# Patient Record
Sex: Female | Born: 1950
Health system: Southern US, Community
[De-identification: ages and names within clinical notes are randomized; demographics above are authoritative.]

## PROBLEM LIST (undated history)

## (undated) DIAGNOSIS — D369 Benign neoplasm, unspecified site: Secondary | ICD-10-CM

## (undated) DIAGNOSIS — E785 Hyperlipidemia, unspecified: Secondary | ICD-10-CM

## (undated) DIAGNOSIS — I428 Other cardiomyopathies: Secondary | ICD-10-CM

## (undated) DIAGNOSIS — Z95810 Presence of automatic (implantable) cardiac defibrillator: Secondary | ICD-10-CM

## (undated) DIAGNOSIS — I10 Essential (primary) hypertension: Secondary | ICD-10-CM

## (undated) DIAGNOSIS — I5022 Chronic systolic (congestive) heart failure: Secondary | ICD-10-CM

## (undated) DIAGNOSIS — Z950 Presence of cardiac pacemaker: Secondary | ICD-10-CM

## (undated) DIAGNOSIS — Z860100 Personal history of colon polyps, unspecified: Secondary | ICD-10-CM

## (undated) DIAGNOSIS — I509 Heart failure, unspecified: Secondary | ICD-10-CM

## (undated) DIAGNOSIS — K635 Polyp of colon: Secondary | ICD-10-CM

## (undated) DIAGNOSIS — M109 Gout, unspecified: Secondary | ICD-10-CM

## (undated) DIAGNOSIS — I447 Left bundle-branch block, unspecified: Secondary | ICD-10-CM

## (undated) DIAGNOSIS — R7303 Prediabetes: Secondary | ICD-10-CM

## (undated) DIAGNOSIS — T7840XA Allergy, unspecified, initial encounter: Secondary | ICD-10-CM

## (undated) HISTORY — DX: Hyperlipidemia, unspecified: E78.5

## (undated) HISTORY — DX: Other cardiomyopathies: I42.8

## (undated) HISTORY — DX: Allergy, unspecified, initial encounter: T78.40XA

## (undated) HISTORY — DX: Personal history of colonic polyps: Z86.010

## (undated) HISTORY — DX: Heart failure, unspecified: I50.9

## (undated) HISTORY — DX: Prediabetes: R73.03

## (undated) HISTORY — DX: Benign neoplasm, unspecified site: D36.9

## (undated) HISTORY — DX: Personal history of colon polyps, unspecified: Z86.0100

## (undated) HISTORY — DX: Polyp of colon: K63.5

## (undated) HISTORY — DX: Chronic systolic (congestive) heart failure: I50.22

## (undated) HISTORY — DX: Left bundle-branch block, unspecified: I44.7

---

## 1979-01-30 HISTORY — PX: PARTIAL HYSTERECTOMY: SHX80

## 1997-05-31 DIAGNOSIS — D369 Benign neoplasm, unspecified site: Secondary | ICD-10-CM

## 1997-05-31 HISTORY — DX: Benign neoplasm, unspecified site: D36.9

## 1998-05-19 ENCOUNTER — Other Ambulatory Visit: Admission: RE | Admit: 1998-05-19 | Discharge: 1998-05-19 | Payer: Self-pay | Admitting: Obstetrics and Gynecology

## 1998-07-10 ENCOUNTER — Encounter: Payer: Self-pay | Admitting: Internal Medicine

## 1998-07-10 ENCOUNTER — Ambulatory Visit (HOSPITAL_COMMUNITY): Admission: RE | Admit: 1998-07-10 | Discharge: 1998-07-10 | Payer: Self-pay | Admitting: Internal Medicine

## 1998-07-17 ENCOUNTER — Ambulatory Visit (HOSPITAL_COMMUNITY): Admission: RE | Admit: 1998-07-17 | Discharge: 1998-07-17 | Payer: Self-pay | Admitting: Gastroenterology

## 1999-07-14 ENCOUNTER — Encounter: Payer: Self-pay | Admitting: Obstetrics and Gynecology

## 1999-07-14 ENCOUNTER — Ambulatory Visit (HOSPITAL_COMMUNITY): Admission: RE | Admit: 1999-07-14 | Discharge: 1999-07-14 | Payer: Self-pay | Admitting: Obstetrics and Gynecology

## 1999-07-14 ENCOUNTER — Other Ambulatory Visit: Admission: RE | Admit: 1999-07-14 | Discharge: 1999-07-14 | Payer: Self-pay | Admitting: Obstetrics and Gynecology

## 2000-07-15 ENCOUNTER — Other Ambulatory Visit: Admission: RE | Admit: 2000-07-15 | Discharge: 2000-07-15 | Payer: Self-pay | Admitting: Obstetrics and Gynecology

## 2000-07-15 ENCOUNTER — Encounter: Payer: Self-pay | Admitting: Internal Medicine

## 2000-07-15 ENCOUNTER — Ambulatory Visit (HOSPITAL_COMMUNITY): Admission: RE | Admit: 2000-07-15 | Discharge: 2000-07-15 | Payer: Self-pay | Admitting: Internal Medicine

## 2001-07-18 ENCOUNTER — Other Ambulatory Visit: Admission: RE | Admit: 2001-07-18 | Discharge: 2001-07-18 | Payer: Self-pay | Admitting: Obstetrics and Gynecology

## 2001-07-21 ENCOUNTER — Encounter: Payer: Self-pay | Admitting: Internal Medicine

## 2001-07-21 ENCOUNTER — Ambulatory Visit (HOSPITAL_COMMUNITY): Admission: RE | Admit: 2001-07-21 | Discharge: 2001-07-21 | Payer: Self-pay | Admitting: Internal Medicine

## 2001-10-03 ENCOUNTER — Ambulatory Visit (HOSPITAL_COMMUNITY): Admission: RE | Admit: 2001-10-03 | Discharge: 2001-10-03 | Payer: Self-pay | Admitting: Gastroenterology

## 2002-08-07 ENCOUNTER — Ambulatory Visit (HOSPITAL_COMMUNITY): Admission: RE | Admit: 2002-08-07 | Discharge: 2002-08-07 | Payer: Self-pay | Admitting: Internal Medicine

## 2002-08-07 ENCOUNTER — Encounter: Payer: Self-pay | Admitting: Internal Medicine

## 2003-08-09 ENCOUNTER — Ambulatory Visit (HOSPITAL_COMMUNITY): Admission: RE | Admit: 2003-08-09 | Discharge: 2003-08-09 | Payer: Self-pay | Admitting: Internal Medicine

## 2004-06-29 ENCOUNTER — Ambulatory Visit (HOSPITAL_COMMUNITY): Admission: RE | Admit: 2004-06-29 | Discharge: 2004-06-29 | Payer: Self-pay | Admitting: Gastroenterology

## 2004-09-11 ENCOUNTER — Ambulatory Visit (HOSPITAL_COMMUNITY): Admission: RE | Admit: 2004-09-11 | Discharge: 2004-09-11 | Payer: Self-pay | Admitting: Internal Medicine

## 2005-09-14 ENCOUNTER — Ambulatory Visit (HOSPITAL_COMMUNITY): Admission: RE | Admit: 2005-09-14 | Discharge: 2005-09-14 | Payer: Self-pay | Admitting: Internal Medicine

## 2006-10-04 ENCOUNTER — Ambulatory Visit (HOSPITAL_COMMUNITY): Admission: RE | Admit: 2006-10-04 | Discharge: 2006-10-04 | Payer: Self-pay | Admitting: Internal Medicine

## 2007-10-30 ENCOUNTER — Ambulatory Visit (HOSPITAL_COMMUNITY): Admission: RE | Admit: 2007-10-30 | Discharge: 2007-10-30 | Payer: Self-pay | Admitting: Internal Medicine

## 2008-11-04 ENCOUNTER — Ambulatory Visit (HOSPITAL_COMMUNITY): Admission: RE | Admit: 2008-11-04 | Discharge: 2008-11-04 | Payer: Self-pay | Admitting: Internal Medicine

## 2009-11-24 ENCOUNTER — Ambulatory Visit (HOSPITAL_COMMUNITY): Admission: RE | Admit: 2009-11-24 | Discharge: 2009-11-24 | Payer: Self-pay | Admitting: Internal Medicine

## 2010-10-16 NOTE — Op Note (Signed)
Cynthia Roth, Cynthia Roth             ACCOUNT NO.:  [id number]   MEDICAL RECORD NO.:  [id number]          PATIENT TYPE:  AMB   LOCATION:  ENDO                         FACILITY:  [facility]   PHYSICIAN:  Anselmo Rod, M.D.  DATE OF BIRTH:  [birth year]   DATE OF PROCEDURE:  06/29/2004  DATE OF DISCHARGE:                                 OPERATIVE REPORT   PROCEDURE PERFORMED:  Screening colonoscopy.   ENDOSCOPIST:  Anselmo Rod, M.D.   INSTRUMENT USED:  Olympus video colonoscope.   INDICATIONS FOR PROCEDURE:  A 60 year old, African-American female  undergoing a screening colonoscopy.  The patient has a personal history of  colonic polyps, and has had rectal bleeding off and on in the past.  She  also has a family history of colon cancer.  Rule out polyps, masses, etc.   PRE-PROCEDURE PREPARATION:  Informed consent was procured from the patient.  The patient was fasted for eight hours prior to the procedure and prepped  with a bottle of magnesium citrate and a gallon of GoLYTELY the night prior  to the procedure.   PRE-PROCEDURE PHYSICAL:  VITAL SIGNS:  The patient had stable vital signs.  NECK:  Supple.  CHEST:  Clear to auscultation.  CARDIOVASCULAR:  S1, S2 regular.  ABDOMEN:  Soft, with normal bowel sounds.   DESCRIPTION OF THE PROCEDURE:  The patient was placed in the left lateral  decubitus position, sedated with 75 mg of Demerol and 7.5 mg of Versed in  slow incremental doses.  Once the patient was adequately sedated and  maintained on low-flow oxygen and continuous cardiac monitoring, the Olympus  video colonoscope was advanced from the rectum to the cecum.  The  appendiceal orifice and the ileocecal valve were clearly visualized and  photographed.  The patient had an excellent prep.  No masses, polyps,  erosions, ulcerations, or diverticula were seen.  Retroflexion in the rectum  revealed no abnormalities.  The patient tolerated the procedure well,  without immediate  complication.   IMPRESSION:  Normal colonoscopy.  No masses, polyps, or diverticula seen.   RECOMMENDATIONS:  1.  Continue a high-fiber diet, with liberal fluid intake.  2.  Repeat colonoscopy in the next five years unless the patient develops      any abnormal symptoms in the interim.  3.  Outpatient followup as need arises in the future.      JNM/MEDQ  D:  06/30/2004  T:  06/30/2004  Job:  [id number]   cc:   Merlene Laughter. Renae Gloss, M.D.  [address]  [address]  [place]  [place] [address]  Fax: [phone]

## 2010-10-16 NOTE — Procedures (Signed)
[facility]  Patient:    Cynthia Roth, CENICEROS Visit Number: [id number] MRN: [id number]          Service Type: END Location: ENDO Attending Physician:  Charna Elizabeth Dictated by:   Anselmo Rod, M.D. Proc. Date: 10/03/01 Admit Date:  10/03/2001   CC:         Cala Bradford R. Renae Gloss, M.D.   Procedure Report  DATE OF BIRTH:  1950-11-13.  REFERRING PHYSICIAN:  Merlene Laughter. Renae Gloss, M.D.  PROCEDURE PERFORMED:  Colonoscopy.  ENDOSCOPIST:  Anselmo Rod, M.D.  INSTRUMENT USED:  Olympus video colonoscope.  INDICATIONS FOR PROCEDURE:  The patient is a 60 year old African-American female undergoing repeat colonoscopy because of previous history of colonic polyps (tubular adenoma removed in 1999).  PREPROCEDURE PREPARATION:  Informed consent was procured from the patient. The patient was fasted for eight hours prior to the procedure and prepped with a bottle of magnesium citrate and a gallon of NuLytely the night prior to the procedure.  PREPROCEDURE PHYSICAL:  The patient had stable vital signs.  Neck supple. Chest clear to auscultation.  S1, S2 regular.  Abdomen soft with normal bowel sounds.  DESCRIPTION OF PROCEDURE:  The patient was placed in the left lateral decubitus position and sedated with 80 mg of Demerol and 8 mg of Versed intravenously.  Once the patient was adequately sedated and maintained on low-flow oxygen and continuous cardiac monitoring, the Olympus video colonoscope was advanced from the rectum to the cecum without difficulty. No masses, polyps, erosions, ulcerations or diverticula were seen.  The appendiceal orifice and ileocecal valve were clearly visualized and photographed.  The patient tolerated the procedure well without complication.  IMPRESSION:  Normal colonoscopy.  RECOMMENDATIONS: 1. Repeat colorectal cancer screening is recommended in the next five years    considering the patients personal history of colonic  polyps. 2. If the patient were to develop any abnormal GI symptoms in the interim,    she is to return to the office immediately. 3. A high fiber diet has been recommended. 4. Avoidance of nonsteroidals has been encouraged.Dictated by:   Anselmo Rod, M.D. Attending Physician:  Charna Elizabeth DD:  10/03/01 TD:  10/04/01 Job: [id number]

## 2010-12-15 ENCOUNTER — Other Ambulatory Visit (HOSPITAL_COMMUNITY): Payer: Self-pay | Admitting: Internal Medicine

## 2010-12-15 DIAGNOSIS — Z1231 Encounter for screening mammogram for malignant neoplasm of breast: Secondary | ICD-10-CM

## 2010-12-28 ENCOUNTER — Ambulatory Visit (HOSPITAL_COMMUNITY)
Admission: RE | Admit: 2010-12-28 | Discharge: 2010-12-28 | Disposition: A | Discharge status: Home / Self Care | Payer: BC Managed Care – PPO | Source: Ambulatory Visit | Attending: Internal Medicine | Admitting: Internal Medicine

## 2010-12-28 DIAGNOSIS — Z1231 Encounter for screening mammogram for malignant neoplasm of breast: Secondary | ICD-10-CM | POA: Insufficient documentation

## 2011-11-04 ENCOUNTER — Other Ambulatory Visit (HOSPITAL_COMMUNITY): Payer: Self-pay | Admitting: Internal Medicine

## 2011-11-04 DIAGNOSIS — Z1231 Encounter for screening mammogram for malignant neoplasm of breast: Secondary | ICD-10-CM

## 2011-11-29 ENCOUNTER — Ambulatory Visit (INDEPENDENT_AMBULATORY_CARE_PROVIDER_SITE_OTHER): Payer: BC Managed Care – PPO | Admitting: Internal Medicine

## 2011-11-29 ENCOUNTER — Encounter (HOSPITAL_COMMUNITY): Payer: Self-pay | Admitting: *Deleted

## 2011-11-29 ENCOUNTER — Ambulatory Visit: Payer: BC Managed Care – PPO

## 2011-11-29 ENCOUNTER — Inpatient Hospital Stay (HOSPITAL_COMMUNITY)
Admission: EM | Admit: 2011-11-29 | Discharge: 2011-12-03 | DRG: 124 | Disposition: A | Discharge status: Home / Self Care | Payer: BC Managed Care – PPO | Source: Ambulatory Visit | Attending: Internal Medicine | Admitting: Internal Medicine

## 2011-11-29 VITALS — BP 132/87 | HR 103 | Temp 98.1°F | Resp 18 | Ht 63.0 in | Wt 218.8 lb

## 2011-11-29 DIAGNOSIS — R3589 Other polyuria: Secondary | ICD-10-CM

## 2011-11-29 DIAGNOSIS — E785 Hyperlipidemia, unspecified: Secondary | ICD-10-CM | POA: Diagnosis present

## 2011-11-29 DIAGNOSIS — R059 Cough, unspecified: Secondary | ICD-10-CM

## 2011-11-29 DIAGNOSIS — R0602 Shortness of breath: Secondary | ICD-10-CM

## 2011-11-29 DIAGNOSIS — J811 Chronic pulmonary edema: Secondary | ICD-10-CM

## 2011-11-29 DIAGNOSIS — I10 Essential (primary) hypertension: Secondary | ICD-10-CM | POA: Diagnosis present

## 2011-11-29 DIAGNOSIS — I5021 Acute systolic (congestive) heart failure: Principal | ICD-10-CM | POA: Diagnosis present

## 2011-11-29 DIAGNOSIS — I509 Heart failure, unspecified: Secondary | ICD-10-CM | POA: Diagnosis present

## 2011-11-29 DIAGNOSIS — I447 Left bundle-branch block, unspecified: Secondary | ICD-10-CM | POA: Diagnosis present

## 2011-11-29 DIAGNOSIS — R05 Cough: Secondary | ICD-10-CM

## 2011-11-29 DIAGNOSIS — R358 Other polyuria: Secondary | ICD-10-CM

## 2011-11-29 DIAGNOSIS — Z79899 Other long term (current) drug therapy: Secondary | ICD-10-CM

## 2011-11-29 HISTORY — DX: Essential (primary) hypertension: I10

## 2011-11-29 LAB — CBC
HCT: 39.9 % (ref 36.0–46.0)
Hemoglobin: 13.5 g/dL (ref 12.0–15.0)
MCH: 30.5 pg (ref 26.0–34.0)
MCHC: 33.8 g/dL (ref 30.0–36.0)
MCV: 90.3 fL (ref 78.0–100.0)
Platelets: 326 10*3/uL (ref 150–400)
RBC: 4.42 MIL/uL (ref 3.87–5.11)
RDW: 14 % (ref 11.5–15.5)
WBC: 6 10*3/uL (ref 4.0–10.5)

## 2011-11-29 LAB — URINALYSIS, ROUTINE W REFLEX MICROSCOPIC
Bilirubin Urine: NEGATIVE
Glucose, UA: NEGATIVE mg/dL
Ketones, ur: NEGATIVE mg/dL
Leukocytes, UA: NEGATIVE
Nitrite: NEGATIVE
Protein, ur: NEGATIVE mg/dL
Specific Gravity, Urine: 1.008 (ref 1.005–1.030)
Urobilinogen, UA: 0.2 mg/dL (ref 0.0–1.0)
pH: 7 (ref 5.0–8.0)

## 2011-11-29 LAB — POCT UA - MICROSCOPIC ONLY
Casts, Ur, LPF, POC: NEGATIVE
Crystals, Ur, HPF, POC: NEGATIVE
Yeast, UA: NEGATIVE

## 2011-11-29 LAB — BASIC METABOLIC PANEL
BUN: 11 mg/dL (ref 6–23)
CO2: 26 mEq/L (ref 19–32)
Calcium: 9.2 mg/dL (ref 8.4–10.5)
Chloride: 104 mEq/L (ref 96–112)
Creatinine, Ser: 0.76 mg/dL (ref 0.50–1.10)
GFR calc Af Amer: >90 mL/min (ref >90)
GFR calc non Af Amer: 90 mL/min — ABNORMAL LOW (ref >90)
Glucose, Bld: 92 mg/dL (ref 70–99)
Potassium: 3.5 mEq/L (ref 3.5–5.1)
Sodium: 143 mEq/L (ref 135–145)

## 2011-11-29 LAB — URINE MICROSCOPIC-ADD ON

## 2011-11-29 LAB — COMPREHENSIVE METABOLIC PANEL
ALT: 41 U/L — ABNORMAL HIGH (ref 0–35)
AST: 25 U/L (ref 0–37)
Albumin: 3.3 g/dL — ABNORMAL LOW (ref 3.5–5.2)
Alkaline Phosphatase: 56 U/L (ref 39–117)
BUN: 11 mg/dL (ref 6–23)
CO2: 23 mEq/L (ref 19–32)
Calcium: 9.2 mg/dL (ref 8.4–10.5)
Chloride: 105 mEq/L (ref 96–112)
Creatinine, Ser: 0.72 mg/dL (ref 0.50–1.10)
GFR calc Af Amer: >90 mL/min (ref >90)
GFR calc non Af Amer: >90 mL/min (ref >90)
Glucose, Bld: 106 mg/dL — ABNORMAL HIGH (ref 70–99)
Potassium: 3.7 mEq/L (ref 3.5–5.1)
Sodium: 140 mEq/L (ref 135–145)
Total Bilirubin: 1.1 mg/dL (ref 0.3–1.2)
Total Protein: 6.3 g/dL (ref 6.0–8.3)

## 2011-11-29 LAB — POCT URINALYSIS DIPSTICK
Glucose, UA: NEGATIVE
Ketones, UA: NEGATIVE
Leukocytes, UA: NEGATIVE
Nitrite, UA: NEGATIVE
Protein, UA: 100
Spec Grav, UA: 1.025
Urobilinogen, UA: 1
pH, UA: 6.5

## 2011-11-29 LAB — POCT I-STAT TROPONIN I: Troponin i, poc: 0.03 ng/mL (ref 0.00–0.08)

## 2011-11-29 LAB — PRO B NATRIURETIC PEPTIDE: Pro B Natriuretic peptide (BNP): 3968 pg/mL — ABNORMAL HIGH (ref 0–125)

## 2011-11-29 MED ORDER — FUROSEMIDE 10 MG/ML IJ SOLN
40.0000 mg | Freq: Once | INTRAMUSCULAR | Status: AC
  Administered 2011-11-29: 40 mg via INTRAVENOUS
  Filled 2011-11-29: qty 4

## 2011-11-29 MED ORDER — ZOLPIDEM TARTRATE 5 MG PO TABS: 5.0000 mg | ORAL_TABLET | Freq: Every evening | ORAL | Status: DC | PRN

## 2011-11-29 MED ORDER — CARVEDILOL 3.125 MG PO TABS
3.1250 mg | ORAL_TABLET | Freq: Two times a day (BID) | ORAL | Status: DC
  Administered 2011-11-30 – 2011-12-03 (×8): 3.125 mg via ORAL
  Filled 2011-11-29 (×11): qty 1

## 2011-11-29 MED ORDER — FUROSEMIDE 10 MG/ML IJ SOLN
40.0000 mg | Freq: Two times a day (BID) | INTRAMUSCULAR | Status: DC
  Administered 2011-11-30 – 2011-12-01 (×3): 40 mg via INTRAVENOUS
  Filled 2011-11-29 (×5): qty 4

## 2011-11-29 MED ORDER — DIPHENHYDRAMINE HCL 25 MG PO CAPS: 25.0000 mg | ORAL_CAPSULE | Freq: Four times a day (QID) | ORAL | Status: DC | PRN

## 2011-11-29 MED ORDER — ASPIRIN EC 81 MG PO TBEC
81.0000 mg | DELAYED_RELEASE_TABLET | Freq: Every day | ORAL | Status: DC
  Administered 2011-11-30 – 2011-12-03 (×4): 81 mg via ORAL
  Filled 2011-11-29 (×5): qty 1

## 2011-11-29 NOTE — ED Provider Notes (Signed)
History     CSN: [id number]  Arrival date & time 11/29/11  1348   First MD Initiated Contact with Patient 11/29/11 1350      Chief Complaint  Patient presents with  . Shortness of Breath    (Consider location/radiation/quality/duration/timing/severity/associated sxs/prior treatment) HPI Comments: Patient is a 61 year old female with a history of hypertension that presents emergency department from urgent care for further evaluation and admission of likely new onset congestive heart failure diagnosis.  Chief complaint is gradually worsening shortness of breath that began about 3 weeks ago that became severe last evening.  Patient reports an episode of PND and complains of new leg swelling bilaterally, orthopnea, dyspnea on exertion.  She denies cough, fever, night sweats, chills, chest pain, abdominal pain, headache, lightheadedness, syncope, sore throat.  Patient did not have a cardiologist.  PCP is Dr. Renae [facility].  Patient is a 61 y.o. female presenting with shortness of breath. The history is provided by the patient.  Shortness of Breath  Associated symptoms include shortness of breath.    Past Medical History  Diagnosis Date  . Hypertension     History reviewed. No pertinent past surgical history.  No family history on file.  History  Substance Use Topics  . Smoking status: Never Smoker   . Smokeless tobacco: Not on file  . Alcohol Use: Not on file    OB History    Grav Para Term Preterm Abortions TAB SAB Ect Mult Living                  Review of Systems  Respiratory: Positive for shortness of breath.   All other systems reviewed and are negative.    Allergies  Review of patient's allergies indicates no known allergies.  Home Medications   Current Outpatient Rx  Name Route Sig Dispense Refill  . AMLODIPINE BESYLATE 5 MG PO TABS Oral Take 5 mg by mouth daily.    [name] VITAMIN C 100 MG PO TABS Oral Take 100 mg by mouth daily.    [name] CALCIUM  CARBONATE-VITAMIN D 250-125 MG-UNIT PO TABS Oral Take 1 tablet by mouth daily.    [name] ESOMEPRAZOLE MAGNESIUM 40 MG PO CPDR Oral Take 40 mg by mouth daily before breakfast.    . MULTI-VITAMIN/MINERALS PO TABS Oral Take 1 tablet by mouth daily.      BP 141/100  Temp 98 F (36.7 C) (Oral)  Resp 34  SpO2 95%  Physical Exam  Nursing note and vitals reviewed. Constitutional: She is oriented to person, place, and time. She appears well-developed and well-nourished. No distress.       Sitting comfortably in NAD, non diaphoretic  HENT:  Head: Normocephalic and atraumatic.  Mouth/Throat: Oropharynx is clear and moist. No oropharyngeal exudate.  Eyes: Conjunctivae and EOM are normal. Pupils are equal, round, and reactive to light. No scleral icterus.  Neck: Normal range of motion. Neck supple. No tracheal deviation present. No thyromegaly present.       No carotid bruits or stridor, JVD present   Cardiovascular: Regular rhythm, normal heart sounds and intact distal pulses.        And is still 2+ pitting edema bilaterally, strong distal pulses, tachycardic, no aberrancy on auscultation  Pulmonary/Chest: Breath sounds normal. No stridor. No respiratory distress. She has no wheezes.       Mild tachypnea, no accessory muscle use.  Lungs clear to auscultation bilaterally.  Abdominal: Soft.  Musculoskeletal: Normal range of motion. She exhibits no edema  and no tenderness.  Neurological: She is alert and oriented to person, place, and time. Coordination normal.  Skin: Skin is warm and dry. No rash noted. She is not diaphoretic. No erythema. No pallor.  Psychiatric: She has a normal mood and affect. Her behavior is normal.    ED Course  Procedures (including critical care time)   Labs Reviewed  PRO B NATRIURETIC PEPTIDE  CBC  COMPREHENSIVE METABOLIC PANEL  URINALYSIS, ROUTINE W REFLEX MICROSCOPIC   Dg Chest 2 View  11/29/2011  *RADIOLOGY REPORT*  Clinical Data: Short of breath.  Tachycardia.   CHEST - 2 VIEW  Comparison: None.  Findings: Cardiomegaly.  Interstitial and mild basilar alveolar pulmonary edema is present.  Small right pleural effusion is present.  No gross consolidation.  Bilateral basilar atelectasis. Mediastinal contours appear within normal limits.  IMPRESSION: Mild to moderate CHF.  Clinically significant discrepancy from primary report, if provided: None  Original Report Authenticated By: Andreas Newport, M.D.     No diagnosis found.   Date: 11/29/2011  Rate: 110  Rhythm: sinus tachycardia  QRS Axis: left  Intervals: normal  ST/T Wave abnormalities: indeterminate minimal ST depression  Conduction Disutrbances:left bundle branch block NEW  Narrative Interpretation:   Old EKG Reviewed: changes noted    MDM  New onset CHF & new LBBB  Pt to be admitted to unassigned cardiology for evaluation of new onset CHF & LBBB. 40IV lasix givenThe patient appears reasonably stabilized for admission considering the current resources, flow, and capabilities available in the ED at this time, and I doubt any other [facility] requiring further screening and/or treatment in the ED prior to admission.         Jaci Carrel, [place] 11/29/11 1548

## 2011-11-29 NOTE — ED Notes (Signed)
Dr. Ross at bedside

## 2011-11-29 NOTE — Consult Note (Signed)
Patient ID: Cynthia Roth MRN: [id number], DOB/AGE: March 08, 1951   Admit date: 11/29/2011 Date of Consult: @TODAY @  Primary Physician: Alva Garnet., MD Primary Cardiologist: New    Problem List: Past Medical History  Diagnosis Date  . Hypertension     History reviewed. No pertinent past surgical history.   Allergies: No Known Allergies  HPI:  Patient is a 61 year old who came to the ER for evaluation of SOB and palpitations.  Symptoms worsening over the past 1 tp 1 1/2 months.  Saw Primary MD  Got breathing treatment.  GI said had acid reflux. Notes coughing, PND, SOB at rest.  No palpitations.  No presyncope or syncope.  Was on 5 amlodipine and nexium at home.  Labs recently drawn at Dr. Kenna Gilbert office   TSH is normal at 2.68.  Patient with normal CBC and CMET as well (11/11/11)  Inpatient Medications:    . furosemide  40 mg Intravenous Once    No family history on file.   History   Social History  . Marital Status: Divorced    Spouse Name: N/A    Number of Children: N/A  . Years of Education: N/A   Occupational History  . Not on file.   Social History Main Topics  . Smoking status: Never Smoker   . Smokeless tobacco: Not on file  . Alcohol Use: Not on file  . Drug Use: Not on file  . Sexually Active: Not on file   Other Topics Concern  . Not on file   Social History Narrative  . No narrative on file     Review of Systems: General: Positve for chills. Fever Positive for night sweats Wt increasing. Cardiovascular: As above.  Respiratory: NOnproductive cough.   Urologic: negative for hematuria Abdominal: Nauseated  Food feels like getting stuck. Neurologic: negative for visual changes, syncope, or dizziness All other systems reviewed and are otherwise negative except as noted above.  Physical Exam: Filed Vitals:   11/29/11 1415  BP: 136/84  Pulse: 108  Temp:   Resp: 33    Intake/Output Summary (Last 24 hours) at 11/29/11 1652 Last data  filed at 11/29/11 1509  Gross per 24 hour  Intake      0 ml  Output    500 ml  Net   -500 ml    General: Well developed, well nourished, in no acute distress. Head: Normocephalic, atraumatic, sclera non-icteric Neck: Negative for carotid bruits. JVP mildly increased at 10 Lungs: Clear bilaterally to auscultation without wheezes, rales, or rhonchi. Breathing is unlabored. Heart: RRR with S1 S2. No murmurs, rubs, or gallops appreciated. Abdomen: Soft, non-tender, non-distended with normoactive bowel sounds. No hepatomegaly. No rebound/guarding. No obvious abdominal masses. Msk:  Strength and tone appears normal for age. Extremities: No clubbing, cyanosis  1+ edema Distal pedal pulses are 2+ and equal bilaterally. Neuro: Alert and oriented X 3. Moves all extremities spontaneously. Psych:  Responds to questions appropriately with a normal affect.  Labs: Results for orders placed during the hospital encounter of 11/29/11 (from the past 24 hour(s))  PRO B NATRIURETIC PEPTIDE     Status: Abnormal   Collection Time   11/29/11  2:31 PM      Component Value Range   Pro B Natriuretic peptide (BNP) 3968.0 (*) 0 - 125 pg/mL  CBC     Status: Normal   Collection Time   11/29/11  2:33 PM      Component Value Range   WBC 6.0  4.0 - 10.5 K/uL   RBC 4.42  3.87 - 5.11 MIL/uL   Hemoglobin 13.5  12.0 - 15.0 g/dL   HCT 40.9  81.1 - 91.4 %   MCV 90.3  78.0 - 100.0 fL   MCH 30.5  26.0 - 34.0 pg   MCHC 33.8  30.0 - 36.0 g/dL   RDW 78.2  95.6 - 21.3 %   Platelets 326  150 - 400 K/uL  COMPREHENSIVE METABOLIC PANEL     Status: Abnormal   Collection Time   11/29/11  2:33 PM      Component Value Range   Sodium 140  135 - 145 mEq/L   Potassium 3.7  3.5 - 5.1 mEq/L   Chloride 105  96 - 112 mEq/L   CO2 23  19 - 32 mEq/L   Glucose, Bld 106 (*) 70 - 99 mg/dL   BUN 11  6 - 23 mg/dL   Creatinine, Ser 0.86  0.50 - 1.10 mg/dL   Calcium 9.2  8.4 - 57.8 mg/dL   Total Protein 6.3  6.0 - 8.3 g/dL   Albumin 3.3 (*)  3.5 - 5.2 g/dL   AST 25  0 - 37 U/L   ALT 41 (*) 0 - 35 U/L   Alkaline Phosphatase 56  39 - 117 U/L   Total Bilirubin 1.1  0.3 - 1.2 mg/dL   GFR calc non Af Amer >90  >90 mL/min   GFR calc Af Amer >90  >90 mL/min  POCT I-STAT TROPONIN I     Status: Normal   Collection Time   11/29/11  2:57 PM      Component Value Range   Troponin i, poc 0.03  0.00 - 0.08 ng/mL   Comment 3           URINALYSIS, ROUTINE W REFLEX MICROSCOPIC     Status: Abnormal   Collection Time   11/29/11  3:09 PM      Component Value Range   Color, Urine YELLOW  YELLOW   APPearance CLEAR  CLEAR   Specific Gravity, Urine 1.008  1.005 - 1.030   pH 7.0  5.0 - 8.0   Glucose, UA NEGATIVE  NEGATIVE mg/dL   Hgb urine dipstick SMALL (*) NEGATIVE   Bilirubin Urine NEGATIVE  NEGATIVE   Ketones, ur NEGATIVE  NEGATIVE mg/dL   Protein, ur NEGATIVE  NEGATIVE mg/dL   Urobilinogen, UA 0.2  0.0 - 1.0 mg/dL   Nitrite NEGATIVE  NEGATIVE   Leukocytes, UA NEGATIVE  NEGATIVE  URINE MICROSCOPIC-ADD ON     Status: Normal   Collection Time   11/29/11  3:09 PM      Component Value Range   Squamous Epithelial / LPF RARE  RARE   WBC, UA 0-2  <3 WBC/hpf   RBC / HPF 0-2  <3 RBC/hpf   Bacteria, UA RARE  RARE    Radiology/Studies: Dg Chest 2 View  11/29/2011  *RADIOLOGY REPORT*  Clinical Data: Short of breath.  Tachycardia.  CHEST - 2 VIEW  Comparison: None.  Findings: Cardiomegaly.  Interstitial and mild basilar alveolar pulmonary edema is present.  Small right pleural effusion is present.  No gross consolidation.  Bilateral basilar atelectasis. Mediastinal contours appear within normal limits.  IMPRESSION: Mild to moderate CHF.  Clinically significant discrepancy from primary report, if provided: None  Original Report Authenticated By: Andreas Newport, M.D.    EKG:  Sinus rhythm.  LBBB. Occasional PVC.  ASSESSMENT AND PLAN:  Patient is a  61 year old with ahistory of HTN that is usually well controlled.  Presents with several weeks of  progressive SOB and edema.  On exam evidines of mild volume increase.  CXR with CHF.  Plan:  Admit.  Diurese with IV lasix.   Get echo in AM.  Consider L heart cath if LVEF is down. Add Coreg.  Add Lisinopril if bp tolerates TSH was normal a couple wks ago.  2.  HTN  FOllow.    3.  HCM  Check lipids in AM   Signed, Dietrich Pates 11/29/2011, 4:52 PM \

## 2011-11-29 NOTE — Progress Notes (Signed)
  Subjective:    Patient ID: Cynthia Roth, female    DOB: 16-Nov-1950, 61 y.o.   MRN: [id number]  Gastrophageal Reflux She complains of coughing. She reports no wheezing. Associated symptoms include fatigue.  she thinks her symptoms are because she has run out of her nexium and she requests a refill SEVERAL DAYS UNABLE TO LYE FLAT IN BED SHORT OF BREATH AT NIGHT SOB ON EXERTION COUGH NO SPUTUM HAS NOT SLEPT FOR DAYS REQUEST SLEEPING MED NO CHEST PAIN EXERTIONAL FATIGUE WEAKNESS RECENT DX OF REFLUX AND RAN OUT OF HER NEXIUM IS SCHEDULED FOR ENDOSCOPY IN ONE MONTH Was taking a lot of aspirin in the past none today   Review of Systems  Constitutional: Positive for appetite change and fatigue.  HENT: Negative.   Eyes: Negative.   Respiratory: Positive for cough and shortness of breath. Negative for chest tightness and wheezing.   Cardiovascular: Positive for palpitations.  Gastrointestinal: Negative.   Genitourinary: Negative.   Musculoskeletal: Negative.   Skin: Negative.   Neurological: Negative.   Hematological: Negative.   Psychiatric/Behavioral: Negative.   All other systems reviewed and are negative.       Objective:   Physical Exam  Nursing note and vitals reviewed. Constitutional: She is oriented to person, place, and time. She appears well-developed and well-nourished. She appears distressed.  HENT:  Head: Normocephalic and atraumatic.  Right Ear: External ear normal.  Left Ear: External ear normal.  Nose: Nose normal.  Mouth/Throat: Oropharynx is clear and moist.  Eyes: Conjunctivae and EOM are normal. Pupils are equal, round, and reactive to light.  Neck: Normal range of motion. JVD present.  Cardiovascular: Exam reveals gallop.        RAPID RATE AND RYTHM  Pulmonary/Chest: No stridor. She is in respiratory distress. She has rales.  Abdominal: Soft. Bowel sounds are normal.  Musculoskeletal: Normal range of motion. She exhibits edema.  Neurological:  She is alert and oriented to person, place, and time. She has normal reflexes.  Skin: Skin is warm and dry.  Psychiatric: She has a normal mood and affect. Her behavior is normal. Judgment and thought content normal.   Hr 103 at rest  [facility] reading (PRIMARY) by  Dr. Mindi Junker. Enlarged heart chf     Assessment & Plan:  PT IS SHORT OF BREATH ON EXERTION AND AT REST WITH PALPITATIONS. PT has a hx of  HTN NEED EVAL FO R CARDIOMYOPATHY  WILL OBTAIN CHEST XRAY EKG H/H GLUCOSE Pt has results of labs in her drs office from 6/16. H/H and glucose and electrolytes are normal  cxr enlarged heart chf /ekg lbbb /gave pt asa 81 mg times four and transported to er by ems with access and o2 in place.

## 2011-11-29 NOTE — ED Provider Notes (Signed)
Medical screening examination/treatment/procedure(s) were conducted as a shared visit with non-physician practitioner(s) and myself.  I personally evaluated the patient during the encounter  Gradually worsening SOB x 3 weeks with PND, orthopnea, leg swelling.  Bibasilar crackles, +2 pitting edema.  New LBBB.  No chest pain.  Glynn Octave, MD 11/29/11 [phone]

## 2011-11-29 NOTE — ED Notes (Signed)
Dr Tenny Craw paged to find out status of bed request.

## 2011-11-29 NOTE — ED Notes (Addendum)
C/o worsening SOB on exertion x 2-3 weeks. Sent here from an urgent care for CHF seen on X ray today & EKG changes. Given ASA 324mg  at office.

## 2011-11-29 NOTE — Progress Notes (Signed)
IV placement attempt x 1 unsuccessful. EMS arrived prior to further attempts.

## 2011-11-29 NOTE — Patient Instructions (Signed)
To er by ems 

## 2011-11-29 NOTE — ED Notes (Addendum)
C/o SOB on exertion, orthopnea x 2-3 weeks. Noticed BLE edema today. Denies any CP. Prod cough for yellow sputum x 3 weeks. Denies fever.

## 2011-11-30 ENCOUNTER — Encounter (HOSPITAL_COMMUNITY): Payer: Self-pay | Admitting: Anesthesiology

## 2011-11-30 DIAGNOSIS — I059 Rheumatic mitral valve disease, unspecified: Secondary | ICD-10-CM

## 2011-11-30 LAB — PRO B NATRIURETIC PEPTIDE: Pro B Natriuretic peptide (BNP): 3464 pg/mL — ABNORMAL HIGH (ref 0–125)

## 2011-11-30 LAB — BASIC METABOLIC PANEL
BUN: 14 mg/dL (ref 6–23)
CO2: 29 mEq/L (ref 19–32)
Calcium: 9.2 mg/dL (ref 8.4–10.5)
Chloride: 102 mEq/L (ref 96–112)
Creatinine, Ser: 0.85 mg/dL (ref 0.50–1.10)
GFR calc Af Amer: 85 mL/min — ABNORMAL LOW (ref >90)
GFR calc non Af Amer: 73 mL/min — ABNORMAL LOW (ref >90)
Glucose, Bld: 100 mg/dL — ABNORMAL HIGH (ref 70–99)
Potassium: 3.6 mEq/L (ref 3.5–5.1)
Sodium: 140 mEq/L (ref 135–145)

## 2011-11-30 LAB — TSH: TSH: 2.404 u[IU]/mL (ref 0.350–4.500)

## 2011-11-30 MED ORDER — LISINOPRIL 5 MG PO TABS
5.0000 mg | ORAL_TABLET | Freq: Every day | ORAL | Status: DC
  Administered 2011-11-30: 5 mg via ORAL
  Filled 2011-11-30 (×3): qty 1

## 2011-11-30 MED ORDER — LIVING BETTER WITH HEART FAILURE BOOK
Freq: Once | Status: DC
  Filled 2011-11-30: qty 1

## 2011-11-30 MED ORDER — POTASSIUM CHLORIDE 20 MEQ PO PACK: 40.0000 meq | PACK | Freq: Once | ORAL | Status: DC

## 2011-11-30 MED ORDER — POTASSIUM CHLORIDE CRYS ER 20 MEQ PO TBCR
40.0000 meq | EXTENDED_RELEASE_TABLET | Freq: Once | ORAL | Status: AC
  Administered 2011-11-30: 40 meq via ORAL
  Filled 2011-11-30 (×2): qty 2

## 2011-11-30 MED ORDER — POTASSIUM CHLORIDE CRYS ER 20 MEQ PO TBCR
40.0000 meq | EXTENDED_RELEASE_TABLET | Freq: Once | ORAL | Status: AC
  Administered 2011-11-30: 40 meq via ORAL

## 2011-11-30 NOTE — Progress Notes (Signed)
  Echocardiogram 2D Echocardiogram has been performed.  Cynthia Roth, Cynthia Roth 11/30/2011, 11:45 AM

## 2011-11-30 NOTE — Progress Notes (Signed)
Patient ID: Cynthia Roth, female   DOB: 04/09/1951, 61 y.o.   MRN: [id number]    SUBJECTIVE: Breathing much better this morning with diuresis.      [name] a stronger pump book   Does not apply Once  . aspirin EC  81 mg Oral Daily  . carvedilol  3.125 mg Oral BID WC  . furosemide  40 mg Intravenous Once  . furosemide  40 mg Intravenous BID  . lisinopril  5 mg Oral Daily  . potassium chloride  40 mEq Oral Once  . potassium chloride  40 mEq Oral Once  . DISCONTD: potassium chloride  40 mEq Oral Once      Filed Vitals:   11/30/11 0030 11/30/11 0200 11/30/11 0400 11/30/11 0815  BP: 108/68 111/72 117/82 117/76  Pulse: 94 95 99 99  Temp:   97.5 F (36.4 C)   TempSrc:   Oral   Resp: 28 30    Height:   5' 4.5" (1.638 m)   Weight:   97 kg (213 lb 13.5 oz)   SpO2: 88% 96% 99%     Intake/Output Summary (Last 24 hours) at 11/30/11 1057 Last data filed at 11/29/11 1946  Gross per 24 hour  Intake      0 ml  Output   1950 ml  Net  -1950 ml    LABS: Basic Metabolic Panel:  Basename 11/29/11 1808 11/29/11 1433  NA 143 140  K 3.5 3.7  CL 104 105  CO2 26 23  GLUCOSE 92 106*  BUN 11 11  CREATININE 0.76 0.72  CALCIUM 9.2 9.2  MG -- --  PHOS -- --   Liver Function Tests:  Basename 11/29/11 1433  AST 25  ALT 41*  ALKPHOS 56  BILITOT 1.1  PROT 6.3  ALBUMIN 3.3*   No results found for this basename: LIPASE:2,AMYLASE:2 in the last 72 hours CBC:  Basename 11/29/11 1433  WBC 6.0  NEUTROABS --  HGB 13.5  HCT 39.9  MCV 90.3  PLT 326   RADIOLOGY: Dg Chest 2 View  11/29/2011  *RADIOLOGY REPORT*  Clinical Data: Short of breath.  Tachycardia.  CHEST - 2 VIEW  Comparison: None.  Findings: Cardiomegaly.  Interstitial and mild basilar alveolar pulmonary edema is present.  Small right pleural effusion is present.  No gross consolidation.  Bilateral basilar atelectasis. Mediastinal contours appear within normal limits.  IMPRESSION: Mild to moderate CHF.  Clinically significant  discrepancy from primary report, if provided: None  Original Report Authenticated By: Andreas Newport, M.D.    PHYSICAL EXAM General: NAD Neck: JVP 10-11 cm, no thyromegaly or thyroid nodule.  Lungs: Clear to auscultation bilaterally with normal respiratory effort. CV: Nondisplaced PMI.  Heart regular S1/S2, no S3/S4, no murmur.  1+ ankle edema.  No carotid bruit.  Normal pedal pulses.  Abdomen: Soft, nontender, no hepatosplenomegaly, no distention.  Neurologic: Alert and oriented x 3.  Psych: Normal affect. Extremities: No clubbing or cyanosis.   TELEMETRY: Reviewed telemetry pt in NSR:  ASSESSMENT AND PLAN:  61 yo with minimal past history presented with acute CHF.  This was gradual in onset over 3-4 weeks.  No chest pain.  No prior cardiac history.  She has a LBBB on ECG.  She diuresed well with Lasix IV but is still volume overloaded.  - Continue IV Lasix - Echo today. - Continue low dose Coreg, add lisinopril 5 mg daily.  - TSH, SPEP.  - Further recs when echo seen, may need cath.  Marca Ancona 11/30/2011 11:01 AM

## 2011-11-30 NOTE — Progress Notes (Signed)
Utilization Review Completed.Cynthia Roth T7/07/2011

## 2011-11-30 NOTE — Progress Notes (Signed)
MD Communication:  Pharmacy Re:  DVT prophylaxis  Assessment: 61yo female admitted with shortness of breath and palpitations and acute CHF.    Plan:  - I have added SCD's to her regimen  - Please consider risk factors and add Lovenox/SQ Heparin if you deem that she is a candidate.   Nadara Mustard, PharmD., MS Clinical Pharmacist Pager:  [phone]  Thank you for allowing pharmacy to be part of this patients care team.  11/30/2011,4:30 PM

## 2011-12-01 LAB — BASIC METABOLIC PANEL
BUN: 16 mg/dL (ref 6–23)
CO2: 26 mEq/L (ref 19–32)
Calcium: 9 mg/dL (ref 8.4–10.5)
Chloride: 106 mEq/L (ref 96–112)
Creatinine, Ser: 0.77 mg/dL (ref 0.50–1.10)
GFR calc Af Amer: >90 mL/min (ref >90)
GFR calc non Af Amer: 89 mL/min — ABNORMAL LOW (ref >90)
Glucose, Bld: 92 mg/dL (ref 70–99)
Potassium: 3.9 mEq/L (ref 3.5–5.1)
Sodium: 142 mEq/L (ref 135–145)

## 2011-12-01 LAB — LIPID PANEL
Cholesterol: 168 mg/dL (ref 0–200)
HDL: 28 mg/dL — ABNORMAL LOW (ref >39)
LDL Cholesterol: 125 mg/dL — ABNORMAL HIGH (ref 0–99)
Total CHOL/HDL Ratio: 6 RATIO
Triglycerides: 73 mg/dL (ref <150)
VLDL: 15 mg/dL (ref 0–40)

## 2011-12-01 MED ORDER — ATORVASTATIN CALCIUM 20 MG PO TABS
20.0000 mg | ORAL_TABLET | Freq: Every day | ORAL | Status: DC
  Administered 2011-12-01 – 2011-12-03 (×3): 20 mg via ORAL
  Filled 2011-12-01 (×3): qty 1

## 2011-12-01 MED ORDER — ENOXAPARIN SODIUM 40 MG/0.4ML ~~LOC~~ SOLN
40.0000 mg | SUBCUTANEOUS | Status: DC
  Administered 2011-12-01 – 2011-12-03 (×3): 40 mg via SUBCUTANEOUS
  Filled 2011-12-01 (×3): qty 0.4

## 2011-12-01 MED ORDER — SPIRONOLACTONE 12.5 MG HALF TABLET
12.5000 mg | ORAL_TABLET | Freq: Every day | ORAL | Status: DC
  Administered 2011-12-01 – 2011-12-02 (×2): 12.5 mg via ORAL
  Filled 2011-12-01 (×3): qty 1

## 2011-12-01 MED ORDER — POTASSIUM CHLORIDE CRYS ER 20 MEQ PO TBCR
20.0000 meq | EXTENDED_RELEASE_TABLET | Freq: Once | ORAL | Status: AC
  Administered 2011-12-01: 20 meq via ORAL
  Filled 2011-12-01: qty 1

## 2011-12-01 MED ORDER — FUROSEMIDE 10 MG/ML IJ SOLN
40.0000 mg | Freq: Three times a day (TID) | INTRAMUSCULAR | Status: DC
  Administered 2011-12-01 – 2011-12-02 (×2): 40 mg via INTRAVENOUS
  Filled 2011-12-01 (×6): qty 4

## 2011-12-01 MED ORDER — LISINOPRIL 5 MG PO TABS
5.0000 mg | ORAL_TABLET | Freq: Two times a day (BID) | ORAL | Status: DC
  Administered 2011-12-01 – 2011-12-03 (×5): 5 mg via ORAL
  Filled 2011-12-01 (×6): qty 1

## 2011-12-01 NOTE — Progress Notes (Signed)
Patient ID: Cynthia Roth, female   DOB: 16-Dec-1950, 61 y.o.   MRN: [id number]     SUBJECTIVE: Breathing and orthopnea improved but diuresis not as vigorous yesterday.      [name] a stronger pump book   Does not apply Once  . aspirin EC  81 mg Oral Daily  . atorvastatin  20 mg Oral q1800  . carvedilol  3.125 mg Oral BID WC  . enoxaparin (LOVENOX) injection  40 mg Subcutaneous Q24H  . furosemide  40 mg Intravenous Q8H  . lisinopril  5 mg Oral BID  . potassium chloride  20 mEq Oral Once  . potassium chloride  40 mEq Oral Once  . potassium chloride  40 mEq Oral Once  . spironolactone  12.5 mg Oral Daily  . DISCONTD: furosemide  40 mg Intravenous BID  . DISCONTD: lisinopril  5 mg Oral Daily  . DISCONTD: potassium chloride  40 mEq Oral Once  . DISCONTD: potassium chloride  40 mEq Oral Once      Filed Vitals:   11/30/11 1343 11/30/11 1636 11/30/11 2100 12/01/11 0500  BP: 101/68 105/74 99/66 106/73  Pulse: 95 90 86 81  Temp: 98.3 F (36.8 C)  97.9 F (36.6 C) 97.9 F (36.6 C)  TempSrc:      Resp:   20 20  Height:      Weight:      SpO2: 96%  98% 100%    Intake/Output Summary (Last 24 hours) at 12/01/11 0835 Last data filed at 11/30/11 1300  Gross per 24 hour  Intake      0 ml  Output    800 ml  Net   -800 ml    LABS: Basic Metabolic Panel:  Basename 12/01/11 0500 11/30/11 1200  NA 142 140  K 3.9 3.6  CL 106 102  CO2 26 29  GLUCOSE 92 100*  BUN 16 14  CREATININE 0.77 0.85  CALCIUM 9.0 9.2  MG -- --  PHOS -- --   Liver Function Tests:  Basename 11/29/11 1433  AST 25  ALT 41*  ALKPHOS 56  BILITOT 1.1  PROT 6.3  ALBUMIN 3.3*   No results found for this basename: LIPASE:2,AMYLASE:2 in the last 72 hours CBC:  Basename 11/29/11 1433  WBC 6.0  NEUTROABS --  HGB 13.5  HCT 39.9  MCV 90.3  PLT 326   RADIOLOGY: Dg Chest 2 View  11/29/2011  *RADIOLOGY REPORT*  Clinical Data: Short of breath.  Tachycardia.  CHEST - 2 VIEW  Comparison: None.  Findings:  Cardiomegaly.  Interstitial and mild basilar alveolar pulmonary edema is present.  Small right pleural effusion is present.  No gross consolidation.  Bilateral basilar atelectasis. Mediastinal contours appear within normal limits.  IMPRESSION: Mild to moderate CHF.  Clinically significant discrepancy from primary report, if provided: None  Original Report Authenticated By: Andreas Newport, M.D.    PHYSICAL EXAM General: NAD Neck: JVP 9-10 cm, no thyromegaly or thyroid nodule.  Lungs: Clear to auscultation bilaterally with normal respiratory effort. CV: Nondisplaced PMI.  Heart regular S1/S2, no S3/S4, no murmur.  Trace ankle edema.  No carotid bruit.  Normal pedal pulses.  Abdomen: Soft, nontender, no hepatosplenomegaly, no distention.  Neurologic: Alert and oriented x 3.  Psych: Normal affect. Extremities: No clubbing or cyanosis.   TELEMETRY: Reviewed telemetry pt in NSR:  ASSESSMENT AND PLAN:  61 yo with minimal past history presented with acute systolic CHF.  This was gradual in onset over 3-4 weeks.  No chest pain.  No prior cardiac history.  She has a LBBB on ECG.  Echo yesterday showed EF 15% with severely dilated LV and diastolic dysfunction.  Etiology uncertain: No ETOH/drug history, no known prior MI or chest pain, no significant family history, no recent viral illness, TSH normal.  She is still volume overloaded but breathing much better.  - Continue IV Lasix, change to 40 mg IV every 8 hours today.  - Continue low dose Coreg, increase lisinopril to 5 mg bid, add spironolactone 12.5 mg daily.  - SPEP sent. - Will need left and right heart cath.  She will likely be stable to go home on 40 mg po Lasix daily tomorrow.  If so, she can be discharged and followup with me in the office in 1 week. I will set up outpatient cath in [facility] lab.  If she needs or wants to stay until Friday, I can do her cath then.  Would like to get her volume optimized prior to cath, however.  - Will start  atorvastatin 20 mg daily in case coronary disease is present (LDL 125).    Marca Ancona 12/01/2011 8:35 AM

## 2011-12-02 DIAGNOSIS — I447 Left bundle-branch block, unspecified: Secondary | ICD-10-CM | POA: Diagnosis present

## 2011-12-02 DIAGNOSIS — E785 Hyperlipidemia, unspecified: Secondary | ICD-10-CM

## 2011-12-02 DIAGNOSIS — I10 Essential (primary) hypertension: Secondary | ICD-10-CM | POA: Diagnosis present

## 2011-12-02 DIAGNOSIS — R0602 Shortness of breath: Secondary | ICD-10-CM | POA: Diagnosis present

## 2011-12-02 DIAGNOSIS — I509 Heart failure, unspecified: Secondary | ICD-10-CM | POA: Diagnosis present

## 2011-12-02 DIAGNOSIS — I5022 Chronic systolic (congestive) heart failure: Secondary | ICD-10-CM

## 2011-12-02 HISTORY — DX: Left bundle-branch block, unspecified: I44.7

## 2011-12-02 HISTORY — DX: Hyperlipidemia, unspecified: E78.5

## 2011-12-02 HISTORY — DX: Chronic systolic (congestive) heart failure: I50.22

## 2011-12-02 LAB — BASIC METABOLIC PANEL
BUN: 18 mg/dL (ref 6–23)
BUN: 17 mg/dL (ref 6–23)
CO2: 28 mEq/L (ref 19–32)
CO2: 30 mEq/L (ref 19–32)
Calcium: 9.5 mg/dL (ref 8.4–10.5)
Calcium: 9.5 mg/dL (ref 8.4–10.5)
Chloride: 103 mEq/L (ref 96–112)
Chloride: 102 mEq/L (ref 96–112)
Creatinine, Ser: 0.86 mg/dL (ref 0.50–1.10)
Creatinine, Ser: 0.84 mg/dL (ref 0.50–1.10)
GFR calc Af Amer: 83 mL/min — ABNORMAL LOW (ref >90)
GFR calc Af Amer: 86 mL/min — ABNORMAL LOW (ref >90)
GFR calc non Af Amer: 72 mL/min — ABNORMAL LOW (ref >90)
GFR calc non Af Amer: 74 mL/min — ABNORMAL LOW (ref >90)
Glucose, Bld: 84 mg/dL (ref 70–99)
Glucose, Bld: 95 mg/dL (ref 70–99)
Potassium: 3.7 mEq/L (ref 3.5–5.1)
Potassium: 3.7 mEq/L (ref 3.5–5.1)
Sodium: 143 mEq/L (ref 135–145)
Sodium: 142 mEq/L (ref 135–145)

## 2011-12-02 LAB — CBC
HCT: 41.6 % (ref 36.0–46.0)
Hemoglobin: 13.9 g/dL (ref 12.0–15.0)
MCH: 30.8 pg (ref 26.0–34.0)
MCHC: 33.4 g/dL (ref 30.0–36.0)
MCV: 92 fL (ref 78.0–100.0)
Platelets: 318 10*3/uL (ref 150–400)
RBC: 4.52 MIL/uL (ref 3.87–5.11)
RDW: 14 % (ref 11.5–15.5)
WBC: 5.8 10*3/uL (ref 4.0–10.5)

## 2011-12-02 LAB — PRO B NATRIURETIC PEPTIDE: Pro B Natriuretic peptide (BNP): 794.6 pg/mL — ABNORMAL HIGH (ref 0–125)

## 2011-12-02 MED ORDER — SODIUM CHLORIDE 0.9 % IV SOLN: 250.0000 mL | INTRAVENOUS | Status: DC | PRN

## 2011-12-02 MED ORDER — ASPIRIN 81 MG PO CHEW
324.0000 mg | CHEWABLE_TABLET | ORAL | Status: AC
  Administered 2011-12-03: 324 mg via ORAL
  Filled 2011-12-02: qty 4

## 2011-12-02 MED ORDER — SODIUM CHLORIDE 0.9 % IJ SOLN
3.0000 mL | Freq: Two times a day (BID) | INTRAMUSCULAR | Status: DC
  Administered 2011-12-02: 3 mL via INTRAVENOUS

## 2011-12-02 MED ORDER — SODIUM CHLORIDE 0.9 % IJ SOLN: 3.0000 mL | INTRAMUSCULAR | Status: DC | PRN

## 2011-12-02 MED ORDER — FUROSEMIDE 40 MG PO TABS
40.0000 mg | ORAL_TABLET | Freq: Two times a day (BID) | ORAL | Status: DC
  Administered 2011-12-02 (×2): 40 mg via ORAL
  Filled 2011-12-02 (×5): qty 1

## 2011-12-02 MED ORDER — SODIUM CHLORIDE 0.9 % IV SOLN
1.0000 mL/kg/h | INTRAVENOUS | Status: DC
  Administered 2011-12-03: 1 mL/kg/h via INTRAVENOUS

## 2011-12-02 NOTE — Progress Notes (Signed)
Subjective: Patient denies SOB  No CP. Objective: Filed Vitals:   12/01/11 1902 12/01/11 1931 12/01/11 2150 12/02/11 0617  BP: 109/75 111/79 99/80 103/71  Pulse: 98 99 90 79  Temp:  97.7 F (36.5 C)  97.9 F (36.6 C)  TempSrc:  Oral  Oral  Resp:  17  16  Height:      Weight:    212 lb 11.9 oz (96.5 kg)  SpO2:  94%  99%   Weight change:   Intake/Output Summary (Last 24 hours) at 12/02/11 1610 Last data filed at 12/02/11 0617  Gross per 24 hour  Intake   1200 ml  Output   3525 ml  Net  -2325 ml    General: Alert, awake, oriented x3, in no acute distress Neck:  JVP is normal Heart: Regular rate and rhythm, without murmurs, rubs, gallops.  Lungs: Clear to auscultation.  No rales or wheezes. Exemities:  No edema.   Neuro: Grossly intact, nonfocal.  Tele:  SR.    Lab Results: Results for orders placed during the hospital encounter of 11/29/11 (from the past 24 hour(s))  BASIC METABOLIC PANEL     Status: Abnormal   Collection Time   12/02/11  5:32 AM      Component Value Range   Sodium 143  135 - 145 mEq/L   Potassium 3.7  3.5 - 5.1 mEq/L   Chloride 103  96 - 112 mEq/L   CO2 28  19 - 32 mEq/L   Glucose, Bld 84  70 - 99 mg/dL   BUN 18  6 - 23 mg/dL   Creatinine, Ser 9.60  0.50 - 1.10 mg/dL   Calcium 9.5  8.4 - 45.4 mg/dL   GFR calc non Af Amer 72 (*) >90 mL/min   GFR calc Af Amer 83 (*) >90 mL/min    Studies/Results: No results found.  Medications: Meds reviewed.  1. CHF  Volume status has improved from admit.  LVEF 15%.  On Coreg, lisinopril and aldactone.  Will check BNP  Switch lasix to PO  Plan on cath tomorrow to define anatomy. 2.  HTN   Follow. 3.  Spell  Patient had episode of wooziness last night.  QUestion due to diuresis  BP OK though.  None since.   Follow. 4.  Lipids  ON statin. 5.  LBBB  Dietrich Pates 12/02/2011, 8:12 AM

## 2011-12-02 NOTE — Progress Notes (Signed)
Pt had an episode of lightheadedness and mild SOB earlier in night, upon initial assessment, symptoms had not fully resolved.  Pt also complained of generalized weakness and became unsteady when walking back from bathroom. Symptoms resolved soon after pt was back in bed, but are worse with movement. No loss of consciousness or neurological deficits noted. Lungs clear bilaterally.  BP 111/79, HR 99, O2 94%. Telemetry strip showed PVC's that correlated with times of symptoms, but no other changes. Thayer Ohm B called and made aware. No orders at this time, just continue to monitor pt.

## 2011-12-03 ENCOUNTER — Encounter (HOSPITAL_COMMUNITY)
Admission: EM | Disposition: A | Discharge status: Home / Self Care | Payer: Self-pay | Source: Ambulatory Visit | Attending: Internal Medicine

## 2011-12-03 ENCOUNTER — Telehealth: Payer: Self-pay | Admitting: Cardiology

## 2011-12-03 DIAGNOSIS — I428 Other cardiomyopathies: Secondary | ICD-10-CM

## 2011-12-03 HISTORY — PX: LEFT HEART CATHETERIZATION WITH CORONARY ANGIOGRAM: SHX5451

## 2011-12-03 HISTORY — PX: CARDIAC CATHETERIZATION: SHX172

## 2011-12-03 LAB — CBC
HCT: 43.8 % (ref 36.0–46.0)
Hemoglobin: 15.1 g/dL — ABNORMAL HIGH (ref 12.0–15.0)
MCH: 31.1 pg (ref 26.0–34.0)
MCHC: 34.5 g/dL (ref 30.0–36.0)
MCV: 90.3 fL (ref 78.0–100.0)
Platelets: 335 10*3/uL (ref 150–400)
RBC: 4.85 MIL/uL (ref 3.87–5.11)
RDW: 13.6 % (ref 11.5–15.5)
WBC: 5.1 10*3/uL (ref 4.0–10.5)

## 2011-12-03 LAB — BASIC METABOLIC PANEL
BUN: 15 mg/dL (ref 6–23)
CO2: 29 mEq/L (ref 19–32)
Calcium: 9.3 mg/dL (ref 8.4–10.5)
Chloride: 103 mEq/L (ref 96–112)
Creatinine, Ser: 0.82 mg/dL (ref 0.50–1.10)
GFR calc Af Amer: 88 mL/min — ABNORMAL LOW (ref >90)
GFR calc non Af Amer: 76 mL/min — ABNORMAL LOW (ref >90)
Glucose, Bld: 97 mg/dL (ref 70–99)
Potassium: 4.5 mEq/L (ref 3.5–5.1)
Sodium: 141 mEq/L (ref 135–145)

## 2011-12-03 LAB — UIFE/LIGHT CHAINS/TP QN, 24-HR UR
Albumin, U: DETECTED
Alpha 1, Urine: NOT DETECTED
Alpha 2, Urine: NOT DETECTED
Beta, Urine: NOT DETECTED
Free Kappa Lt Chains,Ur: 0.2 mg/dL (ref 0.14–2.42)
Free Kappa/Lambda Ratio: 20 ratio — ABNORMAL HIGH (ref 2.04–10.37)
Free Lambda Lt Chains,Ur: <0.03 mg/dL (ref 0.02–0.67)
Total Protein, Urine: 1.7 mg/dL

## 2011-12-03 LAB — PROTEIN ELECTROPHORESIS, SERUM
Albumin ELP: 58.2 % (ref 55.8–66.1)
Alpha-1-Globulin: 6.2 % — ABNORMAL HIGH (ref 2.9–4.9)
Alpha-2-Globulin: 11.9 % — ABNORMAL HIGH (ref 7.1–11.8)
Beta 2: 5.5 % (ref 3.2–6.5)
Beta Globulin: 6.8 % (ref 4.7–7.2)
Gamma Globulin: 11.4 % (ref 11.1–18.8)
M-Spike, %: NOT DETECTED g/dL
Total Protein ELP: 5.7 g/dL — ABNORMAL LOW (ref 6.0–8.3)

## 2011-12-03 LAB — CREATININE, SERUM
Creatinine, Ser: 0.68 mg/dL (ref 0.50–1.10)
GFR calc Af Amer: >90 mL/min (ref >90)
GFR calc non Af Amer: >90 mL/min (ref >90)

## 2011-12-03 LAB — PROTIME-INR
INR: 1.15 (ref 0.00–1.49)
Prothrombin Time: 14.9 seconds (ref 11.6–15.2)

## 2011-12-03 SURGERY — LEFT HEART CATHETERIZATION WITH CORONARY ANGIOGRAM
Anesthesia: LOCAL

## 2011-12-03 MED ORDER — NITROGLYCERIN 0.2 MG/ML ON CALL CATH LAB
INTRAVENOUS | Status: AC
  Filled 2011-12-03: qty 1

## 2011-12-03 MED ORDER — FUROSEMIDE 40 MG PO TABS
40.0000 mg | ORAL_TABLET | Freq: Every day | ORAL | Status: DC
  Administered 2011-12-03: 40 mg via ORAL
  Filled 2011-12-03: qty 1

## 2011-12-03 MED ORDER — VERAPAMIL HCL 2.5 MG/ML IV SOLN
INTRAVENOUS | Status: AC
  Filled 2011-12-03: qty 2

## 2011-12-03 MED ORDER — HEPARIN (PORCINE) IN NACL 2-0.9 UNIT/ML-% IJ SOLN
INTRAMUSCULAR | Status: AC
  Filled 2011-12-03: qty 2000

## 2011-12-03 MED ORDER — SPIRONOLACTONE 25 MG PO TABS
25.0000 mg | ORAL_TABLET | Freq: Every day | ORAL | Status: DC
  Administered 2011-12-03: 25 mg via ORAL
  Filled 2011-12-03: qty 1

## 2011-12-03 MED ORDER — CARVEDILOL 3.125 MG PO TABS: 3.1250 mg | ORAL_TABLET | Freq: Two times a day (BID) | ORAL | Status: DC

## 2011-12-03 MED ORDER — ONDANSETRON HCL 4 MG/2ML IJ SOLN: 4.0000 mg | Freq: Four times a day (QID) | INTRAMUSCULAR | Status: DC | PRN

## 2011-12-03 MED ORDER — FENTANYL CITRATE 0.05 MG/ML IJ SOLN
INTRAMUSCULAR | Status: AC
  Filled 2011-12-03: qty 2

## 2011-12-03 MED ORDER — HEPARIN SODIUM (PORCINE) 5000 UNIT/ML IJ SOLN
5000.0000 [IU] | Freq: Three times a day (TID) | INTRAMUSCULAR | Status: DC
  Filled 2011-12-03: qty 1

## 2011-12-03 MED ORDER — ASPIRIN 81 MG PO TBEC: 81.0000 mg | DELAYED_RELEASE_TABLET | Freq: Every day | ORAL | Status: AC

## 2011-12-03 MED ORDER — FUROSEMIDE 40 MG PO TABS: 40.0000 mg | ORAL_TABLET | Freq: Every day | ORAL | Status: DC

## 2011-12-03 MED ORDER — ATORVASTATIN CALCIUM 20 MG PO TABS: 20.0000 mg | ORAL_TABLET | Freq: Every day | ORAL | Status: DC

## 2011-12-03 MED ORDER — LISINOPRIL 5 MG PO TABS: 5.0000 mg | ORAL_TABLET | Freq: Two times a day (BID) | ORAL | Status: DC

## 2011-12-03 MED ORDER — ACETAMINOPHEN 325 MG PO TABS: 650.0000 mg | ORAL_TABLET | ORAL | Status: DC | PRN

## 2011-12-03 MED ORDER — SPIRONOLACTONE 25 MG PO TABS: 25.0000 mg | ORAL_TABLET | Freq: Every day | ORAL | Status: DC

## 2011-12-03 MED ORDER — MIDAZOLAM HCL 2 MG/2ML IJ SOLN
INTRAMUSCULAR | Status: AC
  Filled 2011-12-03: qty 2

## 2011-12-03 MED ORDER — HEPARIN SODIUM (PORCINE) 1000 UNIT/ML IJ SOLN
INTRAMUSCULAR | Status: AC
  Filled 2011-12-03: qty 1

## 2011-12-03 MED ORDER — SODIUM CHLORIDE 0.9 % IJ SOLN: 3.0000 mL | Freq: Two times a day (BID) | INTRAMUSCULAR | Status: DC

## 2011-12-03 MED ORDER — LIDOCAINE HCL (PF) 1 % IJ SOLN
INTRAMUSCULAR | Status: AC
  Filled 2011-12-03: qty 30

## 2011-12-03 MED ORDER — SODIUM CHLORIDE 0.9 % IJ SOLN: 3.0000 mL | INTRAMUSCULAR | Status: DC | PRN

## 2011-12-03 NOTE — Progress Notes (Signed)
Patient ID: Cynthia Roth, female   DOB: 1950/08/27, 61 y.o.   MRN: [id number]     SUBJECTIVE: No complaints today.  Tolerating meds.      [name] a stronger pump book   Does not apply Once  . aspirin  324 mg Oral Pre-Cath  . aspirin EC  81 mg Oral Daily  . atorvastatin  20 mg Oral q1800  . carvedilol  3.125 mg Oral BID WC  . enoxaparin (LOVENOX) injection  40 mg Subcutaneous Q24H  . furosemide  40 mg Oral Daily  . lisinopril  5 mg Oral BID  . sodium chloride  3 mL Intravenous Q12H  . spironolactone  25 mg Oral Daily  . DISCONTD: furosemide  40 mg Intravenous Q8H  . DISCONTD: furosemide  40 mg Oral BID  . DISCONTD: spironolactone  12.5 mg Oral Daily      Filed Vitals:   12/02/11 2119 12/03/11 0001 12/03/11 0400 12/03/11 0500  BP: 101/68  103/57   Pulse:  88 80   Temp:  97.4 F (36.3 C) 98 F (36.7 C)   TempSrc:  Oral Oral   Resp:  16 16   Height:      Weight:    92.9 kg (204 lb 12.9 oz)  SpO2:   100%     Intake/Output Summary (Last 24 hours) at 12/03/11 0729 Last data filed at 12/02/11 2120  Gross per 24 hour  Intake    963 ml  Output    200 ml  Net    763 ml    LABS: Basic Metabolic Panel:  Basename 12/02/11 0932 12/02/11 0532  NA 142 143  K 3.7 3.7  CL 102 103  CO2 30 28  GLUCOSE 95 84  BUN 17 18  CREATININE 0.84 0.86  CALCIUM 9.5 9.5  MG -- --  PHOS -- --   Liver Function Tests: No results found for this basename: AST:2,ALT:2,ALKPHOS:2,BILITOT:2,PROT:2,ALBUMIN:2 in the last 72 hours No results found for this basename: LIPASE:2,AMYLASE:2 in the last 72 hours CBC:  Basename 12/02/11 0932  WBC 5.8  NEUTROABS --  HGB 13.9  HCT 41.6  MCV 92.0  PLT 318   RADIOLOGY: Dg Chest 2 View  11/29/2011  *RADIOLOGY REPORT*  Clinical Data: Short of breath.  Tachycardia.  CHEST - 2 VIEW  Comparison: None.  Findings: Cardiomegaly.  Interstitial and mild basilar alveolar pulmonary edema is present.  Small right pleural effusion is present.  No gross  consolidation.  Bilateral basilar atelectasis. Mediastinal contours appear within normal limits.  IMPRESSION: Mild to moderate CHF.  Clinically significant discrepancy from primary report, if provided: None  Original Report Authenticated By: Andreas Newport, M.D.    PHYSICAL EXAM General: NAD Neck: JVP 7 cm, no thyromegaly or thyroid nodule.  Lungs: Clear to auscultation bilaterally with normal respiratory effort. CV: Nondisplaced PMI.  Heart regular S1/S2, no S3/S4, no murmur.  No edema.  No carotid bruit.  Normal pedal pulses.  Abdomen: Soft, nontender, no hepatosplenomegaly, no distention.  Neurologic: Alert and oriented x 3.  Psych: Normal affect. Extremities: No clubbing or cyanosis.   TELEMETRY: Reviewed telemetry pt in NSR:  ASSESSMENT AND PLAN:  61 yo with minimal past history presented with acute systolic CHF.  This was gradual in onset over 3-4 weeks.  No chest pain.  No prior cardiac history.  She has a LBBB on ECG.  Echo showed EF 15% with severely dilated LV and diastolic dysfunction.  Etiology uncertain: No ETOH/drug history, no known prior  MI or chest pain, no significant family history, no recent viral illness, TSH normal.  Volume now improved, she is on po Lasix.  - Change Lasix to 40 mg po daily. - Continue low dose Coreg and current lisinopril. - Increase spironolactone to 25 mg daily.  - SPEP sent. - LHC today to assess for CAD.  If no intervention, she could potentially go home this evening. However, she does not think there will be anyone to stay with her tonight so may need to wait to discharge until the morning.   Marca Ancona 12/03/2011 7:29 AM

## 2011-12-03 NOTE — Telephone Encounter (Signed)
New problem:  Per Bjorn Loser PA, 7 days transition care. Patient has appt on 7/15 @ 8:30 with Tereso Newcomer.

## 2011-12-03 NOTE — Progress Notes (Signed)
Rhonda Barrett PA notified of orthostatic BP. Pt had no symptoms during orthos. Rhonda asked for the patient to drink one cup of water and if pt had no symptoms of lightheadedness or dizziness pt could be d/c. Pt given a cup of water which she did drink. Pt denies having any symptoms. Family at bedside. Will continue to monitor.

## 2011-12-03 NOTE — Progress Notes (Signed)
Pt given discharge instructions with understanding. Pt has no questions at this time and denies any pain or feeling of discomfort. Pt encouraged to weigh self daily and check BP on a regular basis. Family at bedside. Pt assisted by staff to car via wheelchair.

## 2011-12-03 NOTE — Progress Notes (Signed)
Upon discontinuing patient's iv's, patient stated she was feeling a little warm.  When initially asked if she was light headed or dizzy while ambulating in the hallway, she stated no.  Took patient's blood pressure while sitting on the side of the bed and BP 77/44.  Patient placed back in bed and in supine position for 15 minutes, recheck BP 76/49.  Theodore Demark PA on floor and made aware.  Instructed by PA to have patient drink 2 cups of water 15 minutes apart, then to check a set of orthostatics.  Information will be reported to [facility] and to notify PA of orthostatics prior to discharge.  Family at bedside and made aware.  Colman Cater

## 2011-12-03 NOTE — Discharge Summary (Signed)
CARDIOLOGY DISCHARGE SUMMARY   Patient ID: Cynthia Roth MRN: [id number] DOB/AGE: March 23, 1951 61 y.o.  Admit date: 11/29/2011 Discharge date: 12/03/2011  Primary Discharge Diagnosis:   CHF (congestive heart failure) Secondary Discharge Diagnosis:  Active Problems:  SOB (shortness of breath)  HTN (hypertension)  Dyslipidemia  LBBB (left bundle branch block)  Procedures: Left Heart Cath, Selective Coronary Angiography, 2D Echocardiogram  Hospital Course: Cynthia Roth is a 61 year old female with no previous history of coronary artery disease. She had chest pain and palpitations. She came to the hospital where she was admitted for further evaluation and treatment.  Her troponin I was negative for MI. Her BNP was elevated at 3464. She was diuresed with IV Lasix and lost 9 pounds during her hospital stay. As her volume status improved, her respiratory status improved. An echocardiogram was performed which showed an EF of 15% with both systolic and diastolic dysfunction. She was started on appropriate medications including aspirin, beta blocker and an ACE inhibitor. Low doses were used because her blood pressure would not tolerate anything higher. She had a left bundle branch block on her ECG which was not previously known. She also had PVCs on telemetry. She became hypokalemic with diuresis and her potassium required extensive supplementation which was done.  Once her respiratory condition stabilized, plans were made for cardiac catheterization to further assess the cause of her cardiomyopathy. She was taken to the cath lab on 12/03/2011, the results are listed below. She has no coronary artery disease. The etiology of her cardiomyopathy remains uncertain as it is nonischemic and no clear cause has been found on interview or by testing. She is now euvolemic. Post cath, she received her carvedilol. Later, she had problems with hypotension. Her SBP was dropped into the 70s when she tried to walk.  She had been NPO all day and had no hydration. She was able to take PO fluids, and her BP improved into the 90s. She then was able to ambulate and and did well. She is considered stable for discharge, in improved condition, to follow up as an outpatient.   Labs:   Lab Results  Component Value Date   WBC 5.8 12/02/2011   HGB 13.9 12/02/2011   HCT 41.6 12/02/2011   MCV 92.0 12/02/2011   PLT 318 12/02/2011    Lab 12/03/11 0822 11/29/11 1433  NA 141 --  K 4.5 --  CL 103 --  CO2 29 --  BUN 15 --  CREATININE 0.82 --  CALCIUM 9.3 --  PROT -- 6.3  BILITOT -- 1.1  ALKPHOS -- 56  ALT -- 41*  AST -- 25  GLUCOSE 97 --   Lipid Panel     Component Value Date/Time   CHOL 168 12/01/2011 0500   TRIG 73 12/01/2011 0500   HDL 28* 12/01/2011 0500   CHOLHDL 6.0 12/01/2011 0500   VLDL 15 12/01/2011 0500   LDLCALC 125* 12/01/2011 0500    Pro B Natriuretic peptide (BNP)  Date/Time Value Range Status  12/02/2011  9:40 AM 794.6* 0 - 125 pg/mL Final  11/29/2011 11:49 PM 3464.0* 0 - 125 pg/mL Final    Basename 12/03/11 0500  INR 1.15      Radiology: Dg Chest 2 View 11/29/2011  *RADIOLOGY REPORT*  Clinical Data: Short of breath.  Tachycardia.  CHEST - 2 VIEW  Comparison: None.  Findings: Cardiomegaly.  Interstitial and mild basilar alveolar pulmonary edema is present.  Small right pleural effusion is present.  No gross consolidation.  Bilateral basilar atelectasis. Mediastinal contours appear within normal limits.  IMPRESSION: Mild to moderate CHF.  Clinically significant discrepancy from primary report, if provided: None  Original Report Authenticated By: Andreas Newport, M.D.    Cardiac Cath: 12/03/2011 Left mainstem: Short, no angiographic CAD.  Left anterior descending (LAD): No angiographic CAD. There was a segment of myocardial bridging in the mid LAD.  Left circumflex (LCx): No angiographic CAD.  Right coronary artery (RCA): No angiographic CAD.  Left ventriculography: Not done, echo this admission with dilated  cardiomyopathy.  Final Conclusions: No angiographic CAD, nonischemic cardiomyopathy.   EKG: 03-Dec-2011 04:06:01  Normal sinus rhythm Non-specific intra-ventricular conduction block Left atrial enlargement Rightward axis Non-specific ST-t changes No significant change since last tracing 11/29/11 75mm/s 13mm/mV 100Hz  8.0.1 12SL 241 CID: 1 Referred by: Dietrich Pates MD Confirmed By: Viann Fish MD Vent. rate 83 BPM PR interval 170 ms QRS duration 150 ms QT/QTc 426/500 ms P-R-T axes 58 147 -1  Echo: 11/30/2011 Study Conclusions  - Left ventricle: The cavity size was severely dilated. Wall thickness was normal. The estimated ejection fraction was 15%. Diffuse hypokinesis. Doppler parameters are consistent with restrictive physiology, indicative of decreased left ventricular diastolic compliance and/or increased left atrial pressure. E/medial e' > 15 suggests LV end diastolic pressure at least 20 mmHg. - Aortic valve: There was no stenosis. Trivial regurgitation. - Mitral valve: Central mitral regurgitation likely due to dilated annulus. Moderate regurgitation. - Left atrium: The atrium was mildly to moderately dilated. - Right ventricle: The cavity size was normal. Systolic function was moderately reduced. - Right atrium: The atrium was mildly dilated. - Tricuspid valve: Peak RV-RA gradient: 31mm Hg (S). - Pulmonic valve: Severe regurgitation. - Pulmonary arteries: PA systolic pressure 37-41 mmHg. - Systemic veins: IVC measured 2.2 cm with normal respirophasic variation, suggesting RA pressure 6-10 mmHg. Impressions: - Severely dilated left ventricle with severe systolic dysfunction, EF 15% with diffuse hypokinesis. Septal-lateral dyssynchrony. Severe diastolic dysfunction with evidence for elevated LV filling pressure. Normal RV size with moderate systolic dysfunction. Moderate central MR likely from annular dilatation. Mild pulmonary hypertension.    FOLLOW UP PLANS AND  APPOINTMENTS No Known Allergies Medication List  As of 12/03/2011  8:22 PM   STOP taking these medications         amLODipine 5 MG tablet         TAKE these medications         aspirin 81 MG EC tablet   Take 1 tablet (81 mg total) by mouth daily.      calcium-vitamin D 250-125 MG-UNIT per tablet   Commonly known as: OSCAL WITH D   Take 1 tablet by mouth daily.      carvedilol 3.125 MG tablet   Commonly known as: COREG   Take 1 tablet (3.125 mg total) by mouth 2 (two) times daily with a meal.      esomeprazole 40 MG capsule   Commonly known as: NEXIUM   Take 40 mg by mouth daily before breakfast.      furosemide 40 MG tablet   Commonly known as: LASIX   Take 1 tablet (40 mg total) by mouth daily. HOLD tomorrow, restart on 12/05/2011.      lisinopril 5 MG tablet   Commonly known as: PRINIVIL,ZESTRIL   Take 1 tablet (5 mg total) by mouth 2 (two) times daily. Hold tonight and tomorrow, restart on 12/05/2011      multivitamin with minerals tablet   Take 1 tablet by mouth  daily.      spironolactone 25 MG tablet   Commonly known as: ALDACTONE   Take 1 tablet (25 mg total) by mouth daily.      vitamin C 100 MG tablet   Take 100 mg by mouth daily.            Discharge Orders    Future Appointments: Provider: Department: Dept Phone: Center:   12/08/2011 9:40 AM [facility] [employer] [facility] [phone] [facility]   12/30/2011 12:00 PM [facility] [phone]     Follow-up Information    Follow up with Marca Ancona, MD. (The office will call.)    Contact information:   [address] [place] [address] [phone]       Follow up with [facility] [address] (Blood Work on July 10th at 9:40 am)    Solicitor information:   [address] [place] [address]         BRING ALL MEDICATIONS WITH YOU TO FOLLOW UP APPOINTMENTS  Time spent with patient to include physician  time: 31 min Signed: Theodore Demark 12/03/2011, 4:03 PM Co-Sign MD

## 2011-12-03 NOTE — Progress Notes (Signed)
   CARE MANAGEMENT NOTE 12/03/2011  Patient:  GEARLENE, GODSIL   Account Number:  [id number]  Date Initiated:  12/03/2011  Documentation initiated by:  Alexander Mcauley  Subjective/Objective Assessment:   Pt is new diagnosis CHF, HHRN for CHF program needed.     Action/Plan:   Met with pt who selected [facility] for [facility] and CHF program. Pt will d/c with son to his home in [place], [place] for a few days then return to [place], [place] will begin services when pt returns home to [place].   Anticipated DC Date:  12/03/2011   Anticipated DC Plan:  HOME W HOME HEALTH SERVICES         [facility] Choice  HOME HEALTH   Choice offered to / List presented to:  C-1 Patient        HH arranged  HH-1 RN      [facility] agency  [facility]   Status of service:  Completed, signed off Medicare Important Message given?   (If response is "NO", the following Medicare IM given date fields will be blank) Date Medicare IM given:   Date Additional Medicare IM given:    Discharge Disposition:  HOME W HOME HEALTH SERVICES  Per UR Regulation:  Reviewed for med. necessity/level of care/duration of stay  If discussed at Long Length of Stay Meetings, dates discussed:    Comments:  12/03/2011 Pt is new diagnosis CHF and will receive [facility] for CHF program. Johny Shock RN MPH Case Manager [phone]

## 2011-12-03 NOTE — CV Procedure (Signed)
   Cardiac Catheterization Procedure Note  Name: Cynthia Roth MRN: [id number] DOB: 04-21-1951  Procedure: Left Heart Cath, Selective Coronary Angiography  Indication: Cardiomyopathy, assess for CAD.    Procedural Details: The right wrist was prepped, draped, and anesthetized with 1% lidocaine. Using the modified Seldinger technique, a 5 French sheath was introduced into the right radial artery. 3 mg of verapamil was administered through the sheath, weight-based unfractionated heparin was administered intravenously. Standard Judkins catheters were used for selective coronary angiography and left ventriculography. Catheter exchanges were performed over an exchange length guidewire. There were no immediate procedural complications. A TR band was used for radial hemostasis at the completion of the procedure.  The patient was transferred to the post catheterization recovery area for further monitoring.  Procedural Findings: Hemodynamics: AO 92/63 LV 92/18  Coronary angiography: Coronary dominance: right  Left mainstem: Short, no angiographic CAD.   Left anterior descending (LAD): No angiographic CAD.  There was a segment of myocardial bridging in the mid LAD.   Left circumflex (LCx): No angiographic CAD.   Right coronary artery (RCA): No angiographic CAD.   Left ventriculography: Not done, echo this admission with dilated cardiomyopathy.   Final Conclusions:  No angiographic CAD, nonischemic cardiomyopathy.   Recommendations: [name] is stable for discharge tonight as long as someone can stay with her this evening.  She will continue her current cardiac meds except can hold off on the statin given the absence of CAD.  She needs to see me in the office in the next 7-10 days with BMET prior to appointment.  I will arrange a cardiac MRI.   Marca Ancona 12/03/2011, 2:52 PM

## 2011-12-06 NOTE — Telephone Encounter (Signed)
Spoke with pt.  She is feeling much better since the hospitalization.  She has had no issues with recurrent CP.  She was able to get her new medications and did stop the norvasc.  She does have a question about the blood pressure cuff she has at home.  Her BP was reading low this morning, but she felt fine.  She has someone coming to her house today to try to help her, but if this does not work, asked her to bring it with her to her appt and we could help in the office.  She is aware of her lab appt and follow up with Dr. Shirlee Latch.

## 2011-12-08 ENCOUNTER — Ambulatory Visit (INDEPENDENT_AMBULATORY_CARE_PROVIDER_SITE_OTHER): Payer: BC Managed Care – PPO | Admitting: *Deleted

## 2011-12-08 DIAGNOSIS — I10 Essential (primary) hypertension: Secondary | ICD-10-CM

## 2011-12-08 LAB — BASIC METABOLIC PANEL
BUN: 17 mg/dL (ref 6–23)
CO2: 24 mEq/L (ref 19–32)
Calcium: 9.3 mg/dL (ref 8.4–10.5)
Chloride: 106 mEq/L (ref 96–112)
Creatinine, Ser: 0.8 mg/dL (ref 0.4–1.2)
GFR: 90.14 mL/min (ref >60.00)
Glucose, Bld: 115 mg/dL — ABNORMAL HIGH (ref 70–99)
Potassium: 4 mEq/L (ref 3.5–5.1)
Sodium: 138 mEq/L (ref 135–145)

## 2011-12-13 ENCOUNTER — Encounter: Payer: BC Managed Care – PPO | Admitting: Physician Assistant

## 2011-12-13 ENCOUNTER — Telehealth: Payer: Self-pay | Admitting: Cardiology

## 2011-12-13 NOTE — Telephone Encounter (Signed)
Walk in pt Form " pt Dropped Off [employer] Paper to be Completed" sent to [facility] for completion 12/13/11/KM

## 2011-12-14 ENCOUNTER — Encounter: Payer: Self-pay | Admitting: *Deleted

## 2011-12-17 ENCOUNTER — Ambulatory Visit (INDEPENDENT_AMBULATORY_CARE_PROVIDER_SITE_OTHER): Payer: BC Managed Care – PPO | Admitting: Cardiology

## 2011-12-17 ENCOUNTER — Encounter: Payer: Self-pay | Admitting: Cardiology

## 2011-12-17 ENCOUNTER — Telehealth: Payer: Self-pay | Admitting: Cardiology

## 2011-12-17 VITALS — BP 100/70 | HR 83 | Ht 64.0 in | Wt 199.0 lb

## 2011-12-17 DIAGNOSIS — I429 Cardiomyopathy, unspecified: Secondary | ICD-10-CM

## 2011-12-17 DIAGNOSIS — I428 Other cardiomyopathies: Secondary | ICD-10-CM

## 2011-12-17 LAB — BASIC METABOLIC PANEL
BUN: 31 mg/dL — ABNORMAL HIGH (ref 6–23)
CO2: 26 mEq/L (ref 19–32)
Calcium: 9.9 mg/dL (ref 8.4–10.5)
Chloride: 105 mEq/L (ref 96–112)
Creatinine, Ser: 1 mg/dL (ref 0.4–1.2)
GFR: 72.7 mL/min (ref >60.00)
Glucose, Bld: 102 mg/dL — ABNORMAL HIGH (ref 70–99)
Potassium: 4.9 mEq/L (ref 3.5–5.1)
Sodium: 140 mEq/L (ref 135–145)

## 2011-12-17 LAB — BRAIN NATRIURETIC PEPTIDE: Pro B Natriuretic peptide (BNP): 162 pg/mL — ABNORMAL HIGH (ref 0.0–100.0)

## 2011-12-17 MED ORDER — FUROSEMIDE 20 MG PO TABS: 20.0000 mg | ORAL_TABLET | Freq: Every day | ORAL | Status: DC

## 2011-12-17 MED ORDER — CARVEDILOL 6.25 MG PO TABS: 6.2500 mg | ORAL_TABLET | Freq: Two times a day (BID) | ORAL | Status: DC

## 2011-12-17 NOTE — Telephone Encounter (Signed)
Pt hs questions re ov today

## 2011-12-17 NOTE — Patient Instructions (Addendum)
Decrease lasix(furosemide) to 20mg  daily. You can take one-half 40mg  tablet daily and use your current supply.  Increase coreg(carvedilol) to 6.25mg  twice a day. You can take two 3.125mg  twice a day and use your current supply.  Call our office if you have persistent lightheadedness, dizziness or low blood pressure.   Your physician recommends that you have lab work today--BNP/BMET.  Your physician recommends that you schedule a follow-up appointment in: 2 weeks with Sunday Spillers.  Your physician recommends that you schedule a follow-up appointment in: 1 month with Dr Shirlee Latch.  Your physician has requested that you have an echocardiogram. Echocardiography is a painless test that uses sound waves to create images of your heart. It provides your doctor with information about the size and shape of your heart and how well your heart's chambers and valves are working. This procedure takes approximately one hour. There are no restrictions for this procedure. IN 3 MONTHS

## 2011-12-17 NOTE — Telephone Encounter (Signed)
Pt said OK to leave message on voice mail if she did not answer. LMOVM with Dr Alford Highland comments.

## 2011-12-17 NOTE — Telephone Encounter (Signed)
Unlikely, more likely it would cause allergic symptoms.

## 2011-12-17 NOTE — Progress Notes (Signed)
PCP: Dr. Alva Garnet  61 yo with history of hypertension, CHF and nonischemic cardiomyopathy presents to the office for follow up visit. Patient with well controlled HTN was recently admitted to the [facility] hospital for evaluation of acute CHF with 3-4 weeks of progressive dyspnea/orthopnea/PND and edema on 11/29/11.  No prior cardiac history/MI.  No ETOH/Drug history.  No significant family history. No recent viral illness. TSH normal.    She had LBBB on EKG and her Echo showed EF 15 % with severely dilated LV and diastolic dysfunction. Heart cath showed nonischemic cardiomyopathy.She was diuresed and discharged on lasix, ASA, Coreg, lisinopril and spirolactone.  Mr. Kensinger states that she feels much better since her discharge on 12/03/11. She does light house work like washing dishes, doing laundry and vacuuming the floor without any chest pain/dyspnea. She is able to climb one flight of stairs without dyspnea. 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 She does not exercise because " I am scared."  She reports one episode mild chest aching this morning 1-2/10 lasting 30 minutes with no chest pressure or palpitation. No radiation noted. She contributes her chest pain to her sleep deprivation. She is pain free at the office.   Patient reports good appetite and limits her fluid intake to 32 ozs daily per her discharge instruction, and she has 19 lbs drop since 11/29/11 and 5 lbs drop since her discharge on 12/03/11.  Labs: K 3.7, Creatinine 0.72, BNP 3968 11/29/11) Labs: K 3.7, Creatinine 0.84, BNP 794, LDL 125, SPEP negative, TSH normal, urine immunofixation negative (12/02/11) Labs: K 4.0, Creatinine 0.8 (12/08/11)  ROS: All systems reviewed and negative except as per HPI.   Lipid Panel     Component Value Date/Time   CHOL 168 12/01/2011 0500   TRIG 73 12/01/2011 0500   HDL 28* 12/01/2011 0500   CHOLHDL 6.0 12/01/2011 0500   VLDL 15 12/01/2011 0500   LDLCALC 125* 12/01/2011 0500   EKG: Sinus tachy, PVCs, nonspecific intraventricular conduction delay  2D echo on 11/30/11  Severely dilated left ventricle with severe systolic dysfunction, EF 15% with diffuse hypokinesis. Septal-lateral dyssynchrony. Severe diastolic dysfunction with evidence for elevated LV filling pressure. Normal RV size with moderate systolic dysfunction. Moderate central MR likely from annular dilatation. Mild pulmonary hypertension.  Cardiac Cath: 12/03/2011  Left mainstem: Short, no angiographic CAD.  Left anterior descending (LAD): No angiographic CAD. There was a segment of myocardial bridging in the mid LAD.  Left circumflex (LCx): No angiographic CAD.  Right coronary artery (RCA): No angiographic CAD.  Left ventriculography: Not done, echo this admission with dilated cardiomyopathy.  Final Conclusions: No angiographic CAD, nonischemic cardiomyopathy.   Allergies: NKDA  Past Medical History: 1. CHF and Nonischemic cardiomyopathy: patient was admitted with acute CHF on 11/29/11 and Echo showed EF 15% with diffuse hypokinesis, severely dilated LV, severe diastolic dysfunction. Left heart cath showed no angiographic CAD.  TSH, SPEP, urine immunofixation were all within normal range.  2. LBBB: noted on hospital admission on 11/29/11. No old EKG. 3. HTN 4. Dyslipidemia 5. Tubular adenoma:  Last colonoscopy in 1999. Followed by GI Dr. Loreta Ave.  Past Surgical History  Procedure Date  . Abdominal hysterectomy     partial  . Cardiac catheterization 12-03-11    selectie coronary angiography   Family History  Problem Relation Age of Onset  . Heart failure Maternal Grandmother     diagnosed in age of 55's.  still living at age of 60's.  . Colon cancer      family history  . Angina Mother     diagnosed at age of 56's, unsure whether it was a true diagnosis. unsure about the treatment    History   Social History  . Marital Status: Divorced    Spouse Name: N/A     Number of Children: N/A  . Years of Education: N/A   Occupational History  . Not on file.   Social History Main Topics  . Smoking status: Never Smoker   . Smokeless tobacco: Not on file  . Alcohol Use: No  . Drug Use: No  . Sexually Active: Not on file   Other Topics Concern  . Not on file   Social History Narrative   Lives by herself in [place]. Divorced with one adult child who lives in [place]. She has two brothers living in [place]. 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    Current Outpatient Prescriptions on File Prior to Visit  Medication Sig Dispense Refill  . Ascorbic Acid (VITAMIN C) 100 MG tablet Take 100 mg by mouth daily.      [name] aspirin EC 81 MG EC tablet Take 1 tablet (81 mg total) by mouth daily.      . carvedilol (COREG) 3.125 MG tablet Take 1 tablet (3.125 mg total) by mouth 2 (two) times daily with a meal.  60 tablet  11  . furosemide (LASIX) 40 MG tablet Take 1 tablet (40 mg total) by mouth daily. HOLD tomorrow, restart on 12/05/2011.  30 tablet  11  . lisinopril (PRINIVIL,ZESTRIL) 5 MG tablet Take 1 tablet (5 mg total) by mouth 2 (two) times daily. Hold tonight and tomorrow, restart on 12/05/2011  60 tablet  11  . Multiple Vitamins-Minerals (MULTIVITAMIN WITH MINERALS) tablet Take 1 tablet by mouth daily.      [name] spironolactone (ALDACTONE) 25 MG tablet Take 1 tablet (25 mg total) by mouth daily.  30 tablet  11   Filed Vitals:   12/17/11 1108  BP: 100/70  Pulse: 83  Height: 5\' 4"  (1.626 m)  Weight: 199 lb (90.266 kg)    General: NAD Neck: No JVD, no thyromegaly or thyroid nodule.  Lungs: Clear to auscultation bilaterally with normal respiratory effort. CV: Nondisplaced PMI.  Heart regular S1/S2, no S3/S4, no murmur.  No peripheral edema.  No carotid bruit.  Normal pedal pulses.  Abdomen: Soft, nontender, no hepatosplenomegaly, no distention.  Neurologic: Alert and oriented x 3.  Psych: Normal  affect. Extremities: No clubbing or cyanosis.  Assessment and Plan: 1. CHF and Nonischemic cardiomyopathy  Patient was admitted with acute CHF on 11/29/11 and Echo showed EF 15% with diffuse hypokinesis. Severe diastolic dysfunction. Left heart cath showed nonischemic cardiomyopathy. Clinically, She has improved since her discharge.   She has a good volume status on exam.  - low sodium diet and may start light exercise like walking as tolerated. - continue ASA, Lisinopril.  - can decrease Lasix to 20 mg po daily since she is euvolemic  - Will increase Coreg to 6.25 mg po BID and instruct patient to monitor her BP closely. If she starts to have persistent dizziness/lightheadedness, she should call back to my office.   - check BMP and BNP today - repeat Echo in 3 months and consider BiV-ICD. - follow up with  PA in 2 weeks and with Dr. Shirlee Latch in 1 month  2.  LBBB Noted on hospital admission on 11/29/11. No old EKG. Cardiac cath showed nonischemic cardiomyopathy.   - repeat EKG today >>>LAD, LBBB  3. HTN Used to be on Norvasc which was D/C'd and she is on lisinopril and Coreg. She reports that her BP runs at 90's/60's and she tolerates well.  - Patient is instructed to increase Coreg to 6.25 mg po BID and monitor her BP closely. Will call the office if persistent dizziness.  4. Dyslipidemia Goal < 70. Last LDL 125 on 12/01/11 - patient was started on Lipitor during the hospitalization. Patient described transient neck and lower extremities weakness that lasted 15 minutes on 12/02/11 before her discharge. Denies any muscle pain.   - will reinforce diet control and lifestyle change.  I have seen Mrs [place] with resident Dr. Dierdre Searles and agree with the above note.  She has a nonischemic CMP of uncertain etiology.  TSH and serum/urine immunofixations were normal.  Possible etiologies include viral myocarditis or LBBB cardiomyopathy.  As above, will increase Coreg and decrease Lasix.  I will have  her see the PA in 2 wks and me in 1 month.  She has lost weight and is feeling much better with diuresis (no orthopnea/PND, no DOE walking on flat ground).  I am going to titrate up her medications gradually.  If EF on echo in 3 months remains < 35%, she will need implantation of a CRT-D device.  QRS duration is 146 msec so I suspect that she will be a patient who will have a good response to CRT.    Marca Ancona 12/17/2011 12:47 PM

## 2011-12-17 NOTE — Telephone Encounter (Signed)
Spoke with pt. Pt states she has mold in her house. Pt  would like to know if Dr Shirlee Latch feels like that may be the cause of her current heart problem or is contributing to her current heart problem. I will forward to Dr Shirlee Latch for review.

## 2011-12-23 ENCOUNTER — Telehealth: Payer: Self-pay | Admitting: Cardiology

## 2011-12-23 NOTE — Telephone Encounter (Signed)
Patient returning nurse call she can be reached at [phone]

## 2011-12-23 NOTE — Telephone Encounter (Signed)
Spoke with pt about lab results 12/17/11.

## 2011-12-30 ENCOUNTER — Ambulatory Visit (HOSPITAL_COMMUNITY)
Admission: RE | Admit: 2011-12-30 | Discharge: 2011-12-30 | Disposition: A | Discharge status: Home / Self Care | Payer: BC Managed Care – PPO | Source: Ambulatory Visit | Attending: Internal Medicine | Admitting: Internal Medicine

## 2011-12-30 DIAGNOSIS — Z1231 Encounter for screening mammogram for malignant neoplasm of breast: Secondary | ICD-10-CM | POA: Insufficient documentation

## 2011-12-31 ENCOUNTER — Telehealth: Payer: Self-pay | Admitting: Cardiology

## 2011-12-31 NOTE — Telephone Encounter (Signed)
Spoke with pt. Pt is scheduled for colonoscopy and endoscopy by Dr Arty Baumgartner 01/10/12. She is asking if this is OK with Dr Shirlee Latch and if she should hold any medication for the procedure. I will forward to Dr Shirlee Latch for review.

## 2011-12-31 NOTE — Telephone Encounter (Signed)
Please return call to patient at wk# or cell# concerning upcoming med procedure

## 2012-01-03 NOTE — Telephone Encounter (Signed)
OK for c-scope.  No meds to hold

## 2012-01-04 NOTE — Telephone Encounter (Signed)
Pt notified of Dr McLean's recommendations 

## 2012-01-05 ENCOUNTER — Ambulatory Visit (INDEPENDENT_AMBULATORY_CARE_PROVIDER_SITE_OTHER): Payer: BC Managed Care – PPO | Admitting: Nurse Practitioner

## 2012-01-05 ENCOUNTER — Encounter: Payer: Self-pay | Admitting: Nurse Practitioner

## 2012-01-05 VITALS — BP 90/60 | HR 77 | Ht 64.0 in | Wt 200.0 lb

## 2012-01-05 DIAGNOSIS — R0602 Shortness of breath: Secondary | ICD-10-CM

## 2012-01-05 DIAGNOSIS — I10 Essential (primary) hypertension: Secondary | ICD-10-CM

## 2012-01-05 DIAGNOSIS — I428 Other cardiomyopathies: Secondary | ICD-10-CM

## 2012-01-05 DIAGNOSIS — I5022 Chronic systolic (congestive) heart failure: Secondary | ICD-10-CM | POA: Insufficient documentation

## 2012-01-05 DIAGNOSIS — I429 Cardiomyopathy, unspecified: Secondary | ICD-10-CM

## 2012-01-05 DIAGNOSIS — I5020 Unspecified systolic (congestive) heart failure: Secondary | ICD-10-CM

## 2012-01-05 LAB — BASIC METABOLIC PANEL
BUN: 15 mg/dL (ref 6–23)
CO2: 27 mEq/L (ref 19–32)
Calcium: 9.8 mg/dL (ref 8.4–10.5)
Chloride: 103 mEq/L (ref 96–112)
Creatinine, Ser: 0.8 mg/dL (ref 0.4–1.2)
GFR: 92.69 mL/min (ref >60.00)
Glucose, Bld: 86 mg/dL (ref 70–99)
Potassium: 4.1 mEq/L (ref 3.5–5.1)
Sodium: 139 mEq/L (ref 135–145)

## 2012-01-05 MED ORDER — CARVEDILOL 6.25 MG PO TABS: 9.3750 mg | ORAL_TABLET | Freq: Two times a day (BID) | ORAL | Status: DC

## 2012-01-05 MED ORDER — FUROSEMIDE 20 MG PO TABS: 20.0000 mg | ORAL_TABLET | Freq: Every day | ORAL | Status: DC | PRN

## 2012-01-05 NOTE — Assessment & Plan Note (Signed)
Patient has a nonischemic CM with EF of 15%. Etiology is unknown; her TSh and immunofixation were normal. Does have a L BBB.  She has tuned up quite nicely with medicines and is now totally asymptomatic. Still with S3 on exam. She will need repeat echo in October to reassess her EF and consider possible BiV/ICD implant. I have changed her Lasix to just "prn" and increased her Coreg to 9.375 mg BID. She is to continue to monitor her blood pressure at home. She is to call if she starts having dizzy spells. She is seeing Dr. Shirlee Latch back in 2 weeks. We will check a BMET today.

## 2012-01-05 NOTE — Patient Instructions (Addendum)
Take the Furosemide just "as needed" for swelling, weight gain over 2 to 3 pounds overnight  Increase the Coreg to 1 and 1/2 tablets two times a day  Keep weighing every day and watching your salt  Stay on all your other medicines  We will see you later this month  We are going to check lab today  Call the [facility] office at [phone] if you have any questions, problems or concerns.

## 2012-01-05 NOTE — Assessment & Plan Note (Signed)
BP is in the 90's. She is asymptomatic. She will continue to monitor at home.

## 2012-01-05 NOTE — Assessment & Plan Note (Signed)
This is now resolved with her current regimen.

## 2012-01-05 NOTE — Progress Notes (Signed)
Cynthia Roth Date of Birth: December 09, 1950 Medical Record #[id number]  History of Present Illness: Cynthia Roth is seen back today for a 2 week check. She is seen for Dr. Shirlee Latch. She has HTN, a nonischemic CM with an EF of 15% that was diagnosed back in early July. She has tuned up quite nicely. She does have a LBBB. Other problems include HLD and past history of tubular adenoma followed by Dr. Loreta Ave.   She comes in today. She is here alone. Her Lasix was cut back and her Coreg increased 2 weeks ago. She is feeling quite good. She is totally asymptomatic. Can lie flat in the bed. No swelling. Weight is slowing going down. She is doing a better job of watching her salt. She is back working. She notes that her blood pressure is a little lower at home since her medicines were changed but she has no dizziness or weakness whatsoever and seems to be tolerating her current regimen without issue. She is planning on having her follow up colonoscopy next week with Dr. Loreta Ave and tells me that Dr. Loreta Ave is aware of her current situation.   Current Outpatient Prescriptions on File Prior to Visit  Medication Sig Dispense Refill  . aspirin EC 81 MG EC tablet Take 1 tablet (81 mg total) by mouth daily.      [name] lisinopril (PRINIVIL,ZESTRIL) 5 MG tablet Take 1 tablet (5 mg total) by mouth 2 (two) times daily. Hold tonight and tomorrow, restart on 12/05/2011  60 tablet  11  . Multiple Vitamins-Minerals (MULTIVITAMIN WITH MINERALS) tablet Take 1 tablet by mouth daily.      [name] spironolactone (ALDACTONE) 25 MG tablet Take 1 tablet (25 mg total) by mouth daily.  30 tablet  11  . DISCONTD: carvedilol (COREG) 6.25 MG tablet Take 1 tablet (6.25 mg total) by mouth 2 (two) times daily.  60 tablet  6  . DISCONTD: furosemide (LASIX) 20 MG tablet Take 1 tablet (20 mg total) by mouth daily.  30 tablet  6  . Ascorbic Acid (VITAMIN C) 100 MG tablet Take 100 mg by mouth daily.        No Known Allergies  Past Medical History  Diagnosis  Date  . Hypertension   . GERD (gastroesophageal reflux disease)   . Hx of colonic polyps   . Tubular adenoma 1999    history of  . Nonischemic cardiomyopathy   . CHF (congestive heart failure) 12/02/2011  . LBBB (left bundle branch block) 12/02/2011  . Dyslipidemia 12/02/2011  . SOB (shortness of breath) 12/02/2011    Past Surgical History  Procedure Date  . Abdominal hysterectomy     partial  . Cardiac catheterization 12-03-11    selectie coronary angiography    History  Smoking status  . Never Smoker   Smokeless tobacco  . Not on file    History  Alcohol Use No    Family History  Problem Relation Age of Onset  . Heart failure Maternal Grandmother     diagnosed in age of 33's. still living at age of 64's.  . Colon cancer      family history  . Angina Mother     diagnosed at age of 53's, unsure whether it was a true diagnosis. unsure about the treatment    Review of Systems: The review of systems is per the HPI.  All other systems were reviewed and are negative.  Physical Exam: BP 90/60  Pulse 77  Ht 5\' 4"  (1.626 m)  Wt 200 lb (90.719 kg)  BMI 34.33 kg/m2 Patient is very pleasant and in no acute distress. Skin is warm and dry. Color is normal.  HEENT is unremarkable. Normocephalic/atraumatic. PERRL. Sclera are nonicteric. Neck is supple. No masses. No JVD. Lungs are clear. Cardiac exam shows a regular rate and rhythm. Abdomen is soft. Extremities are without edema. Gait and ROM are intact. No gross neurologic deficits noted.   LABORATORY DATA: BMET is pending for today.   Lab Results  Component Value Date   WBC 5.1 12/03/2011   HGB 15.1* 12/03/2011   HCT 43.8 12/03/2011   PLT 335 12/03/2011   GLUCOSE 102* 12/17/2011   CHOL 168 12/01/2011   TRIG 73 12/01/2011   HDL 28* 12/01/2011   LDLCALC 125* 12/01/2011   ALT 41* 11/29/2011   AST 25 11/29/2011   NA 140 12/17/2011   K 4.9 12/17/2011   CL 105 12/17/2011   CREATININE 1.0 12/17/2011   BUN 31* 12/17/2011   CO2 26 12/17/2011   TSH  2.404 11/30/2011   INR 1.15 12/03/2011    Assessment / Plan:

## 2012-01-10 ENCOUNTER — Encounter: Payer: Self-pay | Admitting: *Deleted

## 2012-01-10 ENCOUNTER — Telehealth: Payer: Self-pay | Admitting: *Deleted

## 2012-01-10 NOTE — Telephone Encounter (Signed)
LMTCB.  Delshon Blanchfield, CMA 

## 2012-01-10 NOTE — Telephone Encounter (Signed)
Message copied by Awilda Bill on Mon Jan 10, 2012  2:39 PM ------      Message from: Rosalio Macadamia      Created: Wed Jan 05, 2012  4:41 PM       Ok to report. Labs are satisfactory.

## 2012-01-10 NOTE — Telephone Encounter (Signed)
Message copied by Awilda Bill on Mon Jan 10, 2012  9:49 AM ------      Message from: Rosalio Macadamia      Created: Wed Jan 05, 2012  4:41 PM       Ok to report. Labs are satisfactory.

## 2012-01-10 NOTE — Telephone Encounter (Signed)
Copy of labs sent in the mail with instructions to contact if any questions.  Several msgs left with no callback.Vista Mink, CMA

## 2012-01-18 ENCOUNTER — Encounter: Payer: Self-pay | Admitting: Internal Medicine

## 2012-01-20 ENCOUNTER — Encounter: Payer: Self-pay | Admitting: Cardiology

## 2012-01-20 ENCOUNTER — Ambulatory Visit (INDEPENDENT_AMBULATORY_CARE_PROVIDER_SITE_OTHER): Payer: BC Managed Care – PPO | Admitting: Cardiology

## 2012-01-20 VITALS — BP 112/62 | HR 83 | Ht 64.0 in | Wt 200.0 lb

## 2012-01-20 DIAGNOSIS — I5020 Unspecified systolic (congestive) heart failure: Secondary | ICD-10-CM

## 2012-01-20 DIAGNOSIS — I5022 Chronic systolic (congestive) heart failure: Secondary | ICD-10-CM

## 2012-01-20 MED ORDER — CARVEDILOL 12.5 MG PO TABS: 12.5000 mg | ORAL_TABLET | Freq: Two times a day (BID) | ORAL | Status: DC

## 2012-01-20 NOTE — Patient Instructions (Addendum)
Increase coreg(carvedilol) to 12.5mg  twice a day. You can take two 6.25mg  tablets twice a day and use your current supply.  Your physician recommends that you schedule a follow-up appointment in: 1 month with Sunday Spillers   Keep the echocardiogram appt you already have scheduled for October 15,2013 at 7:30am.  Your physician recommends that you schedule a follow-up appointment with Dr Shirlee Latch a few days after the echocardiogram in October.

## 2012-01-21 NOTE — Progress Notes (Signed)
Patient ID: Cynthia Roth, female   DOB: 1951/03/14, 61 y.o.   MRN: [id number] PCP: Dr. Alva Garnet  61 yo with history of nonischemic cardiomyopathy presents to the office for followup visit.  Patient was admitted to [facility] in 7/13 with acute CHF.  No prior cardiac history.  No ETOH/Drug history.  No significant family history. No recent viral illness. TSH normal.    She had LBBB on EKG and her echo showed EF 15% with severely dilated LV and diastolic dysfunction. LHC showed no significant coronary disease.    Since discharge after diuresis, she has been doing well.  She has not needed Lasix.  She denies exertional dyspnea.  She is back at work.  Occasional very mild lightheadedness with standing.  No chest pain, orthopnea, PND, or syncope.  Weight is stable at 200 lbs.   Labs: K 3.7, Creatinine 0.72, BNP 3968 11/29/11) Labs: K 3.7, Creatinine 0.84, BNP 794, LDL 125, SPEP negative, TSH normal, urine immunofixation negative (12/02/11) Labs: K 4.0, Creatinine 0.8 (12/08/11)  Labs (8/13): K 4.1, creatinine 0.8  ROS: All systems reviewed and negative except as per HPI.   Allergies: NKDA  Past Medical History: 1. CHF and Nonischemic cardiomyopathy: patient was admitted with acute CHF on 11/29/11 and Echo showed EF 15% with diffuse hypokinesis, severely dilated LV, severe diastolic dysfunction. Left heart cath showed no angiographic CAD.  TSH, SPEP, urine immunofixation were all within normal range.  2. LBBB: noted on hospital admission on 11/29/11. No old EKG. 3. HTN 4. Dyslipidemia 5. Tubular adenoma:  Last colonoscopy in 1999. Followed by GI Dr. Loreta Ave.  Past Surgical History  Procedure Date  . Abdominal hysterectomy     partial  . Cardiac catheterization 12-03-11    selectie coronary angiography   Family History  Problem Relation Age of Onset  . Heart failure Maternal Grandmother     diagnosed in age of 60's. still living at age of 60's.  . Colon cancer      family history  .  Angina Mother     diagnosed at age of 60's, unsure whether it was a true diagnosis. unsure about the treatment    History   Social History  . Marital Status: Divorced    Spouse Name: N/A    Number of Children: N/A  . Years of Education: N/A   Occupational History  . Not on file.   Social History Main Topics  . Smoking status: Never Smoker   . Smokeless tobacco: Not on file  . Alcohol Use: No  . Drug Use: No  . Sexually Active: Not on file   Other Topics Concern  . Not on file   Social History Narrative   Lives by herself in [place]. Divorced with one adult child who lives in [place]. She has two brothers living in [place]. 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    Current Outpatient Prescriptions on File Prior to Visit  Medication Sig Dispense Refill  . Ascorbic Acid (VITAMIN C) 100 MG tablet Take 100 mg by mouth daily.      . furosemide (LASIX) 20 MG tablet Take 1 tablet (20 mg total) by mouth daily as needed.  30 tablet  6  . lisinopril (PRINIVIL,ZESTRIL) 5 MG tablet Take 1 tablet (5 mg total) by mouth 2 (two) times daily. Hold tonight and tomorrow, restart on 12/05/2011  60 tablet  11  . Multiple Vitamins-Minerals (MULTIVITAMIN WITH MINERALS) tablet Take  1 tablet by mouth daily.      [name] spironolactone (ALDACTONE) 25 MG tablet Take 1 tablet (25 mg total) by mouth daily.  30 tablet  11  . aspirin EC 81 MG EC tablet Take 1 tablet (81 mg total) by mouth daily.      . carvedilol (COREG) 12.5 MG tablet Take 1 tablet (12.5 mg total) by mouth 2 (two) times daily.  60 tablet  3   Filed Vitals:   01/20/12 1209  BP: 112/62  Pulse: 83  Height: 5\' 4"  (1.626 m)  Weight: 200 lb (90.719 kg)  SpO2: 97%   General: NAD Neck: No JVD, no thyromegaly or thyroid nodule.  Lungs: Clear to auscultation bilaterally with normal respiratory effort. CV: Nondisplaced PMI.  Heart regular S1/S2, no S3/S4, no murmur.  No peripheral edema.  No  carotid bruit.  Normal pedal pulses.  Abdomen: Soft, nontender, no hepatosplenomegaly, no distention.  Neurologic: Alert and oriented x 3.  Psych: Normal affect. Extremities: No clubbing or cyanosis.

## 2012-01-21 NOTE — Assessment & Plan Note (Signed)
Patient has a nonischemic CMP of uncertain etiology.  TSH and serum/urine immunofixations were normal.  Possible etiologies include viral myocarditis or LBBB cardiomyopathy.  She is doing well with NYHA class II symptoms.  She is not volume overloaded.   - Continue lisinopril and spironolactone at current doses.  - Increase Coreg to 12.5 mg bid.  - Followup with Norma Fredrickson in 1 month for uptitration of lisinopril if she is stable.  - Followup with me in 2 months after echo.  - Repeat echo in 10/13.  If EF on echo in 10/13 remains < 35%, she will need implantation of a CRT-D device.  QRS duration is 146 msec so I suspect that she may be a patient who will have a good response to CRT.

## 2012-02-21 ENCOUNTER — Ambulatory Visit: Payer: BC Managed Care – PPO | Admitting: Nurse Practitioner

## 2012-02-23 ENCOUNTER — Encounter: Payer: Self-pay | Admitting: Nurse Practitioner

## 2012-02-23 ENCOUNTER — Ambulatory Visit (INDEPENDENT_AMBULATORY_CARE_PROVIDER_SITE_OTHER): Payer: BC Managed Care – PPO | Admitting: Nurse Practitioner

## 2012-02-23 VITALS — BP 110/70 | HR 59 | Ht 64.0 in | Wt 196.0 lb

## 2012-02-23 DIAGNOSIS — I5022 Chronic systolic (congestive) heart failure: Secondary | ICD-10-CM

## 2012-02-23 LAB — BASIC METABOLIC PANEL
BUN: 14 mg/dL (ref 6–23)
CO2: 27 mEq/L (ref 19–32)
Calcium: 9.4 mg/dL (ref 8.4–10.5)
Chloride: 107 mEq/L (ref 96–112)
Creatinine, Ser: 0.8 mg/dL (ref 0.4–1.2)
GFR: 90.08 mL/min (ref >60.00)
Glucose, Bld: 87 mg/dL (ref 70–99)
Potassium: 3.9 mEq/L (ref 3.5–5.1)
Sodium: 142 mEq/L (ref 135–145)

## 2012-02-23 MED ORDER — LISINOPRIL 5 MG PO TABS: 7.5000 mg | ORAL_TABLET | Freq: Two times a day (BID) | ORAL | Status: DC

## 2012-02-23 NOTE — Progress Notes (Signed)
Cynthia Roth Date of Birth: 23-May-1951 Medical Record #[id number]  History of Present Illness: Cynthia Roth is seen back today for a one month check. She is seen for Dr. Shirlee Latch. She has a nonischemic CM. EF is 15%. Etiology not known. No CAD on cath. Her other problems include L BBB, HTN, HLD, tubular adenoma and obesity.   She comes in today. She is doing well. She had her Coreg increased at her last visit. Dr. Shirlee Latch wished to try and get her on more ACE at this visit. She says she feels really good. Not short of breath. No swelling. No cough. Uses Lasix just rarely and knows that it is due to salt use. No chest pain. She was a little dizzy after her last med change but that has resolved. Weight has been stable.   Current Outpatient Prescriptions on File Prior to Visit  Medication Sig Dispense Refill  . aspirin EC 81 MG EC tablet Take 1 tablet (81 mg total) by mouth daily.      . carvedilol (COREG) 12.5 MG tablet Take 1 tablet (12.5 mg total) by mouth 2 (two) times daily.  60 tablet  3  . furosemide (LASIX) 20 MG tablet Take 1 tablet (20 mg total) by mouth daily as needed.  30 tablet  6  . Multiple Vitamins-Minerals (MULTIVITAMIN WITH MINERALS) tablet Take 1 tablet by mouth daily.      [name] spironolactone (ALDACTONE) 25 MG tablet Take 1 tablet (25 mg total) by mouth daily.  30 tablet  11  . DISCONTD: lisinopril (PRINIVIL,ZESTRIL) 5 MG tablet Take 1 tablet (5 mg total) by mouth 2 (two) times daily. Hold tonight and tomorrow, restart on 12/05/2011  60 tablet  11  . Ascorbic Acid (VITAMIN C) 100 MG tablet Take 100 mg by mouth daily.        No Known Allergies  Past Medical History  Diagnosis Date  . Hypertension   . GERD (gastroesophageal reflux disease)   . Hx of colonic polyps   . Tubular adenoma 1999    history of  . Nonischemic cardiomyopathy   . CHF (congestive heart failure) 12/02/2011  . LBBB (left bundle branch block) 12/02/2011  . Dyslipidemia 12/02/2011  . SOB (shortness of breath)  12/02/2011    Past Surgical History  Procedure Date  . Abdominal hysterectomy     partial  . Cardiac catheterization 12-03-11    selectie coronary angiography    History  Smoking status  . Never Smoker   Smokeless tobacco  . Not on file    History  Alcohol Use No    Family History  Problem Relation Age of Onset  . Heart failure Maternal Grandmother     diagnosed in age of 70's. still living at age of 58's.  . Colon cancer      family history  . Angina Mother     diagnosed at age of 80's, unsure whether it was a true diagnosis. unsure about the treatment    Review of Systems: The review of systems is per the HPI.  All other systems were reviewed and are negative.  Physical Exam: BP 110/70  Pulse 59  Ht 5\' 4"  (1.626 m)  Wt 196 lb (88.905 kg)  BMI 33.64 kg/m2 Patient is very pleasant and in no acute distress. She remains obese.  Skin is warm and dry. Color is normal.  HEENT is unremarkable. Normocephalic/atraumatic. PERRL. Sclera are nonicteric. Neck is supple. No masses. No JVD. Lungs are clear. Cardiac exam shows  a regular rate and rhythm. Abdomen is soft. Extremities are without edema. Gait and ROM are intact. No gross neurologic deficits noted.  LABORATORY DATA: BMET is pending for today.   Lab Results  Component Value Date   WBC 5.1 12/03/2011   HGB 15.1* 12/03/2011   HCT 43.8 12/03/2011   PLT 335 12/03/2011   GLUCOSE 86 01/05/2012   CHOL 168 12/01/2011   TRIG 73 12/01/2011   HDL 28* 12/01/2011   LDLCALC 125* 12/01/2011   ALT 41* 11/29/2011   AST 25 11/29/2011   NA 139 01/05/2012   K 4.1 01/05/2012   CL 103 01/05/2012   CREATININE 0.8 01/05/2012   BUN 15 01/05/2012   CO2 27 01/05/2012   TSH 2.404 11/30/2011   INR 1.15 12/03/2011     Assessment / Plan: 1. Nonischemic CM - doing well clinically. Has echo scheduled and follow up with Dr. Shirlee Latch next month. She is agreeable to trying to increase her ACE. Will try 7.5 mg BID. She will let us know if her dizziness returns. Check BMET today  as well.   2. HTN - Blood pressure looks ok. Hopefully she will be able to tolerate the increase in ACE. She will try to monitor at home.   We will see her back next month. She has a scheduled echo visit and follow up with Dr. Shirlee Latch. Will try to increase her ACE. Continue other medicines. We have discussed the need to restrict salt as well as to avoid salt substitute. Will check labs today.  Patient is agreeable to this plan and will call if any problems develop in the interim.

## 2012-02-23 NOTE — Patient Instructions (Addendum)
We are going to check labs today  Increase the Lisinopril to one and a half tablets two times a day - this prescription is at the drug store  Try to check some blood pressures for [place]  Call us if you are dizzy  Stay away from the salt and salt substitute products  Keep weighing every day and take the Lasix as needed  Keep your appointments for next month for the ultrasound and visit with Dr. Shirlee Latch  Call the [facility] office at [phone] if you have any questions, problems or concerns.

## 2012-02-25 ENCOUNTER — Telehealth: Payer: Self-pay | Admitting: *Deleted

## 2012-02-25 NOTE — Telephone Encounter (Signed)
Message copied by Tarri Fuller on Fri Feb 25, 2012 12:17 PM ------      Message from: Rosalio Macadamia      Created: Wed Feb 23, 2012  4:09 PM       Ok to report. BMET is normal.

## 2012-02-25 NOTE — Telephone Encounter (Signed)
lmom labs normal 

## 2012-03-14 ENCOUNTER — Ambulatory Visit (HOSPITAL_COMMUNITY): Payer: BC Managed Care – PPO | Attending: Cardiovascular Disease | Admitting: Radiology

## 2012-03-14 DIAGNOSIS — I2589 Other forms of chronic ischemic heart disease: Secondary | ICD-10-CM | POA: Insufficient documentation

## 2012-03-14 DIAGNOSIS — I059 Rheumatic mitral valve disease, unspecified: Secondary | ICD-10-CM | POA: Insufficient documentation

## 2012-03-14 DIAGNOSIS — I10 Essential (primary) hypertension: Secondary | ICD-10-CM | POA: Insufficient documentation

## 2012-03-14 DIAGNOSIS — I429 Cardiomyopathy, unspecified: Secondary | ICD-10-CM

## 2012-03-14 DIAGNOSIS — I379 Nonrheumatic pulmonary valve disorder, unspecified: Secondary | ICD-10-CM | POA: Insufficient documentation

## 2012-03-14 DIAGNOSIS — I509 Heart failure, unspecified: Secondary | ICD-10-CM | POA: Insufficient documentation

## 2012-03-14 NOTE — Progress Notes (Signed)
Echocardiogram performed.  

## 2012-03-15 ENCOUNTER — Other Ambulatory Visit: Payer: Self-pay | Admitting: *Deleted

## 2012-03-21 ENCOUNTER — Encounter: Payer: Self-pay | Admitting: Cardiology

## 2012-03-21 ENCOUNTER — Ambulatory Visit (INDEPENDENT_AMBULATORY_CARE_PROVIDER_SITE_OTHER): Payer: BC Managed Care – PPO | Admitting: Cardiology

## 2012-03-21 VITALS — BP 118/76 | HR 80 | Ht 64.0 in | Wt 196.0 lb

## 2012-03-21 DIAGNOSIS — I5020 Unspecified systolic (congestive) heart failure: Secondary | ICD-10-CM

## 2012-03-21 DIAGNOSIS — I5022 Chronic systolic (congestive) heart failure: Secondary | ICD-10-CM

## 2012-03-21 MED ORDER — LISINOPRIL 10 MG PO TABS: 10.0000 mg | ORAL_TABLET | Freq: Two times a day (BID) | ORAL | Status: DC

## 2012-03-21 NOTE — Patient Instructions (Addendum)
Increase lisinopril to 10mg  two times a day, You can take two 5mg  tablets two times a day and use your current supply.  Your physician recommends that you return for lab work in: 2 weeks--BMET.  You have been referred to EP for evaluation for CRT-D. ASAP  Your physician recommends that you schedule a follow-up appointment in: 2 months with Dr Shirlee Latch.

## 2012-03-22 NOTE — Progress Notes (Signed)
Patient ID: Cynthia Roth, female   DOB: [birth year], 61 y.o.   MRN: [id number] PCP: Dr. Alva Garnet  61 yo with history of nonischemic cardiomyopathy presents to the office for followup visit.  Patient was admitted to [facility] in 7/13 with acute CHF.  No prior cardiac history.  No ETOH/Drug history.  No significant family history. No recent viral illness. TSH normal.    She had LBBB on EKG and her echo showed EF 15% with severely dilated LV and diastolic dysfunction. LHC showed no significant coronary disease.    Since discharge after diuresis, she has been doing well.  She uses Lasix as needed, maybe once a week.  She denies exertional dyspnea.  She is back at work.  No lightheadedness with standing.  No chest pain, orthopnea, PND, or syncope.  Weight is stable.  Echo was repeated in 10/13, still showing EF 15% with diffuse hypokinesis.   Labs: K 3.7, Creatinine 0.72, BNP 3968 (11/29/11) Labs: K 3.7, Creatinine 0.84, BNP 794, LDL 125, SPEP negative, TSH normal, urine immunofixation negative (12/02/11) Labs: K 4.0, Creatinine 0.8 (12/08/11) Labs: K 3.9, creatinine 0.8 (9/13)  ROS: All systems reviewed and negative except as per HPI.   Allergies: NKDA  Past Medical History: 1. CHF and Nonischemic cardiomyopathy: patient was admitted with acute CHF on 11/29/11 and Echo showed EF 15% with diffuse hypokinesis, severely dilated LV, severe diastolic dysfunction. Left heart cath showed no angiographic CAD.  TSH, SPEP, urine immunofixation were all within normal range.  2. LBBB: noted on hospital admission on 11/29/11. No old EKG. 3. HTN 4. Dyslipidemia 5. Tubular adenoma:  Last colonoscopy in 1999. Followed by GI Dr. Loreta Ave.  Past Surgical History  Procedure Date  . Abdominal hysterectomy     partial  . Cardiac catheterization 12-03-11    selectie coronary angiography   Family History  Problem Relation Age of Onset  . Heart failure Maternal Grandmother     diagnosed in age of 59's. still  living at age of 54's.  . Colon cancer      family history  . Angina Mother     diagnosed at age of 84's, unsure whether it was a true diagnosis. unsure about the treatment    History   Social History  . Marital Status: Divorced    Spouse Name: N/A    Number of Children: N/A  . Years of Education: N/A   Occupational History  . Not on file.   Social History Main Topics  . Smoking status: Never Smoker   . Smokeless tobacco: Not on file  . Alcohol Use: No  . Drug Use: No  . Sexually Active: Not on file   Other Topics Concern  . Not on file   Social History Narrative   Lives by herself in [place]. Divorced with one adult child who lives in [place]. She has two brothers living in [place]. 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    Current Outpatient Prescriptions on File Prior to Visit  Medication Sig Dispense Refill  . aspirin EC 81 MG EC tablet Take 1 tablet (81 mg total) by mouth daily.      . carvedilol (COREG) 12.5 MG tablet Take 1 tablet (12.5 mg total) by mouth 2 (two) times daily.  60 tablet  3  . furosemide (LASIX) 20 MG tablet Take 1 tablet (20 mg total) by mouth daily as needed.  30 tablet  6  . spironolactone (  ALDACTONE) 25 MG tablet Take 1 tablet (25 mg total) by mouth daily.  30 tablet  11  . lisinopril (PRINIVIL,ZESTRIL) 10 MG tablet Take 1 tablet (10 mg total) by mouth 2 (two) times daily.  60 tablet  6   Filed Vitals:   03/21/12 1156  BP: 118/76  Pulse: 80  Height: 5\' 4"  (1.626 m)  Weight: 196 lb (88.905 kg)   General: NAD Neck: No JVD, no thyromegaly or thyroid nodule.  Lungs: Clear to auscultation bilaterally with normal respiratory effort. CV: Nondisplaced PMI.  Heart regular S1/S2, no S3/S4, no murmur.  No peripheral edema.  No carotid bruit.  Normal pedal pulses.  Abdomen: Soft, nontender, no hepatosplenomegaly, no distention.  Neurologic: Alert and oriented x 3.  Psych: Normal  affect. Extremities: No clubbing or cyanosis.  Assessment/Plan:  Systolic heart failure  Patient has a nonischemic CMP of uncertain etiology. TSH and serum/urine immunofixations were normal. Possible etiologies include viral myocarditis or LBBB cardiomyopathy. She is doing well with NYHA class II symptoms. She is not volume overloaded.  - Continue Coreg and spironolactone at current doses.  - Increase lisinopril to 10 mg bid with BMET in 2 wks.   - EF remains 15% on echo.  She will need implantation of a CRT-D device. QRS duration is 146 msec so I suspect that she may be a patient who will have a good response to CRT.  I will refer her to EP.  I would prefer that she get a Medtronic device with Optivol or St Jude with Corevue.   Temika Sutphin [employer]

## 2012-03-28 ENCOUNTER — Encounter: Payer: Self-pay | Admitting: Internal Medicine

## 2012-03-28 ENCOUNTER — Ambulatory Visit (INDEPENDENT_AMBULATORY_CARE_PROVIDER_SITE_OTHER): Payer: BC Managed Care – PPO | Admitting: Internal Medicine

## 2012-03-28 VITALS — BP 109/69 | HR 79 | Ht 64.5 in | Wt 197.4 lb

## 2012-03-28 DIAGNOSIS — I447 Left bundle-branch block, unspecified: Secondary | ICD-10-CM

## 2012-03-28 DIAGNOSIS — I5020 Unspecified systolic (congestive) heart failure: Secondary | ICD-10-CM

## 2012-03-28 DIAGNOSIS — I428 Other cardiomyopathies: Secondary | ICD-10-CM

## 2012-03-28 NOTE — Assessment & Plan Note (Addendum)
The patient has nonischemic cardiomyopathy with left bundle branch block class II congestive heart failure on guideline directed medical therapy. She is an appropriate candidate for consideration of CRT D. therapy and I have reviewed this extensively with her and her son. We discussed potential benefits and risks including limitations of CRT in predicting will respond. As a woman with nonischemic heart disease she is in the most likely to respond group though.  I would ask Dr. DM to consider the addition of Bidil  Have reviewed the potential benefits and risks of ICD implantation including but not limited to death, perforation of heart or lung, lead dislodgement especially with the CRT lead, infection,  device malfunction and inappropriate shocks. The patient and family *express understanding  and are willing to proceed.

## 2012-03-28 NOTE — Progress Notes (Signed)
ELECTROPHYSIOLOGY CONSULT NOTE  Patient ID: Cynthia Roth, MRN: [id number], DOB/AGE: 06/24/1950 61 y.o. Admit date: (Not on file) Date of Consult: 03/28/2012  Primary Physician: Alva Garnet., MD Primary Cardiologist: DM  Chief Complaint: ? ICD   HPI Cynthia Roth is a 62 y.o. female *seen at the request of Dr. DM for consideration of CRT D. the setting of nonischemic cardiomyopathy. She initially presented July/13. She was found to have an ejection fraction of 15% without significant coronary artery disease. Her LV was severely dilated. Repeat echocardiogram 2 weeks ago demonstrated EF of 15%. She is on guideline directed medical therapy.  She presented initially as noted with severe shortness of breath that had been progressive for a month or 2. At this point she is only modestly limited. She can climb flights of stairs at all mostly but struggles with them a little bit at work. She does not have edema currently ; she has had no problems recently with nocturnal dyspnea or orthopnea.  She's had no chest pain, palpitations or syncope. She continues to work Past Medical History  Diagnosis Date  . Hypertension   . GERD (gastroesophageal reflux disease)   . Hx of colonic polyps   . Tubular adenoma 1999    history of  . Nonischemic cardiomyopathy   . CHF (congestive heart failure) 12/02/2011  . LBBB (left bundle branch block) 12/02/2011  . Dyslipidemia 12/02/2011  . SOB (shortness of breath) 12/02/2011      Surgical History:  Past Surgical History  Procedure Date  . Abdominal hysterectomy     partial  . Cardiac catheterization 12-03-11    selectie coronary angiography     Home Meds: Prior to Admission medications   Medication Sig Start Date End Date Taking? Authorizing Provider  aspirin EC 81 MG EC tablet Take 1 tablet (81 mg total) by mouth daily. 12/03/11 12/02/12 Yes Rhonda G Barrett, PA  carvedilol (COREG) 12.5 MG tablet Take 1 tablet (12.5 mg total) by mouth 2 (two)  times daily. 01/20/12 01/19/13 Yes Laurey Morale, MD  furosemide (LASIX) 20 MG tablet Take 1 tablet (20 mg total) by mouth daily as needed. 01/05/12 01/04/13 Yes Rosalio Macadamia, NP  lisinopril (PRINIVIL,ZESTRIL) 10 MG tablet Take 1 tablet (10 mg total) by mouth 2 (two) times daily. 03/21/12  Yes Laurey Morale, MD  spironolactone (ALDACTONE) 25 MG tablet Take 1 tablet (25 mg total) by mouth daily. 12/03/11 12/02/12 Yes Rhonda G Barrett, PA      Allergies: No Known Allergies  History   Social History  . Marital Status: Divorced    Spouse Name: N/A    Number of Children: N/A  . Years of Education: N/A   Occupational History  . Not on file.   Social History Main Topics  . Smoking status: Never Smoker   . Smokeless tobacco: Not on file  . Alcohol Use: No  . Drug Use: No  . Sexually Active: Not on file   Other Topics Concern  . Not on file   Social History Narrative   Lives by herself in [place]. Divorced with one adult child who lives in [place]. She has two brothers living in [place]. 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     Family History  Problem Relation Age of Onset  . Heart failure Maternal Grandmother     diagnosed in age of 44's. still living at age of 63's.  . Colon cancer  family history  . Angina Mother     diagnosed at age of 76's, unsure whether it was a true diagnosis. unsure about the treatment     ROS:  Please see the history of present illness.  Mild arthirits, vision difficiulties  All other systems reviewed and negative.    Physical Exam:  Blood pressure 109/69, pulse 79, height 5' 4.5" (1.638 m), weight 197 lb 6.4 oz (89.54 kg). General: Well developed, well nourished female in no acute distress. Head: Normocephalic, atraumatic, sclera non-icteric, no xanthomas, nares are without discharge. Lymph Nodes:  none Back: without scoliosis/kyphosis , no CVA tendersness Neck: Negative for carotid  bruits. JVD6-7 Lungs: Clear bilaterally to auscultation without wheezes, rales, or rhonchi. Breathing is unlabored. Heart: RRR with S1 S2.2/6 systolic murmur , rubs, or gallops appreciated. Abdomen: Soft, non-tender, non-distended with normoactive bowel sounds. No hepatomegaly. No rebound/guarding. No obvious abdominal masses. Msk:  Strength and tone appear normal for age. Extremities: No clubbing or cyanosis. No  edema.  Distal pedal pulses are 2+ and equal bilaterally. Skin: Warm and Dry Neuro: Alert and oriented X 3. CN III-XII intact Grossly normal sensory and motor function . Psych:  Responds to questions appropriately with a normal affect.      Labs: Cardiac Enzymes No results found for this basename: CKTOTAL:4,CKMB:4,TROPONINI:4 in the last 72 hours CBC Lab Results  Component Value Date   WBC 5.1 12/03/2011   HGB 15.1* 12/03/2011   HCT 43.8 12/03/2011   MCV 90.3 12/03/2011   PLT 335 12/03/2011   PROTIME: No results found for this basename: LABPROT:3,INR:3 in the last 72 hours Chemistry No results found for this basename: NA,K,CL,CO2,BUN,CREATININE,CALCIUM,LABALBU,PROT,BILITOT,ALKPHOS,ALT,AST,GLUCOSE in the last 168 hours Lipids Lab Results  Component Value Date   CHOL 168 12/01/2011   HDL 28* 12/01/2011   LDLCALC 125* 12/01/2011   TRIG 73 12/01/2011   BNP Pro B Natriuretic peptide (BNP)  Date/Time Value Range Status  12/17/2011 12:35 PM 162.0* 0.0 - 100.0 pg/mL Final  12/02/2011  9:40 AM 794.6* 0 - 125 pg/mL Final  11/29/2011 11:49 PM 3464.0* 0 - 125 pg/mL Final  11/29/2011  2:31 PM 3968.0* 0 - 125 pg/mL Final   Miscellaneous No results found for this basename: DDIMER    Radiology/Studies:  No results found.  EKG: NSR with left axis deviation and left bundle branch block with a QRS duration of 146 ms   Assessment and Plan: Sherryl Manges

## 2012-03-28 NOTE — Assessment & Plan Note (Signed)
As above.

## 2012-04-04 ENCOUNTER — Other Ambulatory Visit (INDEPENDENT_AMBULATORY_CARE_PROVIDER_SITE_OTHER): Payer: BC Managed Care – PPO

## 2012-04-04 DIAGNOSIS — I5022 Chronic systolic (congestive) heart failure: Secondary | ICD-10-CM

## 2012-04-04 LAB — BASIC METABOLIC PANEL
BUN: 17 mg/dL (ref 6–23)
CO2: 28 mEq/L (ref 19–32)
Calcium: 9.4 mg/dL (ref 8.4–10.5)
Chloride: 102 mEq/L (ref 96–112)
Creatinine, Ser: 0.8 mg/dL (ref 0.4–1.2)
GFR: 95.33 mL/min (ref >60.00)
Glucose, Bld: 75 mg/dL (ref 70–99)
Potassium: 3.7 mEq/L (ref 3.5–5.1)
Sodium: 137 mEq/L (ref 135–145)

## 2012-05-02 ENCOUNTER — Encounter (HOSPITAL_COMMUNITY): Payer: Self-pay | Admitting: Pharmacy Technician

## 2012-05-02 ENCOUNTER — Other Ambulatory Visit: Payer: Self-pay | Admitting: *Deleted

## 2012-05-02 ENCOUNTER — Telehealth: Payer: Self-pay | Admitting: Internal Medicine

## 2012-05-02 ENCOUNTER — Encounter: Payer: Self-pay | Admitting: *Deleted

## 2012-05-02 DIAGNOSIS — I447 Left bundle-branch block, unspecified: Secondary | ICD-10-CM

## 2012-05-02 DIAGNOSIS — I509 Heart failure, unspecified: Secondary | ICD-10-CM

## 2012-05-02 NOTE — Telephone Encounter (Signed)
Provided pt's son with instructions for procedure.

## 2012-05-02 NOTE — Telephone Encounter (Signed)
plz return call to pt [phone] regarding upcoming procedure with Graciela Husbands

## 2012-05-03 ENCOUNTER — Other Ambulatory Visit (INDEPENDENT_AMBULATORY_CARE_PROVIDER_SITE_OTHER): Payer: BC Managed Care – PPO

## 2012-05-03 DIAGNOSIS — I509 Heart failure, unspecified: Secondary | ICD-10-CM

## 2012-05-03 DIAGNOSIS — I447 Left bundle-branch block, unspecified: Secondary | ICD-10-CM

## 2012-05-03 LAB — CBC WITH DIFFERENTIAL/PLATELET
Basophils Absolute: 0 10*3/uL (ref 0.0–0.1)
Basophils Relative: 0.7 % (ref 0.0–3.0)
Eosinophils Absolute: 0.2 10*3/uL (ref 0.0–0.7)
Eosinophils Relative: 3.7 % (ref 0.0–5.0)
HCT: 35.3 % — ABNORMAL LOW (ref 36.0–46.0)
Hemoglobin: 11.8 g/dL — ABNORMAL LOW (ref 12.0–15.0)
Lymphocytes Relative: 40.1 % (ref 12.0–46.0)
Lymphs Abs: 2 10*3/uL (ref 0.7–4.0)
MCHC: 33.3 g/dL (ref 30.0–36.0)
MCV: 98.2 fl (ref 78.0–100.0)
Monocytes Absolute: 0.4 10*3/uL (ref 0.1–1.0)
Monocytes Relative: 8.7 % (ref 3.0–12.0)
Neutro Abs: 2.3 10*3/uL (ref 1.4–7.7)
Neutrophils Relative %: 46.8 % (ref 43.0–77.0)
Platelets: 241 10*3/uL (ref 150.0–400.0)
RBC: 3.6 Mil/uL — ABNORMAL LOW (ref 3.87–5.11)
RDW: 13.5 % (ref 11.5–14.6)
WBC: 4.9 10*3/uL (ref 4.5–10.5)

## 2012-05-03 LAB — BASIC METABOLIC PANEL
BUN: 15 mg/dL (ref 6–23)
CO2: 30 mEq/L (ref 19–32)
Calcium: 9.1 mg/dL (ref 8.4–10.5)
Chloride: 104 mEq/L (ref 96–112)
Creatinine, Ser: 0.7 mg/dL (ref 0.4–1.2)
GFR: 107.8 mL/min (ref >60.00)
Glucose, Bld: 77 mg/dL (ref 70–99)
Potassium: 3.8 mEq/L (ref 3.5–5.1)
Sodium: 140 mEq/L (ref 135–145)

## 2012-05-03 LAB — APTT: aPTT: 27.3 s (ref 21.7–28.8)

## 2012-05-03 LAB — PROTIME-INR
INR: 1.3 ratio — ABNORMAL HIGH (ref 0.8–1.0)
Prothrombin Time: 13.3 s — ABNORMAL HIGH (ref 10.2–12.4)

## 2012-05-09 ENCOUNTER — Telehealth: Payer: Self-pay | Admitting: Internal Medicine

## 2012-05-09 NOTE — Telephone Encounter (Signed)
New Problem:    Called in needing to go over her procedure instructions again.  Please call back and feel free to leave another message.

## 2012-05-09 NOTE — Telephone Encounter (Signed)
Reviewed preop instructions with patient

## 2012-05-10 ENCOUNTER — Encounter (HOSPITAL_COMMUNITY): Payer: Self-pay | Admitting: General Practice

## 2012-05-10 ENCOUNTER — Encounter (HOSPITAL_COMMUNITY)
Admission: RE | Disposition: A | Discharge status: Home / Self Care | Payer: Self-pay | Source: Ambulatory Visit | Attending: Internal Medicine

## 2012-05-10 ENCOUNTER — Ambulatory Visit (HOSPITAL_COMMUNITY)
Admission: RE | Admit: 2012-05-10 | Discharge: 2012-05-11 | Disposition: A | Discharge status: Home / Self Care | Payer: BC Managed Care – PPO | Source: Ambulatory Visit | Attending: Internal Medicine | Admitting: Internal Medicine

## 2012-05-10 DIAGNOSIS — K219 Gastro-esophageal reflux disease without esophagitis: Secondary | ICD-10-CM | POA: Insufficient documentation

## 2012-05-10 DIAGNOSIS — I447 Left bundle-branch block, unspecified: Secondary | ICD-10-CM | POA: Diagnosis present

## 2012-05-10 DIAGNOSIS — I428 Other cardiomyopathies: Secondary | ICD-10-CM | POA: Diagnosis present

## 2012-05-10 DIAGNOSIS — Z95810 Presence of automatic (implantable) cardiac defibrillator: Secondary | ICD-10-CM

## 2012-05-10 DIAGNOSIS — I509 Heart failure, unspecified: Secondary | ICD-10-CM

## 2012-05-10 DIAGNOSIS — E785 Hyperlipidemia, unspecified: Secondary | ICD-10-CM | POA: Insufficient documentation

## 2012-05-10 DIAGNOSIS — I10 Essential (primary) hypertension: Secondary | ICD-10-CM | POA: Insufficient documentation

## 2012-05-10 DIAGNOSIS — I429 Cardiomyopathy, unspecified: Secondary | ICD-10-CM

## 2012-05-10 DIAGNOSIS — I5020 Unspecified systolic (congestive) heart failure: Secondary | ICD-10-CM

## 2012-05-10 DIAGNOSIS — I5022 Chronic systolic (congestive) heart failure: Secondary | ICD-10-CM

## 2012-05-10 HISTORY — DX: Gout, unspecified: M10.9

## 2012-05-10 HISTORY — DX: Presence of automatic (implantable) cardiac defibrillator: Z95.810

## 2012-05-10 HISTORY — PX: CARDIAC DEFIBRILLATOR PLACEMENT: SHX171

## 2012-05-10 HISTORY — PX: BI-VENTRICULAR IMPLANTABLE CARDIOVERTER DEFIBRILLATOR: SHX5459

## 2012-05-10 LAB — SURGICAL PCR SCREEN
MRSA, PCR: NEGATIVE
Staphylococcus aureus: NEGATIVE

## 2012-05-10 SURGERY — BI-VENTRICULAR IMPLANTABLE CARDIOVERTER DEFIBRILLATOR  (CRT-D)
Anesthesia: LOCAL

## 2012-05-10 MED ORDER — ASPIRIN EC 81 MG PO TBEC
81.0000 mg | DELAYED_RELEASE_TABLET | Freq: Every day | ORAL | Status: DC
  Administered 2012-05-11: 81 mg via ORAL
  Filled 2012-05-10: qty 1

## 2012-05-10 MED ORDER — YOU HAVE A PACEMAKER BOOK
Freq: Once | Status: DC
  Filled 2012-05-10: qty 1

## 2012-05-10 MED ORDER — MIDAZOLAM HCL 5 MG/5ML IJ SOLN
INTRAMUSCULAR | Status: AC
  Filled 2012-05-10: qty 5

## 2012-05-10 MED ORDER — SODIUM CHLORIDE 0.9 % IV SOLN: INTRAVENOUS | Status: AC

## 2012-05-10 MED ORDER — LISINOPRIL 10 MG PO TABS
10.0000 mg | ORAL_TABLET | Freq: Two times a day (BID) | ORAL | Status: DC
  Administered 2012-05-10 – 2012-05-11 (×2): 10 mg via ORAL
  Filled 2012-05-10 (×4): qty 1

## 2012-05-10 MED ORDER — CEFAZOLIN SODIUM 1-5 GM-% IV SOLN
1.0000 g | Freq: Four times a day (QID) | INTRAVENOUS | Status: DC
  Administered 2012-05-10 – 2012-05-11 (×2): 1 g via INTRAVENOUS
  Filled 2012-05-10 (×4): qty 50

## 2012-05-10 MED ORDER — SODIUM CHLORIDE 0.9 % IJ SOLN: 3.0000 mL | Freq: Two times a day (BID) | INTRAMUSCULAR | Status: DC

## 2012-05-10 MED ORDER — FENTANYL CITRATE 0.05 MG/ML IJ SOLN
INTRAMUSCULAR | Status: AC
  Filled 2012-05-10: qty 2

## 2012-05-10 MED ORDER — SPIRONOLACTONE 25 MG PO TABS
25.0000 mg | ORAL_TABLET | Freq: Every day | ORAL | Status: DC
  Administered 2012-05-11: 25 mg via ORAL
  Filled 2012-05-10: qty 1

## 2012-05-10 MED ORDER — SODIUM CHLORIDE 0.9 % IR SOLN
80.0000 mg | Status: DC
  Filled 2012-05-10: qty 2

## 2012-05-10 MED ORDER — CEFAZOLIN SODIUM-DEXTROSE 2-3 GM-% IV SOLR
2.0000 g | INTRAVENOUS | Status: DC
  Filled 2012-05-10 (×2): qty 50

## 2012-05-10 MED ORDER — MUPIROCIN 2 % EX OINT
TOPICAL_OINTMENT | CUTANEOUS | Status: AC
  Filled 2012-05-10: qty 22

## 2012-05-10 MED ORDER — CARVEDILOL 12.5 MG PO TABS
12.5000 mg | ORAL_TABLET | Freq: Two times a day (BID) | ORAL | Status: DC
  Administered 2012-05-10: 12.5 mg via ORAL
  Filled 2012-05-10 (×3): qty 1

## 2012-05-10 MED ORDER — SODIUM CHLORIDE 0.45 % IV SOLN
INTRAVENOUS | Status: DC
  Administered 2012-05-10: 50 mL/h via INTRAVENOUS

## 2012-05-10 MED ORDER — MUPIROCIN 2 % EX OINT
TOPICAL_OINTMENT | Freq: Two times a day (BID) | CUTANEOUS | Status: DC
  Administered 2012-05-10: 1 via NASAL

## 2012-05-10 MED ORDER — CHLORHEXIDINE GLUCONATE 4 % EX LIQD: 60.0000 mL | Freq: Once | CUTANEOUS | Status: DC

## 2012-05-10 MED ORDER — ACETAMINOPHEN 325 MG PO TABS
325.0000 mg | ORAL_TABLET | ORAL | Status: DC | PRN
  Administered 2012-05-10 – 2012-05-11 (×2): 650 mg via ORAL
  Filled 2012-05-10 (×2): qty 2

## 2012-05-10 MED ORDER — ONDANSETRON HCL 4 MG/2ML IJ SOLN: 4.0000 mg | Freq: Four times a day (QID) | INTRAMUSCULAR | Status: DC | PRN

## 2012-05-10 MED ORDER — SODIUM CHLORIDE 0.9 % IJ SOLN: 3.0000 mL | INTRAMUSCULAR | Status: DC | PRN

## 2012-05-10 MED ORDER — SODIUM CHLORIDE 0.9 % IV SOLN: 250.0000 mL | INTRAVENOUS | Status: DC

## 2012-05-10 NOTE — Interval H&P Note (Signed)
History and Physical Interval Note:  05/10/2012 11:45 AM  Cynthia Roth  has presented today for surgery, with the diagnosis of Left bundle branch block  The various methods of treatment have been discussed with the patient and family. After consideration of risks, benefits and other options for treatment, the patient has consented to  Procedure(s) (LRB) with comments: BI-VENTRICULAR IMPLANTABLE CARDIOVERTER DEFIBRILLATOR  (CRT-D) (N/A) as a surgical intervention .  The patient's history has been reviewed, patient examined, no change in status, stable for surgery.  I have reviewed the patient's chart and labs.  Questions were answered to the patient's satisfaction.     Sherryl Manges

## 2012-05-10 NOTE — Progress Notes (Signed)
Brief Cardiology Crosscover note 61 yo with NICM s/p Biven ICD placed 12/11. Called due to brief run of NSVT. Pt mildly symptomatic. Hemodyncamically stable. Checking K+ and Mg. Will replace for goal k > 4 and Mg > 2. Also noted that after NSVT, dual pacing spikes on telemetry changed to single spike. QRS morphology unchanged. Unclear significance but nonetheless has pacer check and CXRay planned for AM.

## 2012-05-10 NOTE — H&P (Signed)
History and Physical  Patient ID: Cynthia Roth MRN: [id number], SOB: 07-Nov-1950 61 y.o. Date of Encounter: 05/10/2012, 11:42 AM  Primary Physician: Alva Garnet., MD HPI  Cynthia Roth is a 61 y.o. female *seen at the request of Dr. DM for consideration of CRT D. the setting of nonischemic cardiomyopathy. She initially presented July/13. She was found to have an ejection fraction of 15% without significant coronary artery disease. Her LV was severely dilated. Repeat echocardiogram 2 weeks ago demonstrated EF of 15%. She is on guideline directed medical therapy.  She presented initially as noted with severe shortness of breath that had been progressive for a month or 2. At this point she is only modestly limited. She can climb flights of stairs at all mostly but struggles with them a little bit at work. She does not have edema currently ; she has had no problems recently with nocturnal dyspnea or orthopnea.  She's had no chest pain, palpitations or syncope. She continues to work    Past Medical History  Diagnosis Date  . Hypertension   . GERD (gastroesophageal reflux disease)   . Hx of colonic polyps   . Tubular adenoma 1999    history of  . Nonischemic cardiomyopathy   . CHF (congestive heart failure) 12/02/2011  . LBBB (left bundle branch block) 12/02/2011  . Dyslipidemia 12/02/2011  . SOB (shortness of breath) 12/02/2011     Past Surgical History  Procedure Date  . Abdominal hysterectomy     partial  . Cardiac catheterization 12-03-11    selectie coronary angiography      Current Facility-Administered Medications  Medication Dose Route Frequency Provider Last Rate Last Dose  . 0.45 % sodium chloride infusion   Intravenous Continuous Duke Salvia, MD 50 mL/hr at 05/10/12 1033 50 mL/hr at 05/10/12 1033  . 0.9 %  sodium chloride infusion  250 mL Intravenous Continuous Duke Salvia, MD      . ceFAZolin (ANCEF) IVPB 2 g/50 mL premix  2 g Intravenous On Call Duke Salvia, MD      . chlorhexidine (HIBICLENS) 4 % liquid 4 application  60 mL Topical Once Duke Salvia, MD      . gentamicin (GARAMYCIN) 80 mg in sodium chloride irrigation 0.9 % 500 mL irrigation  80 mg Irrigation On Call Duke Salvia, MD      . mupirocin ointment (BACTROBAN) 2 %   Nasal BID Duke Salvia, MD   1 application at 05/10/12 1037  . mupirocin ointment (BACTROBAN) 2 %           . sodium chloride 0.9 % injection 3 mL  3 mL Intravenous Q12H Duke Salvia, MD      . sodium chloride 0.9 % injection 3 mL  3 mL Intravenous PRN Duke Salvia, MD       Current Outpatient Prescriptions  Medication Sig Dispense Refill  . aspirin EC 81 MG EC tablet Take 1 tablet (81 mg total) by mouth daily.      . carvedilol (COREG) 12.5 MG tablet Take 1 tablet (12.5 mg total) by mouth 2 (two) times daily.  60 tablet  3  . furosemide (LASIX) 20 MG tablet Take 1 tablet (20 mg total) by mouth daily as needed.  30 tablet  6  . lisinopril (PRINIVIL,ZESTRIL) 10 MG tablet Take 1 tablet (10 mg total) by mouth 2 (two) times daily.  60 tablet  6  . spironolactone (ALDACTONE) 25  MG tablet Take 1 tablet (25 mg total) by mouth daily.  30 tablet  11     Allergies: No Known Allergies   History  Substance Use Topics  . Smoking status: Never Smoker   . Smokeless tobacco: Not on file  . Alcohol Use: No      Family History  Problem Relation Age of Onset  . Heart failure Maternal Grandmother     diagnosed in age of 50's. still living at age of 98's.  . Colon cancer      family history  . Angina Mother     diagnosed at age of 11's, unsure whether it was a true diagnosis. unsure about the treatment      Vital Signs: Blood pressure 115/72, pulse 79, temperature 98.2 F (36.8 C), temperature source Oral, resp. rate 18, height 5' 4.5" (1.638 m), weight 196 lb (88.905 kg).  Well developed and nourished in no acute distress HENT normal Neck supple with JVP-flat Clear Regular rate and rhythm, no  murmurs or gallops Abd-soft with active BS No Clubbing cyanosis edema Skin-warm and dry A & Oriented  Grossly normal sensory and motor function  EKG:  nsr LBBB 150 MSEC  Labs:   Lab Results  Component Value Date   WBC 4.9 05/03/2012   HGB 11.8* 05/03/2012   HCT 35.3* 05/03/2012   MCV 98.2 05/03/2012   PLT 241.0 05/03/2012   No results found for this basename: NA,K,CL,CO2,BUN,CREATININE,CALCIUM,LABALBU,PROT,BILITOT,ALKPHOS,ALT,AST,GLUCOSE in the last 168 hours No results found for this basename: CKTOTAL:4,CKMB:4,TROPONINI:4 in the last 72 hours Lab Results  Component Value Date   CHOL 168 12/01/2011   HDL 28* 12/01/2011   LDLCALC 125* 12/01/2011   TRIG 73 12/01/2011   No results found for this basename: DDIMER   BNP Pro B Natriuretic peptide (BNP)  Date/Time Value Range Status  12/17/2011 12:35 PM 162.0* 0.0 - 100.0 pg/mL Final  12/02/2011  9:40 AM 794.6* 0 - 125 pg/mL Final  11/29/2011 11:49 PM 3464.0* 0 - 125 pg/mL Final  11/29/2011  2:31 PM 3968.0* 0 - 125 pg/mL Final       ASSESSMENT AND PLAN: Patient Active Hospital Problem List: LBBB (left bundle branch block) (12/02/2011)   Systolic heart failure (01/05/2012)   Nonischemic cardiomyopathy (03/28/2012)     For CRT implant for pirmary prevention for NICM and CII CHF on Guideline directed medical therapy Risks reviewed

## 2012-05-10 NOTE — CV Procedure (Signed)
STARLEEN TRUSSELL [id number]  [id number]  Preop Dx: nicm CHF LBBB Postop Dx same/   Procedure: CRT-D implantation  Cx: None  Dictation number [id number]  Sherryl Manges, MD 05/10/2012 4:31 PM

## 2012-05-11 ENCOUNTER — Ambulatory Visit (HOSPITAL_COMMUNITY): Payer: BC Managed Care – PPO

## 2012-05-11 ENCOUNTER — Encounter (HOSPITAL_COMMUNITY): Payer: Self-pay | Admitting: Internal Medicine

## 2012-05-11 DIAGNOSIS — Z95810 Presence of automatic (implantable) cardiac defibrillator: Secondary | ICD-10-CM | POA: Insufficient documentation

## 2012-05-11 DIAGNOSIS — I428 Other cardiomyopathies: Secondary | ICD-10-CM

## 2012-05-11 LAB — MAGNESIUM: Magnesium: 1.7 mg/dL (ref 1.5–2.5)

## 2012-05-11 LAB — POTASSIUM: Potassium: 3.7 mEq/L (ref 3.5–5.1)

## 2012-05-11 MED ORDER — MAGNESIUM SULFATE IN D5W 10-5 MG/ML-% IV SOLN
1.0000 g | Freq: Once | INTRAVENOUS | Status: AC
  Administered 2012-05-11: 1 g via INTRAVENOUS
  Filled 2012-05-11: qty 100

## 2012-05-11 MED ORDER — POTASSIUM CHLORIDE CRYS ER 20 MEQ PO TBCR
40.0000 meq | EXTENDED_RELEASE_TABLET | Freq: Once | ORAL | Status: AC
  Administered 2012-05-11: 04:00:00 40 meq via ORAL
  Filled 2012-05-11: qty 2

## 2012-05-11 NOTE — Progress Notes (Addendum)
   Patient Name: Cynthia Roth      SUBJECTIVE: mild discomfort with sob or cp   Past Medical History  Diagnosis Date  . GERD (gastroesophageal reflux disease)   . Hx of colonic polyps   . Tubular adenoma 1999    history of  . Nonischemic cardiomyopathy   . CHF (congestive heart failure) 12/02/2011  . LBBB (left bundle branch block) 12/02/2011  . Dyslipidemia 12/02/2011  . Hypertension     "used to have this; now my BP is low" (05/10/2012)  . SOB (shortness of breath) 12/02/2011  . Exertional dyspnea     "very seldom" (05/10/2012)  . Gout     "very seldom" (05/10/2012)    PHYSICAL EXAM Filed Vitals:   05/10/12 2000 05/10/12 2015 05/11/12 0028 05/11/12 0557  BP: 111/54 111/54 101/55 102/64  Pulse: 88 90 84 74  Temp:  98.8 F (37.1 C) 97.8 F (36.6 C) 97.8 F (36.6 C)  TempSrc:  Oral Oral Oral  Resp: 19 16 19 18   Height:      Weight:   197 lb 5 oz (89.5 kg)   SpO2: 100% 99% 99% 98%   Well developed and nourished in no acute distress HENT normal Neck supple with JVP-flat Clear Device pocket without hematoma or erythema  Regular rate and rhythm, no murmurs or gallops Abd-soft with active BS No Clubbing cyanosis edema Skin-warm and dry A & Oriented  Grossly normal sensory and motor function   TELEMETRY: Reviewed telemetry pt in  P-synchronous/ AV  pacing :    Intake/Output Summary (Last 24 hours) at 05/11/12 0818 Last data filed at 05/10/12 1930  Gross per 24 hour  Intake    340 ml  Output      0 ml  Net    340 ml    LABS: Basic Metabolic Panel:  Lab 05/10/12 1610  NA --  K 3.7  CL --  CO2 --  GLUCOSE --  BUN --  CREATININE --  CALCIUM --  MG 1.7  PHOS --   BNP: BNP (last 3 results)  Basename 12/17/11 1235 12/02/11 0940 11/29/11 2349  PROBNP 162.0* 794.6* 3464.0*    Device Interrogation:* normal device functionwith modestly high LV threshold   ASSESSMENT AND PLAN:  Patient Active Hospital Problem List: LBBB (left bundle branch  block) (12/02/2011)   Systolic heart failure (01/05/2012)   Nonischemic cardiomyopathy (03/28/2012)  VEntricular tachycardia--isolated nonsustained-- two possibilities either related or not to lead; the morphology is not like other beats sufggesting not related to the lead, but could be proarrhythmia or just unmasked because she is on tele.,will just have to follow   S/p CRT-D   Has f/u withj DM nextFriday, lets do wound check that day F/u sk 3 months  IMPORTANT that we carefully program LV output to minimize impact on battery    Signed, Sherryl Manges MD  05/11/2012

## 2012-05-11 NOTE — Discharge Summary (Signed)
ELECTROPHYSIOLOGY DISCHARGE SUMMARY    Patient ID: Cynthia Roth,  MRN: [id number], DOB/AGE: [birth year] 61 y.o.  Admit date: 05/10/2012 Discharge date: 05/11/2012  Primary Care Physician: Andi Devon, MD Primary Cardiologist: Shirlee Latch, MD  Primary Discharge Diagnosis:  1. NICM, EF 15% s/p BiV ICD implantation 2. Chronic systolic HF 3. LBBB  Secondary Discharge Diagnoses:  1. HTN 2. Dyslipidemia 3. Normal coronary arteries s/p cardiac cath July 2013  Procedures This Admission:  1. BiV ICD implantation RV lead - [employer] Endotak coil lead, model [id number] single coil defibrillator lead, serial [id number] to the right ventricular apex where the bipolar R-wave was 8.5 with a pace impedance of 87, threshold 0.5 at 0.5, current threshold is 1.3 mA. There is no diaphragmatic pacing at 10 V and the current of injury was brisk. LV lead - Medtronic lead 4396,serial #[id number] demonstrated an amplitude of 60 with a pace impedance of 1299, a threshold of 2.8 at 0.5.  RA lead - Medtronic 5076, serial #[id number] [id number] was deployed in the right atrial appendage where bipolar P-wave was 4.4 with a pace impedance of 632 with threshold of 0.8 at 0.5. Current of injury was brisk.  Device - Medtronic Viva XR CRT-D defibrillator, serial #[id number].   History and Hospital Course:  [name] is a pleasant 61 year old woman with a NICM, EF remains 15% despite optimal medical therapy, chronic systolic HF and LBBB who presented yesterday for BiV ICD implantation for CRT and primary prevention of SCD. She tolerated this procedure well without any immediate complication. She remains hemodynamically stable and afebrile. Her chest xray shows stable lead placement without pneumothorax. Her device interrogation has been reviewed by Dr. Graciela Husbands and shows normal BiV ICD function with stable lead parameters/measurements. Her implant site is intact without significant bleeding or hematoma. She has been given  discharge instructions including wound care and activity restrictions. She will follow-up in 10 days for wound check. There were no changes made to her medications. She has been seen, examined and deemed stable for discharge today by Dr. Berton Mount.  Discharge Vitals: Blood pressure 115/68, pulse 82, temperature 97.7 F (36.5 C), temperature source Oral, resp. rate 21, height 5' 4.5" (1.638 m), weight 197 lb 5 oz (89.5 kg), SpO2 98.00%.   Labs: Lab Results  Component Value Date   WBC 4.9 05/03/2012   HGB 11.8* 05/03/2012   HCT 35.3* 05/03/2012   MCV 98.2 05/03/2012   PLT 241.0 05/03/2012    Lab 05/10/12 2320  NA --  K 3.7  CL --  CO2 --  BUN --  CREATININE --  CALCIUM --  PROT --  BILITOT --  ALKPHOS --  ALT --  AST --  GLUCOSE --    Disposition:  The patient is being discharged in stable condition.  Follow-up: Follow-up Information    Follow up with Marca Ancona, MD. On 05/19/2012. (At 9:00 AM (wound check also to be done at this visit))    Contact information:   [address] [place] [address] [phone]      Follow up with Sherryl Manges, MD. On 06/07/2012. (At 12:15 PM)    Contact information:   [address] [place] [address] [phone]        Discharge Medications:    Medication List     As of 05/11/2012 10:09 AM    TAKE these medications         aspirin 81  MG EC tablet   Take 1 tablet (81 mg total) by mouth daily.      carvedilol 12.5 MG tablet   Commonly known as: COREG   Take 1 tablet (12.5 mg total) by mouth 2 (two) times daily.      furosemide 20 MG tablet   Commonly known as: LASIX   Take 1 tablet (20 mg total) by mouth daily as needed.      lisinopril 10 MG tablet   Commonly known as: PRINIVIL,ZESTRIL   Take 1 tablet (10 mg total) by mouth 2 (two) times daily.      spironolactone 25 MG tablet   Commonly known as: ALDACTONE   Take 1 tablet (25 mg total) by mouth daily.      Duration of  Discharge Encounter: Greater than 30 minutes including physician time.  Signed, Rick Duff, PA-C 05/11/2012, 10:09 AM

## 2012-05-11 NOTE — Op Note (Signed)
NAMEMarland Roth  CELISSE, CIULLA             ACCOUNT NO.:  [id number]  MEDICAL RECORD NO.:  [id number]  LOCATION:  6531                         FACILITY:  [facility]  PHYSICIAN:  Duke Salvia, MD, FACCDATE OF BIRTH:  10-31-1950  DATE OF PROCEDURE:  05/10/2012 DATE OF DISCHARGE:                              OPERATIVE REPORT   PREOPERATIVE DIAGNOSIS:  Nonischemic cardiomyopathy left bundle-branch block.  POSTOPERATIVE DIAGNOSIS:  Nonischemic cardiomyopathy left bundle-branch block.  PROCEDURE:  Dual-chamber defibrillator implantation with left ventricular lead placement.  Following obtaining informed consent, the patient was brought to the electrophysiology laboratory and placed on the fluoroscopic table in supine position.  After routine prep of the left upper chest, lidocaine was infiltrated in the prepectoral subclavicular region.  An incision was made and carried down to layer of the prepectoral fascia with electrocautery and sharp dissection.  A pocket was formed similarly. Hemostasis was obtained.  Thereafter, attention was turned to gain access to the extrathoracic left subclavian vein, which was accomplished without difficulty without the aspiration or puncture of the artery.  Three separate venipunctures were accomplished.  Guidewires were placed and retained and sequentially a 9, 9.5, and 7-French sheath were placed.  At this point, we encountered our first difficulty.  The angle of the innominate vein and the superior vena cava was quite acute.  In fact, I could not pass the regular wire past the juncture.  We then used a Wholey wire to allow access to the distal superior vena cava, and used a long sheath through which we then passed a [employer] coil lead, model 0181 single coil defibrillator lead, serial [id number] to the right ventricular apex where the bipolar R-wave was 8.5 with a pace impedance of 87, threshold 0.5 at 0.5, current threshold is 1.3 mA.  There is  no diaphragmatic pacing at 10 V and the current of injury was brisk.  This lead was secured to the prepectoral fascia.  We then sought to gain access to the coronary sinus.  This turned out to be a major chore.  We ended up using a variety of different sheaths.  We were able to get into the middle cardiac vein, but it was with great difficulty that we finally accessed the body of the coronary sinus. Having done that, however, and taken a venogram, we were quite disheartened as there were no veins that extended over the lateral wall. There were some small veins that we targeted.  We went as far as we could and in these veins the RV and LV interval was less than 30 milliseconds.  We then found a more lateral but relatively short branch and we placed the  lead in this location, and the LV and RV interval was 45 milliseconds.  QRS duration short markedly at this point with biventricular pacing and the implanted lead Medtronic 4396,serial #[id number] demonstrated an amplitude of 60 with a pace impedance of 1299, a threshold of 2.8 at 0.5.  The 9.5-French sheath was removed and the right atrial lead Medtronic 5076, serial #[id number] [id number] was deployed in the right atrial appendage where bipolar P-wave was 4.4 with a pace impedance of 632 with threshold of 0.8 at 0.5.  Current of injury was brisk.  There was no diaphragmatic pacing at 10 V and the current threshold was 1.2 mA.  This lead was secured to the prepectoral fascia. Then, we went to remove the deployment system and I was unable to remove the bowel from the sheath and when I finally got it done, I was very grateful that the lead had not moved.  We then removed the deployment sheath without difficulty.  The leads were secured and then attached to Medtronic Viva XR CRT-D defibrillator, serial #[id number].  Through the device, the bipolar P-wave was 3.5 with a pace impedance of 399, a threshold of 0.75 at 0.4.  The R-wave was 13 with a  pace impedance of 551, a threshold of 0.5 at 0.4.  The LV tip to RV coil configuration had a threshold of 0.5 at 1 with a pace impedance of 608.  The device was implanted.  The pocket was copiously irrigated with antibiotic containing saline solution.  Hemostasis was assured.  The device and leads were placed in the pocket and secured to the prepectoral fascia. I then put some Surgicel on the cephalad aspect of the pocket and closed the pocket in 3 layers in normal fashion.  The wound was washed, dried, and a benzoin and Steri-Strips dressing was applied.  Needle counts, sponge counts, and instrument counts were correct at the end of the procedure according to staff.  The patient tolerated the procedure without apparent complication.     Duke Salvia, MD, [facility]     SCK/MEDQ  D:  05/10/2012  T:  05/11/2012  Job:  [id number]

## 2012-05-11 NOTE — Progress Notes (Signed)
Assessed for utilization review 

## 2012-05-12 ENCOUNTER — Encounter (HOSPITAL_COMMUNITY): Payer: Self-pay | Admitting: Cardiology

## 2012-05-17 ENCOUNTER — Ambulatory Visit: Payer: BC Managed Care – PPO

## 2012-05-19 ENCOUNTER — Ambulatory Visit (INDEPENDENT_AMBULATORY_CARE_PROVIDER_SITE_OTHER): Payer: BC Managed Care – PPO | Admitting: *Deleted

## 2012-05-19 ENCOUNTER — Encounter: Payer: Self-pay | Admitting: Cardiology

## 2012-05-19 ENCOUNTER — Ambulatory Visit (INDEPENDENT_AMBULATORY_CARE_PROVIDER_SITE_OTHER): Payer: BC Managed Care – PPO | Admitting: Cardiology

## 2012-05-19 VITALS — BP 120/74 | HR 87 | Ht 64.5 in | Wt 201.8 lb

## 2012-05-19 DIAGNOSIS — I428 Other cardiomyopathies: Secondary | ICD-10-CM

## 2012-05-19 DIAGNOSIS — I5022 Chronic systolic (congestive) heart failure: Secondary | ICD-10-CM

## 2012-05-19 MED ORDER — CARVEDILOL 12.5 MG PO TABS: ORAL_TABLET | ORAL | Status: DC

## 2012-05-19 MED ORDER — LISINOPRIL 20 MG PO TABS: 20.0000 mg | ORAL_TABLET | Freq: Two times a day (BID) | ORAL | Status: DC

## 2012-05-19 NOTE — Progress Notes (Signed)
Patient ID: Cynthia Roth, female   DOB: [birth year], 61 y.o.   MRN: [id number] PCP: Dr. Alva Garnet  61 yo with history of nonischemic cardiomyopathy presents to the office for followup visit.  Patient was admitted to [facility] in 7/13 with acute CHF.  No prior cardiac history.  No ETOH/Drug history.  No significant family history. No recent viral illness. TSH normal.    She had LBBB on EKG and her echo showed EF 15% with severely dilated LV and diastolic dysfunction. LHC showed no significant coronary disease.  Repeat echo in 10/13 on good medical therapy showed persistent EF 15%.  She had Medtronic CRT-D device placed earlier this month by Dr. Graciela Husbands.   Overall, she has been doing well.  She is up 5 lbs and attributes this to dietary indiscretion with the holidays.  She denies exertional dyspnea.  She is working full-time.  No lightheadedness with standing. No chest pain, orthopnea, PND, or syncope.  She takes Lasix about once a week.   Labs: K 3.7, Creatinine 0.72, BNP 3968 (11/29/11) Labs: K 3.7, Creatinine 0.84, BNP 794, LDL 125, SPEP negative, TSH normal, urine immunofixation negative (12/02/11) Labs: K 4.0, Creatinine 0.8 (12/08/11) Labs: K 3.9, creatinine 0.8 (9/13) Labs: K 3.8, creatinine 0.7 (12/13)  ROS: All systems reviewed and negative except as per HPI.   Allergies: NKDA  Past Medical History: 1. CHF and Nonischemic cardiomyopathy: patient was admitted with acute CHF on 11/29/11 and Echo showed EF 15% with diffuse hypokinesis, severely dilated LV, severe diastolic dysfunction. Left heart cath showed no angiographic CAD.  TSH, SPEP, urine immunofixation were all within normal range.  Repeat echo 10/13 with EF 15%, diffuse hypokinesis.  Medtronic CRT-D device placed 12/13 Graciela Husbands).  2. LBBB: noted on hospital admission on 11/29/11. No old EKG. 3. HTN 4. Dyslipidemia 5. Tubular adenoma:  Last colonoscopy in 1999. Followed by GI Dr. Loreta Ave.  Past Surgical History  Procedure Date  .  Cardiac catheterization 12-03-11    selectie coronary angiography  . Cardiac defibrillator placement 05/10/2012  . Insert / replace / remove pacemaker 05/10/2012    CRT-D implantation (05/10/2012)  . Abdominal hysterectomy 1980's    partial   Family History  Problem Relation Age of Onset  . Heart failure Maternal Grandmother     diagnosed in age of 10's. still living at age of 60's.  . Colon cancer      family history  . Angina Mother     diagnosed at age of 93's, unsure whether it was a true diagnosis. unsure about the treatment    History   Social History  . Marital Status: Divorced    Spouse Name: N/A    Number of Children: N/A  . Years of Education: N/A   Occupational History  . Not on file.   Social History Main Topics  . Smoking status: Never Smoker   . Smokeless tobacco: Never Used  . Alcohol Use: No  . Drug Use: No  . Sexually Active: No   Other Topics Concern  . Not on file   Social History Narrative   Lives by herself in [place]. Divorced with one adult child who lives in [place]. She has two brothers living in [place]. 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    Current Outpatient Prescriptions on File Prior to Visit  Medication Sig Dispense Refill  . aspirin EC 81 MG EC tablet Take 1 tablet (81 mg total) by  mouth daily.      . carvedilol (COREG) 12.5 MG tablet Take 1 tablet (12.5 mg total) by mouth 2 (two) times daily.  60 tablet  3  . furosemide (LASIX) 20 MG tablet Take 1 tablet (20 mg total) by mouth daily as needed.  30 tablet  6  . lisinopril (PRINIVIL,ZESTRIL) 10 MG tablet Take 1 tablet (10 mg total) by mouth 2 (two) times daily.  60 tablet  6  . spironolactone (ALDACTONE) 25 MG tablet Take 1 tablet (25 mg total) by mouth daily.  30 tablet  11   Filed Vitals:   05/19/12 0913  BP: 120/74  Pulse: 87  Height: 5' 4.5" (1.638 m)  Weight: 201 lb 12.8 oz (91.536 kg)  SpO2: 98%   General: NAD Neck:  No JVD, no thyromegaly or thyroid nodule.  Lungs: Clear to auscultation bilaterally with normal respiratory effort. CV: Nondisplaced PMI.  Heart regular S1/S2, no S3/S4, no murmur.  No peripheral edema.  No carotid bruit.  Normal pedal pulses.  Abdomen: Soft, nontender, no hepatosplenomegaly, no distention.  Neurologic: Alert and oriented x 3.  Psych: Normal affect. Extremities: No clubbing or cyanosis.  Assessment/Plan:  Systolic heart failure  Patient has a nonischemic CMP of uncertain etiology. TSH and serum/urine immunofixations were normal. Possible etiologies include viral myocarditis or LBBB cardiomyopathy. She is doing well with NYHA class II symptoms. She is not volume overloaded.  She has had recent placement of CRT-D device.  - Continue lisinopril and spironolactone at current doses.  - Increase Coreg to 18.75 mg bid.    - Device check today.  - I will repeat an echo in 4-5 months.  If EF has not improved, will add Bidil to her regimen.  - Followup in 3 months with BMET.   Marca Ancona 05/19/2012 9:31 AM

## 2012-05-19 NOTE — Patient Instructions (Addendum)
Increase coreg (carvedilol) to 18.75mg  two times a day. This will be 1 and 1/2 of a 12.5mg  tablet two times a day.    Your physician recommends that you schedule a follow-up appointment in: 3 months with Dr Shirlee Latch.

## 2012-05-19 NOTE — Progress Notes (Signed)
Wound check-ICD 

## 2012-06-07 ENCOUNTER — Encounter: Payer: Self-pay | Admitting: Internal Medicine

## 2012-06-07 ENCOUNTER — Ambulatory Visit (INDEPENDENT_AMBULATORY_CARE_PROVIDER_SITE_OTHER): Payer: BC Managed Care – PPO | Admitting: Internal Medicine

## 2012-06-07 VITALS — BP 112/66 | HR 83 | Ht 64.0 in | Wt 202.6 lb

## 2012-06-07 DIAGNOSIS — I428 Other cardiomyopathies: Secondary | ICD-10-CM

## 2012-06-07 DIAGNOSIS — I447 Left bundle-branch block, unspecified: Secondary | ICD-10-CM

## 2012-06-07 DIAGNOSIS — I5022 Chronic systolic (congestive) heart failure: Secondary | ICD-10-CM

## 2012-06-07 DIAGNOSIS — I5020 Unspecified systolic (congestive) heart failure: Secondary | ICD-10-CM

## 2012-06-07 DIAGNOSIS — Z95810 Presence of automatic (implantable) cardiac defibrillator: Secondary | ICD-10-CM

## 2012-06-07 LAB — ICD DEVICE OBSERVATION
AL AMPLITUDE: 3.4 mv
AL IMPEDENCE ICD: 418 Ohm
AL THRESHOLD: 0.5 V
ATRIAL PACING ICD: 0.2 pct
DEV-0020ICD: NEGATIVE
HV IMPEDENCE: 63 Ohm
LV LEAD IMPEDENCE ICD: 475 Ohm
LV LEAD THRESHOLD: 1 V
RV LEAD AMPLITUDE: 20 mv
RV LEAD IMPEDENCE ICD: 513 Ohm
RV LEAD THRESHOLD: 0.75 V
TZON-0003SLOWVT: 300 ms
VENTRICULAR PACING ICD: 99.3 pct

## 2012-06-07 LAB — BASIC METABOLIC PANEL
BUN: 17 mg/dL (ref 6–23)
CO2: 30 mEq/L (ref 19–32)
Calcium: 9.6 mg/dL (ref 8.4–10.5)
Chloride: 104 mEq/L (ref 96–112)
Creatinine, Ser: 0.8 mg/dL (ref 0.4–1.2)
GFR: 95.27 mL/min (ref >60.00)
Glucose, Bld: 83 mg/dL (ref 70–99)
Potassium: 3.9 mEq/L (ref 3.5–5.1)
Sodium: 140 mEq/L (ref 135–145)

## 2012-06-07 NOTE — Progress Notes (Signed)
Patient Care Team: Merlene Laughter. Renae Gloss, MD as PCP - General (Internal Medicine)   HPI  Cynthia Roth is a 62 y.o. female Seen in followup for congestive heart failure with CRT-D implantation for nonischemic cardiomyopathy 12/13  She is considerably improved with improved exercise tolerance; no edema or lightheadedness  Past Medical History  Diagnosis Date  . GERD (gastroesophageal reflux disease)   . Hx of colonic polyps   . Tubular adenoma 1999    history of  . Nonischemic cardiomyopathy   . CHF (congestive heart failure) 12/02/2011  . LBBB (left bundle branch block) 12/02/2011  . Dyslipidemia 12/02/2011  . Hypertension     "used to have this; now my BP is low" (05/10/2012)  . SOB (shortness of breath) 12/02/2011  . Exertional dyspnea     "very seldom" (05/10/2012)  . Gout     "very seldom" (05/10/2012)  . Biventricular ICD -Medtronic     DOI 12/13 - Medtronic Viva XR CRT-D defibrillator, serial #[id number] [id number].      Past Surgical History  Procedure Date  . Cardiac catheterization 12-03-11    selectie coronary angiography  . Cardiac defibrillator placement 05/10/2012  . Insert / replace / remove pacemaker 05/10/2012    CRT-D implantation (05/10/2012)  . Abdominal hysterectomy 1980's    partial    Current Outpatient Prescriptions  Medication Sig Dispense Refill  . aspirin EC 81 MG EC tablet Take 1 tablet (81 mg total) by mouth daily.      . carvedilol (COREG) 12.5 MG tablet 1 and 1/2 tablets (total 18.75mg  ) two times a day  90 tablet  6  . furosemide (LASIX) 20 MG tablet Take 1 tablet (20 mg total) by mouth daily as needed.  30 tablet  6  . lisinopril (PRINIVIL,ZESTRIL) 10 MG tablet Take 1 tablet (10 mg total) by mouth 2 (two) times daily.      [name] spironolactone (ALDACTONE) 25 MG tablet Take 1 tablet (25 mg total) by mouth daily.  30 tablet  11    No Known Allergies  Review of Systems negative except from HPI and PMH  Physical Exam BP 112/66  Pulse 83  Ht 5\' 4"   (1.626 m)  Wt 202 lb 9.6 oz (91.899 kg)  BMI 34.78 kg/m2 Well developed and well nourished in no acute distress HENT normal E scleral and icterus clear Neck Supple JVP flat; carotids brisk and full Clear to ausculation  Regular rate and rhythm, no murmurs gallops or rub Soft with active bowel sounds No clubbing cyanosis none Edema Alert and oriented, grossly normal motor and sensory function Skin Warm and Dry  Post procedure ECG demonstrates P. Synchronous pacing QRS duration was still about 150  Assessment and  Plan

## 2012-06-07 NOTE — Assessment & Plan Note (Signed)
Continue current medications. I wonder, whether as African American, she should be benefited by BiDil. Will defer this to Dr. Gust Rung

## 2012-06-07 NOTE — Assessment & Plan Note (Signed)
She is improved from functional class III-functional class II. We will check a metabolic today to make sure that she is not overdiuresis this time she is clearly euvoliemic

## 2012-06-07 NOTE — Assessment & Plan Note (Signed)
reprogrammed

## 2012-06-07 NOTE — Patient Instructions (Signed)
Your physician recommends that you have lab work today: bmp  Your physician recommends that you schedule a follow-up appointment as scheduled on Monday August 14, 2012 at 11:45 am with Dr. Shirlee Latch.  Your physician wants you to follow-up in: June with Kristin/Paula for a device check & December 2014 with Dr. Graciela Husbands. You will receive a reminder letter in the mail two months in advance. If you don't receive a letter, please call our office to schedule the follow-up appointment.

## 2012-06-08 ENCOUNTER — Encounter: Payer: Self-pay | Admitting: Internal Medicine

## 2012-08-14 ENCOUNTER — Ambulatory Visit (INDEPENDENT_AMBULATORY_CARE_PROVIDER_SITE_OTHER): Payer: BC Managed Care – PPO | Admitting: Cardiology

## 2012-08-14 ENCOUNTER — Encounter: Payer: Self-pay | Admitting: Cardiology

## 2012-08-14 VITALS — BP 102/62 | HR 71 | Ht 64.0 in | Wt 211.0 lb

## 2012-08-14 DIAGNOSIS — I5022 Chronic systolic (congestive) heart failure: Secondary | ICD-10-CM

## 2012-08-14 DIAGNOSIS — I447 Left bundle-branch block, unspecified: Secondary | ICD-10-CM

## 2012-08-14 NOTE — Patient Instructions (Addendum)
Your physician has requested that you have an echocardiogram. Echocardiography is a painless test that uses sound waves to create images of your heart. It provides your doctor with information about the size and shape of your heart and how well your hearts chambers and valves are working. This procedure takes approximately one hour. There are no restrictions for this procedure.  Your physician recommends that you return for lab work in: April for BMET  Your physician wants you to follow-up in: 6 months with Dr. Earlean Shawl will receive a reminder letter in the mail two months in advance. If you don't receive a letter, please call our office to schedule the follow-up appointment.

## 2012-08-14 NOTE — Progress Notes (Signed)
Patient ID: Cynthia Roth, female   DOB: 01-13-1951, 62 y.o.   MRN: [id number] PCP: Dr. Alva Garnet  62 yo with history of nonischemic cardiomyopathy presents to the office for followup visit.  Patient was admitted to [facility] in 7/13 with acute CHF.  No prior cardiac history.  No ETOH/Drug history.  No significant family history. No recent viral illness. TSH normal.    She had LBBB on EKG and her echo showed EF 15% with severely dilated LV and diastolic dysfunction. LHC showed no significant coronary disease.  Repeat echo in 10/13 on good medical therapy showed persistent EF 15%.  She had Medtronic CRT-D device placed in 12/13 by Dr. Graciela Husbands.   Overall, she has been doing well.  She is up 5 lbs and attributes this to dietary indiscretion and lack of exercise because of the weather. She denies exertional dyspnea.  She is working full-time.  No lightheadedness with standing. No chest pain, orthopnea, PND, or syncope.  She takes Lasix about once a week.   Labs: K 3.7, Creatinine 0.72, BNP 3968 (11/29/11) Labs: K 3.7, Creatinine 0.84, BNP 794, LDL 125, SPEP negative, TSH normal, urine immunofixation negative (12/02/11) Labs: K 4.0, Creatinine 0.8 (12/08/11) Labs: K 3.9, creatinine 0.8 (9/13) Labs: K 3.8, creatinine 0.7 (12/13) Labs: K 3.9, creatinine 0.8 (1/14)  Allergies: NKDA  Past Medical History: 1. CHF and Nonischemic cardiomyopathy: patient was admitted with acute CHF on 11/29/11 and Echo showed EF 15% with diffuse hypokinesis, severely dilated LV, severe diastolic dysfunction. Left heart cath showed no angiographic CAD.  TSH, SPEP, urine immunofixation were all within normal range.  Repeat echo 10/13 with EF 15%, diffuse hypokinesis.  Medtronic CRT-D device placed 12/13 Graciela Husbands).  2. LBBB: noted on hospital admission on 11/29/11. No old EKG. 3. HTN 4. Dyslipidemia 5. Tubular adenoma:  Last colonoscopy in 1999. Followed by GI Dr. Loreta Ave.  Past Surgical History  Procedure Laterality Date  .  Cardiac catheterization  12-03-11    selectie coronary angiography  . Cardiac defibrillator placement  05/10/2012  . Insert / replace / remove pacemaker  05/10/2012    CRT-D implantation (05/10/2012)  . Abdominal hysterectomy  1980's    partial   Family History  Problem Relation Age of Onset  . Heart failure Maternal Grandmother     diagnosed in age of 53's. still living at age of 28's.  . Colon cancer      family history  . Angina Mother     diagnosed at age of 75's, unsure whether it was a true diagnosis. unsure about the treatment    History   Social History  . Marital Status: Divorced    Spouse Name: N/A    Number of Children: N/A  . Years of Education: N/A   Occupational History  . Not on file.   Social History Main Topics  . Smoking status: Never Smoker   . Smokeless tobacco: Never Used  . Alcohol Use: No  . Drug Use: No  . Sexually Active: No   Other Topics Concern  . Not on file   Social History Narrative   Lives by herself in [place]. Divorced with one adult child who lives in [place]. She has two brothers living in [place]. 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    Current Outpatient Prescriptions on File Prior to Visit  Medication Sig Dispense Refill  . aspirin EC 81 MG EC tablet Take 1 tablet (81 mg  total) by mouth daily.      . carvedilol (COREG) 12.5 MG tablet 1 and 1/2 tablets (total 18.75mg  ) two times a day  90 tablet  6  . furosemide (LASIX) 20 MG tablet Take 1 tablet (20 mg total) by mouth daily as needed.  30 tablet  6  . lisinopril (PRINIVIL,ZESTRIL) 10 MG tablet Take 1 tablet (10 mg total) by mouth 2 (two) times daily.      [name] spironolactone (ALDACTONE) 25 MG tablet Take 1 tablet (25 mg total) by mouth daily.  30 tablet  11   No current facility-administered medications on file prior to visit.   Filed Vitals:   08/14/12 1159  BP: 102/62  Pulse: 71  Height: 5\' 4"  (1.626 m)  Weight: 211 lb  (95.709 kg)  SpO2: 98%   General: NAD Neck: No JVD, no thyromegaly or thyroid nodule.  Lungs: Clear to auscultation bilaterally with normal respiratory effort. CV: Nondisplaced PMI.  Heart regular S1/S2, no S3/S4, no murmur.  No peripheral edema.  No carotid bruit.  Normal pedal pulses.  Abdomen: Soft, nontender, no hepatosplenomegaly, no distention.  Neurologic: Alert and oriented x 3.  Psych: Normal affect. Extremities: No clubbing or cyanosis.  Assessment/Plan:  Systolic heart failure  Patient has a nonischemic CMP of uncertain etiology. TSH and serum/urine immunofixations were normal. Possible etiologies include viral myocarditis or LBBB cardiomyopathy. She is doing well with NYHA class II symptoms. She is not volume overloaded.  She has had recent placement of CRT-D device.  - Continue lisinopril, Coreg, and spironolactone at current doses.  - I will repeat an echo next month.  If EF has not improved, will add Bidil to her regimen.  - BMET in 4/14.   Marca Ancona 08/14/2012 11:14 PM

## 2012-08-21 ENCOUNTER — Ambulatory Visit (HOSPITAL_COMMUNITY): Payer: BC Managed Care – PPO

## 2012-08-21 ENCOUNTER — Other Ambulatory Visit (HOSPITAL_COMMUNITY): Payer: Self-pay | Admitting: Cardiology

## 2012-08-21 DIAGNOSIS — I509 Heart failure, unspecified: Secondary | ICD-10-CM

## 2012-08-23 ENCOUNTER — Ambulatory Visit (HOSPITAL_COMMUNITY): Payer: BC Managed Care – PPO | Attending: Cardiology

## 2012-08-23 DIAGNOSIS — I509 Heart failure, unspecified: Secondary | ICD-10-CM | POA: Insufficient documentation

## 2012-08-23 DIAGNOSIS — I447 Left bundle-branch block, unspecified: Secondary | ICD-10-CM | POA: Insufficient documentation

## 2012-08-23 DIAGNOSIS — I10 Essential (primary) hypertension: Secondary | ICD-10-CM | POA: Insufficient documentation

## 2012-08-23 DIAGNOSIS — I428 Other cardiomyopathies: Secondary | ICD-10-CM | POA: Insufficient documentation

## 2012-08-23 NOTE — Progress Notes (Signed)
Echocardiogram performed.  

## 2012-09-11 ENCOUNTER — Other Ambulatory Visit (INDEPENDENT_AMBULATORY_CARE_PROVIDER_SITE_OTHER): Payer: BC Managed Care – PPO

## 2012-09-11 DIAGNOSIS — I5022 Chronic systolic (congestive) heart failure: Secondary | ICD-10-CM

## 2012-09-11 LAB — BASIC METABOLIC PANEL
BUN: 24 mg/dL — ABNORMAL HIGH (ref 6–23)
CO2: 28 mEq/L (ref 19–32)
Calcium: 9.4 mg/dL (ref 8.4–10.5)
Chloride: 106 mEq/L (ref 96–112)
Creatinine, Ser: 1 mg/dL (ref 0.4–1.2)
GFR: 76.94 mL/min (ref >60.00)
Glucose, Bld: 98 mg/dL (ref 70–99)
Potassium: 4.4 mEq/L (ref 3.5–5.1)
Sodium: 140 mEq/L (ref 135–145)

## 2012-10-02 ENCOUNTER — Other Ambulatory Visit: Payer: Self-pay | Admitting: Cardiology

## 2012-11-13 ENCOUNTER — Emergency Department (HOSPITAL_COMMUNITY): Payer: BC Managed Care – PPO

## 2012-11-13 ENCOUNTER — Encounter (HOSPITAL_COMMUNITY): Payer: Self-pay | Admitting: *Deleted

## 2012-11-13 ENCOUNTER — Emergency Department (HOSPITAL_COMMUNITY)
Admission: EM | Admit: 2012-11-13 | Discharge: 2012-11-14 | Disposition: A | Discharge status: Home / Self Care | Payer: BC Managed Care – PPO | Attending: Emergency Medicine | Admitting: Emergency Medicine

## 2012-11-13 DIAGNOSIS — Z79899 Other long term (current) drug therapy: Secondary | ICD-10-CM | POA: Insufficient documentation

## 2012-11-13 DIAGNOSIS — Z8719 Personal history of other diseases of the digestive system: Secondary | ICD-10-CM | POA: Insufficient documentation

## 2012-11-13 DIAGNOSIS — I509 Heart failure, unspecified: Secondary | ICD-10-CM | POA: Insufficient documentation

## 2012-11-13 DIAGNOSIS — J019 Acute sinusitis, unspecified: Secondary | ICD-10-CM | POA: Insufficient documentation

## 2012-11-13 DIAGNOSIS — Z85038 Personal history of other malignant neoplasm of large intestine: Secondary | ICD-10-CM | POA: Insufficient documentation

## 2012-11-13 DIAGNOSIS — I5022 Chronic systolic (congestive) heart failure: Secondary | ICD-10-CM | POA: Insufficient documentation

## 2012-11-13 DIAGNOSIS — R070 Pain in throat: Secondary | ICD-10-CM

## 2012-11-13 DIAGNOSIS — Z7982 Long term (current) use of aspirin: Secondary | ICD-10-CM | POA: Insufficient documentation

## 2012-11-13 DIAGNOSIS — H9201 Otalgia, right ear: Secondary | ICD-10-CM

## 2012-11-13 DIAGNOSIS — J329 Chronic sinusitis, unspecified: Secondary | ICD-10-CM

## 2012-11-13 DIAGNOSIS — E785 Hyperlipidemia, unspecified: Secondary | ICD-10-CM | POA: Insufficient documentation

## 2012-11-13 DIAGNOSIS — H9209 Otalgia, unspecified ear: Secondary | ICD-10-CM | POA: Insufficient documentation

## 2012-11-13 LAB — CBC WITH DIFFERENTIAL/PLATELET
Basophils Absolute: 0.1 10*3/uL (ref 0.0–0.1)
Basophils Relative: 1 % (ref 0–1)
Eosinophils Absolute: 0.1 10*3/uL (ref 0.0–0.7)
Eosinophils Relative: 1 % (ref 0–5)
HCT: 37.2 % (ref 36.0–46.0)
Hemoglobin: 13.4 g/dL (ref 12.0–15.0)
Lymphocytes Relative: 43 % (ref 12–46)
Lymphs Abs: 2.6 10*3/uL (ref 0.7–4.0)
MCH: 33.2 pg (ref 26.0–34.0)
MCHC: 36 g/dL (ref 30.0–36.0)
MCV: 92.1 fL (ref 78.0–100.0)
Monocytes Absolute: 0.6 10*3/uL (ref 0.1–1.0)
Monocytes Relative: 10 % (ref 3–12)
Neutro Abs: 2.6 10*3/uL (ref 1.7–7.7)
Neutrophils Relative %: 45 % (ref 43–77)
Platelets: 254 10*3/uL (ref 150–400)
RBC: 4.04 MIL/uL (ref 3.87–5.11)
RDW: 12.7 % (ref 11.5–15.5)
WBC: 6 10*3/uL (ref 4.0–10.5)

## 2012-11-13 LAB — BASIC METABOLIC PANEL
BUN: 37 mg/dL — ABNORMAL HIGH (ref 6–23)
CO2: 25 mEq/L (ref 19–32)
Calcium: 10.5 mg/dL (ref 8.4–10.5)
Chloride: 98 mEq/L (ref 96–112)
Creatinine, Ser: 1.49 mg/dL — ABNORMAL HIGH (ref 0.50–1.10)
GFR calc Af Amer: 43 mL/min — ABNORMAL LOW (ref >90)
GFR calc non Af Amer: 37 mL/min — ABNORMAL LOW (ref >90)
Glucose, Bld: 96 mg/dL (ref 70–99)
Potassium: 4.5 mEq/L (ref 3.5–5.1)
Sodium: 136 mEq/L (ref 135–145)

## 2012-11-13 LAB — RAPID STREP SCREEN (MED CTR MEBANE ONLY): Streptococcus, Group A Screen (Direct): NEGATIVE

## 2012-11-13 NOTE — ED Notes (Signed)
Patient c/o sore throat for the last several days.  States she has trouble swallowing solid food.

## 2012-11-13 NOTE — ED Provider Notes (Signed)
History    This chart was scribed for Francee Piccolo a non-physician practitioner working with No att. providers found by Lewanda Rife, ED Scribe. This patient was seen in room TR09C/TR09C and the patient's care was started at 2143.     CSN: [id number]  Arrival date & time 11/13/12  7829   First MD Initiated Contact with Patient 11/13/12 2132      Chief Complaint  Patient presents with  . Sore Throat    (Consider location/radiation/quality/duration/timing/severity/associated sxs/prior treatment) HPI HPI Comments: Cynthia Roth is a 62 y.o. female who presents to the Emergency Department complaining of worsening constant sore throat onset 7 days. Reports associated hoarse voice, right otalgia, lightheadedness w/o syncope, generalized weakness, temporal headache, pain with swallowing, subjective fever, chills, ear drainage, fatigue, and decreased appetite secondary to pain. Denies aggravating or alleviating factors. Denies difficulty swallowing, shortness of breath, visual disturbance, abdominal pain, dysura  Describes pain 6/10 in severity.  Reports getting dx with strep throat and taking prescribed penicillin for past 7 days. Reports hx of vertigo.   Past Medical History  Diagnosis Date  . GERD (gastroesophageal reflux disease)   . Hx of colonic polyps   . Tubular adenoma 1999    history of  . Nonischemic cardiomyopathy   . Chronic systolic heart failure 12/02/2011  . LBBB (left bundle branch block) 12/02/2011  . Dyslipidemia 12/02/2011  . Hypertension     "used to have this; now my BP is low" (05/10/2012)  . Gout     "very seldom" (05/10/2012)  . Biventricular ICD -Medtronic     DOI 12/13 - Medtronic Viva XR CRT-D defibrillator, serial #[id number].    . CHF (congestive heart failure)     Past Surgical History  Procedure Laterality Date  . Cardiac catheterization  12-03-11    selectie coronary angiography  . Cardiac defibrillator placement  05/10/2012  . Insert  / replace / remove pacemaker  05/10/2012    CRT-D implantation (05/10/2012)  . Abdominal hysterectomy  1980's    partial    Family History  Problem Relation Age of Onset  . Heart failure Maternal Grandmother     diagnosed in age of 40's. still living at age of 58's.  . Colon cancer      family history  . Angina Mother     diagnosed at age of 37's, unsure whether it was a true diagnosis. unsure about the treatment    History  Substance Use Topics  . Smoking status: Never Smoker   . Smokeless tobacco: Never Used  . Alcohol Use: No    OB History   Grav Para Term Preterm Abortions TAB SAB Ect Mult Living                  Review of Systems  Constitutional: Negative for fever and chills.  HENT: Positive for ear pain and trouble swallowing. Negative for hearing loss, nosebleeds, congestion, sore throat, facial swelling, rhinorrhea, drooling, neck pain and neck stiffness.   Eyes: Negative.   Respiratory: Negative.   Cardiovascular: Negative.   Gastrointestinal: Negative.   Genitourinary: Negative.   Musculoskeletal: Negative.   Skin: Negative.   Neurological: Positive for light-headedness and headaches.    Allergies  Review of patient's allergies indicates no known allergies.  Home Medications   Current Outpatient Rx  Name  Route  Sig  Dispense  Refill  . aspirin EC 81 MG EC tablet   Oral   Take 1 tablet (81 mg total) by  mouth daily.         . carvedilol (COREG) 12.5 MG tablet   Oral   Take 18.75 mg by mouth 2 (two) times daily with a meal. 1 and 1/2 tablets (total 18.75mg  ) two times a day         . furosemide (LASIX) 20 MG tablet   Oral   Take 20 mg by mouth daily as needed. Usually takes on Tues & Thurs         . lidocaine (XYLOCAINE) 2 % solution   Oral   Take 15 mLs by mouth as needed for pain. For sore throat         . lisinopril (PRINIVIL,ZESTRIL) 20 MG tablet   Oral   Take 20 mg by mouth 2 (two) times daily.         . penicillin v  potassium (VEETID) 500 MG tablet   Oral   Take 500 mg by mouth every 12 (twelve) hours.         [name] spironolactone (ALDACTONE) 25 MG tablet   Oral   Take 1 tablet (25 mg total) by mouth daily.   30 tablet   11     BP 95/47  Pulse 89  Temp(Src) 97.6 F (36.4 C) (Oral)  Resp 20  SpO2 98%  Physical Exam  Nursing note and vitals reviewed. Constitutional: She is oriented to person, place, and time. She appears well-developed and well-nourished. No distress.  HENT:  Head: Normocephalic and atraumatic.  Right Ear: External ear normal. Tympanic membrane is erythematous. Tympanic membrane is not perforated and not retracted.  Left Ear: Tympanic membrane and external ear normal. Tympanic membrane is not perforated and not erythematous.  Mouth/Throat: Oropharynx is clear and moist. No oropharyngeal exudate.  Eyes: Conjunctivae and EOM are normal.  Neck: Neck supple. No tracheal deviation present.  Cardiovascular: Normal rate.   Pulmonary/Chest: Effort normal. No respiratory distress.  Musculoskeletal: Normal range of motion.  Neurological: She is alert and oriented to person, place, and time. She has normal strength. No cranial nerve deficit or sensory deficit. Gait normal.  Skin: Skin is warm and dry.  Psychiatric: She has a normal mood and affect. Her behavior is normal.    ED Course  Procedures (including critical care time) Medications  iohexol (OMNIPAQUE) 300 MG/ML solution 100 mL (100 mLs Intravenous Contrast Given 11/14/12 0025)    Labs Reviewed  BASIC METABOLIC PANEL - Abnormal; Notable for the following:    BUN 37 (*)    Creatinine, Ser 1.49 (*)    GFR calc non Af Amer 37 (*)    GFR calc Af Amer 43 (*)    All other components within normal limits  RAPID STREP SCREEN  CULTURE, GROUP A STREP  CBC WITH DIFFERENTIAL  URINALYSIS, ROUTINE W REFLEX MICROSCOPIC   Dg Neck Soft Tissue  11/13/2012   *RADIOLOGY REPORT*  Clinical Data: Throat pain.  NECK SOFT TISSUES - 1+ VIEW   Comparison: No priors.  Findings: Single lateral view of the neck demonstrates no prevertebral soft tissue swelling.  Hypopharynx, supraglottic and subglottic airway are normal in appearance.  Small calcific densities project over the prevertebral soft tissues anterior to C4, likely to represent atherosclerotic calcifications.  IMPRESSION: 1.  No acute radiographic abnormality of the soft tissues of the neck.   Original Report Authenticated By: Trudie Reed, M.D.   Ct Soft Tissue Neck W Contrast  11/14/2012   *RADIOLOGY REPORT*  Clinical Data: Sore throat  CT NECK WITH CONTRAST  Technique:  Multidetector CT imaging of the neck was performed with intravenous contrast.  Contrast: OMNIPAQUE IOHEXOL 300 MG/ML  SOLN  Comparison: soft tissue neck 11/13/2012  Findings: Incidentally imaged portion of the brain shows a normal fourth ventricle.  No evidence of midline shift.  There is polypoid mucosal thickening both maxillary sinuses.  There are frothy secretions in the left sphenoid sinus.  Mastoid air cells are clear.  No mass lesion, fluid, abscess, or lymphadenopathy is identified.  There is focal atherosclerotic calcification at the bifurcation of the left carotid artery, without evidence of hemodynamically significant stenosis.  There is atherosclerotic calcification at the bifurcation of the right carotid artery without evidence of hemodynamically significant stenosis.  Both jugular veins are patent.  Thyroid gland is normal.  Negative for prevertebral edema.  There is some streak artifact from several dental fillings. Visualized facial bones are intact.  Left chest wall pacer device present, with pacer leads partially visualized.  Cervical spine vertebral bodies are normal in height alignment. There is mild disc space narrowing at C4-5 and C5-6, with mild posterior disc osteophyte complexes at these levels.  No acute or suspicious bony abnormality.  Congenital incomplete fusion of this posterior C1 arch.   IMPRESSION: 1.  Acute left sphenoid sinusitis.  Probable chronic bilateral maxillary sinus disease. 2.   Negative for mass or abscess.  No acute soft tissue inflammatory changes. 3.  Atherosclerotic changes of the bilateral carotid arteries at the bifurcation.   Original Report Authenticated By: Britta Mccreedy, M.D.     1. Otalgia, right   2. Throat discomfort       MDM  PE unremarkable. Patient awaiting CT scan at time of shift change. Patient signed out to [employer], PA-C. Pt case has been discussed with Dr. Ranae Palms w/ plan pending CT results. Pt reliable for outpatient follow up with PCP if CT scan results allow discharge.       I personally performed the services described in this documentation, which was scribed in my presence. The recorded information has been reviewed and is accurate.      Jeannetta Ellis, PA-C 11/14/12 0147

## 2012-11-13 NOTE — ED Notes (Signed)
Pt is aware of the urine sample needed, pt stated she already used the bathroom at this time

## 2012-11-13 NOTE — ED Notes (Signed)
Patient unable to urinate at this time.  States she has not been drinking alot

## 2012-11-13 NOTE — ED Notes (Signed)
[facility] and was told she had strep, has been taking meds as prescribed but still having sore throat. Also now having headache, not eating, fatigue and right ear pain. Airway intact at triage.

## 2012-11-14 ENCOUNTER — Emergency Department (HOSPITAL_COMMUNITY): Payer: BC Managed Care – PPO

## 2012-11-14 MED ORDER — IOHEXOL 300 MG/ML  SOLN
100.0000 mL | Freq: Once | INTRAMUSCULAR | Status: AC | PRN
  Administered 2012-11-14: 100 mL via INTRAVENOUS

## 2012-11-14 NOTE — ED Provider Notes (Signed)
Cynthia Roth S 1:00 AM patient discussed in sign out with Francee Piccolo PA-C. Patient complaining of globular sensation in throat. Also has recent diagnosis of strep throat currently nearing the end of her treatment course of penicillin. Some of her symptoms of sore throat have improved with the penicillin treatment. She has not had difficulty swallowing or breathing. Soft tissue x-ray of the neck unremarkable. Patient was sent for additional imaging with a CT scan to rule out abscess. Results are pending.  Patient is afebrile has normal WBC. No difficulties swallowing or breathing. No masses palpated on the neck. Her CT scan is unremarkable for any abscesses or other cause of a lump in the throat. She does have signs of sinusitis. Findings were shared with the patient she is comfortable to followup with PCP.  Angus Seller, PA-C 11/14/12 2127

## 2012-11-15 LAB — CULTURE, GROUP A STREP

## 2012-11-15 NOTE — ED Provider Notes (Signed)
Medical screening examination/treatment/procedure(s) were performed by non-physician practitioner and as supervising physician I was immediately available for consultation/collaboration.  Sunnie Nielsen, MD 11/15/12 0005

## 2012-11-15 NOTE — ED Provider Notes (Signed)
Medical screening examination/treatment/procedure(s) were performed by non-physician practitioner and as supervising physician I was immediately available for consultation/collaboration.  Cloyd Ragas, MD 11/15/12 0005 

## 2012-11-16 ENCOUNTER — Telehealth: Payer: Self-pay | Admitting: Cardiology

## 2012-11-16 NOTE — Telephone Encounter (Signed)
New Problem  Pt states she is having some problems but she would not go into detail. She said she is unsure if she needs to be seen here or contact her PCP. She said she believes it is involving her CHF.

## 2012-11-16 NOTE — Telephone Encounter (Signed)
Pt c/o gi upset since starting ABX for strep throat.  Advised pt to follow up with PCP.  Pt agreed.

## 2012-11-17 ENCOUNTER — Encounter (HOSPITAL_BASED_OUTPATIENT_CLINIC_OR_DEPARTMENT_OTHER): Payer: Self-pay | Admitting: *Deleted

## 2012-11-17 ENCOUNTER — Emergency Department (HOSPITAL_BASED_OUTPATIENT_CLINIC_OR_DEPARTMENT_OTHER): Payer: BC Managed Care – PPO

## 2012-11-17 ENCOUNTER — Emergency Department (HOSPITAL_BASED_OUTPATIENT_CLINIC_OR_DEPARTMENT_OTHER)
Admission: EM | Admit: 2012-11-17 | Discharge: 2012-11-17 | Disposition: A | Discharge status: Home / Self Care | Payer: BC Managed Care – PPO | Attending: Emergency Medicine | Admitting: Emergency Medicine

## 2012-11-17 DIAGNOSIS — Z4502 Encounter for adjustment and management of automatic implantable cardiac defibrillator: Secondary | ICD-10-CM

## 2012-11-17 DIAGNOSIS — Z8679 Personal history of other diseases of the circulatory system: Secondary | ICD-10-CM | POA: Insufficient documentation

## 2012-11-17 DIAGNOSIS — Z9861 Coronary angioplasty status: Secondary | ICD-10-CM | POA: Insufficient documentation

## 2012-11-17 DIAGNOSIS — Z862 Personal history of diseases of the blood and blood-forming organs and certain disorders involving the immune mechanism: Secondary | ICD-10-CM | POA: Insufficient documentation

## 2012-11-17 DIAGNOSIS — Z8719 Personal history of other diseases of the digestive system: Secondary | ICD-10-CM | POA: Insufficient documentation

## 2012-11-17 DIAGNOSIS — Z860100 Personal history of colon polyps, unspecified: Secondary | ICD-10-CM | POA: Insufficient documentation

## 2012-11-17 DIAGNOSIS — Z8639 Personal history of other endocrine, nutritional and metabolic disease: Secondary | ICD-10-CM | POA: Insufficient documentation

## 2012-11-17 DIAGNOSIS — Z7982 Long term (current) use of aspirin: Secondary | ICD-10-CM | POA: Insufficient documentation

## 2012-11-17 DIAGNOSIS — I5022 Chronic systolic (congestive) heart failure: Secondary | ICD-10-CM | POA: Insufficient documentation

## 2012-11-17 DIAGNOSIS — I10 Essential (primary) hypertension: Secondary | ICD-10-CM | POA: Insufficient documentation

## 2012-11-17 DIAGNOSIS — Z79899 Other long term (current) drug therapy: Secondary | ICD-10-CM | POA: Insufficient documentation

## 2012-11-17 NOTE — ED Notes (Signed)
Call placed to [employer] regarding pt's pacemaker malfunction, awaiting return call.

## 2012-11-17 NOTE — ED Notes (Signed)
MD attempting to contact representative at [facility] without success, no one in office until 8am.

## 2012-11-17 NOTE — ED Notes (Addendum)
Pt states that her pacemaker was having audible alarms this morning. Denies any CP or SOB. Pt denies any feeling of a shock or jolt with the ringing sounds. Pt states that she believes her device is a [employer] brand but has no ID card with her.

## 2012-11-17 NOTE — ED Provider Notes (Signed)
Care assumed at the change of shift. Pacer interrogated and it had alarmed due to increased impedence on the RV lead. This has since improved. Alarm parameters adjusted. [employer] informed. Pt given home transmission device for mobile phone and cleared for discharge.   Kao Conry B. Bernette Mayers, MD 11/17/12 [phone]

## 2012-11-17 NOTE — ED Provider Notes (Signed)
History     CSN: [id number]  Arrival date & time 11/17/12  0534   None     Chief Complaint  Patient presents with  . Pacemaker Problem    (Consider location/radiation/quality/duration/timing/severity/associated sxs/prior treatment) The history is provided by the patient.  Patient called EMS this am because on 3 occasions she heard what sounded like an alarm which she reports she put her head to her chest.  She came to the ED for evaluation.  She denies CP, DOE or SOB.  No n/v/d.  No fevers no cough.  No swelling  Past Medical History  Diagnosis Date  . GERD (gastroesophageal reflux disease)   . Hx of colonic polyps   . Tubular adenoma 1999    history of  . Nonischemic cardiomyopathy   . Chronic systolic heart failure 12/02/2011  . LBBB (left bundle branch block) 12/02/2011  . Dyslipidemia 12/02/2011  . Hypertension     "used to have this; now my BP is low" (05/10/2012)  . Gout     "very seldom" (05/10/2012)  . Biventricular ICD -Medtronic     DOI 12/13 - Medtronic Viva XR CRT-D defibrillator, serial #[id number] H.    . CHF (congestive heart failure)     Past Surgical History  Procedure Laterality Date  . Cardiac catheterization  12-03-11    selectie coronary angiography  . Cardiac defibrillator placement  05/10/2012  . Insert / replace / remove pacemaker  05/10/2012    CRT-D implantation (05/10/2012)  . Abdominal hysterectomy  1980's    partial    Family History  Problem Relation Age of Onset  . Heart failure Maternal Grandmother     diagnosed in age of 93's. still living at age of 73's.  . Colon cancer      family history  . Angina Mother     diagnosed at age of 50's, unsure whether it was a true diagnosis. unsure about the treatment    History  Substance Use Topics  . Smoking status: Never Smoker   . Smokeless tobacco: Never Used  . Alcohol Use: No    OB History   Grav Para Term Preterm Abortions TAB SAB Ect Mult Living                  Review of  Systems  Constitutional: Negative for fever.  Respiratory: Negative for cough, chest tightness and shortness of breath.   Cardiovascular: Negative for chest pain and palpitations.  All other systems reviewed and are negative.    Allergies  Review of patient's allergies indicates no known allergies.  Home Medications   Current Outpatient Rx  Name  Route  Sig  Dispense  Refill  . aspirin EC 81 MG EC tablet   Oral   Take 1 tablet (81 mg total) by mouth daily.         . carvedilol (COREG) 12.5 MG tablet   Oral   Take 18.75 mg by mouth 2 (two) times daily with a meal. 1 and 1/2 tablets (total 18.75mg  ) two times a day         . furosemide (LASIX) 20 MG tablet   Oral   Take 20 mg by mouth daily as needed. Usually takes on Tues & Thurs         . lidocaine (XYLOCAINE) 2 % solution   Oral   Take 15 mLs by mouth as needed for pain. For sore throat         . lisinopril (PRINIVIL,ZESTRIL)  20 MG tablet   Oral   Take 20 mg by mouth 2 (two) times daily.         . penicillin v potassium (VEETID) 500 MG tablet   Oral   Take 500 mg by mouth every 12 (twelve) hours.         [name] spironolactone (ALDACTONE) 25 MG tablet   Oral   Take 1 tablet (25 mg total) by mouth daily.   30 tablet   11     BP 116/57  Resp 18  SpO2 100%  Physical Exam  Constitutional: She is oriented to person, place, and time. She appears well-developed and well-nourished. No distress.  HENT:  Head: Normocephalic and atraumatic.  Eyes: Conjunctivae are normal. Pupils are equal, round, and reactive to light.  Neck: Normal range of motion. Neck supple.  Cardiovascular: Normal rate, regular rhythm and intact distal pulses.   Pulmonary/Chest: Effort normal and breath sounds normal. She has no wheezes. She has no rales.  Abdominal: Soft. Bowel sounds are normal. There is no tenderness. There is no rebound and no guarding.  Musculoskeletal: Normal range of motion.  Neurological: She is alert and oriented  to person, place, and time.  Skin: Skin is warm and dry.  Psychiatric: She has a normal mood and affect.    ED Course  Procedures (including critical care time)  Labs Reviewed - No data to display No results found.   No diagnosis found.    MDM   Date: 11/17/2012  Rate: 75  Rhythm: normal sinus rhythm, atrial sensed paced rhythm 100%  QRS Axis: normal  Intervals: normal  ST/T Wave abnormalities: normal  Conduction Disutrbances:none  Narrative Interpretation:   Old EKG Reviewed: changes noted     [employer]tronic representative contacted.  Will send a representative to evaluate the device.  Signed out to Dr. Bernette Mayers pending evaluation of device     Curtis Uriarte K Hermine Feria-Rasch, MD 11/17/12 [phone]

## 2012-11-17 NOTE — ED Notes (Signed)
Report received and care assumed.

## 2012-11-17 NOTE — ED Notes (Signed)
Patient transported to X-ray 

## 2012-11-17 NOTE — ED Notes (Signed)
rec'd call back from [place] with [employer] who will be sending a rep out here to assess pt, MD and pt made aware.

## 2012-11-17 NOTE — ED Notes (Signed)
Pt states she has heard her pacemaker ring out 3 times since 2 am, states that it sounds like an alarm clock. Pt denies any CP, no SOB, no diaphoresis.

## 2012-11-17 NOTE — ED Notes (Signed)
MD at bedside. 

## 2012-11-17 NOTE — ED Notes (Signed)
[employer] rep at bedside for pacemaker evaluation

## 2012-11-17 NOTE — ED Notes (Signed)
Awaiting [employer] to assess pacemaker.

## 2012-11-19 ENCOUNTER — Ambulatory Visit (INDEPENDENT_AMBULATORY_CARE_PROVIDER_SITE_OTHER): Payer: BC Managed Care – PPO | Admitting: Physician Assistant

## 2012-11-19 VITALS — BP 108/66 | HR 96 | Temp 98.4°F | Resp 18 | Ht 63.0 in | Wt 203.4 lb

## 2012-11-19 DIAGNOSIS — H9209 Otalgia, unspecified ear: Secondary | ICD-10-CM

## 2012-11-19 DIAGNOSIS — H6981 Other specified disorders of Eustachian tube, right ear: Secondary | ICD-10-CM

## 2012-11-19 DIAGNOSIS — H6980 Other specified disorders of Eustachian tube, unspecified ear: Secondary | ICD-10-CM

## 2012-11-19 DIAGNOSIS — H9201 Otalgia, right ear: Secondary | ICD-10-CM

## 2012-11-19 MED ORDER — IPRATROPIUM BROMIDE 0.03 % NA SOLN: 2.0000 | Freq: Two times a day (BID) | NASAL | Status: DC

## 2012-11-19 NOTE — Progress Notes (Signed)
  Subjective:    Patient ID: Cynthia Roth, female    DOB: 06/07/1950, 62 y.o.   MRN: [id number]  HPI This 62 y.o. female presents for evaluation of RIGHT otalgia.  Completed PCN for strep throat and sinusitis 11/16/2012.    At the time of diagnosis, she had a small cut on her outer ear, and pain down inside the ear, but it was otherwise normal on inspection. She was treated for strep throat and sinusitis. She used H2O2 to clean inside the ear canal and the wounds on her outer ear. Feeling a little bit unbalanced, and queasy.  No ringing, but decreased hearing on the RIGHT, with a "clogged" sensation.  It's important to her to get well in time for her son's wedding on 12/02/2012 in [place].  Sore throat is resolved.  Congestion and drainage mostly resolved.  No fever, chills.  No vomiting.  No B/B changes.  Review of Systems As above.    Objective:   Physical Exam Blood pressure 108/66, pulse 96, temperature 98.4 F (36.9 C), temperature source Oral, resp. rate 18, height 5\' 3"  (1.6 m), weight 203 lb 6.4 oz (92.262 kg), SpO2 99.00%. Body mass index is 36.04 kg/(m^2). Well-developed, well nourished BF who is awake, alert and oriented, in NAD. HEENT: [place]/AT, PERRL, EOMI.  Sclera and conjunctiva are clear.  Outer RIGHT ear with resolving excoriations.  No erythema, edema or tenderness.  No drainage. EAC are patent, TMs are normal in appearance. Nasal mucosa is pink and moist. OP is clear. Neck: supple, non-tender, no lymphadenopathy, thyromegaly. Heart: RRR, no murmur Lungs: normal effort, CTA Extremities: no cyanosis, clubbing or edema. Skin: warm and dry without rash. Psychologic: good mood and appropriate affect, normal speech and behavior.        Assessment & Plan:  Otalgia of right ear/ETD (eustachian tube dysfunction), right - Plan: ipratropium (ATROVENT) 0.03 % nasal spray; counseled to mix H2O2 1:1 with H2O when using it on/in the ear.  Now, however, she can stop using that  mixture and use petroleum jelly or antibiotic ointment to the excoriations.  OTC oral antihistamine as well.  Anticipatory guidance.  RTC if symptoms worsen/persist.  Fernande Bras, PA-C Physician Assistant-Certified [facility]

## 2012-11-19 NOTE — Patient Instructions (Signed)
In addition to the nasal spray, be sure to stay well hydrated, and take an oral antihistamine (like Claritin, Zyrtec or Allegra).

## 2012-11-29 ENCOUNTER — Other Ambulatory Visit (HOSPITAL_COMMUNITY): Payer: Self-pay | Admitting: Physician Assistant

## 2012-12-13 ENCOUNTER — Ambulatory Visit (INDEPENDENT_AMBULATORY_CARE_PROVIDER_SITE_OTHER): Payer: BC Managed Care – PPO | Admitting: *Deleted

## 2012-12-13 ENCOUNTER — Other Ambulatory Visit (HOSPITAL_COMMUNITY): Payer: Self-pay | Admitting: Internal Medicine

## 2012-12-13 ENCOUNTER — Telehealth: Payer: Self-pay | Admitting: Internal Medicine

## 2012-12-13 DIAGNOSIS — I428 Other cardiomyopathies: Secondary | ICD-10-CM

## 2012-12-13 DIAGNOSIS — I5022 Chronic systolic (congestive) heart failure: Secondary | ICD-10-CM

## 2012-12-13 DIAGNOSIS — Z1231 Encounter for screening mammogram for malignant neoplasm of breast: Secondary | ICD-10-CM

## 2012-12-13 LAB — ICD DEVICE OBSERVATION
AL AMPLITUDE: 4 mv
AL IMPEDENCE ICD: 399 Ohm
AL THRESHOLD: 0.5 V
ATRIAL PACING ICD: 0.4 pct
DEV-0020ICD: NEGATIVE
HV IMPEDENCE: 68 Ohm
LV LEAD IMPEDENCE ICD: 513 Ohm
LV LEAD THRESHOLD: 1.25 V
RV LEAD AMPLITUDE: 20 mv
RV LEAD IMPEDENCE ICD: 532 Ohm
RV LEAD THRESHOLD: 0.75 V
TZON-0003SLOWVT: 300 ms
VENTRICULAR PACING ICD: 97.6 pct

## 2012-12-13 NOTE — Telephone Encounter (Signed)
New Prob  Pt states she miss placed her card stating she has a pacemaker and would like a replacement. Please call.

## 2012-12-13 NOTE — Progress Notes (Signed)
ICD check with ICM in office. 

## 2012-12-29 ENCOUNTER — Other Ambulatory Visit: Payer: Self-pay | Admitting: Cardiology

## 2013-01-02 ENCOUNTER — Ambulatory Visit (HOSPITAL_COMMUNITY)
Admission: RE | Admit: 2013-01-02 | Discharge: 2013-01-02 | Disposition: A | Discharge status: Home / Self Care | Payer: BC Managed Care – PPO | Source: Ambulatory Visit | Attending: Internal Medicine | Admitting: Internal Medicine

## 2013-01-02 DIAGNOSIS — Z1231 Encounter for screening mammogram for malignant neoplasm of breast: Secondary | ICD-10-CM | POA: Insufficient documentation

## 2013-01-03 ENCOUNTER — Encounter: Payer: Self-pay | Admitting: Internal Medicine

## 2013-01-04 ENCOUNTER — Ambulatory Visit (INDEPENDENT_AMBULATORY_CARE_PROVIDER_SITE_OTHER): Payer: BC Managed Care – PPO | Admitting: *Deleted

## 2013-01-04 DIAGNOSIS — Z95810 Presence of automatic (implantable) cardiac defibrillator: Secondary | ICD-10-CM

## 2013-01-04 LAB — ICD DEVICE OBSERVATION
DEV-0020ICD: NEGATIVE
TZON-0003SLOWVT: 300 ms

## 2013-01-04 NOTE — Progress Notes (Signed)
OptiVol f/u from 12/13/12 visit.  OptiVol still up since 12/04/12, weight now at 211 from 207lbs on 12/13/12.  Per Dr. Ladona Ridgel, pt instructed to take 40mg  furosemide for three days instead of 20mg  PRN.

## 2013-01-12 ENCOUNTER — Encounter: Payer: Self-pay | Admitting: Internal Medicine

## 2013-01-26 ENCOUNTER — Other Ambulatory Visit: Payer: Self-pay | Admitting: Cardiology

## 2013-01-26 ENCOUNTER — Other Ambulatory Visit: Payer: Self-pay

## 2013-02-01 ENCOUNTER — Other Ambulatory Visit: Payer: Self-pay | Admitting: Cardiology

## 2013-03-12 ENCOUNTER — Telehealth: Payer: Self-pay | Admitting: Cardiology

## 2013-03-12 NOTE — Telephone Encounter (Signed)
LMOVM For Pt.  °

## 2013-03-12 NOTE — Telephone Encounter (Signed)
New Problem:  Pt states she wants to know all the dates and times she was seen in 2013. Pt states she wants to know the amount of clinic and hospital visits for 2013. Please advise

## 2013-03-12 NOTE — Telephone Encounter (Signed)
I will forward to HIM to follow up with patient about the information requested.

## 2013-03-28 ENCOUNTER — Encounter: Payer: Self-pay | Admitting: *Deleted

## 2013-03-29 ENCOUNTER — Ambulatory Visit (INDEPENDENT_AMBULATORY_CARE_PROVIDER_SITE_OTHER): Payer: BC Managed Care – PPO | Admitting: Cardiology

## 2013-03-29 ENCOUNTER — Encounter: Payer: Self-pay | Admitting: Cardiology

## 2013-03-29 ENCOUNTER — Ambulatory Visit (INDEPENDENT_AMBULATORY_CARE_PROVIDER_SITE_OTHER): Payer: BC Managed Care – PPO | Admitting: *Deleted

## 2013-03-29 VITALS — BP 125/70 | HR 80 | Ht 65.0 in | Wt 218.0 lb

## 2013-03-29 DIAGNOSIS — I428 Other cardiomyopathies: Secondary | ICD-10-CM

## 2013-03-29 DIAGNOSIS — I5022 Chronic systolic (congestive) heart failure: Secondary | ICD-10-CM

## 2013-03-29 LAB — ICD DEVICE OBSERVATION
AL AMPLITUDE: 4 mv
AL IMPEDENCE ICD: 399 Ohm
AL THRESHOLD: 0.375 V
ATRIAL PACING ICD: 0.6 pct
DEV-0020ICD: NEGATIVE
HV IMPEDENCE: 70 Ohm
LV LEAD IMPEDENCE ICD: 513 Ohm
LV LEAD THRESHOLD: 1.75 V
RV LEAD AMPLITUDE: 16.5 mv
RV LEAD IMPEDENCE ICD: 532 Ohm
RV LEAD THRESHOLD: 0.625 V
TZON-0003SLOWVT: 300 ms
VENTRICULAR PACING ICD: 96.1 pct

## 2013-03-29 LAB — BASIC METABOLIC PANEL
BUN: 13 mg/dL (ref 6–23)
CO2: 30 mEq/L (ref 19–32)
Calcium: 9.5 mg/dL (ref 8.4–10.5)
Chloride: 103 mEq/L (ref 96–112)
Creatinine, Ser: 0.7 mg/dL (ref 0.4–1.2)
GFR: 102.46 mL/min (ref >60.00)
Glucose, Bld: 106 mg/dL — ABNORMAL HIGH (ref 70–99)
Potassium: 4.1 mEq/L (ref 3.5–5.1)
Sodium: 140 mEq/L (ref 135–145)

## 2013-03-29 LAB — BRAIN NATRIURETIC PEPTIDE: Pro B Natriuretic peptide (BNP): 80 pg/mL (ref 0.0–100.0)

## 2013-03-29 MED ORDER — FUROSEMIDE 20 MG PO TABS: ORAL_TABLET | ORAL | Status: DC

## 2013-03-29 MED ORDER — ISOSORB DINITRATE-HYDRALAZINE 20-37.5 MG PO TABS: ORAL_TABLET | ORAL | Status: DC

## 2013-03-29 NOTE — Progress Notes (Signed)
ICD check in clinic. Normal device function. No episodes recorded. Optivol increase 12-04-12 thru 03-06-13 with slowly trending up at this time. Thoracic impedance shows some improvement. ROV in January with SK.

## 2013-03-29 NOTE — Patient Instructions (Signed)
Take lasix 20mg  every other day.   Start BiDil 20/37.5mg  (1/2 ) tablet three times a day.  Your physician recommends that you have lab work today--BMET/BNP.   Your physician wants you to follow-up in: 6 months with Dr Shirlee Latch. (April 2015). You will receive a reminder letter in the mail two months in advance. If you don't receive a letter, please call our office to schedule the follow-up appointment.

## 2013-03-30 NOTE — Progress Notes (Signed)
Patient ID: Cynthia Roth, female   DOB: 1950-12-11, 62 y.o.   MRN: [id number] PCP: Dr. Alva Garnet  62 yo with history of nonischemic cardiomyopathy presents to the office for followup visit.  Patient was admitted to [facility] in 7/13 with acute CHF.  No prior cardiac history.  No ETOH/Drug history.  No significant family history. No prior viral illness. TSH normal.    She had LBBB on EKG and her echo showed EF 15% with severely dilated LV and diastolic dysfunction. LHC showed no significant coronary disease.  Repeat echo in 10/13 on good medical therapy showed persistent EF 15%.  She had Medtronic CRT-D device placed in 12/13 by Dr. Graciela Husbands.  Followup echo in 3/14 showed persistently depressed LV systolic function with EF 20%.    Overall, she has been doing well.  She is up 7 lbs.  She is not getting much exercise.  She uses Lasix about once a week. She is short of breath walking up steps, no problems on flat ground.  She is working full-time.  No lightheadedness with standing. No chest pain, orthopnea, PND, or syncope.    Optivol interrogation today showed fluid index > threshold for a couple of months in late summer/early fall, now below threshold.    Labs: K 3.7, Creatinine 0.72, BNP 3968 (11/29/11) Labs: K 3.7, Creatinine 0.84, BNP 794, LDL 125, SPEP negative, TSH normal, urine immunofixation negative (12/02/11) Labs: K 4.0, Creatinine 0.8 (12/08/11) Labs: K 3.9, creatinine 0.8 (9/13) Labs: K 3.8, creatinine 0.7 (12/13) Labs: K 3.9, creatinine 0.8 (1/14) Labs: K 4.5, creatinine 1.49 (6/14)  Allergies: NKDA  Past Medical History: 1. CHF and Nonischemic cardiomyopathy: patient was admitted with acute CHF on 11/29/11 and Echo showed EF 15% with diffuse hypokinesis, severely dilated LV, severe diastolic dysfunction. Left heart cath showed no angiographic CAD.  TSH, SPEP, urine immunofixation were all within normal range.  Repeat echo 10/13 with EF 15%, diffuse hypokinesis.  Medtronic CRT-D device  placed 12/13 Graciela Husbands).  Echo (3/14) with EF 20%, diffuse hypokinesis, mild to moderate MR, normal RV.  2. LBBB: noted on hospital admission on 11/29/11. No old EKG. 3. HTN 4. Dyslipidemia 5. Tubular adenoma:  Last colonoscopy in 1999. Followed by GI Dr. Loreta Ave.  Past Surgical History  Procedure Laterality Date  . Cardiac catheterization  12-03-11    selectie coronary angiography  . Cardiac defibrillator placement  61/03/2012  . Insert / replace / remove pacemaker  61/03/2012    CRT-D implantation (61/03/2012)  . Abdominal hysterectomy  1980's    partial   Family History  Problem Relation Age of Onset  . Heart failure Maternal Grandmother     diagnosed in age of 78's. still living at age of 70's.  . Colon cancer      family history  . Angina Mother     diagnosed at age of 70's, unsure whether it was a true diagnosis. unsure about the treatment  . Sleep apnea Brother     History   Social History  . Marital Status: Divorced    Spouse Name: n/a    Number of Children: 1  . Years of Education: Master's   Occupational History  . Program Manager    Social History Main Topics  . Smoking status: Never Smoker   . Smokeless tobacco: Never Used  . Alcohol Use: No  . Drug Use: No  . Sexual Activity: No   Other Topics Concern  . Not on file   Social History Narrative  Lives by herself in [place]. Divorced with one adult child who lives in [place]. She has two brothers living in [place]. 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    Current Outpatient Prescriptions on File Prior to Visit  Medication Sig Dispense Refill  . aspirin 81 MG tablet Take 81 mg by mouth daily.      . carvedilol (COREG) 12.5 MG tablet 1 AND 1/2 TABLETS (TOTAL 18.75MG  ) TWO TIMES A DAY  90 tablet  6  . lisinopril (PRINIVIL,ZESTRIL) 20 MG tablet TAKE 1 TABLET (20 MG TOTAL) BY MOUTH 2 (TWO) TIMES DAILY.  60 tablet  3  . spironolactone (ALDACTONE) 25 MG tablet  TAKE 1 TABLET BY MOUTH EVERY DAY  30 tablet  11   No current facility-administered medications on file prior to visit.   Filed Vitals:   03/29/13 0903  BP: 125/70  Pulse: 80  Height: 5\' 5"  (1.651 m)  Weight: 98.884 kg (218 lb)   General: NAD Neck: JVP 8 cm, no thyromegaly or thyroid nodule.  Lungs: Clear to auscultation bilaterally with normal respiratory effort. CV: Nondisplaced PMI.  Heart regular S1/S2, no S3/S4, 2/6 early SEM.  No peripheral edema.  No carotid bruit.  Normal pedal pulses.  Abdomen: Soft, nontender, no hepatosplenomegaly, no distention.  Neurologic: Alert and oriented x 3.  Psych: Normal affect. Extremities: No clubbing or cyanosis.  Assessment/Plan:  Systolic heart failure  Patient has a nonischemic CMP of uncertain etiology. TSH and serum/urine immunofixations were normal. Possible etiologies include viral myocarditis or LBBB cardiomyopathy. She is doing well with NYHA class II symptoms. She is probably mildly volume overloaded, a few weeks ago Optivol interrogation suggests that she was more volume overloaded.  She has a CRT-D device. - Continue lisinopril, Coreg, and spironolactone at current doses.  - Add Bidil 1/2 tab tid. - BMET/BNP today. - I want her to take Lasix 20 mg daily every day rather than prn.   Followup in 3 months.   Marca Ancona 03/30/2013 3:11 PM

## 2013-04-03 NOTE — Telephone Encounter (Signed)
Follow up   Test results

## 2013-04-03 NOTE — Telephone Encounter (Signed)
Spoke with patient.

## 2013-04-09 ENCOUNTER — Encounter: Payer: Self-pay | Admitting: Internal Medicine

## 2013-05-27 ENCOUNTER — Other Ambulatory Visit: Payer: Self-pay | Admitting: Cardiology

## 2013-06-27 ENCOUNTER — Encounter: Payer: Self-pay | Admitting: Internal Medicine

## 2013-06-27 ENCOUNTER — Ambulatory Visit (INDEPENDENT_AMBULATORY_CARE_PROVIDER_SITE_OTHER): Payer: BC Managed Care – PPO | Admitting: Internal Medicine

## 2013-06-27 VITALS — BP 92/59 | HR 72 | Ht 65.0 in | Wt 222.0 lb

## 2013-06-27 DIAGNOSIS — I428 Other cardiomyopathies: Secondary | ICD-10-CM

## 2013-06-27 DIAGNOSIS — Z95810 Presence of automatic (implantable) cardiac defibrillator: Secondary | ICD-10-CM

## 2013-06-27 DIAGNOSIS — I5022 Chronic systolic (congestive) heart failure: Secondary | ICD-10-CM

## 2013-06-27 LAB — MDC_IDC_ENUM_SESS_TYPE_INCLINIC
Battery Remaining Longevity: 66 mo
Battery Voltage: 2.98 V
Brady Statistic AP VP Percent: 0.22 %
Brady Statistic AP VS Percent: 0.04 %
Brady Statistic AS VP Percent: 97.85 %
Brady Statistic AS VS Percent: 1.89 %
Brady Statistic RA Percent Paced: 0.26 %
Brady Statistic RV Percent Paced: 5.94 %
Date Time Interrogation Session: 20150128154814
HighPow Impedance: 57 Ohm
HighPow Impedance: 72 Ohm
Lead Channel Impedance Value: 361 Ohm
Lead Channel Impedance Value: 513 Ohm
Lead Channel Impedance Value: 551 Ohm
Lead Channel Impedance Value: 589 Ohm
Lead Channel Impedance Value: 950 Ohm
Lead Channel Pacing Threshold Amplitude: 0.375 V
Lead Channel Pacing Threshold Amplitude: 0.5 V
Lead Channel Pacing Threshold Amplitude: 3 V
Lead Channel Pacing Threshold Pulse Width: 0.4 ms
Lead Channel Pacing Threshold Pulse Width: 0.4 ms
Lead Channel Pacing Threshold Pulse Width: 0.8 ms
Lead Channel Sensing Intrinsic Amplitude: 19.375 mV
Lead Channel Sensing Intrinsic Amplitude: 4 mV
Lead Channel Sensing Intrinsic Amplitude: 4.75 mV
Lead Channel Setting Pacing Amplitude: 2 V
Lead Channel Setting Pacing Amplitude: 2.5 V
Lead Channel Setting Pacing Amplitude: 3 V
Lead Channel Setting Pacing Pulse Width: 0.4 ms
Lead Channel Setting Pacing Pulse Width: 0.8 ms
Lead Channel Setting Sensing Sensitivity: 0.3 mV
Zone Setting Detection Interval: 250 ms
Zone Setting Detection Interval: 300 ms
Zone Setting Detection Interval: 350 ms
Zone Setting Detection Interval: 450 ms

## 2013-06-27 NOTE — Assessment & Plan Note (Signed)
Euvolemic continue current medications; potassium level is okay in October   she is not having significant lightheadedness not withstanding her modestly low blood pressure

## 2013-06-27 NOTE — Assessment & Plan Note (Signed)
The patient's device was interrogated.  The information was reviewed. No changes were made in the programming.    

## 2013-06-27 NOTE — Progress Notes (Signed)
      Patient Care Team: Royetta Crochet. Karlton Lemon, MD as PCP - General (Internal Medicine)   HPI  Cynthia Roth is a 63 y.o. female Seen in followup for congestive heart failure with CRT-D implantation for nonischemic cardiomyopathy 12/13  She is considerably improved with improved exercise tolerance; no edema or lightheadedness  K ok 10/14     Past Medical History  Diagnosis Date  . GERD (gastroesophageal reflux disease)   . Hx of colonic polyps   . Tubular adenoma 1999    history of  . Nonischemic cardiomyopathy   . Chronic systolic heart failure 06/07/5629  . LBBB (left bundle branch block) 12/02/2011  . Dyslipidemia 12/02/2011  . Hypertension     "used to have this; now my BP is low" (05/10/2012)  . Gout     "very seldom" (05/10/2012)  . Biventricular ICD -Medtronic     DOI 12/13 - Medtronic Viva XR CRT-D defibrillator, serial #[id number] H.    . CHF (congestive heart failure)   . Allergy     Past Surgical History  Procedure Laterality Date  . Cardiac catheterization  12-03-11    selectie coronary angiography  . Cardiac defibrillator placement  05/10/2012  . Insert / replace / remove pacemaker  05/10/2012    CRT-D implantation (05/10/2012)  . Abdominal hysterectomy  1980's    partial    Current Outpatient Prescriptions  Medication Sig Dispense Refill  . aspirin 81 MG tablet Take 81 mg by mouth daily.      . carvedilol (COREG) 12.5 MG tablet 1 AND 1/2 TABLETS (TOTAL 18.75MG  ) TWO TIMES A DAY  90 tablet  6  . furosemide (LASIX) 20 MG tablet 1 tablet every other day  15 tablet  6  . isosorbide-hydrALAZINE (BIDIL) 20-37.5 MG per tablet 1/2 tablet three times a day  45 tablet  6  . lisinopril (PRINIVIL,ZESTRIL) 20 MG tablet TAKE 1 TABLET (20 MG TOTAL) BY MOUTH 2 (TWO) TIMES DAILY.  60 tablet  3  . spironolactone (ALDACTONE) 25 MG tablet TAKE 1 TABLET BY MOUTH EVERY DAY  30 tablet  11   No current facility-administered medications for this visit.    No Known  Allergies  Review of Systems negative except from HPI and PMH  Physical Exam BP 92/59  Pulse 72  Ht 5\' 5"  (1.651 m)  Wt 222 lb (100.699 kg)  BMI 36.94 kg/m2 Well developed and nourished in no acute distress HENT normal Neck supple with JVP-flat Clear Regular rate and rhythm, no murmurs or gallops Abd-soft with active BS No Clubbing cyanosis edema Skin-warm and dry A & Oriented  Grossly normal sensory and motor function Device pocket well healed; without hematoma or erythema.  There is no tethering   ECG demonstrates sinus rhythm he synchronous pacing with a left bundle branch block configuration and a QRS duration of 148  Assessment and  Plan

## 2013-06-27 NOTE — Patient Instructions (Signed)
Your physician recommends that you continue on your current medications as directed. Please refer to the Current Medication list given to you today.  Your physician recommends that you schedule a follow-up appointment in: March with Dr. [name]  Your physician recommends that you schedule a follow-up appointment in: 3 months with device clinic  Your physician wants you to follow-up in: 1 year with Dr. Caryl Comes.  You will receive a reminder letter in the mail two months in advance. If you don't receive a letter, please call our office to schedule the follow-up appointment.

## 2013-08-05 ENCOUNTER — Other Ambulatory Visit: Payer: Self-pay | Admitting: Cardiology

## 2013-08-07 ENCOUNTER — Other Ambulatory Visit: Payer: Self-pay | Admitting: Cardiology

## 2013-08-10 ENCOUNTER — Encounter: Payer: Self-pay | Admitting: *Deleted

## 2013-08-20 ENCOUNTER — Encounter: Payer: Self-pay | Admitting: Cardiology

## 2013-08-20 ENCOUNTER — Ambulatory Visit (INDEPENDENT_AMBULATORY_CARE_PROVIDER_SITE_OTHER): Payer: BC Managed Care – PPO | Admitting: Cardiology

## 2013-08-20 VITALS — BP 112/62 | HR 80 | Ht 65.0 in | Wt 223.0 lb

## 2013-08-20 DIAGNOSIS — I447 Left bundle-branch block, unspecified: Secondary | ICD-10-CM

## 2013-08-20 DIAGNOSIS — I5022 Chronic systolic (congestive) heart failure: Secondary | ICD-10-CM

## 2013-08-20 DIAGNOSIS — I428 Other cardiomyopathies: Secondary | ICD-10-CM

## 2013-08-20 MED ORDER — ISOSORB DINITRATE-HYDRALAZINE 20-37.5 MG PO TABS: 1.0000 | ORAL_TABLET | Freq: Three times a day (TID) | ORAL | Status: DC

## 2013-08-20 NOTE — Patient Instructions (Signed)
Increase BiDil to 1 tablet three times a day.   Your physician recommends that you have lab work today--BMET/BNP.  Your physician has recommended that you have a cardiopulmonary stress test (CPX). CPX testing is a non-invasive measurement of heart and lung function. It replaces a traditional treadmill stress test. This type of test provides a tremendous amount of information that relates not only to your present condition but also for future outcomes. This test combines measurements of you ventilation, respiratory gas exchange in the lungs, electrocardiogram (EKG), blood pressure and physical response before, during, and following an exercise protocol.  Your physician recommends that you return for lab work in: 3 months--BMET.   Your physician recommends that you schedule a follow-up appointment in: 4 months with Dr [name] in the [facility] at [facility].

## 2013-08-21 LAB — BRAIN NATRIURETIC PEPTIDE: Pro B Natriuretic peptide (BNP): 42 pg/mL (ref 0.0–100.0)

## 2013-08-21 LAB — BASIC METABOLIC PANEL
BUN: 17 mg/dL (ref 6–23)
CO2: 29 mEq/L (ref 19–32)
Calcium: 9.6 mg/dL (ref 8.4–10.5)
Chloride: 104 mEq/L (ref 96–112)
Creatinine, Ser: 0.8 mg/dL (ref 0.4–1.2)
GFR: 88.4 mL/min (ref >60.00)
Glucose, Bld: 93 mg/dL (ref 70–99)
Potassium: 4 mEq/L (ref 3.5–5.1)
Sodium: 139 mEq/L (ref 135–145)

## 2013-08-22 NOTE — Progress Notes (Signed)
Patient ID: Cynthia Roth, female   DOB: 1951/01/16, 63 y.o.   MRN: [id number] PCP: Karlton Lemon  63 yo with history of nonischemic cardiomyopathy presents to the office for followup visit.  Patient was admitted to [facility] in 7/13 with acute CHF.  No prior cardiac history.  No ETOH/Drug history.  No significant family history. No prior viral illness. TSH normal.    She had LBBB on EKG and her echo showed EF 15% with severely dilated LV and diastolic dysfunction. LHC showed no significant coronary disease.  Repeat echo in 10/13 on good medical therapy showed persistent EF 15%.  She had Medtronic CRT-D device placed in 12/13 by Dr. Caryl Comes.  Followup echo in 3/14 showed persistently depressed LV systolic function with EF 20%.    Overall, she has been doing well.  Weight is stable.  She takes Lasix every other day. She is short of breath walking up steps, no problems on flat ground.  She is working full-time.  No lightheadedness with standing. No chest pain, orthopnea, PND, or syncope.    Labs: K 3.7, Creatinine 0.72, BNP 3968 (11/29/11) Labs: K 3.7, Creatinine 0.84, BNP 794, LDL 125, SPEP negative, TSH normal, urine immunofixation negative (12/02/11) Labs: K 4.0, Creatinine 0.8 (12/08/11) Labs: K 3.9, creatinine 0.8 (9/13) Labs: K 3.8, creatinine 0.7 (12/13) Labs: K 3.9, creatinine 0.8 (1/14) Labs: K 4.5, creatinine 1.49 (6/14) Labs: K 4.1, creatinine 0.7, BNP 80 (10/14)  Allergies: NKDA  Past Medical History: 1. CHF and Nonischemic cardiomyopathy: patient was admitted with acute CHF on 11/29/11 and Echo showed EF 15% with diffuse hypokinesis, severely dilated LV, severe diastolic dysfunction. Left heart cath showed no angiographic CAD.  TSH, SPEP, urine immunofixation were all within normal range.  Repeat echo 10/13 with EF 15%, diffuse hypokinesis.  Medtronic CRT-D device placed 12/13 Caryl Comes).  Echo (3/14) with EF 20%, diffuse hypokinesis, mild to moderate MR, normal RV.  2. LBBB: noted on hospital admission  on 11/29/11. No old EKG. 3. HTN 4. Dyslipidemia 5. Tubular adenoma:  Last colonoscopy in 1999. Followed by GI Dr. Collene Mares.  Family History  Problem Relation Age of Onset  . Heart failure Maternal Grandmother     diagnosed in age of 9's. still living at age of 78's.  . Colon cancer      family history  . Angina Mother     diagnosed at age of 76's, unsure whether it was a true diagnosis. unsure about the treatment  . Sleep apnea Brother     History   Social History  . Marital Status: Divorced    Spouse Name: n/a    Number of Children: 1  . Years of Education: Master's   Occupational History  . Program Manager    Social History Main Topics  . Smoking status: Never Smoker   . Smokeless tobacco: Never Used  . Alcohol Use: No  . Drug Use: No  . Sexual Activity: No   Other Topics Concern  . Not on file   Social History Narrative   Lives by herself in [place]. Divorced with one adult child who lives in [place]. She has two brothers living in [place]. 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 She has [employer].    Current Outpatient Prescriptions on File Prior to Visit  Medication Sig Dispense Refill  . aspirin 81 MG tablet Take 81 mg by mouth daily.      . carvedilol (COREG) 12.5 MG tablet TAKE 1 AND 1/2 TABLET  BY MOUTH TWICE A DAY  90 tablet  1  . furosemide (LASIX) 20 MG tablet 1 tablet every other day  15 tablet  6  . lisinopril (PRINIVIL,ZESTRIL) 20 MG tablet TAKE 1 TABLET (20 MG TOTAL) BY MOUTH 2 (TWO) TIMES DAILY.  60 tablet  3  . spironolactone (ALDACTONE) 25 MG tablet TAKE 1 TABLET BY MOUTH EVERY DAY  30 tablet  11   No current facility-administered medications on file prior to visit.   Filed Vitals:   08/20/13 1623  BP: 112/62  Pulse: 80  Height: 5\' 5"  (1.651 m)  Weight: 101.152 kg (223 lb)   General: NAD Neck: JVP 7 cm, no thyromegaly or thyroid nodule.  Lungs: Clear to auscultation bilaterally with normal respiratory  effort. CV: Nondisplaced PMI.  Heart regular S1/S2, no S3/S4, 1/6 early SEM.  No peripheral edema.  No carotid bruit.  Normal pedal pulses.  Abdomen: Soft, nontender, no hepatosplenomegaly, no distention.  Neurologic: Alert and oriented x 3.  Psych: Normal affect. Extremities: No clubbing or cyanosis.  Assessment/Plan:  Systolic heart failure  Patient has a nonischemic CMP of uncertain etiology.  Possible causes include viral myocarditis or LBBB cardiomyopathy. She is doing well with NYHA class II symptoms. She is not volume overloaded. She has a CRT-D device.  EF has remained persistently low. - Continue lisinopril, Coreg, Lasix, and spironolactone at current doses.  - Increase Bidil to 1 tab tid. - BMET/BNP today. - I will set her up for CPX to get objective assessment of functional capacity.    Loralie Champagne 08/22/2013 6:43 AM

## 2013-08-28 ENCOUNTER — Ambulatory Visit (HOSPITAL_COMMUNITY): Payer: BC Managed Care – PPO | Attending: Cardiology

## 2013-08-28 DIAGNOSIS — I5022 Chronic systolic (congestive) heart failure: Secondary | ICD-10-CM | POA: Insufficient documentation

## 2013-08-28 DIAGNOSIS — I428 Other cardiomyopathies: Secondary | ICD-10-CM

## 2013-09-20 ENCOUNTER — Encounter (HOSPITAL_COMMUNITY): Payer: Self-pay

## 2013-09-20 ENCOUNTER — Ambulatory Visit (HOSPITAL_COMMUNITY)
Admission: RE | Admit: 2013-09-20 | Discharge: 2013-09-20 | Disposition: A | Discharge status: Home / Self Care | Payer: BC Managed Care – PPO | Source: Ambulatory Visit | Attending: Cardiology | Admitting: Cardiology

## 2013-09-20 VITALS — BP 96/44 | HR 92 | Ht 64.5 in | Wt 224.0 lb

## 2013-09-20 DIAGNOSIS — I5022 Chronic systolic (congestive) heart failure: Secondary | ICD-10-CM | POA: Insufficient documentation

## 2013-09-20 NOTE — Patient Instructions (Signed)
Your physician recommends that you schedule a follow-up appointment in: 3 months  Do the following things EVERYDAY: 1) Weigh yourself in the morning before breakfast. Write it down and keep it in a log. 2) Take your medicines as prescribed 3) Eat low salt foods-Limit salt (sodium) to 2000 mg per day.  4) Stay as active as you can everyday 5) Limit all fluids for the day to less than 2 liters 6)   

## 2013-09-21 NOTE — Progress Notes (Signed)
Patient ID: Cynthia Roth, female   DOB: [birth year], 63 y.o.   MRN: [id number] PCP: Karlton Lemon  63 yo with history of nonischemic cardiomyopathy presents to the office for followup visit.  Patient was admitted to [facility] in 7/13 with acute CHF.  No prior cardiac history.  No ETOH/Drug history.  No significant family history. No prior viral illness. TSH normal.    She had LBBB on EKG and her echo showed EF 15% with severely dilated LV and diastolic dysfunction. LHC showed no significant coronary disease.  Repeat echo in 10/13 on good medical therapy showed persistent EF 15%.  She had Medtronic CRT-D device placed in 12/13 by Dr. Caryl Comes.  Followup echo in 3/14 showed persistently depressed LV systolic function with EF 20%.  I had her do a CPX in 3/15.  This showed mildly decreased functional capacity, suspect obesity played significant role in this.   Overall, she has been doing well.  Weight is stable.  She takes Lasix every other day. She is short of breath walking up steps, no problems on flat ground.  She is working full-time.  Not getting much exercise because cares for her mother and grandmother.  Occasional mild  lightheadedness with standing. No chest pain, orthopnea, PND, or syncope.    Labs: K 3.7, Creatinine 0.72, BNP 3968 (11/29/11) Labs: K 3.7, Creatinine 0.84, BNP 794, LDL 125, SPEP negative, TSH normal, urine immunofixation negative (12/02/11) Labs: K 4.0, Creatinine 0.8 (12/08/11) Labs: K 3.9, creatinine 0.8 (9/13) Labs: K 3.8, creatinine 0.7 (12/13) Labs: K 3.9, creatinine 0.8 (1/14) Labs: K 4.5, creatinine 1.49 (6/14) Labs: K 4.1, creatinine 0.7, BNP 80 (10/14) Labs: K 4, creatinine 0.8, BNP 42 (3/15)  Allergies: NKDA  Past Medical History: 1. CHF and Nonischemic cardiomyopathy: patient was admitted with acute CHF on 11/29/11 and Echo showed EF 15% with diffuse hypokinesis, severely dilated LV, severe diastolic dysfunction. Left heart cath showed no angiographic CAD.  TSH, SPEP, urine  immunofixation were all within normal range.  Repeat echo 10/13 with EF 15%, diffuse hypokinesis.  Medtronic CRT-D device placed 12/13 Caryl Comes).  Echo (3/14) with EF 20%, diffuse hypokinesis, mild to moderate MR, normal RV. CPX (3/15) with peak VO2 13.4 adjusted to 21 for ideal body weight, VE/VCO2 slope 31, RER 1.09, mildly decreased functional capacity, suspect obesity plays a role.  2. LBBB: noted on hospital admission on 11/29/11. No old EKG. 3. HTN 4. Dyslipidemia 5. Tubular adenoma:  Last colonoscopy in 1999. Followed by GI Dr. Collene Mares.  Family History  Problem Relation Age of Onset  . Heart failure Maternal Grandmother     diagnosed in age of 74's. still living at age of 36's.  . Colon cancer      family history  . Angina Mother     diagnosed at age of 4's, unsure whether it was a true diagnosis. unsure about the treatment  . Sleep apnea Brother     History   Social History  . Marital Status: Divorced    Spouse Name: n/a    Number of Children: 1  . Years of Education: Master's   Occupational History  . Program Manager    Social History Main Topics  . Smoking status: Never Smoker   . Smokeless tobacco: Never Used  . Alcohol Use: No  . Drug Use: No  . Sexual Activity: No   Other Topics Concern  . Not on file   Social History Narrative   Lives by herself in [place]. Divorced with one adult  child who lives in [place]. She has two brothers living in [place]. 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 She has [employer].    Current Outpatient Prescriptions on File Prior to Encounter  Medication Sig Dispense Refill  . aspirin 81 MG tablet Take 81 mg by mouth daily.      . carvedilol (COREG) 12.5 MG tablet TAKE 1 AND 1/2 TABLET BY MOUTH TWICE A DAY  90 tablet  1  . CINNAMON PO Take by mouth daily.      . furosemide (LASIX) 20 MG tablet 1 tablet every other day  15 tablet  6  . isosorbide-hydrALAZINE (BIDIL) 20-37.5 MG per tablet Take 1 tablet  by mouth 3 (three) times daily.  90 tablet  5  . lisinopril (PRINIVIL,ZESTRIL) 20 MG tablet TAKE 1 TABLET (20 MG TOTAL) BY MOUTH 2 (TWO) TIMES DAILY.  60 tablet  3  . Multiple Vitamins-Minerals (CENTRUM SILVER PO) Take by mouth daily.      [name] spironolactone (ALDACTONE) 25 MG tablet TAKE 1 TABLET BY MOUTH EVERY DAY  30 tablet  11   No current facility-administered medications on file prior to encounter.   Filed Vitals:   09/20/13 1152  BP: 96/44  Pulse: 92  Height: 5' 4.5" (1.638 m)  Weight: 224 lb (101.606 kg)  SpO2: 100%   General: NAD Neck: JVP 7 cm, no thyromegaly or thyroid nodule.  Lungs: Clear to auscultation bilaterally with normal respiratory effort. CV: Nondisplaced PMI.  Heart regular S1/S2, no S3/S4, 1/6 early SEM.  No peripheral edema.  No carotid bruit.  Normal pedal pulses.  Abdomen: Soft, nontender, no hepatosplenomegaly, no distention.  Neurologic: Alert and oriented x 3.  Psych: Normal affect. Extremities: No clubbing or cyanosis.  Assessment/Plan:  Systolic heart failure  Patient has a nonischemic CMP of uncertain etiology.  Possible causes include viral myocarditis or LBBB cardiomyopathy. She is doing well with stable NYHA class II symptoms. She is not volume overloaded. She has a Medtronic CRT-D device.  EF has remained persistently low.  CPX recently showed mildly decreased functional capacity with obesity likely playing a significant role.   - Continue lisinopril, Coreg, Lasix, Bidil, and spironolactone at current doses.  I will not increase meds today due to low BP and occasional mild orthostatic symptoms.  - Followup in 3 months with [facility]. - I talked to here about starting an exercise program and about dieting for weight loss.     Larey Dresser 09/21/2013 6:30 AM

## 2013-09-26 ENCOUNTER — Ambulatory Visit (INDEPENDENT_AMBULATORY_CARE_PROVIDER_SITE_OTHER): Payer: BC Managed Care – PPO | Admitting: *Deleted

## 2013-09-26 DIAGNOSIS — I5022 Chronic systolic (congestive) heart failure: Secondary | ICD-10-CM

## 2013-09-26 DIAGNOSIS — I428 Other cardiomyopathies: Secondary | ICD-10-CM

## 2013-09-26 DIAGNOSIS — I447 Left bundle-branch block, unspecified: Secondary | ICD-10-CM

## 2013-09-27 LAB — MDC_IDC_ENUM_SESS_TYPE_INCLINIC
Battery Remaining Longevity: 5.4
Brady Statistic AP VP Percent: 0.7 %
Brady Statistic AP VS Percent: 0.1 % — CL
Brady Statistic AS VP Percent: 97.2 %
Brady Statistic AS VS Percent: 2.1 %
HighPow Impedance: 71 Ohm
Lead Channel Impedance Value: 361 Ohm
Lead Channel Impedance Value: 513 Ohm
Lead Channel Impedance Value: 589 Ohm
Lead Channel Pacing Threshold Amplitude: 0.5 V
Lead Channel Pacing Threshold Amplitude: 0.75 V
Lead Channel Pacing Threshold Amplitude: 2.5 V
Lead Channel Pacing Threshold Pulse Width: 0.4 ms
Lead Channel Pacing Threshold Pulse Width: 0.4 ms
Lead Channel Pacing Threshold Pulse Width: 1 ms
Lead Channel Sensing Intrinsic Amplitude: 16.9 mV
Lead Channel Sensing Intrinsic Amplitude: 4 mV
Lead Channel Setting Pacing Amplitude: 2 V
Lead Channel Setting Pacing Amplitude: 2.5 V
Lead Channel Setting Pacing Amplitude: 3 V
Lead Channel Setting Pacing Pulse Width: 0.4 ms
Lead Channel Setting Pacing Pulse Width: 0.8 ms
Lead Channel Setting Sensing Sensitivity: 0.3 mV
Zone Setting Detection Interval: 250 ms
Zone Setting Detection Interval: 300 ms
Zone Setting Detection Interval: 350 ms
Zone Setting Detection Interval: 450 ms

## 2013-09-27 NOTE — Progress Notes (Signed)
CRT-D device check in office. Thresholds and sensing consistent with previous device measurements. Lead impedance trends stable over time. No mode switch episodes recorded. No ventricular arrhythmia episodes recorded. 18 VS episodes---max dur. 3 mins, max A 162, Max V 162---AT. Patient bi-ventricularly pacing 96.9% of the time with 2% VSRp. Device programmed with appropriate safety margins. Heart failure diagnostics reviewed and trends are stable for patient---last abn June 2014-Sept 2014. Audible alerts demonstrated for patient. No changes made this session. Estimated longevity 5.4 years.  Patient enrolled in remote follow up. Plan to follow up with the device clinic in 3 months and with [facility] in January 2016.

## 2013-10-04 ENCOUNTER — Other Ambulatory Visit: Payer: Self-pay | Admitting: Cardiology

## 2013-10-16 ENCOUNTER — Encounter: Payer: Self-pay | Admitting: Internal Medicine

## 2013-11-20 ENCOUNTER — Other Ambulatory Visit (INDEPENDENT_AMBULATORY_CARE_PROVIDER_SITE_OTHER): Payer: BC Managed Care – PPO

## 2013-11-20 DIAGNOSIS — I428 Other cardiomyopathies: Secondary | ICD-10-CM

## 2013-11-20 DIAGNOSIS — I5022 Chronic systolic (congestive) heart failure: Secondary | ICD-10-CM

## 2013-11-20 LAB — BASIC METABOLIC PANEL
BUN: 29 mg/dL — ABNORMAL HIGH (ref 6–23)
CO2: 30 mEq/L (ref 19–32)
Calcium: 9 mg/dL (ref 8.4–10.5)
Chloride: 107 mEq/L (ref 96–112)
Creatinine, Ser: 1.4 mg/dL — ABNORMAL HIGH (ref 0.4–1.2)
GFR: 51.09 mL/min — ABNORMAL LOW (ref >60.00)
Glucose, Bld: 95 mg/dL (ref 70–99)
Potassium: 4 mEq/L (ref 3.5–5.1)
Sodium: 139 mEq/L (ref 135–145)

## 2013-11-26 ENCOUNTER — Telehealth (HOSPITAL_COMMUNITY): Payer: Self-pay | Admitting: *Deleted

## 2013-11-26 MED ORDER — FUROSEMIDE 20 MG PO TABS: 20.0000 mg | ORAL_TABLET | ORAL | Status: DC | PRN

## 2013-11-26 NOTE — Telephone Encounter (Signed)
Message copied by Scarlette Calico on Mon Nov 26, 2013  3:39 PM ------      Message from: Larey Dresser      Created: Tue Nov 20, 2013  6:16 PM       Creatinine is higher.  Would have her try stopping Lasix.  Call with weight gain more than 3 lbs or increased dyspnea. ------

## 2013-11-26 NOTE — Telephone Encounter (Signed)
Pt aware.

## 2013-12-02 ENCOUNTER — Other Ambulatory Visit: Payer: Self-pay | Admitting: Cardiology

## 2013-12-08 ENCOUNTER — Other Ambulatory Visit (HOSPITAL_COMMUNITY): Payer: Self-pay | Admitting: Cardiology

## 2013-12-20 IMAGING — CR DG CHEST 2V
2 series · 2 of 2 positions shown · non-contrast
Comparison: None.

CLINICAL DATA: Short of breath.  Tachycardia.

CHEST - 2 VIEW

[PA]
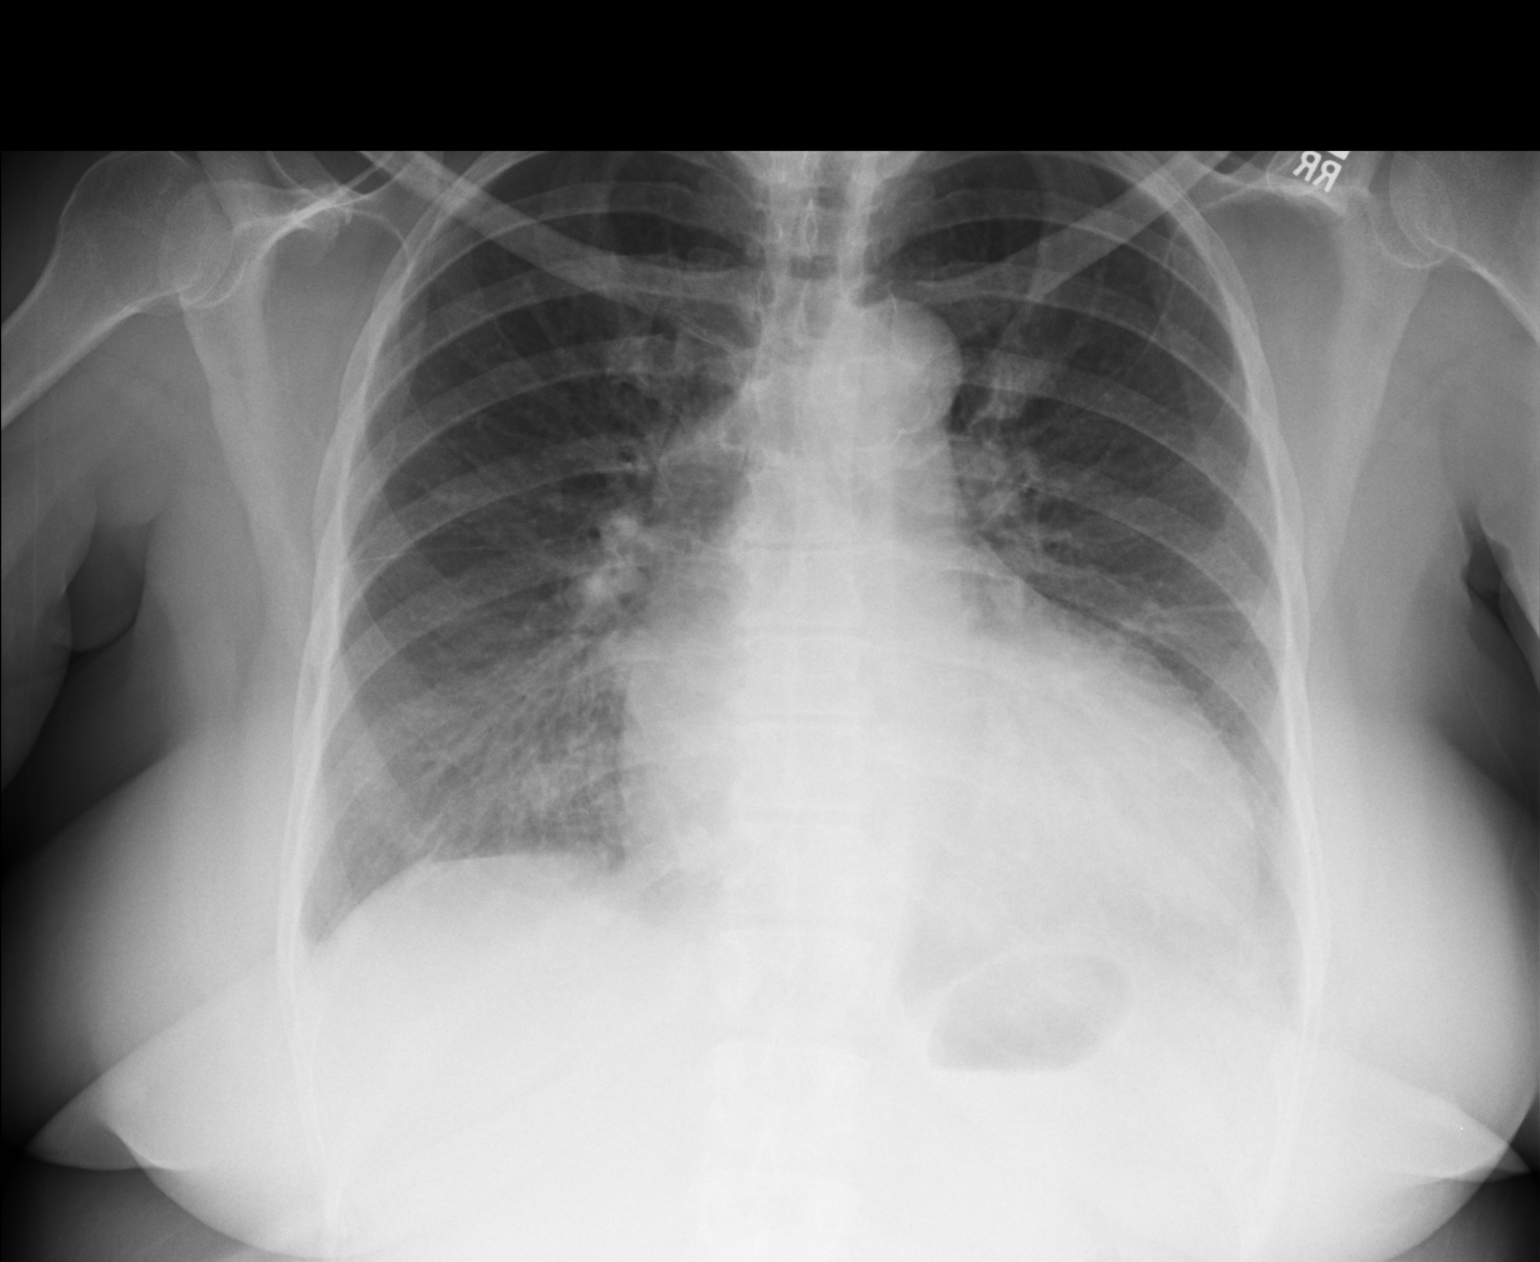

[lateral]
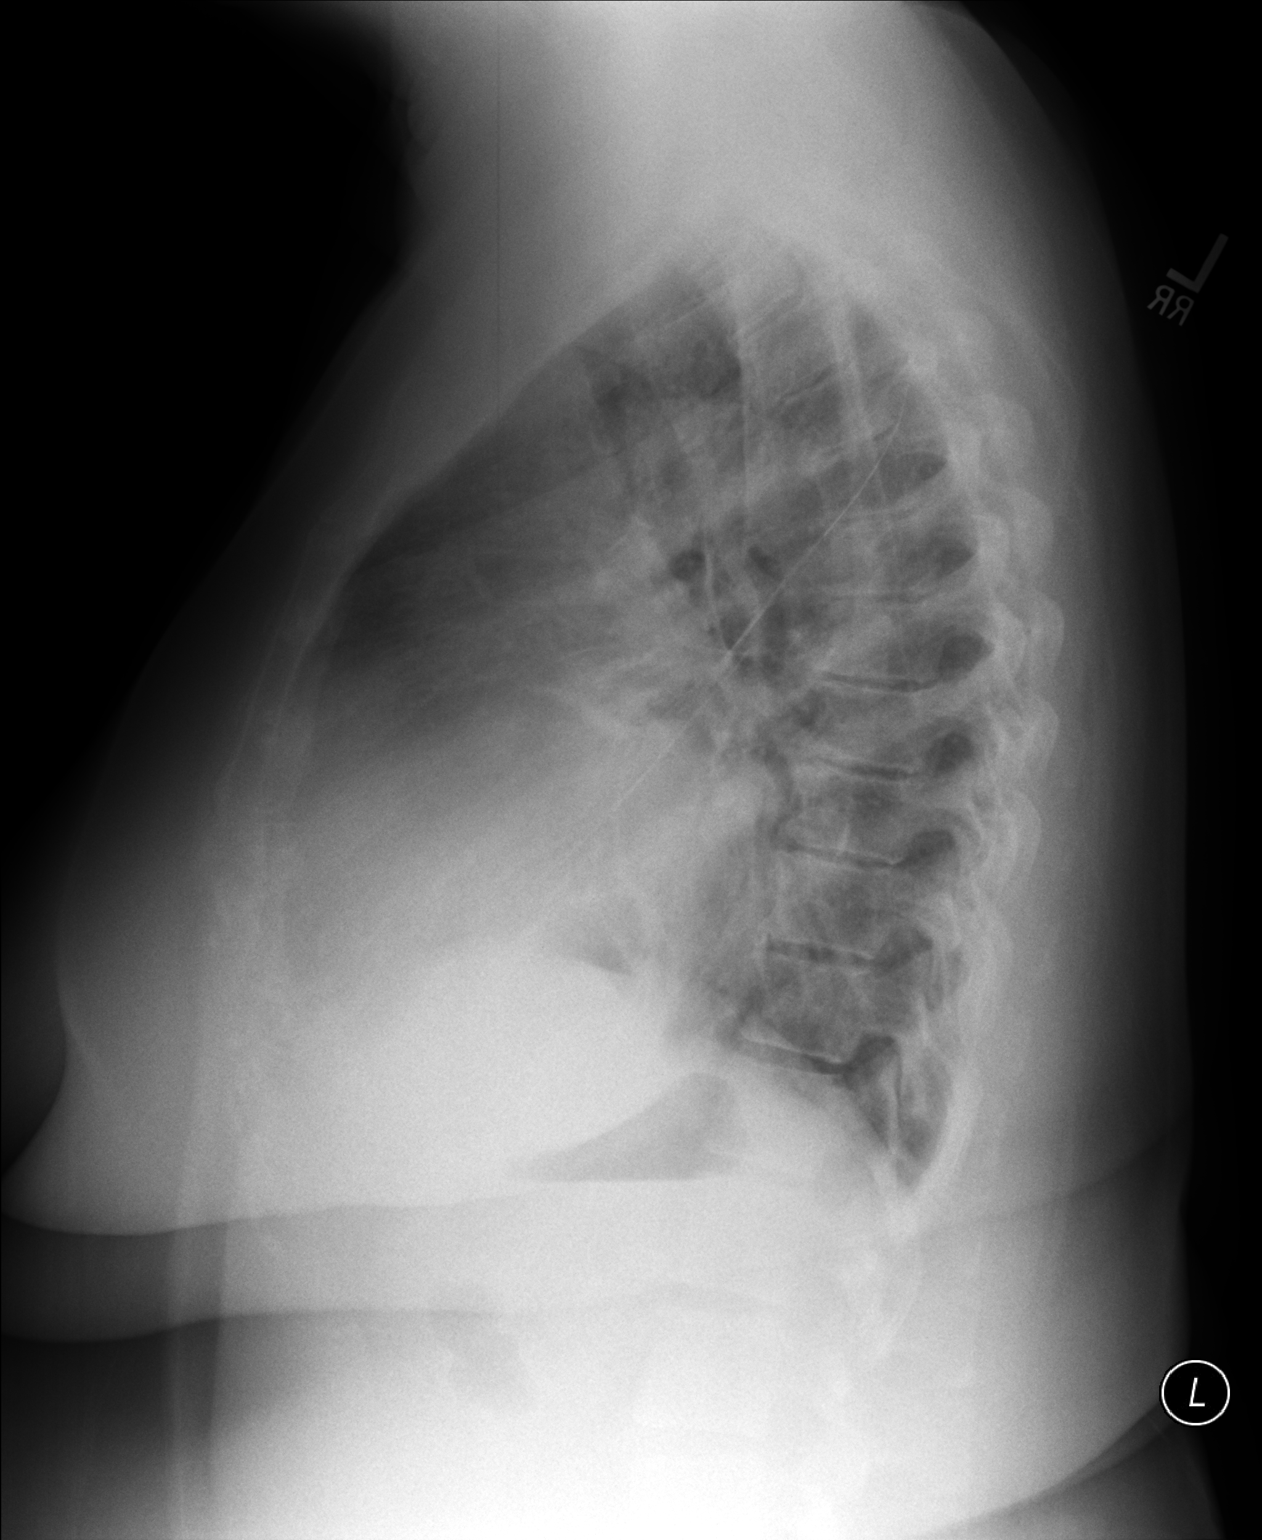

[2 of 2 positions shown; findings below may reference images not displayed]

FINDINGS: Cardiomegaly.  Interstitial and mild basilar alveolar
pulmonary edema is present.  Small right pleural effusion is
present.  No gross consolidation.  Bilateral basilar atelectasis.
Mediastinal contours appear within normal limits.
IMPRESSION: Mild to moderate CHF.

Clinically significant discrepancy from primary report, if
provided: None

## 2014-01-22 ENCOUNTER — Other Ambulatory Visit (HOSPITAL_COMMUNITY): Payer: Self-pay

## 2014-01-22 MED ORDER — FUROSEMIDE 20 MG PO TABS: 20.0000 mg | ORAL_TABLET | ORAL | Status: DC | PRN

## 2014-01-24 ENCOUNTER — Other Ambulatory Visit (HOSPITAL_COMMUNITY): Payer: Self-pay

## 2014-01-29 ENCOUNTER — Other Ambulatory Visit: Payer: Self-pay | Admitting: Cardiology

## 2014-04-07 ENCOUNTER — Other Ambulatory Visit: Payer: Self-pay | Admitting: Cardiology

## 2014-04-07 DIAGNOSIS — I5022 Chronic systolic (congestive) heart failure: Secondary | ICD-10-CM

## 2014-04-15 ENCOUNTER — Ambulatory Visit (INDEPENDENT_AMBULATORY_CARE_PROVIDER_SITE_OTHER): Payer: BC Managed Care – PPO | Admitting: Physician Assistant

## 2014-04-15 VITALS — BP 110/64 | HR 85 | Temp 98.3°F | Resp 16 | Ht 63.75 in | Wt 226.2 lb

## 2014-04-15 DIAGNOSIS — R350 Frequency of micturition: Secondary | ICD-10-CM

## 2014-04-15 LAB — POCT URINALYSIS DIPSTICK
Bilirubin, UA: NEGATIVE
Glucose, UA: NEGATIVE
Ketones, UA: NEGATIVE
Nitrite, UA: POSITIVE
Protein, UA: 100
Spec Grav, UA: 1.025
Urobilinogen, UA: 0.2
pH, UA: 5.5

## 2014-04-15 LAB — POCT UA - MICROSCOPIC ONLY
Casts, Ur, LPF, POC: NEGATIVE
Crystals, Ur, HPF, POC: NEGATIVE
Mucus, UA: NEGATIVE
Yeast, UA: NEGATIVE

## 2014-04-15 MED ORDER — NITROFURANTOIN MONOHYD MACRO 100 MG PO CAPS: 100.0000 mg | ORAL_CAPSULE | Freq: Two times a day (BID) | ORAL | Status: AC

## 2014-04-15 NOTE — Patient Instructions (Signed)
Get plenty of rest and drink at least 64 ounces of water daily. 

## 2014-04-15 NOTE — Progress Notes (Signed)
Subjective:    Patient ID: Cynthia Roth, female    DOB: 10/14/1950, 63 y.o.   MRN: [id number]   PCP: Maximino Greenland, MD  Chief Complaint  Patient presents with  . Urinary Frequency    No Known Allergies  Patient Active Problem List   Diagnosis Date Noted  . Biventricular implantable cardioverter-defibrillator in situ   . Nonischemic cardiomyopathy 03/28/2012  . Chronic systolic heart failure 34/28/7681  . HTN (hypertension) 12/02/2011  . Dyslipidemia 12/02/2011  . LBBB (left bundle branch block) 12/02/2011    Prior to Admission medications   Medication Sig Start Date End Date Taking? Authorizing Provider  aspirin 81 MG tablet Take 81 mg by mouth daily.   Yes Historical Provider, MD  carvedilol (COREG) 12.5 MG tablet TAKE 1 & 1/2 TABLETS BY MOUTH TWICE A DAY   Yes Larey Dresser, MD  CINNAMON PO Take by mouth daily.   Yes Historical Provider, MD  furosemide (LASIX) 20 MG tablet Take 1 tablet (20 mg total) by mouth as needed. 01/22/14  Yes Larey Dresser, MD  isosorbide-hydrALAZINE (BIDIL) 20-37.5 MG per tablet Take 1 tablet by mouth 3 (three) times daily. 08/20/13  Yes Larey Dresser, MD  lisinopril (PRINIVIL,ZESTRIL) 20 MG tablet TAKE 1 TABLET (20 MG TOTAL) BY MOUTH 2 (TWO) TIMES DAILY. 01/29/14  Yes Larey Dresser, MD  metaxalone [facility]) 800 MG tablet  03/27/14  Yes Historical Provider, MD  Multiple Vitamins-Minerals (CENTRUM SILVER PO) Take by mouth daily.   Yes Historical Provider, MD  spironolactone (ALDACTONE) 25 MG tablet TAKE 1 TABLET BY MOUTH EVERY DAY 04/11/14  Yes Jolaine Artist, MD  traMADol Veatrice Bourbon) 50 MG tablet  03/27/14  Yes Historical Provider, MD    Medical, Surgical, Family and Social History reviewed and updated.  HPI  This 63 y.o. female presents for evaluation of urinary frequency for about about a month. For the past 2-3 days notes a "feeling" at the end of urination, not burning, but irritating. She has had this before with a urinary  tract infection. No urgency. No hematuria. No abdominal or back pain. No fever or chills (other than when she's in a cold building). No nausea or vomiting. No vaginal discharge or itching. Not currently sexually active.  Review of Systems As above.    Objective:   Physical Exam  Constitutional: She is oriented to person, place, and time. Vital signs are normal. She appears well-developed and well-nourished. She is active and cooperative. No distress.  BP 110/64 mmHg  Pulse 85  Temp(Src) 98.3 F (36.8 C) (Oral)  Resp 16  Ht 5' 3.75" (1.619 m)  Wt 226 lb 4 oz (102.626 kg)  BMI 39.15 kg/m2  SpO2 98%   HENT:  Head: Normocephalic and atraumatic.  Cardiovascular: Normal rate, regular rhythm and normal heart sounds.   Pulmonary/Chest: Effort normal and breath sounds normal.  Abdominal: Soft. Normal appearance and bowel sounds are normal. She exhibits no distension and no mass. There is no hepatosplenomegaly. There is tenderness in the suprapubic area. There is no rigidity, no rebound, no guarding, no CVA tenderness, no tenderness at McBurney's point and negative Murphy's sign. No hernia.  Musculoskeletal: Normal range of motion.       Lumbar back: Normal.  Neurological: She is alert and oriented to person, place, and time.  Skin: Skin is warm and dry. No rash noted. She is not diaphoretic. No pallor.  Psychiatric: She has a normal mood and affect. Her speech is normal and  behavior is normal. Judgment normal.      Results for orders placed or performed in visit on 04/15/14  POCT UA - Microscopic Only  Result Value Ref Range   WBC, Ur, HPF, POC TNTC    RBC, urine, microscopic 2-4    Bacteria, U Microscopic 2+    Mucus, UA neg    Epithelial cells, urine per micros 2-3    Crystals, Ur, HPF, POC neg    Casts, Ur, LPF, POC neg    Yeast, UA neg   POCT urinalysis dipstick  Result Value Ref Range   Color, UA yellow    Clarity, UA cloudy    Glucose, UA neg    Bilirubin, UA neg     Ketones, UA neg    Spec Grav, UA 1.025    Blood, UA trace    pH, UA 5.5    Protein, UA 100    Urobilinogen, UA 0.2    Nitrite, UA positive    Leukocytes, UA large (3+)        Assessment & Plan:  1. Urinary frequency Suspect UTI. Empiric coverage. Await UCx. Supportive care, anticipatory guidance. RTC if symptoms worsen/persist. - POCT UA - Microscopic Only - POCT urinalysis dipstick - Urine culture - nitrofurantoin, macrocrystal-monohydrate, (MACROBID) 100 MG capsule; Take 1 capsule (100 mg total) by mouth 2 (two) times daily.  Dispense: 14 capsule; Refill: 0   Fara Chute, PA-C Physician Assistant-Certified [facility]

## 2014-04-18 LAB — URINE CULTURE: Colony Count: >=100000

## 2014-04-23 ENCOUNTER — Other Ambulatory Visit: Payer: Self-pay | Admitting: Cardiology

## 2014-04-29 ENCOUNTER — Telehealth: Payer: Self-pay | Admitting: *Deleted

## 2014-04-29 NOTE — Telephone Encounter (Signed)
Nira Conn, can you call and see what this is about? Should be able to get Bidil.  If not, will need hydralazine 37.5 mg tid + isordil 20 mg tid.

## 2014-04-29 NOTE — Telephone Encounter (Signed)
Pt states that she was told by pharmacy that Bidil is not available from manufacturer.  She is wanting to know what she can take to replace.  Will forward to Dr. [name]

## 2014-04-29 NOTE — Telephone Encounter (Signed)
Patient stated that the bidil is not available through the manufacturer and wants to know what she should do. Please advise. Thanks, MI

## 2014-04-30 NOTE — Telephone Encounter (Signed)
Spoke w/pharmacist at [facility] who states Bidil is on back order and they don't know date that will be available.  Advised pt.  She states she went to CHF clinic today and they had samples of Bidil.  Advised her that when she is about to finish samples either try to get additional samples or call pharmacy to see if they have date when medication would be available.  Advised  that we would have to break medicine into 2 meds per Dr. [name]- can give Hydralazien 37.5 mg TID + Isordil 20 mg TID.  She will call us if need new Rx sent to pharmacy.

## 2014-05-03 ENCOUNTER — Other Ambulatory Visit (HOSPITAL_COMMUNITY): Payer: Self-pay | Admitting: Internal Medicine

## 2014-05-03 DIAGNOSIS — Z1231 Encounter for screening mammogram for malignant neoplasm of breast: Secondary | ICD-10-CM

## 2014-05-09 ENCOUNTER — Encounter (HOSPITAL_COMMUNITY): Payer: Self-pay | Admitting: Cardiology

## 2014-05-15 ENCOUNTER — Ambulatory Visit (HOSPITAL_COMMUNITY)
Admission: RE | Admit: 2014-05-15 | Discharge: 2014-05-15 | Disposition: A | Discharge status: Home / Self Care | Payer: BC Managed Care – PPO | Source: Ambulatory Visit | Attending: Internal Medicine | Admitting: Internal Medicine

## 2014-05-15 DIAGNOSIS — Z1231 Encounter for screening mammogram for malignant neoplasm of breast: Secondary | ICD-10-CM | POA: Insufficient documentation

## 2014-06-02 IMAGING — CR DG CHEST 1V PORT
1 series · 1 of 1 positions shown · non-contrast
Comparison: Chest x-ray of 11/29/2011

CLINICAL DATA: Postop pacer insertion

PORTABLE CHEST - 1 VIEW

[AP]
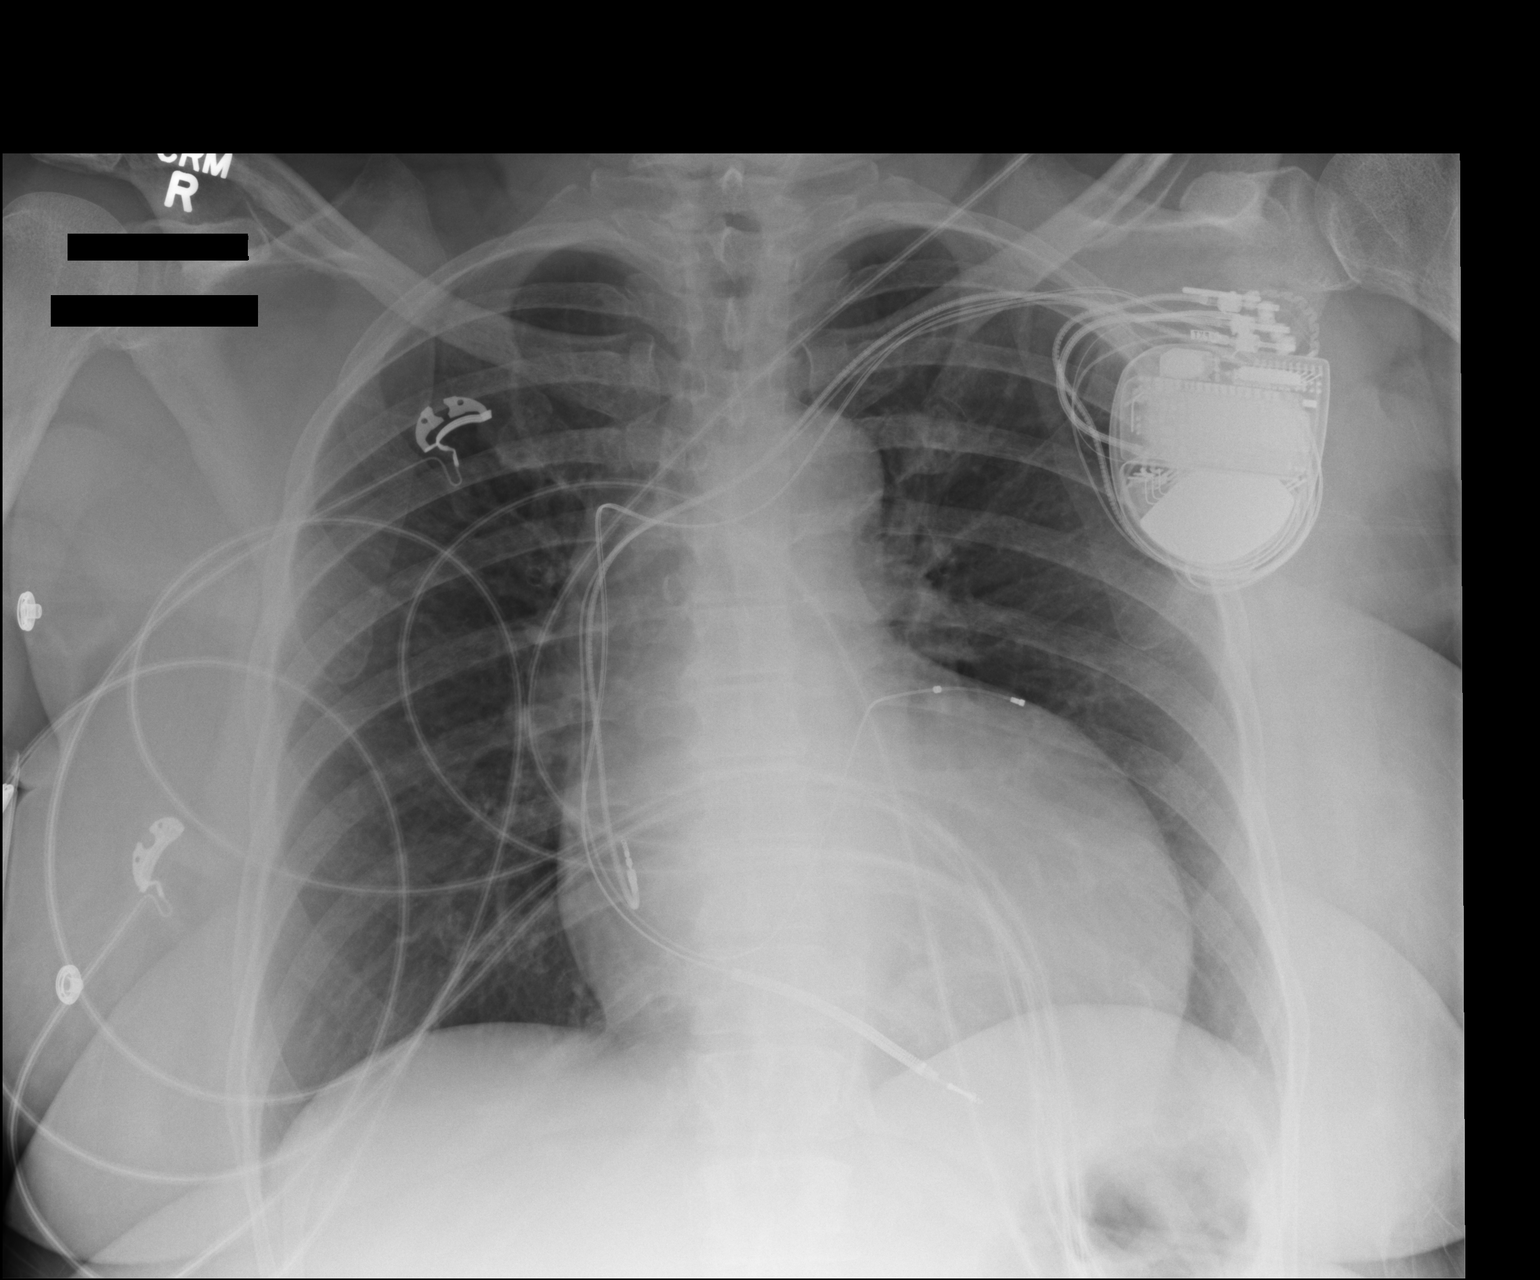

[1 of 1 positions shown; findings below may reference images not displayed]

FINDINGS: A dual lead permanent pacemaker is now present.
Cardiomegaly is stable.  No active infiltrate or effusion is seen.
No bony abnormality is noted.
IMPRESSION: Dual lead permanent pacemaker is now present.  Stable cardiomegaly.

## 2014-06-08 ENCOUNTER — Other Ambulatory Visit: Payer: Self-pay | Admitting: Cardiology

## 2014-06-25 ENCOUNTER — Ambulatory Visit (INDEPENDENT_AMBULATORY_CARE_PROVIDER_SITE_OTHER): Payer: BC Managed Care – PPO | Admitting: Internal Medicine

## 2014-06-25 ENCOUNTER — Encounter: Payer: Self-pay | Admitting: Internal Medicine

## 2014-06-25 VITALS — BP 102/60 | HR 76 | Ht 64.0 in | Wt 224.4 lb

## 2014-06-25 DIAGNOSIS — Z4502 Encounter for adjustment and management of automatic implantable cardiac defibrillator: Secondary | ICD-10-CM

## 2014-06-25 DIAGNOSIS — I428 Other cardiomyopathies: Secondary | ICD-10-CM

## 2014-06-25 DIAGNOSIS — I429 Cardiomyopathy, unspecified: Secondary | ICD-10-CM | POA: Diagnosis not present

## 2014-06-25 DIAGNOSIS — I5022 Chronic systolic (congestive) heart failure: Secondary | ICD-10-CM

## 2014-06-25 DIAGNOSIS — Z95810 Presence of automatic (implantable) cardiac defibrillator: Secondary | ICD-10-CM

## 2014-06-25 LAB — MDC_IDC_ENUM_SESS_TYPE_INCLINIC
Battery Remaining Longevity: 53 mo
Battery Voltage: 2.98 V
Brady Statistic AP VP Percent: 0.18 %
Brady Statistic AP VS Percent: 0.04 %
Brady Statistic AS VP Percent: 98.03 %
Brady Statistic AS VS Percent: 1.75 %
Brady Statistic RA Percent Paced: 0.22 %
Brady Statistic RV Percent Paced: 6.1 %
Date Time Interrogation Session: 20160126171806
HighPow Impedance: 57 Ohm
HighPow Impedance: 78 Ohm
Lead Channel Impedance Value: 399 Ohm
Lead Channel Impedance Value: 532 Ohm
Lead Channel Impedance Value: 551 Ohm
Lead Channel Impedance Value: 589 Ohm
Lead Channel Impedance Value: 988 Ohm
Lead Channel Pacing Threshold Amplitude: 0.5 V
Lead Channel Pacing Threshold Amplitude: 0.75 V
Lead Channel Pacing Threshold Amplitude: 2.75 V
Lead Channel Pacing Threshold Pulse Width: 0.4 ms
Lead Channel Pacing Threshold Pulse Width: 0.4 ms
Lead Channel Pacing Threshold Pulse Width: 0.8 ms
Lead Channel Sensing Intrinsic Amplitude: 16.625 mV
Lead Channel Sensing Intrinsic Amplitude: 3.625 mV
Lead Channel Setting Pacing Amplitude: 2 V
Lead Channel Setting Pacing Amplitude: 2.5 V
Lead Channel Setting Pacing Amplitude: 3.25 V
Lead Channel Setting Pacing Pulse Width: 0.4 ms
Lead Channel Setting Pacing Pulse Width: 0.8 ms
Lead Channel Setting Sensing Sensitivity: 0.3 mV
Zone Setting Detection Interval: 250 ms
Zone Setting Detection Interval: 300 ms
Zone Setting Detection Interval: 350 ms
Zone Setting Detection Interval: 450 ms

## 2014-06-25 MED ORDER — ISOSORB DINITRATE-HYDRALAZINE 20-37.5 MG PO TABS: ORAL_TABLET | ORAL | Status: DC

## 2014-06-25 NOTE — Patient Instructions (Addendum)
Your physician recommends that you continue on your current medications as directed. Please refer to the Current Medication list given to you today.  Remote monitoring is used to monitor your Pacemaker of ICD from home. This monitoring reduces the number of office visits required to check your device to one time per year. It allows [place] to keep an eye on the functioning of your device to ensure it is working properly. You are scheduled for a device check from home on 09/24/14. You may send your transmission at any time that day. If you have a wireless device, the transmission will be sent automatically. After your physician reviews your transmission, you will receive a postcard with your next transmission date.  Your physician wants you to follow-up in: 1 year with Dr. Caryl Comes.  You will receive a reminder letter in the mail two months in advance. If you don't receive a letter, please call our office to schedule the follow-up appointment.  Your physician recommends that you return for lab work on February 2 for [employer]

## 2014-06-25 NOTE — Progress Notes (Signed)
Patient Care Team: Glendale Chard, MD as PCP - General (Internal Medicine) Larey Dresser, MD as Consulting Physician (Cardiology) Juanita Craver, MD as Consulting Physician (Gastroenterology)   HPI  Cynthia Roth is a 64 y.o. female Seen in followup for congestive heart failure with CRT-D implantation for nonischemic cardiomyopathy 12/13  She is considerably improved with improved exercise tolerance; no edema or lightheadedness  K ok 10/14     Past Medical History  Diagnosis Date  . GERD (gastroesophageal reflux disease)   . Hx of colonic polyps   . Tubular adenoma 1999    history of  . Nonischemic cardiomyopathy   . Chronic systolic heart failure 02/01/7168  . LBBB (left bundle branch block) 12/02/2011  . Dyslipidemia 12/02/2011  . Hypertension     "used to have this; now my BP is low" (05/10/2012)  . Gout     "very seldom" (05/10/2012)  . Biventricular ICD -Medtronic     DOI 12/13 - Medtronic Viva XR CRT-D defibrillator, serial #[id number] H.    . CHF (congestive heart failure)   . Allergy     Past Surgical History  Procedure Laterality Date  . Cardiac catheterization  12-03-11    selectie coronary angiography  . Cardiac defibrillator placement  05/10/2012    CRT-D implantation (05/10/2012)  . Partial hysterectomy  1980's  . Left heart catheterization with coronary angiogram N/A 12/03/2011    Procedure: LEFT HEART CATHETERIZATION WITH CORONARY ANGIOGRAM;  Surgeon: Larey Dresser, MD;  Location[facility] C[facility] LAB;  Service: Cardiovascular;  Laterality: N/A;  . Bi-ventricular implantable cardioverter defibrillator N/A 05/10/2012    Procedure: BI-VENTRICULAR IMPLANTABLE CARDIOVERTER DEFIBRILLATOR  (CRT-D);  Surgeon: Deboraha Sprang, MD;  Location: [facility] CATH LAB;  Service: Cardiovascular;  Laterality: N/A;    Current Outpatient Prescriptions  Medication Sig Dispense Refill  . aspirin 81 MG tablet Take 81 mg by mouth daily.    [name] BIDIL 20-37.5 MG per tablet TAKE 1 TABLET BY  MOUTH 3 (THREE) TIMES DAILY. (Patient taking differently: TAKE 1 TABLET BY MOUTH 3 (THREE) TIMES DAILY. ---- pt taking 2 times a day due to pharmacy not having medication) 90 tablet 2  . carvedilol (COREG) 12.5 MG tablet TAKE 1 & 1/2 TABLETS BY MOUTH TWICE A DAY 90 tablet 5  . CINNAMON PO Take by mouth daily.    . furosemide (LASIX) 20 MG tablet Take 1 tablet (20 mg total) by mouth as needed. 15 tablet 6  . lisinopril (PRINIVIL,ZESTRIL) 20 MG tablet TAKE 1 TABLET (20 MG TOTAL) BY MOUTH 2 (TWO) TIMES DAILY. 60 tablet 3  . metaxalone (SKELAXIN) 800 MG tablet   0  . Multiple Vitamins-Minerals (CENTRUM SILVER PO) Take by mouth daily.    [name] spironolactone (ALDACTONE) 25 MG tablet TAKE 1 TABLET BY MOUTH EVERY DAY 30 tablet 3  . traMADol (ULTRAM) 50 MG tablet Take 50 mg by mouth as needed for moderate pain.   0   No current facility-administered medications for this visit.    No Known Allergies  Review of Systems negative except from HPI and PMH  Physical Exam BP 102/60 mmHg  Pulse 76  Ht 5\' 4"  (1.626 m)  Wt 224 lb 6.4 oz (101.787 kg)  BMI 38.50 kg/m2 Well developed and nourished in no acute distress HENT normal Neck supple with JVP-flat Clear Regular rate and rhythm, no murmurs or gallops Abd-soft with active BS No Clubbing cyanosis edema Skin-warm and dry A & Oriented  Grossly normal  sensory and motor function Device pocket well healed; without hematoma or erythema.  There is no tethering   ECG demonstrates sinus rhythm p synchronous pacing with a left bundle branch block configuration and a QRS duration of 148  Assessment and  Plan  Nonischemic cardiomyopathy  Hypertension  Systolic heart failure-chronic  Implantable defibrillator-Medtronic-CRT  Euvolemic continue current meds  Blood pressure well controlled  On Guideline directed medical therapy for cardiomyopathy  Will resume bidil tid  Discussed the need for closer surveillance of K with aldactone Will check  [facility]

## 2014-07-02 ENCOUNTER — Other Ambulatory Visit (INDEPENDENT_AMBULATORY_CARE_PROVIDER_SITE_OTHER): Payer: BC Managed Care – PPO | Admitting: *Deleted

## 2014-07-02 DIAGNOSIS — I5022 Chronic systolic (congestive) heart failure: Secondary | ICD-10-CM

## 2014-07-02 LAB — BASIC METABOLIC PANEL
BUN: 19 mg/dL (ref 6–23)
CO2: 28 mEq/L (ref 19–32)
Calcium: 9.5 mg/dL (ref 8.4–10.5)
Chloride: 106 mEq/L (ref 96–112)
Creatinine, Ser: 1.08 mg/dL (ref 0.40–1.20)
GFR: 65.96 mL/min (ref >60.00)
Glucose, Bld: 94 mg/dL (ref 70–99)
Potassium: 4.3 mEq/L (ref 3.5–5.1)
Sodium: 139 mEq/L (ref 135–145)

## 2014-07-24 ENCOUNTER — Emergency Department (HOSPITAL_COMMUNITY)
Admission: EM | Admit: 2014-07-24 | Discharge: 2014-07-24 | Disposition: A | Discharge status: Home / Self Care | Payer: BC Managed Care – PPO | Attending: Emergency Medicine | Admitting: Emergency Medicine

## 2014-07-24 ENCOUNTER — Encounter (HOSPITAL_COMMUNITY): Payer: Self-pay | Admitting: *Deleted

## 2014-07-24 DIAGNOSIS — I5042 Chronic combined systolic (congestive) and diastolic (congestive) heart failure: Secondary | ICD-10-CM | POA: Diagnosis not present

## 2014-07-24 DIAGNOSIS — Z86018 Personal history of other benign neoplasm: Secondary | ICD-10-CM | POA: Diagnosis not present

## 2014-07-24 DIAGNOSIS — R112 Nausea with vomiting, unspecified: Secondary | ICD-10-CM | POA: Diagnosis not present

## 2014-07-24 DIAGNOSIS — Z79899 Other long term (current) drug therapy: Secondary | ICD-10-CM | POA: Insufficient documentation

## 2014-07-24 DIAGNOSIS — I10 Essential (primary) hypertension: Secondary | ICD-10-CM | POA: Diagnosis not present

## 2014-07-24 DIAGNOSIS — I959 Hypotension, unspecified: Secondary | ICD-10-CM | POA: Diagnosis not present

## 2014-07-24 DIAGNOSIS — R197 Diarrhea, unspecified: Secondary | ICD-10-CM | POA: Diagnosis not present

## 2014-07-24 DIAGNOSIS — Z86010 Personal history of colonic polyps: Secondary | ICD-10-CM | POA: Diagnosis not present

## 2014-07-24 DIAGNOSIS — Z95810 Presence of automatic (implantable) cardiac defibrillator: Secondary | ICD-10-CM | POA: Diagnosis not present

## 2014-07-24 DIAGNOSIS — Z8639 Personal history of other endocrine, nutritional and metabolic disease: Secondary | ICD-10-CM | POA: Diagnosis not present

## 2014-07-24 DIAGNOSIS — Z7982 Long term (current) use of aspirin: Secondary | ICD-10-CM | POA: Diagnosis not present

## 2014-07-24 DIAGNOSIS — Z9889 Other specified postprocedural states: Secondary | ICD-10-CM | POA: Diagnosis not present

## 2014-07-24 DIAGNOSIS — Z8739 Personal history of other diseases of the musculoskeletal system and connective tissue: Secondary | ICD-10-CM | POA: Insufficient documentation

## 2014-07-24 DIAGNOSIS — Z8719 Personal history of other diseases of the digestive system: Secondary | ICD-10-CM | POA: Diagnosis not present

## 2014-07-24 LAB — COMPREHENSIVE METABOLIC PANEL
ALT: 17 U/L (ref 0–35)
AST: 21 U/L (ref 0–37)
Albumin: 3.9 g/dL (ref 3.5–5.2)
Alkaline Phosphatase: 49 U/L (ref 39–117)
Anion gap: 8 (ref 5–15)
BUN: 24 mg/dL — ABNORMAL HIGH (ref 6–23)
CO2: 27 mmol/L (ref 19–32)
Calcium: 9.6 mg/dL (ref 8.4–10.5)
Chloride: 104 mmol/L (ref 96–112)
Creatinine, Ser: 1.35 mg/dL — ABNORMAL HIGH (ref 0.50–1.10)
GFR calc Af Amer: 48 mL/min — ABNORMAL LOW (ref >90)
GFR calc non Af Amer: 41 mL/min — ABNORMAL LOW (ref >90)
Glucose, Bld: 92 mg/dL (ref 70–99)
Potassium: 3.8 mmol/L (ref 3.5–5.1)
Sodium: 139 mmol/L (ref 135–145)
Total Bilirubin: 0.6 mg/dL (ref 0.3–1.2)
Total Protein: 7.1 g/dL (ref 6.0–8.3)

## 2014-07-24 LAB — CBC WITH DIFFERENTIAL/PLATELET
Basophils Absolute: 0 10*3/uL (ref 0.0–0.1)
Basophils Relative: 1 % (ref 0–1)
Eosinophils Absolute: 0.1 10*3/uL (ref 0.0–0.7)
Eosinophils Relative: 3 % (ref 0–5)
HCT: 32.4 % — ABNORMAL LOW (ref 36.0–46.0)
Hemoglobin: 11.2 g/dL — ABNORMAL LOW (ref 12.0–15.0)
Lymphocytes Relative: 35 % (ref 12–46)
Lymphs Abs: 1.5 10*3/uL (ref 0.7–4.0)
MCH: 32 pg (ref 26.0–34.0)
MCHC: 34.6 g/dL (ref 30.0–36.0)
MCV: 92.6 fL (ref 78.0–100.0)
Monocytes Absolute: 0.6 10*3/uL (ref 0.1–1.0)
Monocytes Relative: 13 % — ABNORMAL HIGH (ref 3–12)
Neutro Abs: 2 10*3/uL (ref 1.7–7.7)
Neutrophils Relative %: 48 % (ref 43–77)
Platelets: 233 10*3/uL (ref 150–400)
RBC: 3.5 MIL/uL — ABNORMAL LOW (ref 3.87–5.11)
RDW: 12.9 % (ref 11.5–15.5)
WBC: 4.2 10*3/uL (ref 4.0–10.5)

## 2014-07-24 LAB — BRAIN NATRIURETIC PEPTIDE: B Natriuretic Peptide: 9 pg/mL (ref 0.0–100.0)

## 2014-07-24 LAB — LIPASE, BLOOD: Lipase: 39 U/L (ref 11–59)

## 2014-07-24 MED ORDER — SODIUM CHLORIDE 0.9 % IV SOLN
INTRAVENOUS | Status: AC
  Administered 2014-07-24: 14:00:00 via INTRAVENOUS

## 2014-07-24 NOTE — ED Notes (Signed)
Pt was sent to ED by [facility] rt hypotension. Pt is asymptomatic at this time. Pt has hx of CHF and has a pacemaker/defib (Medtronic). Pt denies any chest pain, SOB, dizziness. Pt does report a slight lightheadedness this morning. Pt reports she had N/V/D on Monday, none since.

## 2014-07-24 NOTE — ED Notes (Signed)
Pt is in stable condition upon d/c and ambulates from ED. 

## 2014-07-24 NOTE — ED Provider Notes (Signed)
CSN: [id number]     Arrival date & time 07/24/14  1253 History   First MD Initiated Contact with Patient 07/24/14 1256     Chief Complaint  Patient presents with  . Hypotension     (Consider location/radiation/quality/duration/timing/severity/associated sxs/prior Treatment) HPI Patient presents with concern of hypotension. Patient was a primary care visit, found hypotensive, so here for evaluation. Patient acknowledges multiple medical issues, including cardiomyopathy, CHF. More notably, the patient states that 2 days ago she had GI illness, with multiple episodes of vomiting and diarrhea. Those symptoms have resolved entirely, and in the past 24 hours she has had no additional GI symptoms. She also denies any current fever, dyspnea, cough, chills, abdominal or chest pain. Symptoms resolved spontaneously.  Past Medical History  Diagnosis Date  . GERD (gastroesophageal reflux disease)   . Hx of colonic polyps   . Tubular adenoma 1999    history of  . Nonischemic cardiomyopathy   . Chronic systolic heart failure 11/28/6965  . LBBB (left bundle branch block) 12/02/2011  . Dyslipidemia 12/02/2011  . Hypertension     "used to have this; now my BP is low" (05/10/2012)  . Gout     "very seldom" (05/10/2012)  . Biventricular ICD -Medtronic     DOI 12/13 - Medtronic Viva XR CRT-D defibrillator, serial #[id number] H.    . CHF (congestive heart failure)   . Allergy    Past Surgical History  Procedure Laterality Date  . Cardiac catheterization  12-03-11    selectie coronary angiography  . Cardiac defibrillator placement  05/10/2012    CRT-D implantation (05/10/2012)  . Partial hysterectomy  1980's  . Left heart catheterization with coronary angiogram N/A 12/03/2011    Procedure: LEFT HEART CATHETERIZATION WITH CORONARY ANGIOGRAM;  Surgeon: Larey Dresser, MD;  Location: [facility] CATH LAB;  Service: Cardiovascular;  Laterality: N/A;  . Bi-ventricular implantable cardioverter defibrillator N/A  05/10/2012    Procedure: BI-VENTRICULAR IMPLANTABLE CARDIOVERTER DEFIBRILLATOR  (CRT-D);  Surgeon: Deboraha Sprang, MD;  Location: [facility] CATH LAB;  Service: Cardiovascular;  Laterality: N/A;   Family History  Problem Relation Age of Onset  . Heart failure Maternal Grandmother     diagnosed in age of 105's. still living at age of 110's.  . Colon cancer      family history  . Angina Mother     diagnosed at age of 37's, unsure whether it was a true diagnosis. unsure about the treatment  . Memory loss Mother   . Sleep apnea Brother   . Kidney disease Father   . Diabetes Brother    History  Substance Use Topics  . Smoking status: Never Smoker   . Smokeless tobacco: Never Used  . Alcohol Use: No   OB History    No data available     Review of Systems  Constitutional:       Per HPI, otherwise negative  HENT:       Per HPI, otherwise negative  Respiratory:       Per HPI, otherwise negative  Cardiovascular:       Per HPI, otherwise negative  Gastrointestinal: Positive for nausea, vomiting and diarrhea. Negative for abdominal pain.  Endocrine:       Negative aside from HPI  Genitourinary:       Neg aside from HPI   Musculoskeletal:       Per HPI, otherwise negative  Skin: Negative.   Neurological: Negative for syncope.      Allergies  Review  of patient's allergies indicates no known allergies.  Home Medications   Prior to Admission medications   Medication Sig Start Date End Date Taking? Authorizing Provider  aspirin 81 MG tablet Take 81 mg by mouth daily.    Historical Provider, MD  carvedilol (COREG) 12.5 MG tablet TAKE 1 & 1/2 TABLETS BY MOUTH TWICE A DAY 06/10/14   Jolaine Artist, MD  CINNAMON PO Take by mouth daily.    Historical Provider, MD  furosemide (LASIX) 20 MG tablet Take 1 tablet (20 mg total) by mouth as needed. 01/22/14   Larey Dresser, MD  isosorbide-hydrALAZINE (BIDIL) 20-37.5 MG per tablet TAKE 1 TABLET BY MOUTH 3 (THREE) TIMES DAILY 06/25/14   Deboraha Sprang, MD  lisinopril (PRINIVIL,ZESTRIL) 20 MG tablet TAKE 1 TABLET (20 MG TOTAL) BY MOUTH 2 (TWO) TIMES DAILY. 06/10/14   Jolaine Artist, MD  metaxalone [facility]) 800 MG tablet  03/27/14   Historical Provider, MD  Multiple Vitamins-Minerals (CENTRUM SILVER PO) Take by mouth daily.    Historical Provider, MD  spironolactone (ALDACTONE) 25 MG tablet TAKE 1 TABLET BY MOUTH EVERY DAY 04/11/14   Jolaine Artist, MD  traMADol (ULTRAM) 50 MG tablet Take 50 mg by mouth as needed for moderate pain.  03/27/14   Historical Provider, MD   BP 92/47 mmHg  Pulse 71  Temp(Src) 98 F (36.7 C) (Oral)  Resp 15  SpO2 100% Physical Exam  Constitutional: She is oriented to person, place, and time. She appears well-developed and well-nourished. No distress.  HENT:  Head: Normocephalic and atraumatic.  Eyes: Conjunctivae and EOM are normal.  Cardiovascular: Normal rate and regular rhythm.   Pulmonary/Chest: Effort normal and breath sounds normal. No stridor. No respiratory distress.  Abdominal: She exhibits no distension.  Musculoskeletal: She exhibits no edema.  Neurological: She is alert and oriented to person, place, and time. No cranial nerve deficit.  Skin: Skin is warm and dry.  Psychiatric: She has a normal mood and affect.  Nursing note and vitals reviewed.   ED Course  Procedures (including critical care time) Labs Review Labs Reviewed  COMPREHENSIVE METABOLIC PANEL  LIPASE, BLOOD  CBC WITH DIFFERENTIAL/PLATELET  URINALYSIS, ROUTINE W REFLEX MICROSCOPIC  BRAIN NATRIURETIC PEPTIDE   Cardiac 70 sinus rhythm, with pacer spikes occasionally, abnormal Pulse ox 99% room air normal I reviewed the patient's chart, including the interpretation of her history of cardiomyopathy, requiring a smoker placement.   4:06 PM Patient awake and alert, watching "Hassell Done".  No new complaints.  She is aware of all findings.  MDM  Patient presents with concern of possible hypotension. Patient has  multiple medical issues, and had a notable GI illness a few days ago.  Here, the patient is awake and alert, borderline hypotensive initially, though normotensive after fluid resuscitation. No evidence for peritonitis or other ongoing intra-abdominal processes, no evidence for kidney failure.  Patient was discharged in stable condition after she improved with fluids, had the aforementioned reassuring findings, vitals.    Carmin Muskrat, MD 07/24/14 [phone]

## 2014-07-24 NOTE — Discharge Instructions (Signed)
As discussed, your evaluation today has been largely reassuring.  But, it is important that you monitor your condition carefully, and do not hesitate to return to the ED if you develop new, or concerning changes in your condition. ? ?Otherwise, please follow-up with your physician for appropriate ongoing care. ? ?

## 2014-07-24 NOTE — ED Notes (Signed)
Pt aware of urine sample needed. Pt unable to go at this time. 

## 2014-08-05 ENCOUNTER — Other Ambulatory Visit: Payer: Self-pay | Admitting: Cardiology

## 2014-08-09 ENCOUNTER — Other Ambulatory Visit (HOSPITAL_COMMUNITY): Payer: Self-pay | Admitting: *Deleted

## 2014-08-09 DIAGNOSIS — I5022 Chronic systolic (congestive) heart failure: Secondary | ICD-10-CM

## 2014-08-09 MED ORDER — SPIRONOLACTONE 25 MG PO TABS: 25.0000 mg | ORAL_TABLET | Freq: Every day | ORAL | Status: DC

## 2014-08-12 ENCOUNTER — Encounter: Payer: Self-pay | Admitting: Family Medicine

## 2014-08-12 ENCOUNTER — Ambulatory Visit (INDEPENDENT_AMBULATORY_CARE_PROVIDER_SITE_OTHER): Payer: BC Managed Care – PPO | Admitting: Family Medicine

## 2014-08-12 VITALS — BP 110/54 | HR 74 | Temp 98.4°F | Resp 18 | Ht 64.5 in | Wt 218.6 lb

## 2014-08-12 DIAGNOSIS — B351 Tinea unguium: Secondary | ICD-10-CM

## 2014-08-12 DIAGNOSIS — R110 Nausea: Secondary | ICD-10-CM

## 2014-08-12 DIAGNOSIS — R42 Dizziness and giddiness: Secondary | ICD-10-CM | POA: Diagnosis not present

## 2014-08-12 LAB — POCT CBC
Granulocyte percent: 42.6 %G (ref 37–80)
HCT, POC: 32.5 % — AB (ref 37.7–47.9)
Hemoglobin: 10.1 g/dL — AB (ref 12.2–16.2)
Lymph, poc: 2.5 (ref 0.6–3.4)
MCH, POC: 30.2 pg (ref 27–31.2)
MCHC: 31.3 g/dL — AB (ref 31.8–35.4)
MCV: 96.6 fL (ref 80–97)
MID (cbc): 0.4 (ref 0–0.9)
MPV: 5.9 fL (ref 0–99.8)
POC Granulocyte: 2.1 (ref 2–6.9)
POC LYMPH PERCENT: 49.5 %L (ref 10–50)
POC MID %: 7.9 %M (ref 0–12)
Platelet Count, POC: 429 10*3/uL — AB (ref 142–424)
RBC: 3.36 M/uL — AB (ref 4.04–5.48)
RDW, POC: 14.2 %
WBC: 5 10*3/uL (ref 4.6–10.2)

## 2014-08-12 MED ORDER — TERBINAFINE HCL 250 MG PO TABS
250.0000 mg | ORAL_TABLET | Freq: Every day | ORAL | Status: DC
Start: 2014-08-12 — End: 2015-06-13

## 2014-08-12 NOTE — Patient Instructions (Addendum)
Please don't take BIDIL for the next three days and see if you don't start feeling better.      Ringworm, Nail A fungal infection of the nail (tinea unguium/onychomycosis) is common. It is common as the visible part of the nail is composed of dead cells which have no blood supply to help prevent infection. It occurs because fungi are everywhere and will pick any opportunity to grow on any dead material. Because nails are very slow growing they require up to 2 years of treatment with anti-fungal medications. The entire nail back to the base is infected. This includes approximately  of the nail which you cannot see. If your caregiver has prescribed a medication by mouth, take it every day and as directed. No progress will be seen for at least 6 to 9 months. Do not be disappointed! Because fungi live on dead cells with little or no exposure to blood supply, medication delivery to the infection is slow; thus the cure is slow. It is also why you can observe no progress in the first 6 months. The nail becoming cured is the base of the nail, as it has the blood supply. Topical medication such as creams and ointments are usually not effective. Important in successful treatment of nail fungus is closely following the medication regimen that your doctor prescribes. Sometimes you and your caregiver may elect to speed up this process by surgical removal of all the nails. Even this may still require 6 to 9 months of additional oral medications. See your caregiver as directed. Remember there will be no visible improvement for at least 6 months. See your caregiver sooner if other signs of infection (redness and swelling) develop. Document Released: 05/14/2000 Document Revised: 08/09/2011 Document Reviewed: 07/23/2008 [facility] Patient Information 2015 [place], [place]. This information is not intended to replace advice given to you by your health care provider. Make sure you discuss any questions you have with your health  care provider.

## 2014-08-12 NOTE — Progress Notes (Signed)
Subjective:    Patient ID: Cynthia Roth, female    DOB: 04/01/1951, 64 y.o.   MRN: [id number]  HPI Chief Complaint  Patient presents with   Nausea    4 weeks   Dizziness   Hand Pain    right finger   This chart was scribed for Robyn Haber, MD by Thea Alken, ED Scribe. This patient was seen in room 11 and the patient's care was started at 6:56 PM.  HPI Comments: Cynthia Roth is a 64 y.o. female who presents to the [facility] complaining of constant nausea. Pt states symptoms began 5 weeks ago with nausea, diarrhea and emesis. She states she was sent to the hospital 2 days after symptoms began for dehydration where she received fluids. There was also a concern of hypotension when seen in the ED. She reports ever since than for the past 4 weeks she has been nauseated. Pt states she has taken Pepto bismol but reports when medication runs out she becomes nausea again. Pt states she has also had dizziness for 2 weeks when standing up. Pt reports hx of CHF. She is seen at [facility] cardiology by Dr. Caryl Comes and Dr. [name].  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   Pt also c/o of right 3rd finger pain. Pt states she was seen in that past for same complaint and was prescribes jublia.   Past Medical History  Diagnosis Date   GERD (gastroesophageal reflux disease)    Hx of colonic polyps    Tubular adenoma 1999    history of   Nonischemic cardiomyopathy    Chronic systolic heart failure 09/30/9765   LBBB (left bundle branch block) 12/02/2011   Dyslipidemia 12/02/2011   Hypertension     "used to have this; now my BP is low" (05/10/2012)   Gout     "very seldom" (05/10/2012)   Biventricular ICD -Medtronic     DOI 12/13 - Medtronic Viva XR CRT-D defibrillator, serial #[id number].     CHF (congestive heart failure)    Allergy    Past Surgical History  Procedure Laterality Date   Cardiac catheterization  12-03-11    selectie coronary angiography    Cardiac defibrillator placement  05/10/2012    CRT-D implantation (05/10/2012)   Partial hysterectomy  1980's   Left heart catheterization with coronary angiogram N/A 12/03/2011    Procedure: LEFT HEART CATHETERIZATION WITH CORONARY ANGIOGRAM;  Surgeon: Larey Dresser, MD;  Location: [facility] CATH LAB;  Service: Cardiovascular;  Laterality: N/A;   Bi-ventricular implantable cardioverter defibrillator N/A 05/10/2012    Procedure: BI-VENTRICULAR IMPLANTABLE CARDIOVERTER DEFIBRILLATOR  (CRT-D);  Surgeon: Deboraha Sprang, MD;  Location: [facility] C[facility] LAB;  Service: Cardiovascular;  Laterality: N/A;   Prior to Admission medications   Medication Sig Start Date End Date Taking? Authorizing Provider  aspirin 81 MG tablet Take 81 mg by mouth daily.   Yes Historical Provider, MD  carvedilol (COREG) 12.5 MG tablet TAKE 1 & 1/2 TABLETS BY MOUTH TWICE A DAY 06/10/14  Yes Jolaine Artist, MD  Cholecalciferol (VITAMIN D) 2000 UNITS CAPS Take 2,000 Units by mouth daily.   Yes Historical Provider, MD  CINNAMON PO Take 2,000 mg by mouth daily.    Yes Historical Provider, MD  cyclobenzaprine (FLEXERIL) 10 MG tablet Take 10 mg by mouth 3 (three) times daily as needed for muscle spasms.   Yes Historical Provider, MD  docusate sodium (COLACE) 100 MG capsule Take 100 mg  by mouth daily as needed for mild constipation.   Yes Historical Provider, MD  furosemide (LASIX) 20 MG tablet TAKE 1 TABLET BY MOUTH EVERY OTHER DAY 08/06/14  Yes Larey Dresser, MD  isosorbide-hydrALAZINE (BIDIL) 20-37.5 MG per tablet TAKE 1 TABLET BY MOUTH 3 (THREE) TIMES DAILY 06/25/14  Yes Deboraha Sprang, MD  lisinopril (PRINIVIL,ZESTRIL) 20 MG tablet TAKE 1 TABLET (20 MG TOTAL) BY MOUTH 2 (TWO) TIMES DAILY. 06/10/14  Yes Jolaine Artist, MD  Multiple Vitamins-Minerals (CENTRUM SILVER PO) Take by mouth daily.   Yes Historical Provider, MD  spironolactone (ALDACTONE) 25 MG tablet Take 1 tablet (25 mg total) by mouth daily. 08/09/14  Yes Jolaine Artist, MD   traMADol (ULTRAM) 50 MG tablet Take 50 mg by mouth as needed for moderate pain.  03/27/14  Yes Historical Provider, MD  Cyanocobalamin (VITAMIN B-12 PO) Take 1 drop by mouth daily. Place one drop under tongue and wait 1 minute to swallow.    Historical Provider, MD   Review of Systems  Gastrointestinal: Positive for nausea. Negative for vomiting and diarrhea.  Neurological: Positive for dizziness and light-headedness.       Objective:   Physical Exam  Constitutional: She is oriented to person, place, and time. She appears well-developed and well-nourished. No distress.  HENT:  Head: Normocephalic and atraumatic.  Eyes: Conjunctivae and EOM are normal.  Neck: Neck supple.  Cardiovascular: Normal rate.   110/60 while sitting 110/60 standing  Pulmonary/Chest: Effort normal. She has no rales.  Musculoskeletal: Normal range of motion. She exhibits no edema.  Neurological: She is alert and oriented to person, place, and time.  Skin: Skin is warm and dry.  Psychiatric: She has a normal mood and affect. Her behavior is normal.  Nursing note and vitals reviewed.  right middle finger shows separation of the distal nail about halfway down the matrix. There is no cuticle involvement at the lunula and no skin changes. Filed Vitals:   08/12/14 1844  BP: 110/54  Pulse: 74  Temp: 98.4 F (36.9 C)  TempSrc: Oral  Resp: 18  Height: 5' 4.5" (1.638 m)  Weight: 218 lb 9.6 oz (99.156 kg)  SpO2: 96%   Results for orders placed or performed in visit on 08/12/14  POCT CBC  Result Value Ref Range   WBC 5.0 4.6 - 10.2 K/uL   Lymph, poc 2.5 0.6 - 3.4   POC LYMPH PERCENT 49.5 10 - 50 %L   MID (cbc) 0.4 0 - 0.9   POC MID % 7.9 0 - 12 %M   POC Granulocyte 2.1 2 - 6.9   Granulocyte percent 42.6 37 - 80 %G   RBC 3.36 (A) 4.04 - 5.48 M/uL   Hemoglobin 10.1 (A) 12.2 - 16.2 g/dL   HCT, POC 32.5 (A) 37.7 - 47.9 %   MCV 96.6 80 - 97 fL   MCH, POC 30.2 27 - 31.2 pg   MCHC 31.3 (A) 31.8 - 35.4 g/dL    RDW, POC 14.2 %   Platelet Count, POC 429 (A) 142 - 424 K/uL   MPV 5.9 0 - 99.8 fL   Assessment & Plan:   1. Nausea without vomiting   2. Orthostatic lightheadedness   3. Onychomycosis     This chart was scribed in my presence and reviewed by me personally.    ICD-9-CM ICD-10-CM   1. Nausea without vomiting 787.02 R11.0 POCT CBC     Comprehensive metabolic panel  2. Orthostatic lightheadedness 780.4  R42 POCT CBC     Comprehensive metabolic panel  3. Onychomycosis 110.1 B35.1 terbinafine (LAMISIL) 250 MG tablet     Signed, Robyn Haber, MD

## 2014-08-13 LAB — COMPREHENSIVE METABOLIC PANEL
ALT: 22 U/L (ref 0–35)
AST: 16 U/L (ref 0–37)
Albumin: 4.1 g/dL (ref 3.5–5.2)
Alkaline Phosphatase: 49 U/L (ref 39–117)
BUN: 18 mg/dL (ref 6–23)
CO2: 25 mEq/L (ref 19–32)
Calcium: 9.8 mg/dL (ref 8.4–10.5)
Chloride: 103 mEq/L (ref 96–112)
Creat: 1.07 mg/dL (ref 0.50–1.10)
Glucose, Bld: 85 mg/dL (ref 70–99)
Potassium: 4.3 mEq/L (ref 3.5–5.3)
Sodium: 137 mEq/L (ref 135–145)
Total Bilirubin: 0.5 mg/dL (ref 0.2–1.2)
Total Protein: 6.9 g/dL (ref 6.0–8.3)

## 2014-09-19 ENCOUNTER — Telehealth: Payer: Self-pay | Admitting: Cardiology

## 2014-09-19 NOTE — Telephone Encounter (Signed)
Pt called and stated that she has not set up home monitor yet and she will be unable to do before her remote transmission and is requesting to come into office to have device checked. Pt agreed to appt on 4-28 at 4:30 PM

## 2014-09-26 ENCOUNTER — Ambulatory Visit (INDEPENDENT_AMBULATORY_CARE_PROVIDER_SITE_OTHER): Payer: BC Managed Care – PPO | Admitting: *Deleted

## 2014-09-26 ENCOUNTER — Encounter: Payer: Self-pay | Admitting: Internal Medicine

## 2014-09-26 DIAGNOSIS — I429 Cardiomyopathy, unspecified: Secondary | ICD-10-CM | POA: Diagnosis not present

## 2014-09-26 DIAGNOSIS — I428 Other cardiomyopathies: Secondary | ICD-10-CM

## 2014-09-26 DIAGNOSIS — Z95810 Presence of automatic (implantable) cardiac defibrillator: Secondary | ICD-10-CM

## 2014-09-26 DIAGNOSIS — I5022 Chronic systolic (congestive) heart failure: Secondary | ICD-10-CM | POA: Diagnosis not present

## 2014-09-26 LAB — MDC_IDC_ENUM_SESS_TYPE_INCLINIC
Battery Remaining Longevity: 51 mo
Battery Voltage: 2.98 V
Brady Statistic AP VP Percent: 0.04 %
Brady Statistic AP VS Percent: 0.04 %
Brady Statistic AS VP Percent: 98.54 %
Brady Statistic AS VS Percent: 1.38 %
Brady Statistic RA Percent Paced: 0.08 %
Brady Statistic RV Percent Paced: 6.79 %
Date Time Interrogation Session: 20160428170911
HighPow Impedance: 57 Ohm
HighPow Impedance: 72 Ohm
Lead Channel Impedance Value: 1007 Ohm
Lead Channel Impedance Value: 361 Ohm
Lead Channel Impedance Value: 513 Ohm
Lead Channel Impedance Value: 551 Ohm
Lead Channel Impedance Value: 589 Ohm
Lead Channel Pacing Threshold Amplitude: 0.5 V
Lead Channel Pacing Threshold Amplitude: 0.625 V
Lead Channel Pacing Threshold Amplitude: 3 V
Lead Channel Pacing Threshold Pulse Width: 0.4 ms
Lead Channel Pacing Threshold Pulse Width: 0.4 ms
Lead Channel Pacing Threshold Pulse Width: 0.8 ms
Lead Channel Sensing Intrinsic Amplitude: 14 mV
Lead Channel Sensing Intrinsic Amplitude: 15.5 mV
Lead Channel Sensing Intrinsic Amplitude: 3.125 mV
Lead Channel Sensing Intrinsic Amplitude: 3.625 mV
Lead Channel Setting Pacing Amplitude: 2 V
Lead Channel Setting Pacing Amplitude: 2.5 V
Lead Channel Setting Pacing Amplitude: 3.25 V
Lead Channel Setting Pacing Pulse Width: 0.4 ms
Lead Channel Setting Pacing Pulse Width: 0.8 ms
Lead Channel Setting Sensing Sensitivity: 0.3 mV
Zone Setting Detection Interval: 250 ms
Zone Setting Detection Interval: 300 ms
Zone Setting Detection Interval: 350 ms
Zone Setting Detection Interval: 450 ms

## 2014-09-26 NOTE — Progress Notes (Signed)
CRT-D device check in office. Thresholds and sensing consistent with previous device measurements. Lead impedance trends stable over time. No mode switch episodes recorded. 1 NSVT---26 beats. Patient bi-ventricularly pacing 98.1% of the time. Device programmed with appropriate safety margins. Heart failure diagnostics reviewed and trends are stable for patient. Audible alerts demonstrated for patient, py knows to call clinic if heard. No changes made this session. Estimated longevity 4.24yrs. Carelink 12/26/14 & ROV w/ SK in 70mo.

## 2014-10-12 ENCOUNTER — Other Ambulatory Visit: Payer: Self-pay | Admitting: Internal Medicine

## 2014-10-14 ENCOUNTER — Other Ambulatory Visit (HOSPITAL_COMMUNITY): Payer: Self-pay | Admitting: *Deleted

## 2014-12-14 ENCOUNTER — Other Ambulatory Visit: Payer: Self-pay | Admitting: Internal Medicine

## 2014-12-26 ENCOUNTER — Telehealth: Payer: Self-pay | Admitting: Internal Medicine

## 2014-12-26 NOTE — Telephone Encounter (Signed)
New Message    4. Are you calling to see if we received your device transmission? Pt has not sent the signal because she is not sure how too send it//sr

## 2014-12-26 NOTE — Telephone Encounter (Signed)
Making sure that this is re-routed to Device

## 2014-12-27 NOTE — Telephone Encounter (Signed)
FU  Pt returning phone call 

## 2014-12-27 NOTE — Telephone Encounter (Signed)
Verbal instructions given on how to set up the Wire X and how to send a manual transmission. Patient voiced understanding of instructions given.

## 2014-12-27 NOTE — Telephone Encounter (Signed)
LMTCB//sss 

## 2014-12-30 NOTE — Telephone Encounter (Signed)
Remote was not received.   LMTCB/sss

## 2014-12-31 ENCOUNTER — Telehealth: Payer: Self-pay | Admitting: Internal Medicine

## 2014-12-31 NOTE — Telephone Encounter (Signed)
New message       Returning Cynthia Roth's call

## 2014-12-31 NOTE — Telephone Encounter (Signed)
Follow up     Pt returning Shakila's call

## 2015-01-01 NOTE — Telephone Encounter (Signed)
Remote was not received. 800# given.

## 2015-01-06 NOTE — Telephone Encounter (Signed)
Remote has not been received. LMTCB/sss

## 2015-01-08 ENCOUNTER — Encounter: Payer: Self-pay | Admitting: *Deleted

## 2015-01-14 ENCOUNTER — Ambulatory Visit (INDEPENDENT_AMBULATORY_CARE_PROVIDER_SITE_OTHER): Payer: BC Managed Care – PPO | Admitting: *Deleted

## 2015-01-14 ENCOUNTER — Encounter: Payer: Self-pay | Admitting: Internal Medicine

## 2015-01-14 DIAGNOSIS — I5022 Chronic systolic (congestive) heart failure: Secondary | ICD-10-CM

## 2015-01-14 DIAGNOSIS — I429 Cardiomyopathy, unspecified: Secondary | ICD-10-CM | POA: Diagnosis not present

## 2015-01-14 DIAGNOSIS — I428 Other cardiomyopathies: Secondary | ICD-10-CM

## 2015-01-14 NOTE — Progress Notes (Signed)
Remote ICD transmission.   

## 2015-01-21 LAB — CUP PACEART REMOTE DEVICE CHECK
Battery Remaining Longevity: 44 mo
Battery Voltage: 2.97 V
Brady Statistic AP VP Percent: 0.02 %
Brady Statistic AP VS Percent: 0.05 %
Brady Statistic AS VP Percent: 98.64 %
Brady Statistic AS VS Percent: 1.29 %
Brady Statistic RA Percent Paced: 0.07 %
Brady Statistic RV Percent Paced: 5.94 %
Date Time Interrogation Session: 20160816153700
HighPow Impedance: 84 Ohm
Lead Channel Impedance Value: 361 Ohm
Lead Channel Impedance Value: 475 Ohm
Lead Channel Impedance Value: 475 Ohm
Lead Channel Impedance Value: 551 Ohm
Lead Channel Impedance Value: 551 Ohm
Lead Channel Impedance Value: 836 Ohm
Lead Channel Pacing Threshold Amplitude: 0.5 V
Lead Channel Pacing Threshold Amplitude: 0.5 V
Lead Channel Pacing Threshold Amplitude: 2.75 V
Lead Channel Pacing Threshold Pulse Width: 0.4 ms
Lead Channel Pacing Threshold Pulse Width: 0.4 ms
Lead Channel Pacing Threshold Pulse Width: 0.8 ms
Lead Channel Sensing Intrinsic Amplitude: 15.875 mV
Lead Channel Sensing Intrinsic Amplitude: 15.875 mV
Lead Channel Sensing Intrinsic Amplitude: 5.125 mV
Lead Channel Sensing Intrinsic Amplitude: 5.125 mV
Lead Channel Setting Pacing Amplitude: 2 V
Lead Channel Setting Pacing Amplitude: 2.5 V
Lead Channel Setting Pacing Amplitude: 3.25 V
Lead Channel Setting Pacing Pulse Width: 0.4 ms
Lead Channel Setting Pacing Pulse Width: 0.8 ms
Lead Channel Setting Sensing Sensitivity: 0.3 mV
Zone Setting Detection Interval: 250 ms
Zone Setting Detection Interval: 300 ms
Zone Setting Detection Interval: 350 ms
Zone Setting Detection Interval: 450 ms

## 2015-02-10 ENCOUNTER — Other Ambulatory Visit: Payer: Self-pay | Admitting: Internal Medicine

## 2015-02-17 ENCOUNTER — Encounter: Payer: Self-pay | Admitting: Internal Medicine

## 2015-02-17 ENCOUNTER — Ambulatory Visit (INDEPENDENT_AMBULATORY_CARE_PROVIDER_SITE_OTHER): Payer: BC Managed Care – PPO | Admitting: *Deleted

## 2015-02-17 DIAGNOSIS — I429 Cardiomyopathy, unspecified: Secondary | ICD-10-CM

## 2015-02-17 DIAGNOSIS — I5022 Chronic systolic (congestive) heart failure: Secondary | ICD-10-CM

## 2015-02-17 DIAGNOSIS — I447 Left bundle-branch block, unspecified: Secondary | ICD-10-CM

## 2015-02-17 DIAGNOSIS — I428 Other cardiomyopathies: Secondary | ICD-10-CM

## 2015-02-17 LAB — CUP PACEART INCLINIC DEVICE CHECK
Battery Remaining Longevity: 45 mo
Battery Voltage: 2.94 V
Brady Statistic AP VP Percent: 0.02 %
Brady Statistic AP VS Percent: 0.05 %
Brady Statistic AS VP Percent: 98.63 %
Brady Statistic AS VS Percent: 1.3 %
Brady Statistic RA Percent Paced: 0.07 %
Brady Statistic RV Percent Paced: 5.98 %
Date Time Interrogation Session: 20160919133333
HighPow Impedance: 57 Ohm
HighPow Impedance: 72 Ohm
Lead Channel Impedance Value: 361 Ohm
Lead Channel Impedance Value: 513 Ohm
Lead Channel Impedance Value: 589 Ohm
Lead Channel Impedance Value: 589 Ohm
Lead Channel Impedance Value: 950 Ohm
Lead Channel Pacing Threshold Amplitude: 0.375 V
Lead Channel Pacing Threshold Amplitude: 0.5 V
Lead Channel Pacing Threshold Amplitude: 2.75 V
Lead Channel Pacing Threshold Pulse Width: 0.4 ms
Lead Channel Pacing Threshold Pulse Width: 0.4 ms
Lead Channel Pacing Threshold Pulse Width: 0.8 ms
Lead Channel Sensing Intrinsic Amplitude: 11.75 mV
Lead Channel Sensing Intrinsic Amplitude: 13.75 mV
Lead Channel Sensing Intrinsic Amplitude: 4 mV
Lead Channel Sensing Intrinsic Amplitude: 4.25 mV
Lead Channel Setting Pacing Amplitude: 2 V
Lead Channel Setting Pacing Amplitude: 2.5 V
Lead Channel Setting Pacing Amplitude: 3.25 V
Lead Channel Setting Pacing Pulse Width: 0.4 ms
Lead Channel Setting Pacing Pulse Width: 0.8 ms
Lead Channel Setting Sensing Sensitivity: 0.45 mV
Zone Setting Detection Interval: 250 ms
Zone Setting Detection Interval: 300 ms
Zone Setting Detection Interval: 350 ms
Zone Setting Detection Interval: 450 ms

## 2015-02-17 NOTE — Progress Notes (Signed)
CRT-D device check in office to reprogram for TWOS (n/c). Thresholds and sensing consistent with previous device measurements. Lead impedance trends stable over time. No mode switch episodes recorded. No ventricular arrhythmia episodes recorded. Patient bi-ventricularly pacing 99.3% of the time with 1.3% as VSRp. Device programmed with appropriate safety margins. Heart failure diagnostics reviewed and trends are stable for patient. Audible alerts demonstrated for patient. RV sensitivity decreased from 0.82mV to 0.74mV due to T wave oversensing seen on last remote. Estimated longevity 3.7 years. Patient will follow up as scheduled.

## 2015-04-17 ENCOUNTER — Ambulatory Visit (INDEPENDENT_AMBULATORY_CARE_PROVIDER_SITE_OTHER): Payer: BC Managed Care – PPO | Admitting: *Deleted

## 2015-04-17 DIAGNOSIS — I5022 Chronic systolic (congestive) heart failure: Secondary | ICD-10-CM | POA: Diagnosis not present

## 2015-04-17 DIAGNOSIS — I429 Cardiomyopathy, unspecified: Secondary | ICD-10-CM | POA: Diagnosis not present

## 2015-04-17 DIAGNOSIS — I428 Other cardiomyopathies: Secondary | ICD-10-CM

## 2015-04-17 NOTE — Progress Notes (Signed)
Remote ICD transmission.   

## 2015-04-18 ENCOUNTER — Other Ambulatory Visit: Payer: Self-pay | Admitting: Internal Medicine

## 2015-04-23 LAB — CUP PACEART REMOTE DEVICE CHECK
Battery Remaining Longevity: 44 mo
Battery Voltage: 2.96 V
Brady Statistic AP VP Percent: 0.01 %
Brady Statistic AP VS Percent: 0.05 %
Brady Statistic AS VP Percent: 98.65 %
Brady Statistic AS VS Percent: 1.28 %
Brady Statistic RA Percent Paced: 0.06 %
Brady Statistic RV Percent Paced: 4.17 %
Date Time Interrogation Session: 20161117072609
HighPow Impedance: 77 Ohm
Implantable Lead Implant Date: 20131211
Implantable Lead Implant Date: 20131211
Implantable Lead Implant Date: 20131211
Implantable Lead Location: 753858
Implantable Lead Location: 753859
Implantable Lead Location: 753860
Implantable Lead Model: 181
Implantable Lead Model: 4396
Implantable Lead Model: 5076
Implantable Lead Serial Number: 32342
Lead Channel Impedance Value: 399 Ohm
Lead Channel Impedance Value: 513 Ohm
Lead Channel Impedance Value: 589 Ohm
Lead Channel Impedance Value: 608 Ohm
Lead Channel Impedance Value: 608 Ohm
Lead Channel Impedance Value: 950 Ohm
Lead Channel Pacing Threshold Amplitude: 0.5 V
Lead Channel Pacing Threshold Amplitude: 0.625 V
Lead Channel Pacing Threshold Amplitude: 3 V
Lead Channel Pacing Threshold Pulse Width: 0.4 ms
Lead Channel Pacing Threshold Pulse Width: 0.4 ms
Lead Channel Pacing Threshold Pulse Width: 0.8 ms
Lead Channel Sensing Intrinsic Amplitude: 12.5 mV
Lead Channel Sensing Intrinsic Amplitude: 12.5 mV
Lead Channel Sensing Intrinsic Amplitude: 3 mV
Lead Channel Sensing Intrinsic Amplitude: 3 mV
Lead Channel Setting Pacing Amplitude: 2 V
Lead Channel Setting Pacing Amplitude: 3.25 V
Lead Channel Setting Pacing Pulse Width: 0.8 ms
Lead Channel Setting Sensing Sensitivity: 0.45 mV

## 2015-04-25 ENCOUNTER — Telehealth: Payer: Self-pay | Admitting: Nurse Practitioner

## 2015-04-25 NOTE — Telephone Encounter (Signed)
Error

## 2015-04-25 NOTE — Telephone Encounter (Signed)
Tried to call patient back but she is not accepting voicemail and she did not answer. BiDil is out of stock. I called [facility] where she gets her Bidil. Will split dose her medicine - Isordil 20 mg TID and Hydralazine 37.5 mg TID. Gave one month supply.   Pharmacy says they will contact the patient.   Burtis Junes, RN, [place] [address] [place], [place]  [address] [phone]

## 2015-04-29 ENCOUNTER — Other Ambulatory Visit: Payer: Self-pay

## 2015-04-29 DIAGNOSIS — Z1231 Encounter for screening mammogram for malignant neoplasm of breast: Secondary | ICD-10-CM

## 2015-05-14 ENCOUNTER — Encounter: Payer: Self-pay | Admitting: Cardiology

## 2015-05-20 ENCOUNTER — Ambulatory Visit
Admission: RE | Admit: 2015-05-20 | Discharge: 2015-05-20 | Disposition: A | Discharge status: Home / Self Care | Payer: BC Managed Care – PPO | Source: Ambulatory Visit

## 2015-05-20 DIAGNOSIS — Z1231 Encounter for screening mammogram for malignant neoplasm of breast: Secondary | ICD-10-CM

## 2015-06-12 ENCOUNTER — Other Ambulatory Visit: Payer: Self-pay | Admitting: Cardiology

## 2015-06-13 ENCOUNTER — Ambulatory Visit (INDEPENDENT_AMBULATORY_CARE_PROVIDER_SITE_OTHER): Payer: BC Managed Care – PPO | Admitting: Cardiology

## 2015-06-13 ENCOUNTER — Encounter: Payer: Self-pay | Admitting: Cardiology

## 2015-06-13 VITALS — BP 110/80 | HR 80 | Ht 64.5 in | Wt 196.0 lb

## 2015-06-13 DIAGNOSIS — I428 Other cardiomyopathies: Secondary | ICD-10-CM

## 2015-06-13 DIAGNOSIS — Z95810 Presence of automatic (implantable) cardiac defibrillator: Secondary | ICD-10-CM

## 2015-06-13 DIAGNOSIS — I447 Left bundle-branch block, unspecified: Secondary | ICD-10-CM

## 2015-06-13 DIAGNOSIS — I5022 Chronic systolic (congestive) heart failure: Secondary | ICD-10-CM | POA: Diagnosis not present

## 2015-06-13 DIAGNOSIS — I429 Cardiomyopathy, unspecified: Secondary | ICD-10-CM | POA: Diagnosis not present

## 2015-06-13 DIAGNOSIS — Z4502 Encounter for adjustment and management of automatic implantable cardiac defibrillator: Secondary | ICD-10-CM

## 2015-06-13 MED ORDER — BENZONATATE 100 MG PO CAPS: 100.0000 mg | ORAL_CAPSULE | Freq: Three times a day (TID) | ORAL | Status: DC | PRN

## 2015-06-13 MED ORDER — FUROSEMIDE 20 MG PO TABS: 20.0000 mg | ORAL_TABLET | ORAL | Status: DC

## 2015-06-13 NOTE — Patient Instructions (Addendum)
Medication Instructions:  Your physician recommends that you continue on your current medications as directed. Please refer to the Current Medication list given to you today. 1.  START the Tessalon Perls 100 mg taking 1 every 8 AS NEEDED FOR COUGH  Labwork: 1-2 WEEKS - Your physician recommends that you return for a FASTING lipid profile, CBC W/DIFF, CMP   Testing/Procedures: Your physician has requested that you have an echocardiogram. Echocardiography is a painless test that uses sound waves to create images of your heart. It provides your doctor with information about the size and shape of your heart and how well your heart's chambers and valves are working. This procedure takes approximately one hour. There are no restrictions for this procedure.   Follow-Up: Your physician recommends that you schedule a follow-up appointment in:  February OR March 2017 with Dr. Caryl Comes for ICD Your physician wants you to follow-up in: [place] [name].  You will receive a reminder letter in the mail two months in advance. If you don't receive a letter, please call our office to schedule the follow-up appointment.    Any Other Special Instructions Will Be Listed Below (If Applicable).   Weigh daily Call [phone] if weight climbs more than 3 pounds in a day or 5 pounds in a week. No salt to very little salt in your diet.  No more than 2000 mg in a day. Call if increased shortness of breath or increased swelling.    You may use clarinex or mucinex for congestion.   You have been referred to [place].  THAT OFFICE WILL CONTACT YOU TO SET UP AN APPOINTMENT.  If you need a refill on your cardiac medications before your next appointment, please call your pharmacy.

## 2015-06-13 NOTE — Progress Notes (Signed)
Cardiology Office Note   Date:  06/13/2015   ID:  Cesar, Musacchia [birth year], MRN [id number]  PCP:  Maximino Greenland, MD  Cardiologist:  Dr. [name]  EP Dr. Caryl Comes   GI Dr. Collene Mares  Chief Complaint  Patient presents with  . Congestive Heart Failure    sytolic       History of Present Illness: Cynthia Roth is a 65 y.o. female who presents for ICM with chronic systolic HF.    She has a hx of CHF with CRD-D for NICM 12/13.  Hx of LBBB, EF 15% and cardiac cath with no CAD.  Most recent EF 2014 with EF 20%.   Cardiopulmonary stress test in 3/15 with mildly reduced functional status.    Today she is doing well and denies any SOB or chest pain.  She is taking her meds without problems. She would like to go to rehab or some type of program- will see if we can get her into rehab-cardiac.  She is working full times.  She watches her salt but not so much exercise.  Today she has a dry cough and post nasal drip.  No fever.      Past Medical History  Diagnosis Date  . GERD (gastroesophageal reflux disease)   . Hx of colonic polyps   . Tubular adenoma 1999    history of  . Nonischemic cardiomyopathy ([place])   . Chronic systolic heart failure ([place]) 12/02/2011  . LBBB (left bundle branch block) 12/02/2011  . Dyslipidemia 12/02/2011  . Hypertension     "used to have this; now my BP is low" (05/10/2012)  . Gout     "very seldom" (05/10/2012)  . Biventricular ICD -Medtronic     DOI 12/13 - Medtronic Viva XR CRT-D defibrillator, serial #[id number] [id number].    . Allergy     Past Surgical History  Procedure Laterality Date  . Cardiac catheterization  12-03-11    selectie coronary angiography  . Cardiac defibrillator placement  05/10/2012    CRT-D implantation (05/10/2012)  . Partial hysterectomy  1980's  . Left heart catheterization with coronary angiogram N/A 12/03/2011    Procedure: LEFT HEART CATHETERIZATION WITH CORONARY ANGIOGRAM;  Surgeon: Larey Dresser, MD;  Location: [facility] CATH LAB;   Service: Cardiovascular;  Laterality: N/A;  . Bi-ventricular implantable cardioverter defibrillator N/A 05/10/2012    Procedure: BI-VENTRICULAR IMPLANTABLE CARDIOVERTER DEFIBRILLATOR  (CRT-D);  Surgeon: Deboraha Sprang, MD;  Location: [facility] CATH LAB;  Service: Cardiovascular;  Laterality: N/A;     Current Outpatient Prescriptions  Medication Sig Dispense Refill  . aspirin 81 MG tablet Take 81 mg by mouth daily.    . B COMPLEX VITAMINS SL Place 1 drop under the tongue daily.    . carvedilol (COREG) 12.5 MG tablet TAKE 1 & 1/2 TABLETS BY MOUTH TWICE A DAY 90 tablet 5  . Cholecalciferol (VITAMIN D3) 2000 units TABS Take 1 tablet by mouth daily.    [name] CINNAMON PO Take 2,000 mg by mouth daily.     Mariane Baumgarten Calcium (STOOL SOFTENER PO) Take 2 capsules by mouth daily.    . furosemide (LASIX) 20 MG tablet Take 1 tablet (20 mg total) by mouth every other day. 15 tablet 6  . isosorbide-hydrALAZINE (BIDIL) 20-37.5 MG per tablet TAKE 1 TABLET BY MOUTH 3 (THREE) TIMES DAILY 90 tablet 6  . lisinopril (PRINIVIL,ZESTRIL) 20 MG tablet TAKE 1 TABLET BY MOUTH TWICE A DAY 60 tablet 3  . metaxalone (SKELAXIN) 800  MG tablet Take 800 mg by mouth 3 (three) times daily as needed for muscle spasms.    . Multiple Vitamins-Minerals (CENTRUM SILVER PO) Take by mouth daily.    [name] spironolactone (ALDACTONE) 25 MG tablet TAKE 1 TABLET EVERY DAY 30 tablet 3  . benzonatate (TESSALON PERLES) 100 MG capsule Take 1 capsule (100 mg total) by mouth every 8 (eight) hours as needed for cough. 30 capsule 0   No current facility-administered medications for this visit.    Allergies:   Decongestant    Social History:  The patient  reports that she has never smoked. She has never used smokeless tobacco. She reports that she does not drink alcohol or use illicit drugs.   Family History:  The patient's family history includes Angina in her mother; Diabetes in her brother; Heart attack in her maternal grandmother; Heart failure in her  maternal grandmother; Kidney disease in her father; Memory loss in her mother; Sleep apnea in her brother; Stroke in her maternal grandmother. There is no history of Hypertension.    ROS:  General:no colds or fevers, + weight loss has been cutting back on food intake. Skin:no rashes or ulcers HEENT:no blurred vision, no congestion CV:see HPI PUL:see HPI GI:no diarrhea constipation or melena, no indigestion GU:no hematuria, no dysuria MS:no joint pain, no claudication Neuro:no syncope, no lightheadedness Endo:no diabetes, no thyroid disease  Wt Readings from Last 3 Encounters:  06/13/15 196 lb (88.905 kg)  08/12/14 218 lb 9.6 oz (99.156 kg)  06/25/14 224 lb 6.4 oz (101.787 kg)     PHYSICAL EXAM: VS:  BP 110/80 mmHg  Pulse 80  Ht 5' 4.5" (1.638 m)  Wt 196 lb (88.905 kg)  BMI 33.14 kg/m2 , BMI Body mass index is 33.14 kg/(m^2). General:Pleasant affect, NAD Skin:Warm and dry, brisk capillary refill HEENT:normocephalic, sclera clear, mucus membranes moist Neck:supple, no JVD, no bruits  Heart:S1S2 RRR without murmur, gallup, rub or click Lungs:clear without rales, rhonchi, or wheezes [id number], non tender, + BS, do not palpate liver spleen or masses Ext:no lower ext edema, 2+ pedal pulses, 2+ radial pulses Neuro:alert and oriented X 3, MAE, follows commands, + facial symmetry  Her device has not shocked her.    EKG:  EKG is ordered today. The ekg ordered today demonstrates SR with BiV pacing. No changes.   Recent Labs: 07/24/2014: B Natriuretic Peptide 9.0; Platelets 233 08/12/2014: ALT 22; BUN 18; Creat 1.07; Hemoglobin 10.1*; Potassium 4.3; Sodium 137    Lipid Panel    Component Value Date/Time   CHOL 168 12/01/2011 0500   TRIG 73 12/01/2011 0500   HDL 28* 12/01/2011 0500   CHOLHDL 6.0 12/01/2011 0500   VLDL 15 12/01/2011 0500   LDLCALC 125* 12/01/2011 0500       Other studies Reviewed: Additional studies/ records that were reviewed today include: previous  echo and OV notes..   ASSESSMENT AND PLAN:  1.  NICM with last EF 2014 of 20%  Currently euvolemic.  Would like to do echo- to recheck her EF.  Otherwise check labs incluking lipids.  Have her follow up with Dr. [name] in 6 months.  2. Chronic systolic HF no symptoms currently.  3. Essential HTN controlled  4. CRT-D medtronic to follow up with Dr. Caryl Comes in [place] or march.   Current medicines are reviewed with the patient today.  The patient Has no concerns regarding medicines.  The following changes have been made:  See above Labs/ tests ordered today include:see above  Disposition:  FU:  see above  Lennie Muckle, NP  06/13/2015 4:16 PM    [facility] [place], [address] [place], [place] Phone: [phone]; Fax: [phone]

## 2015-06-14 ENCOUNTER — Other Ambulatory Visit: Payer: Self-pay | Admitting: Internal Medicine

## 2015-06-16 ENCOUNTER — Other Ambulatory Visit (INDEPENDENT_AMBULATORY_CARE_PROVIDER_SITE_OTHER): Payer: BC Managed Care – PPO | Admitting: *Deleted

## 2015-06-16 DIAGNOSIS — I429 Cardiomyopathy, unspecified: Secondary | ICD-10-CM

## 2015-06-16 DIAGNOSIS — I428 Other cardiomyopathies: Secondary | ICD-10-CM

## 2015-06-16 LAB — CBC WITH DIFFERENTIAL/PLATELET
Basophils Absolute: 0 10*3/uL (ref 0.0–0.1)
Basophils Relative: 0 % (ref 0–1)
Eosinophils Absolute: 0.1 10*3/uL (ref 0.0–0.7)
Eosinophils Relative: 3 % (ref 0–5)
HCT: 28.9 % — ABNORMAL LOW (ref 36.0–46.0)
Hemoglobin: 9.6 g/dL — ABNORMAL LOW (ref 12.0–15.0)
Lymphocytes Relative: 34 % (ref 12–46)
Lymphs Abs: 1.6 10*3/uL (ref 0.7–4.0)
MCH: 31 pg (ref 26.0–34.0)
MCHC: 33.2 g/dL (ref 30.0–36.0)
MCV: 93.2 fL (ref 78.0–100.0)
MPV: 8.2 fL — ABNORMAL LOW (ref 8.6–12.4)
Monocytes Absolute: 0.4 10*3/uL (ref 0.1–1.0)
Monocytes Relative: 8 % (ref 3–12)
Neutro Abs: 2.6 10*3/uL (ref 1.7–7.7)
Neutrophils Relative %: 55 % (ref 43–77)
Platelets: 267 10*3/uL (ref 150–400)
RBC: 3.1 MIL/uL — ABNORMAL LOW (ref 3.87–5.11)
RDW: 13.2 % (ref 11.5–15.5)
WBC: 4.7 10*3/uL (ref 4.0–10.5)

## 2015-06-16 LAB — LIPID PANEL
Cholesterol: 182 mg/dL (ref 125–200)
HDL: 32 mg/dL — ABNORMAL LOW (ref >=46)
LDL Cholesterol: 131 mg/dL — ABNORMAL HIGH (ref <130)
Total CHOL/HDL Ratio: 5.7 Ratio — ABNORMAL HIGH (ref <=5.0)
Triglycerides: 94 mg/dL (ref <150)
VLDL: 19 mg/dL (ref <30)

## 2015-06-16 LAB — COMPREHENSIVE METABOLIC PANEL
ALT: 10 U/L (ref 6–29)
AST: 15 U/L (ref 10–35)
Albumin: 4 g/dL (ref 3.6–5.1)
Alkaline Phosphatase: 55 U/L (ref 33–130)
BUN: 24 mg/dL (ref 7–25)
CO2: 25 mmol/L (ref 20–31)
Calcium: 9.5 mg/dL (ref 8.6–10.4)
Chloride: 106 mmol/L (ref 98–110)
Creat: 1.38 mg/dL — ABNORMAL HIGH (ref 0.50–0.99)
Glucose, Bld: 93 mg/dL (ref 65–99)
Potassium: 4.3 mmol/L (ref 3.5–5.3)
Sodium: 141 mmol/L (ref 135–146)
Total Bilirubin: 0.2 mg/dL (ref 0.2–1.2)
Total Protein: 7.1 g/dL (ref 6.1–8.1)

## 2015-06-17 ENCOUNTER — Telehealth: Payer: Self-pay | Admitting: *Deleted

## 2015-06-17 ENCOUNTER — Other Ambulatory Visit: Payer: BC Managed Care – PPO

## 2015-06-17 DIAGNOSIS — E785 Hyperlipidemia, unspecified: Secondary | ICD-10-CM

## 2015-06-17 MED ORDER — ATORVASTATIN CALCIUM 20 MG PO TABS
20.0000 mg | ORAL_TABLET | Freq: Every day | ORAL | Status: DC
Start: 2015-06-17 — End: 2016-06-06

## 2015-06-17 NOTE — Telephone Encounter (Signed)
-----   Message from Isaiah Serge, NP sent at 06/17/2015  7:50 AM EST ----- Have pt begin lipitor 20 mg daily, recheck lipids in 2-3 months.  Otherwise labs stable.

## 2015-06-17 NOTE — Telephone Encounter (Signed)
Per Cecilie Kicks, NP, called pt to inform her that overall cholesterol is high and we would like to start her on Lipitor 20 mg taking 1 tablet daily and have her lipids rechecked in 2-3 months.  Pt verbalized understanding.  Order for Labs has been put into EPIC and [facility] [place].

## 2015-06-30 ENCOUNTER — Ambulatory Visit (HOSPITAL_COMMUNITY): Payer: BC Managed Care – PPO | Attending: Internal Medicine

## 2015-06-30 ENCOUNTER — Other Ambulatory Visit: Payer: Self-pay

## 2015-06-30 DIAGNOSIS — I10 Essential (primary) hypertension: Secondary | ICD-10-CM | POA: Insufficient documentation

## 2015-06-30 DIAGNOSIS — I340 Nonrheumatic mitral (valve) insufficiency: Secondary | ICD-10-CM | POA: Insufficient documentation

## 2015-06-30 DIAGNOSIS — I255 Ischemic cardiomyopathy: Secondary | ICD-10-CM | POA: Insufficient documentation

## 2015-06-30 DIAGNOSIS — I071 Rheumatic tricuspid insufficiency: Secondary | ICD-10-CM | POA: Diagnosis not present

## 2015-06-30 DIAGNOSIS — I428 Other cardiomyopathies: Secondary | ICD-10-CM

## 2015-06-30 DIAGNOSIS — I429 Cardiomyopathy, unspecified: Secondary | ICD-10-CM | POA: Insufficient documentation

## 2015-07-18 ENCOUNTER — Other Ambulatory Visit: Payer: Self-pay | Admitting: Internal Medicine

## 2015-07-21 ENCOUNTER — Other Ambulatory Visit: Payer: Self-pay | Admitting: *Deleted

## 2015-07-21 ENCOUNTER — Other Ambulatory Visit: Payer: Self-pay | Admitting: Internal Medicine

## 2015-07-21 MED ORDER — ISOSORB DINITRATE-HYDRALAZINE 20-37.5 MG PO TABS: ORAL_TABLET | ORAL | Status: DC

## 2015-08-14 ENCOUNTER — Other Ambulatory Visit: Payer: Self-pay | Admitting: Internal Medicine

## 2015-08-18 ENCOUNTER — Encounter: Payer: Self-pay | Admitting: Internal Medicine

## 2015-08-18 ENCOUNTER — Ambulatory Visit (INDEPENDENT_AMBULATORY_CARE_PROVIDER_SITE_OTHER): Payer: BC Managed Care – PPO | Admitting: Internal Medicine

## 2015-08-18 VITALS — BP 79/48 | HR 75 | Ht 64.5 in | Wt 201.0 lb

## 2015-08-18 DIAGNOSIS — Z95810 Presence of automatic (implantable) cardiac defibrillator: Secondary | ICD-10-CM | POA: Diagnosis not present

## 2015-08-18 DIAGNOSIS — I429 Cardiomyopathy, unspecified: Secondary | ICD-10-CM | POA: Diagnosis not present

## 2015-08-18 DIAGNOSIS — I5022 Chronic systolic (congestive) heart failure: Secondary | ICD-10-CM

## 2015-08-18 DIAGNOSIS — I428 Other cardiomyopathies: Secondary | ICD-10-CM

## 2015-08-18 DIAGNOSIS — I447 Left bundle-branch block, unspecified: Secondary | ICD-10-CM

## 2015-08-18 LAB — CBC WITH DIFFERENTIAL/PLATELET
Basophils Absolute: 0 10*3/uL (ref 0.0–0.1)
Basophils Relative: 0 % (ref 0–1)
Eosinophils Absolute: 0.2 10*3/uL (ref 0.0–0.7)
Eosinophils Relative: 4 % (ref 0–5)
HCT: 28.7 % — ABNORMAL LOW (ref 36.0–46.0)
Hemoglobin: 9.4 g/dL — ABNORMAL LOW (ref 12.0–15.0)
Lymphocytes Relative: 35 % (ref 12–46)
Lymphs Abs: 1.8 10*3/uL (ref 0.7–4.0)
MCH: 30.7 pg (ref 26.0–34.0)
MCHC: 32.8 g/dL (ref 30.0–36.0)
MCV: 93.8 fL (ref 78.0–100.0)
MPV: 8.6 fL (ref 8.6–12.4)
Monocytes Absolute: 0.4 10*3/uL (ref 0.1–1.0)
Monocytes Relative: 8 % (ref 3–12)
Neutro Abs: 2.8 10*3/uL (ref 1.7–7.7)
Neutrophils Relative %: 53 % (ref 43–77)
Platelets: 272 10*3/uL (ref 150–400)
RBC: 3.06 MIL/uL — ABNORMAL LOW (ref 3.87–5.11)
RDW: 13.6 % (ref 11.5–15.5)
WBC: 5.2 10*3/uL (ref 4.0–10.5)

## 2015-08-18 LAB — FERRITIN: Ferritin: 68 ng/mL (ref 20–288)

## 2015-08-18 MED ORDER — MAGNESIUM OXIDE 400 MG PO TABS: 400.0000 mg | ORAL_TABLET | Freq: Every day | ORAL | Status: DC

## 2015-08-18 NOTE — Patient Instructions (Signed)
Medication Instructions: 1) Decrease Coreg (carvedilol) to 12.5 mg one tablet by mouth twice daily 2) Decrease Bidil 20/37.5 mg to 1/2 tablet three times daily 3) Start magnesium oxide 400 mg one tablet by mouth once daily  Labwork: - Your physician recommends that you have lab work today: cbc/ferritin  Procedures/Testing: - none  Follow-Up: - Remote monitoring is used to monitor your Pacemaker of ICD from home. This monitoring reduces the number of office visits required to check your device to one time per year. It allows [place] to keep an eye on the functioning of your device to ensure it is working properly. You are scheduled for a device check from home on 11/17/15. You may send your transmission at any time that day. If you have a wireless device, the transmission will be sent automatically. After your physician reviews your transmission, you will receive a postcard with your next transmission date.  - Your physician wants you to follow-up in: 1 year with Dr. Caryl Comes. You will receive a reminder letter in the mail two months in advance. If you don't receive a letter, please call our office to schedule the follow-up appointment.   Any Additional Special Instructions Will Be Listed Below (If Applicable).     If you need a refill on your cardiac medications before your next appointment, please call your pharmacy.

## 2015-08-18 NOTE — Progress Notes (Signed)
Patient Care Team: Glendale Chard, MD as PCP - General (Internal Medicine) Larey Dresser, MD as Consulting Physician (Cardiology) Juanita Craver, MD as Consulting Physician (Gastroenterology)   HPI  Cynthia Roth is a 65 y.o. female Seen in followup for congestive heart failure with CRT-D implantation for nonischemic cardiomyopathy 12/13  She is considerably improved with improved exercise tolerance; no edema or lightheadedness  K ok 1/17  hemoglobin had been drifting down 11.2 --2/16----9.6  1/17.  She has some episodes of hypotension which is associated with some sluggishness.  Past Medical History  Diagnosis Date  . GERD (gastroesophageal reflux disease)   . Hx of colonic polyps   . Tubular adenoma 1999    history of  . Nonischemic cardiomyopathy ([place])   . Chronic systolic heart failure ([place]) 12/02/2011  . LBBB (left bundle branch block) 12/02/2011  . Dyslipidemia 12/02/2011  . Hypertension     "used to have this; now my BP is low" (05/10/2012)  . Gout     "very seldom" (05/10/2012)  . Biventricular ICD -Medtronic     DOI 12/13 - Medtronic Viva XR CRT-D defibrillator, serial #BLF [id number] H.    . Allergy     Past Surgical History  Procedure Laterality Date  . Cardiac catheterization  12-03-11    selectie coronary angiography  . Cardiac defibrillator placement  05/10/2012    CRT-D implantation (05/10/2012)  . Partial hysterectomy  1980's  . Left heart catheterization with coronary angiogram N/A 12/03/2011    Procedure: LEFT HEART CATHETERIZATION WITH CORONARY ANGIOGRAM;  Surgeon: Larey Dresser, MD;  Location: [facility] CATH LAB;  Service: Cardiovascular;  Laterality: N/A;  . Bi-ventricular implantable cardioverter defibrillator N/A 05/10/2012    Procedure: BI-VENTRICULAR IMPLANTABLE CARDIOVERTER DEFIBRILLATOR  (CRT-D);  Surgeon: Deboraha Sprang, MD;  Location: [facility] CATH LAB;  Service: Cardiovascular;  Laterality: N/A;    Current Outpatient Prescriptions  Medication Sig  Dispense Refill  . aspirin 81 MG tablet Take 81 mg by mouth daily.    [name] atorvastatin (LIPITOR) 20 MG tablet Take 1 tablet (20 mg total) by mouth daily. 90 tablet 3  . B COMPLEX VITAMINS SL Place 1 drop under the tongue daily.    . benzonatate (TESSALON PERLES) 100 MG capsule Take 1 capsule (100 mg total) by mouth every 8 (eight) hours as needed for cough. 30 capsule 0  . carvedilol (COREG) 12.5 MG tablet TAKE 1 & 1/2 TABLETS BY MOUTH TWICE A DAY 90 tablet 5  . Cholecalciferol (VITAMIN D3) 2000 units TABS Take 1 tablet by mouth daily.    [name] CINNAMON PO Take 2,000 mg by mouth daily.     Mariane Baumgarten Calcium (STOOL SOFTENER PO) Take 2 capsules by mouth daily.    . furosemide (LASIX) 20 MG tablet Take 1 tablet (20 mg total) by mouth every other day. 15 tablet 6  . isosorbide-hydrALAZINE (BIDIL) 20-37.5 MG tablet TAKE 1 TABLET BY MOUTH 3 (THREE) TIMES DAILY 90 tablet 10  . lisinopril (PRINIVIL,ZESTRIL) 20 MG tablet TAKE 1 TABLET BY MOUTH TWICE A DAY 60 tablet 3  . metaxalone (SKELAXIN) 800 MG tablet Take 800 mg by mouth 3 (three) times daily as needed for muscle spasms.    . Multiple Vitamins-Minerals (CENTRUM SILVER PO) Take by mouth daily.    [name] spironolactone (ALDACTONE) 25 MG tablet TAKE 1 TABLET BY MOUTH EVERY DAY 30 tablet 3   No current facility-administered medications for this visit.    Allergies  Allergen Reactions  .  Decongestant [Pseudoephedrine Hcl Er]     PATIENT CAN'T REMEMBER     Review of Systems negative except from HPI and PMH  Physical Exam Ht 5' 4.5" (1.638 m) Well developed and nourished in no acute distress HENT normal Neck supple with JVP-flat Clear Regular rate and rhythm, no murmurs or gallops Abd-soft with active BS No Clubbing cyanosis edema Skin-warm and dry A & Oriented  Grossly normal sensory and motor function Device pocket well healed; without hematoma or erythema.  There is no tethering   ECG demonstrates sinus rhythm p synchronous pacing with a left  bundle branch block configuration and a QRS duration of 148  Assessment and  Plan  Nonischemic cardiomyopathy  Hypotension  Anemia  Systolic heart failure-chronic  Cramps  Implantable defibrillator-Medtronic-CRT  Euvolemic continue current meds  we will however decrease the dose of carvedilol 18.75--12.5 and BiDil 1 tablet 3 times a day--half tablet 3 times a day. I discussed this with Dr. DM.  I'm also concerned about her anemia. Recheck a CBC.ferritin  On Guideline directed medical therapy for cardiomyopathy  Discussed the need for closer surveillance of K with aldactone  We will have her take mag oxide for cramping; maybe that will be helpful.

## 2015-08-19 LAB — CUP PACEART INCLINIC DEVICE CHECK
Battery Remaining Longevity: 39 mo
Battery Voltage: 2.91 V
Brady Statistic AP VP Percent: 0.01 %
Brady Statistic AP VS Percent: 0.05 %
Brady Statistic AS VP Percent: 98.68 %
Brady Statistic AS VS Percent: 1.27 %
Brady Statistic RA Percent Paced: 0.06 %
Brady Statistic RV Percent Paced: 4.84 %
Date Time Interrogation Session: 20170320143848
HighPow Impedance: 75 Ohm
Implantable Lead Implant Date: 20131211
Implantable Lead Implant Date: 20131211
Implantable Lead Implant Date: 20131211
Implantable Lead Location: 753858
Implantable Lead Location: 753859
Implantable Lead Location: 753860
Implantable Lead Model: 181
Implantable Lead Model: 4396
Implantable Lead Model: 5076
Implantable Lead Serial Number: 32342
Lead Channel Impedance Value: 1007 Ohm
Lead Channel Impedance Value: 399 Ohm
Lead Channel Impedance Value: 513 Ohm
Lead Channel Impedance Value: 589 Ohm
Lead Channel Impedance Value: 589 Ohm
Lead Channel Impedance Value: 608 Ohm
Lead Channel Pacing Threshold Amplitude: 0.5 V
Lead Channel Pacing Threshold Amplitude: 0.5 V
Lead Channel Pacing Threshold Amplitude: 2.75 V
Lead Channel Pacing Threshold Pulse Width: 0.4 ms
Lead Channel Pacing Threshold Pulse Width: 0.4 ms
Lead Channel Pacing Threshold Pulse Width: 0.8 ms
Lead Channel Sensing Intrinsic Amplitude: 11.375 mV
Lead Channel Sensing Intrinsic Amplitude: 11.5 mV
Lead Channel Sensing Intrinsic Amplitude: 3.375 mV
Lead Channel Sensing Intrinsic Amplitude: 3.625 mV
Lead Channel Setting Pacing Amplitude: 2 V
Lead Channel Setting Pacing Amplitude: 3.25 V
Lead Channel Setting Pacing Pulse Width: 0.8 ms
Lead Channel Setting Sensing Sensitivity: 0.45 mV

## 2015-08-28 ENCOUNTER — Other Ambulatory Visit (HOSPITAL_COMMUNITY): Payer: Self-pay | Admitting: Pharmacist

## 2015-08-28 MED ORDER — LISINOPRIL 40 MG PO TABS: 20.0000 mg | ORAL_TABLET | Freq: Two times a day (BID) | ORAL | Status: DC

## 2015-11-17 ENCOUNTER — Ambulatory Visit (INDEPENDENT_AMBULATORY_CARE_PROVIDER_SITE_OTHER): Payer: BC Managed Care – PPO | Admitting: *Deleted

## 2015-11-17 DIAGNOSIS — I429 Cardiomyopathy, unspecified: Secondary | ICD-10-CM | POA: Diagnosis not present

## 2015-11-17 DIAGNOSIS — I428 Other cardiomyopathies: Secondary | ICD-10-CM

## 2015-11-17 NOTE — Progress Notes (Signed)
Remote ICD transmission.   

## 2015-11-18 LAB — CUP PACEART REMOTE DEVICE CHECK
Battery Remaining Longevity: 32 mo
Battery Voltage: 2.9 V
Brady Statistic AP VP Percent: 0.01 %
Brady Statistic AP VS Percent: 0.05 %
Brady Statistic AS VP Percent: 98.65 %
Brady Statistic AS VS Percent: 1.3 %
Brady Statistic RA Percent Paced: 0.06 %
Brady Statistic RV Percent Paced: 5.53 %
Date Time Interrogation Session: 20170619083828
HighPow Impedance: 66 Ohm
Implantable Lead Implant Date: 20131211
Implantable Lead Implant Date: 20131211
Implantable Lead Implant Date: 20131211
Implantable Lead Location: 753858
Implantable Lead Location: 753859
Implantable Lead Location: 753860
Implantable Lead Model: 181
Implantable Lead Model: 4396
Implantable Lead Model: 5076
Implantable Lead Serial Number: 32342
Lead Channel Impedance Value: 361 Ohm
Lead Channel Impedance Value: 418 Ohm
Lead Channel Impedance Value: 456 Ohm
Lead Channel Impedance Value: 475 Ohm
Lead Channel Impedance Value: 475 Ohm
Lead Channel Impedance Value: 760 Ohm
Lead Channel Pacing Threshold Amplitude: 0.5 V
Lead Channel Pacing Threshold Amplitude: 0.625 V
Lead Channel Pacing Threshold Amplitude: 3 V
Lead Channel Pacing Threshold Pulse Width: 0.4 ms
Lead Channel Pacing Threshold Pulse Width: 0.4 ms
Lead Channel Pacing Threshold Pulse Width: 0.8 ms
Lead Channel Sensing Intrinsic Amplitude: 11.625 mV
Lead Channel Sensing Intrinsic Amplitude: 11.625 mV
Lead Channel Sensing Intrinsic Amplitude: 3.125 mV
Lead Channel Sensing Intrinsic Amplitude: 3.125 mV
Lead Channel Setting Pacing Amplitude: 2 V
Lead Channel Setting Pacing Amplitude: 3.25 V
Lead Channel Setting Pacing Pulse Width: 0.8 ms
Lead Channel Setting Sensing Sensitivity: 0.45 mV

## 2015-11-21 ENCOUNTER — Encounter: Payer: Self-pay | Admitting: Cardiology

## 2015-12-17 ENCOUNTER — Other Ambulatory Visit: Payer: Self-pay | Admitting: Internal Medicine

## 2016-02-16 ENCOUNTER — Ambulatory Visit (INDEPENDENT_AMBULATORY_CARE_PROVIDER_SITE_OTHER): Payer: BC Managed Care – PPO | Admitting: *Deleted

## 2016-02-16 DIAGNOSIS — I428 Other cardiomyopathies: Secondary | ICD-10-CM

## 2016-02-16 DIAGNOSIS — I429 Cardiomyopathy, unspecified: Secondary | ICD-10-CM | POA: Diagnosis not present

## 2016-02-16 NOTE — Progress Notes (Signed)
Remote ICD transmission.   

## 2016-02-18 ENCOUNTER — Encounter: Payer: Self-pay | Admitting: Cardiology

## 2016-02-19 ENCOUNTER — Other Ambulatory Visit: Payer: Self-pay | Admitting: Internal Medicine

## 2016-02-27 ENCOUNTER — Other Ambulatory Visit (HOSPITAL_COMMUNITY): Payer: Self-pay | Admitting: Cardiology

## 2016-03-05 LAB — CUP PACEART REMOTE DEVICE CHECK
Battery Remaining Longevity: 30 mo
Battery Voltage: 2.95 V
Brady Statistic AP VP Percent: 0.01 %
Brady Statistic AP VS Percent: 0.04 %
Brady Statistic AS VP Percent: 98.7 %
Brady Statistic AS VS Percent: 1.25 %
Brady Statistic RA Percent Paced: 0.05 %
Brady Statistic RV Percent Paced: 7.88 %
Date Time Interrogation Session: 20170918052209
HighPow Impedance: 74 Ohm
Implantable Lead Implant Date: 20131211
Implantable Lead Implant Date: 20131211
Implantable Lead Implant Date: 20131211
Implantable Lead Location: 753858
Implantable Lead Location: 753859
Implantable Lead Location: 753860
Implantable Lead Model: 181
Implantable Lead Model: 4396
Implantable Lead Model: 5076
Implantable Lead Serial Number: 32342
Lead Channel Impedance Value: 399 Ohm
Lead Channel Impedance Value: 475 Ohm
Lead Channel Impedance Value: 551 Ohm
Lead Channel Impedance Value: 589 Ohm
Lead Channel Impedance Value: 608 Ohm
Lead Channel Impedance Value: 988 Ohm
Lead Channel Pacing Threshold Amplitude: 0.5 V
Lead Channel Pacing Threshold Amplitude: 0.625 V
Lead Channel Pacing Threshold Amplitude: 3 V
Lead Channel Pacing Threshold Pulse Width: 0.4 ms
Lead Channel Pacing Threshold Pulse Width: 0.4 ms
Lead Channel Pacing Threshold Pulse Width: 0.8 ms
Lead Channel Sensing Intrinsic Amplitude: 10.625 mV
Lead Channel Sensing Intrinsic Amplitude: 10.625 mV
Lead Channel Sensing Intrinsic Amplitude: 3.25 mV
Lead Channel Sensing Intrinsic Amplitude: 3.25 mV
Lead Channel Setting Pacing Amplitude: 2 V
Lead Channel Setting Pacing Amplitude: 3.25 V
Lead Channel Setting Pacing Pulse Width: 0.8 ms
Lead Channel Setting Sensing Sensitivity: 0.45 mV

## 2016-04-04 ENCOUNTER — Other Ambulatory Visit: Payer: Self-pay | Admitting: Cardiology

## 2016-04-07 ENCOUNTER — Other Ambulatory Visit: Payer: Self-pay | Admitting: Internal Medicine

## 2016-04-26 ENCOUNTER — Other Ambulatory Visit: Payer: Self-pay | Admitting: Internal Medicine

## 2016-05-14 ENCOUNTER — Other Ambulatory Visit: Payer: Self-pay | Admitting: Internal Medicine

## 2016-05-17 ENCOUNTER — Ambulatory Visit (INDEPENDENT_AMBULATORY_CARE_PROVIDER_SITE_OTHER): Payer: BC Managed Care – PPO | Admitting: *Deleted

## 2016-05-17 DIAGNOSIS — I428 Other cardiomyopathies: Secondary | ICD-10-CM

## 2016-05-18 LAB — CUP PACEART REMOTE DEVICE CHECK
Battery Remaining Longevity: 25 mo
Battery Voltage: 2.94 V
Brady Statistic AP VP Percent: 0.01 %
Brady Statistic AP VS Percent: 0.04 %
Brady Statistic AS VP Percent: 98.69 %
Brady Statistic AS VS Percent: 1.26 %
Brady Statistic RA Percent Paced: 0.05 %
Brady Statistic RV Percent Paced: 9.36 %
Date Time Interrogation Session: 20171218083626
HighPow Impedance: 78 Ohm
Implantable Lead Implant Date: 20131211
Implantable Lead Implant Date: 20131211
Implantable Lead Implant Date: 20131211
Implantable Lead Location: 753858
Implantable Lead Location: 753859
Implantable Lead Location: 753860
Implantable Lead Model: 181
Implantable Lead Model: 4396
Implantable Lead Model: 5076
Implantable Lead Serial Number: 32342
Implantable Pulse Generator Implant Date: 20131211
Lead Channel Impedance Value: 399 Ohm
Lead Channel Impedance Value: 475 Ohm
Lead Channel Impedance Value: 513 Ohm
Lead Channel Impedance Value: 551 Ohm
Lead Channel Impedance Value: 589 Ohm
Lead Channel Impedance Value: 836 Ohm
Lead Channel Pacing Threshold Amplitude: 0.5 V
Lead Channel Pacing Threshold Amplitude: 0.75 V
Lead Channel Pacing Threshold Amplitude: 3 V
Lead Channel Pacing Threshold Pulse Width: 0.4 ms
Lead Channel Pacing Threshold Pulse Width: 0.4 ms
Lead Channel Pacing Threshold Pulse Width: 0.8 ms
Lead Channel Sensing Intrinsic Amplitude: 3.625 mV
Lead Channel Sensing Intrinsic Amplitude: 3.625 mV
Lead Channel Sensing Intrinsic Amplitude: 9.875 mV
Lead Channel Sensing Intrinsic Amplitude: 9.875 mV
Lead Channel Setting Pacing Amplitude: 2 V
Lead Channel Setting Pacing Amplitude: 2.5 V
Lead Channel Setting Pacing Amplitude: 3.25 V
Lead Channel Setting Pacing Pulse Width: 0.4 ms
Lead Channel Setting Pacing Pulse Width: 0.8 ms
Lead Channel Setting Sensing Sensitivity: 0.45 mV

## 2016-05-18 NOTE — Progress Notes (Signed)
Remote ICD transmission.   

## 2016-05-19 ENCOUNTER — Encounter: Payer: Self-pay | Admitting: Cardiology

## 2016-05-19 NOTE — Progress Notes (Signed)
Letter  

## 2016-06-06 ENCOUNTER — Other Ambulatory Visit: Payer: Self-pay | Admitting: Cardiology

## 2016-07-27 ENCOUNTER — Other Ambulatory Visit: Payer: Self-pay | Admitting: Cardiology

## 2016-08-05 ENCOUNTER — Other Ambulatory Visit: Payer: Self-pay | Admitting: Internal Medicine

## 2016-08-17 ENCOUNTER — Other Ambulatory Visit: Payer: Self-pay | Admitting: Internal Medicine

## 2016-08-17 MED ORDER — ATORVASTATIN CALCIUM 20 MG PO TABS: 20.0000 mg | ORAL_TABLET | Freq: Every day | ORAL | 0 refills | Status: DC

## 2016-08-24 ENCOUNTER — Other Ambulatory Visit: Payer: Self-pay | Admitting: Internal Medicine

## 2016-08-25 ENCOUNTER — Other Ambulatory Visit: Payer: Self-pay | Admitting: Internal Medicine

## 2016-08-30 NOTE — Progress Notes (Signed)
[place]   Electrophysiology Office Note Date: 09/01/2016  ID:  Cynthia, Roth [birth year], MRN [id number]  PCP: Maximino Greenland, MD Primary Cardiologist: [name] Electrophysiologist: Caryl Comes  CC: Routine ICD follow-up  Cynthia Roth is a 66 y.o. female seen today for Dr Caryl Comes.  She presents today for routine electrophysiology followup.  Since last being seen in our clinic, the patient reports doing very well. She denies chest pain, palpitations, dyspnea, PND, orthopnea, nausea, vomiting, dizziness, syncope, edema, weight gain, or early satiety.  She has not had ICD shocks.   Device History: MDT CRTD implanted 2013 for NICM, CHF History of appropriate therapy: No History of AAD therapy: No   Past Medical History:  Diagnosis Date  . Allergy   . Biventricular ICD -Medtronic    DOI 12/13 - Medtronic Viva XR CRT-D defibrillator, serial #BLF [id number] H.    . Chronic systolic heart failure ([place]) 12/02/2011  . Dyslipidemia 12/02/2011  . GERD (gastroesophageal reflux disease)   . Gout    "very seldom" (05/10/2012)  . Hx of colonic polyps   . Hypertension    "used to have this; now my BP is low" (05/10/2012)  . LBBB (left bundle branch block) 12/02/2011  . Nonischemic cardiomyopathy ([place])   . Tubular adenoma 1999   history of   Past Surgical History:  Procedure Laterality Date  . BI-VENTRICULAR IMPLANTABLE CARDIOVERTER DEFIBRILLATOR N/A 05/10/2012   Procedure: BI-VENTRICULAR IMPLANTABLE CARDIOVERTER DEFIBRILLATOR  (CRT-D);  Surgeon: Deboraha Sprang, MD;  Location: [facility] CATH LAB;  Service: Cardiovascular;  Laterality: N/A;  . CARDIAC CATHETERIZATION  12-03-11   selectie coronary angiography  . CARDIAC DEFIBRILLATOR PLACEMENT  05/10/2012   CRT-D implantation (05/10/2012)  . LEFT HEART CATHETERIZATION WITH CORONARY ANGIOGRAM N/A 12/03/2011   Procedure: LEFT HEART CATHETERIZATION WITH CORONARY ANGIOGRAM;  Surgeon: Larey Dresser, MD;  Location: [facility] CATH LAB;  Service: Cardiovascular;   Laterality: N/A;  . PARTIAL HYSTERECTOMY  1980's    Current Outpatient Prescriptions  Medication Sig Dispense Refill  . aspirin 81 MG tablet Take 81 mg by mouth daily.    [name] atorvastatin (LIPITOR) 20 MG tablet Take 1 tablet (20 mg total) by mouth daily. *Please keep 09/01/16 appointment for further refills* 30 tablet 0  . B COMPLEX VITAMINS SL Place 1 drop under the tongue daily.    . carvedilol (COREG) 12.5 MG tablet Take 1 tablet (12.5 mg total) by mouth 2 (two) times daily.    . Cholecalciferol (VITAMIN D3) 2000 units TABS Take 1 tablet by mouth daily.    . furosemide (LASIX) 20 MG tablet TAKE 1 TABLET BY MOUTH EVERY OTHER DAY 15 tablet 6  . isosorbide-hydrALAZINE (BIDIL) 20-37.5 MG tablet Take 0.5 tablets by mouth 3 (three) times daily. *Please keep 09/01/16 appointment for further refills* 45 tablet 0  . lisinopril (PRINIVIL,ZESTRIL) 20 MG tablet TAKE 1 TABLET BY MOUTH TWICE A DAY 60 tablet 3  . magnesium oxide (MAG-OX) 400 (241.3 Mg) MG tablet TAKE 1 TABLET (400 MG TOTAL) BY MOUTH DAILY. 90 tablet 0  . spironolactone (ALDACTONE) 25 MG tablet TAKE 1 TABLET BY MOUTH EVERY DAY 30 tablet 3   No current facility-administered medications for this visit.     Allergies:   Decongestant [pseudoephedrine hcl er]   Social History: Social History   Social History  . Marital status: Divorced    Spouse name: n/a  . Number of children: 1  . Years of education: Master's   Occupational History  . Program Programmer, [employer]   Social History Main Topics  . Smoking status: Never Smoker  . Smokeless tobacco: Never Used  . Alcohol use No  . Drug use: No  . Sexual activity: No   Other Topics Concern  . Not on file   Social History Narrative   Lives in [place], and her mother, grandmother and one brother live with her. Divorced with one adult son who lives in [place], [place]. One brother lives in [place], [place]. Another lives in [place]. 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    Family History: Family History  Problem Relation Age of Onset  . Heart failure Maternal Grandmother     diagnosed in age of 81's. still living at age of 61's.  . Colon cancer      family history  . Angina Mother     diagnosed at age of 76's, unsure whether it was a true diagnosis. unsure about the treatment  . Memory loss Mother   . Sleep apnea Brother   . Kidney disease Father   . Diabetes Brother   . Heart attack Maternal Grandmother   . Stroke Maternal Grandmother   . Hypertension Neg Hx     Review of Systems: All other systems reviewed and are otherwise negative except as noted above.   Physical Exam: VS:  BP 120/70   Pulse 75   Ht 5\' 4"  (1.626 m)   Wt 204 lb 8 oz (92.8 kg)   SpO2 98%   BMI 35.10 kg/m  , BMI Body mass index is 35.1 kg/m.  GEN- The patient is well appearing, alert and oriented x 3 today.   HEENT: normocephalic, atraumatic; sclera clear, conjunctiva pink; hearing intact; oropharynx clear; neck supple  Lungs- Clear to ausculation bilaterally, normal work of breathing.  No wheezes, rales, rhonchi Heart- Regular rate and rhythm (paced) GI- soft, non-tender, non-distended, bowel sounds present  Extremities- no clubbing, cyanosis, +trace dependent edema  MS- no significant deformity or atrophy Skin- warm and dry, no rash or lesion; ICD pocket well healed Psych- euthymic mood, full affect Neuro- strength and sensation are intact  ICD interrogation- reviewed in detail today,  See PACEART report  EKG:  EKG is ordered today. The ekg ordered today shows sinus rhythm with V pacing   Recent Labs: No results found for requested labs within last 8760 hours.   Wt Readings from Last 3 Encounters:  09/01/16 204 lb 8 oz (92.8 kg)  08/18/15 201 lb (91.2 kg)  06/13/15 196 lb (88.9 kg)     Other studies Reviewed: Additional studies/ records that were reviewed today include: Dr Olin Pia office notes  Assessment and Plan:  1.   Chronic systolic dysfunction euvolemic today Stable on an appropriate medical regimen Normal ICD function See Pace Art report No changes today EF normalized post CRT implant BMET today for spironolactone   2.  Obesity Body mass index is 35.1 kg/m. Weight loss encouraged  Will refer back to cardiac rehab today - she was previously referred but not able to go 2/2 taking care of sick mother (now deceased)   Current medicines are reviewed at length with the patient today.   The patient does not have concerns regarding her medicines.  The following changes were made today:  none  Labs/ tests ordered today include: BMET No orders of the defined types were placed in this encounter.    Disposition:   Follow up with [facility], Dr Caryl Comes 1 year  Signed, Chanetta Marshall, NP 09/01/2016 8:24 AM  [place] [phone] (office) [phone] (fax)

## 2016-09-01 ENCOUNTER — Ambulatory Visit (INDEPENDENT_AMBULATORY_CARE_PROVIDER_SITE_OTHER): Payer: BC Managed Care – PPO | Admitting: Nurse Practitioner

## 2016-09-01 ENCOUNTER — Other Ambulatory Visit: Payer: Self-pay | Admitting: Nurse Practitioner

## 2016-09-01 ENCOUNTER — Encounter (INDEPENDENT_AMBULATORY_CARE_PROVIDER_SITE_OTHER): Payer: Self-pay

## 2016-09-01 ENCOUNTER — Other Ambulatory Visit: Payer: Self-pay | Admitting: *Deleted

## 2016-09-01 VITALS — BP 120/70 | HR 75 | Ht 64.0 in | Wt 204.5 lb

## 2016-09-01 DIAGNOSIS — I5022 Chronic systolic (congestive) heart failure: Secondary | ICD-10-CM

## 2016-09-01 LAB — CUP PACEART INCLINIC DEVICE CHECK
Date Time Interrogation Session: 20180404082837
Implantable Lead Implant Date: 20131211
Implantable Lead Implant Date: 20131211
Implantable Lead Implant Date: 20131211
Implantable Lead Location: 753858
Implantable Lead Location: 753859
Implantable Lead Location: 753860
Implantable Lead Model: 181
Implantable Lead Model: 4396
Implantable Lead Model: 5076
Implantable Lead Serial Number: 32342
Implantable Pulse Generator Implant Date: 20131211

## 2016-09-01 LAB — BASIC METABOLIC PANEL
BUN/Creatinine Ratio: 18 (ref 12–28)
BUN: 21 mg/dL (ref 8–27)
CO2: 23 mmol/L (ref 18–29)
Calcium: 9.5 mg/dL (ref 8.7–10.3)
Chloride: 103 mmol/L (ref 96–106)
Creatinine, Ser: 1.16 mg/dL — ABNORMAL HIGH (ref 0.57–1.00)
GFR calc Af Amer: 57 mL/min/{1.73_m2} — ABNORMAL LOW (ref >59)
GFR calc non Af Amer: 50 mL/min/{1.73_m2} — ABNORMAL LOW (ref >59)
Glucose: 98 mg/dL (ref 65–99)
Potassium: 4.4 mmol/L (ref 3.5–5.2)
Sodium: 142 mmol/L (ref 134–144)

## 2016-09-01 MED ORDER — MAGNESIUM OXIDE 400 (241.3 MG) MG PO TABS: 400.0000 mg | ORAL_TABLET | Freq: Every day | ORAL | 3 refills | Status: DC

## 2016-09-01 NOTE — Patient Instructions (Addendum)
Medication Instructions:   Your physician recommends that you continue on your current medications as directed. Please refer to the Current Medication list given to you today.   If you need a refill on your cardiac medications before your next appointment, please call your pharmacy.  Labwork: BMET TODAY    Testing/Procedures:  You have been referred to CARDIAC REHAB SOME  WILL CONTACT YOU BACK FROM [facility] CL[facility] FOR FURTHER INSTRUCTIONS      Follow-Up:  Your physician wants you to follow-up in: [place] will receive a reminder letter in the mail two months in advance. If you don't receive a letter, please call our office to schedule the follow-up appointment.    Remote monitoring is used to monitor your Pacemaker of ICD from home. This monitoring reduces the number of office visits required to check your device to one time per year. It allows [place] to keep an eye on the functioning of your device to ensure it is working properly. You are scheduled for a device check from home on . 12-01-2016 You may send your transmission at any time that day. If you have a wireless device, the transmission will be sent automatically. After your physician reviews your transmission, you will receive a postcard with your next transmission date.     Any Other Special Instructions Will Be Listed Below (If Applicable).

## 2016-09-02 ENCOUNTER — Telehealth (HOSPITAL_COMMUNITY): Payer: Self-pay | Admitting: Internal Medicine

## 2016-09-02 NOTE — Telephone Encounter (Signed)
Patient insurance is active and benefits verified. Patient has BCBS - no co-payment, deductible $1080/$0 has been met, out of pocket [phone] has been met, no co-insurance, no pre-authorization and no limit on visit. Passport/reference [phone].

## 2016-09-04 ENCOUNTER — Other Ambulatory Visit: Payer: Self-pay | Admitting: Cardiology

## 2016-09-04 ENCOUNTER — Other Ambulatory Visit: Payer: Self-pay | Admitting: Internal Medicine

## 2016-09-06 ENCOUNTER — Telehealth: Payer: Self-pay | Admitting: Internal Medicine

## 2016-09-06 MED ORDER — ATORVASTATIN CALCIUM 20 MG PO TABS: 20.0000 mg | ORAL_TABLET | Freq: Every day | ORAL | 3 refills | Status: DC

## 2016-09-06 NOTE — Telephone Encounter (Signed)
New message        *STAT* If patient is at the pharmacy, call can be transferred to refill team.   1. Which medications need to be refilled? (please list name of each medication and dose if known)  Atorvastatin 20mg , spironolactone 25mg  2. Which pharmacy/location (including street and city if local pharmacy) [facility] [address]  3. Do they need a 30 day or 90 day supply? Whichever ins will pay for

## 2016-09-08 ENCOUNTER — Telehealth (HOSPITAL_COMMUNITY): Payer: Self-pay | Admitting: Internal Medicine

## 2016-09-08 NOTE — Telephone Encounter (Signed)
I called and left message on patient voicemail about scheduling and participating in the cardiac rehab program. I left my contact information on the voicemail.

## 2016-09-15 ENCOUNTER — Other Ambulatory Visit: Payer: Self-pay | Admitting: Internal Medicine

## 2016-09-16 ENCOUNTER — Other Ambulatory Visit (HOSPITAL_COMMUNITY): Payer: Self-pay | Admitting: Cardiology

## 2016-09-17 ENCOUNTER — Other Ambulatory Visit: Payer: Self-pay | Admitting: Cardiology

## 2016-09-20 ENCOUNTER — Other Ambulatory Visit: Payer: Self-pay | Admitting: Cardiology

## 2016-09-21 NOTE — Telephone Encounter (Signed)
°*  STAT* If patient is at the pharmacy, call can be transferred to refill team.   1. Which medications need to be refilled? (please list name of each medication and dose if known) spironolactone  2. Which pharmacy/location (including street and city if local pharmacy) [id number]  3. Do they need a 30 day or 90 day supply? 30 and refills

## 2016-09-27 ENCOUNTER — Other Ambulatory Visit: Payer: Self-pay | Admitting: Cardiology

## 2016-09-30 ENCOUNTER — Encounter (HOSPITAL_COMMUNITY): Payer: Self-pay

## 2016-09-30 ENCOUNTER — Ambulatory Visit (HOSPITAL_COMMUNITY)
Admission: RE | Admit: 2016-09-30 | Discharge: 2016-09-30 | Disposition: A | Discharge status: Home / Self Care | Payer: BC Managed Care – PPO | Source: Ambulatory Visit | Attending: Internal Medicine | Admitting: Internal Medicine

## 2016-09-30 VITALS — BP 118/72 | HR 76 | Wt 208.4 lb

## 2016-09-30 DIAGNOSIS — E669 Obesity, unspecified: Secondary | ICD-10-CM | POA: Diagnosis not present

## 2016-09-30 DIAGNOSIS — I110 Hypertensive heart disease with heart failure: Secondary | ICD-10-CM | POA: Diagnosis not present

## 2016-09-30 DIAGNOSIS — Z7982 Long term (current) use of aspirin: Secondary | ICD-10-CM | POA: Insufficient documentation

## 2016-09-30 DIAGNOSIS — E785 Hyperlipidemia, unspecified: Secondary | ICD-10-CM | POA: Diagnosis not present

## 2016-09-30 DIAGNOSIS — I5022 Chronic systolic (congestive) heart failure: Secondary | ICD-10-CM | POA: Diagnosis not present

## 2016-09-30 DIAGNOSIS — I10 Essential (primary) hypertension: Secondary | ICD-10-CM | POA: Diagnosis not present

## 2016-09-30 DIAGNOSIS — I429 Cardiomyopathy, unspecified: Secondary | ICD-10-CM | POA: Insufficient documentation

## 2016-09-30 DIAGNOSIS — I5042 Chronic combined systolic (congestive) and diastolic (congestive) heart failure: Secondary | ICD-10-CM | POA: Diagnosis present

## 2016-09-30 DIAGNOSIS — I447 Left bundle-branch block, unspecified: Secondary | ICD-10-CM | POA: Diagnosis not present

## 2016-09-30 MED ORDER — FUROSEMIDE 20 MG PO TABS: 20.0000 mg | ORAL_TABLET | ORAL | 6 refills | Status: DC

## 2016-09-30 MED ORDER — SPIRONOLACTONE 25 MG PO TABS: 25.0000 mg | ORAL_TABLET | Freq: Every day | ORAL | 3 refills | Status: DC

## 2016-09-30 NOTE — Patient Instructions (Signed)
Continue taking Lasix 20 mg tablet every other day. May take additional 20 mg tablet once daily as needed for swelling/weight gain.  RESTART Spironolactone 25 mg tablet once daily.  Return in 1-2 weeks for lab work.  ______________________________________________________  ______________________________________________________  Follow up with Dr. [name] in 3 months.  ______________________________________________________  ______________________________________________________  Do the following things EVERYDAY: 1) Weigh yourself in the morning before breakfast. Write it down and keep it in a log. 2) Take your medicines as prescribed 3) Eat low salt foods-Limit salt (sodium) to 2000 mg per day.  4) Stay as active as you can everyday 5) Limit all fluids for the day to less than 2 liters

## 2016-09-30 NOTE — Progress Notes (Signed)
Patient ID: Cynthia Roth, female   DOB: [birth year], 66 y.o.   MRN: [id number] PCP: Naliyah Neth is a 66 y.o. female with PMH of nonischemic cardiomyopathy.  Patient was admitted to [facility] in 7/13 with acute CHF.  No prior cardiac history.  No ETOH/Drug history.  No significant family history. No prior viral illness. TSH normal.    She had LBBB on EKG and her echo showed EF 15% with severely dilated LV and diastolic dysfunction. LHC showed no significant coronary disease.  Repeat echo in 10/13 on good medical therapy showed persistent EF 15%.  She had Medtronic CRT-D device placed in 12/13 by Dr. Caryl Comes.  Followup echo in 3/14 showed persistently depressed LV systolic function with EF 20%.  CPX in 07/2013 showed mildly decreased functional capacity, suspect obesity played significant role in this.   Pt presents today for follow up. She has not been seen in HF clinic since 08/2013. She has continued to follow up "yearly" in Dr. Aquilla Hacker office. Feels OK overall. Feels like she is getting some fluid overload. Has been off spiro since November due to expired prescription. Weight has been creeping. Weight up to 201 at home from 191. She does not weight regularly so unsure over what time period, but at least a few weeks. Has been eating high sodium and drinking > 2L. Denies any DOE, orthopnea, bendopnea, or coughing. No PND. Denies lightheadedness or dizziness. Has orientation for cardiac rehab later this month.   Echo 06/30/15 EF 55-60%, Grade 1 DD, Trivial MR, Mild RVH, Mild TI, PA peak pressure 30 mm Hg  Allergies  Allergen Reactions  . Decongestant [Pseudoephedrine Hcl Er]     PATIENT CAN'T REMEMBER    Past Medical History: 1. CHF and Nonischemic cardiomyopathy: patient was admitted with acute CHF on 11/29/11 and Echo showed EF 15% with diffuse hypokinesis, severely dilated LV, severe diastolic dysfunction. Left heart cath showed no angiographic CAD.  TSH, SPEP, urine immunofixation were all  within normal range.  Repeat echo 10/13 with EF 15%, diffuse hypokinesis.  Medtronic CRT-D device placed 12/13 Caryl Comes).  Echo (3/14) with EF 20%, diffuse hypokinesis, mild to moderate MR, normal RV. CPX (3/15) with peak VO2 13.4 adjusted to 21 for ideal body weight, VE/VCO2 slope 31, RER 1.09, mildly decreased functional capacity, suspect obesity plays a role.  - EF improved to 55-60% sp CRT placement.  2. LBBB: noted on hospital admission on 11/29/11. No old EKG. 3. HTN 4. Dyslipidemia 5. Tubular adenoma:  Last colonoscopy in 1999. Followed by GI Dr. Collene Mares.  Family History  Problem Relation Age of Onset  . Heart failure Maternal Grandmother     diagnosed in age of 20's. still living at age of 17's.  . Colon cancer      family history  . Angina Mother     diagnosed at age of 68's, unsure whether it was a true diagnosis. unsure about the treatment  . Memory loss Mother   . Sleep apnea Brother   . Kidney disease Father   . Diabetes Brother   . Heart attack Maternal Grandmother   . Stroke Maternal Grandmother   . Hypertension Neg Hx     Social History   Social History  . Marital status: Divorced    Spouse name: n/a  . Number of children: 1  . Years of education: Master's   Occupational History  . Program Manager [employer]   Social History Main Topics  . Smoking  status: Never Smoker  . Smokeless tobacco: Never Used  . Alcohol use No  . Drug use: No  . Sexual activity: No   Other Topics Concern  . Not on file   Social History Narrative   Lives in [place], and her mother, grandmother and one brother live with her. Divorced with one adult son who lives in [place], [place]. One brother lives in [place], [place]. Another lives in [place]. 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    Current Outpatient Prescriptions on File Prior to Encounter  Medication Sig Dispense Refill  . aspirin 81 MG tablet Take 81 mg by mouth daily.    [name]  atorvastatin (LIPITOR) 20 MG tablet Take 1 tablet (20 mg total) by mouth daily. 90 tablet 3  . B COMPLEX VITAMINS SL Place 1 drop under the tongue daily.    [name] BIDIL 20-37.5 MG tablet TAKE (1/2) TABLET THREE TIMES DAILY. 45 tablet 11  . carvedilol (COREG) 12.5 MG tablet Take 1 tablet (12.5 mg total) by mouth 2 (two) times daily.    . Cholecalciferol (VITAMIN D3) 2000 units TABS Take 1 tablet by mouth daily.    . furosemide (LASIX) 20 MG tablet TAKE 1 TABLET BY MOUTH EVERY OTHER DAY 15 tablet 6  . lisinopril (PRINIVIL,ZESTRIL) 20 MG tablet TAKE 1 TABLET BY MOUTH TWICE A DAY 60 tablet 10  . magnesium oxide (MAG-OX) 400 (241.3 Mg) MG tablet Take 1 tablet (400 mg total) by mouth daily. 90 tablet 3   No current facility-administered medications on file prior to encounter.    Vitals:   09/30/16 0928  BP: 118/72  Pulse: 76  SpO2: 100%  Weight: 208 lb 6 oz (94.5 kg)   Wt Readings from Last 3 Encounters:  09/30/16 208 lb 6 oz (94.5 kg)  09/01/16 204 lb 8 oz (92.8 kg)  08/18/15 201 lb (91.2 kg)    General: Well appearing. No resp difficulty. HEENT: normal Neck: supple. JVD 7-8. Carotids 2+ bilat; no bruits. No thyromegaly or nodule noted. Cor: PMI nondisplaced. RRR, 1/6 early SEM Lungs: CTAB, normal effort. Abdomen: soft, non-tender, distended, no HSM. No bruits or masses. +BS  Extremities: no cyanosis, clubbing, rash, Trace ankle edema.  Neuro: alert & orientedx3, cranial nerves grossly intact. moves all 4 extremities w/o difficulty. Affect pleasant   Assessment/Plan:  1. Chronic systolic heart failure Echo 06/30/15 EF 55-60%, Grade 1 DD, Trivial MR, Mild RVH, Mild TI, PA peak pressure 30 mm Hg - Unclear etiology. EF improved after CRT placement. CPX previously with mildly decreased functional capacity with obesity playing some role.  - NYHA I-II symptoms.  - Volume status mildly elevated at most. Continue lasix 20 mg every other day. Take extra 20 mg as needed.  - Restart spironolactone  25 mg daily. Check BMET 10 days. - Continue lisinopril 20 mg daily. - Continue Bidil 0.5 tab TID.  - Continue coreg 12.5 mg BID.  - Starts cardiac rehab this month.  - Reinforced fluid restriction to < 2 L daily, sodium restriction to less than 2000 mg daily, and the importance of daily weights.   2. LBBB - s/p CRT-D placement with improvement of EF 3. HTN - Meds as above.    Labs and meds as above.   Shirley Friar, PA-C  09/30/2016 9:33 AM  Greater than 50% of the 25 minute visit was spent in counseling/coordination of care regarding disease state education, medication reconciliation, importance of compliance, and fluid/salt restriction.

## 2016-10-14 ENCOUNTER — Ambulatory Visit (HOSPITAL_COMMUNITY)
Admission: RE | Admit: 2016-10-14 | Discharge: 2016-10-14 | Disposition: A | Discharge status: Home / Self Care | Payer: BC Managed Care – PPO | Source: Ambulatory Visit | Attending: Internal Medicine | Admitting: Internal Medicine

## 2016-10-14 DIAGNOSIS — I5022 Chronic systolic (congestive) heart failure: Secondary | ICD-10-CM

## 2016-10-14 LAB — BASIC METABOLIC PANEL
Anion gap: 8 (ref 5–15)
BUN: 22 mg/dL — ABNORMAL HIGH (ref 6–20)
CO2: 26 mmol/L (ref 22–32)
Calcium: 9.2 mg/dL (ref 8.9–10.3)
Chloride: 106 mmol/L (ref 101–111)
Creatinine, Ser: 1.24 mg/dL — ABNORMAL HIGH (ref 0.44–1.00)
GFR calc Af Amer: 52 mL/min — ABNORMAL LOW (ref >60)
GFR calc non Af Amer: 45 mL/min — ABNORMAL LOW (ref >60)
Glucose, Bld: 100 mg/dL — ABNORMAL HIGH (ref 65–99)
Potassium: 4.6 mmol/L (ref 3.5–5.1)
Sodium: 140 mmol/L (ref 135–145)

## 2016-10-19 ENCOUNTER — Encounter (HOSPITAL_COMMUNITY)
Admission: RE | Admit: 2016-10-19 | Discharge: 2016-10-19 | Disposition: A | Discharge status: Home / Self Care | Payer: BC Managed Care – PPO | Source: Ambulatory Visit | Attending: Internal Medicine | Admitting: Internal Medicine

## 2016-10-19 ENCOUNTER — Encounter (HOSPITAL_COMMUNITY): Payer: Self-pay

## 2016-10-19 VITALS — BP 101/62 | HR 85 | Ht 63.5 in | Wt 208.1 lb

## 2016-10-19 DIAGNOSIS — I5022 Chronic systolic (congestive) heart failure: Secondary | ICD-10-CM | POA: Insufficient documentation

## 2016-10-19 NOTE — Progress Notes (Signed)
Cardiac Individual Treatment Plan  Patient Details  Name: Cynthia Roth MRN: [id number] Date of Birth: 03-10-1951 Referring Provider:     CARDIAC REHAB PHASE II ORIENTATION from 10/19/2016 in [facility]  Referring Provider  Caryl Comes,   MD      Initial Encounter Date:    CARDIAC REHAB PHASE II ORIENTATION from 10/19/2016 in [facility]  Date  10/19/16  Referring Provider  Caryl Comes,   MD      Visit Diagnosis: Chronic systolic congestive heart failure ([place])  Patient's Home Medications on Admission:  Current Outpatient Prescriptions:  .  aspirin 81 MG tablet, Take 81 mg by mouth daily., Disp: , Rfl:  .  atorvastatin (LIPITOR) 20 MG tablet, Take 1 tablet (20 mg total) by mouth daily., Disp: 90 tablet, Rfl: 3 .  B COMPLEX VITAMINS SL, Place 1 drop under the tongue daily., Disp: , Rfl:  .  BIDIL 20-37.5 MG tablet, TAKE (1/2) TABLET THREE TIMES DAILY., Disp: 45 tablet, Rfl: 11 .  carvedilol (COREG) 12.5 MG tablet, Take 1 tablet (12.5 mg total) by mouth 2 (two) times daily., Disp: , Rfl:  .  Cholecalciferol (VITAMIN D3) 2000 units TABS, Take 1 tablet by mouth daily., Disp: , Rfl:  .  furosemide (LASIX) 20 MG tablet, Take 1 tablet (20 mg total) by mouth every other day. Make take extra 20 mg tablet once daily as needed for additional swelling/weight gain., Disp: 15 tablet, Rfl: 6 .  lisinopril (PRINIVIL,ZESTRIL) 20 MG tablet, TAKE 1 TABLET BY MOUTH TWICE A DAY, Disp: 60 tablet, Rfl: 10 .  magnesium oxide (MAG-OX) 400 (241.3 Mg) MG tablet, Take 1 tablet (400 mg total) by mouth daily., Disp: 90 tablet, Rfl: 3 .  spironolactone (ALDACTONE) 25 MG tablet, Take 1 tablet (25 mg total) by mouth daily., Disp: 30 tablet, Rfl: 3  Past Medical History: Past Medical History:  Diagnosis Date  . Allergy   . Biventricular ICD -Medtronic    DOI 12/13 - Medtronic Viva XR CRT-D defibrillator, serial #[id number].    . Chronic systolic heart  failure ([place]) 12/02/2011  . Dyslipidemia 12/02/2011  . GERD (gastroesophageal reflux disease)   . Gout    "very seldom" (05/10/2012)  . Hx of colonic polyps   . Hypertension    "used to have this; now my BP is low" (05/10/2012)  . LBBB (left bundle branch block) 12/02/2011  . Nonischemic cardiomyopathy ([place])   . Tubular adenoma 1999   history of    Tobacco Use: History  Smoking Status  . Never Smoker  Smokeless Tobacco  . Never Used    Labs: Recent Review Flowsheet Data    Labs for ITP Cardiac and Pulmonary Rehab Latest Ref Rng & Units 12/01/2011 06/16/2015   Cholestrol 125 - 200 mg/dL 168 182   LDLCALC <130 mg/dL 125(H) 131(H)   HDL >=46 mg/dL 28(L) 32(L)   Trlycerides <150 mg/dL 73 94      Capillary Blood Glucose: No results found for: GLUCAP   Exercise Target Goals: Date: 10/19/16  Exercise Program Goal: Individual exercise prescription set with THRR, safety & activity barriers. Participant demonstrates ability to understand and report RPE using BORG scale, to self-measure pulse accurately, and to acknowledge the importance of the exercise prescription.  Exercise Prescription Goal: Starting with aerobic activity 30 plus minutes a day, 3 days per week for initial exercise prescription. Provide home exercise prescription and guidelines that participant acknowledges understanding prior to discharge.  Activity Barriers & Risk Stratification:     Activity Barriers & Cardiac Risk Stratification - 10/19/16 1038      Activity Barriers & Cardiac Risk Stratification   Activity Barriers Other (comment);Deconditioning;Muscular Weakness   Comments L sided back; R knee and foot in pain when wearing heels at church   Cardiac Risk Stratification High      6 Minute Walk:     6 Minute Walk    Row Name 10/19/16 0932 10/19/16 1038       6 Minute Walk   Phase Initial  -    Distance 1467 feet  -    Walk Time 6 minutes  -    # of Rest Breaks 0  -    MPH  - 2.78    METS  - 3     RPE 15  -    VO2 Peak  - 10.5    Symptoms No  -    Resting HR  - 85 bpm    Resting BP 101/62  -    Max Ex. HR  - 123 bpm    Max Ex. BP 107/70  -    2 Minute Post BP  - 100/60       Oxygen Initial Assessment:   Oxygen Re-Evaluation:   Oxygen Discharge (Final Oxygen Re-Evaluation):   Initial Exercise Prescription:     Initial Exercise Prescription - 10/19/16 1000      Date of Initial Exercise RX and Referring Provider   Date 10/19/16   Referring Provider Caryl Comes,   MD     Bike   Level 0.8   Minutes 10   METs 2.59     NuStep   Level 3   SPM 80   Minutes 10   METs 2     Track   Laps 11   Minutes 10   METs 2.92     Prescription Details   Frequency (times per week) 3   Duration Progress to 30 minutes of continuous aerobic without signs/symptoms of physical distress     Intensity   THRR 40-80% of Max Heartrate 62-125   Ratings of Perceived Exertion 11-13   Perceived Dyspnea 0-4     Progression   Progression Continue to progress workloads to maintain intensity without signs/symptoms of physical distress.     Resistance Training   Training Prescription Yes   Weight 2lbs   Reps 10-15      Perform Capillary Blood Glucose checks as needed.  Exercise Prescription Changes:   Exercise Comments:   Exercise Goals and Review:     Exercise Goals    Row Name 10/19/16 0850             Exercise Goals   Increase Physical Activity Yes       Intervention Provide advice, education, support and counseling about physical activity/exercise needs.;Develop an individualized exercise prescription for aerobic and resistive training based on initial evaluation findings, risk stratification, comorbidities and participant's personal goals.       Expected Outcomes Achievement of increased cardiorespiratory fitness and enhanced flexibility, muscular endurance and strength shown through measurements of functional capacity and personal statement of participant.        Increase Strength and Stamina Yes       Intervention Provide advice, education, support and counseling about physical activity/exercise needs.;Develop an individualized exercise prescription for aerobic and resistive training based on initial evaluation findings, risk stratification, comorbidities and participant's personal goals.       Expected  Outcomes Achievement of increased cardiorespiratory fitness and enhanced flexibility, muscular endurance and strength shown through measurements of functional capacity and personal statement of participant.          Exercise Goals Re-Evaluation :    Discharge Exercise Prescription (Final Exercise Prescription Changes):   Nutrition:  Target Goals: Understanding of nutrition guidelines, daily intake of sodium 1500mg , cholesterol 200mg , calories 30% from fat and 7% or less from saturated fats, daily to have 5 or more servings of fruits and vegetables.  Biometrics:     Pre Biometrics - 10/19/16 0933      Pre Biometrics   Waist Circumference 40.75 inches   Hip Circumference 50 inches   Waist to Hip Ratio 0.82 %   Triceps Skinfold 35 mm   Grip Strength 30 kg   Flexibility 14.5 in   Single Leg Stand 3 seconds       Nutrition Therapy Plan and Nutrition Goals:   Nutrition Discharge: Nutrition Scores:   Nutrition Goals Re-Evaluation:   Nutrition Goals Re-Evaluation:   Nutrition Goals Discharge (Final Nutrition Goals Re-Evaluation):   Psychosocial: Target Goals: Acknowledge presence or absence of significant depression and/or stress, maximize coping skills, provide positive support system. Participant is able to verbalize types and ability to use techniques and skills needed for reducing stress and depression.  Initial Review & Psychosocial Screening:     Initial Psych Review & Screening - 10/19/16 1117      Initial Review   Current issues with None Identified     Family Dynamics   Good Support System? Yes  son and brother      Barriers   Psychosocial barriers to participate in program There are no identifiable barriers or psychosocial needs.     Screening Interventions   Interventions Encouraged to exercise;Provide feedback about the scores to participant      Quality of Life Scores:     Quality of Life - 10/19/16 0949      Quality of Life Scores   Health/Function Pre 21.43 %   Socioeconomic Pre 20.86 %   Psych/Spiritual Pre 22.29 %   Family Pre 22.25 %   GLOBAL Pre 21.59 %      PHQ-9: Recent Review Flowsheet Data    There is no flowsheet data to display.     Interpretation of Total Score  Total Score Depression Severity:  1-4 = Minimal depression, 5-9 = Mild depression, 10-14 = Moderate depression, 15-19 = Moderately severe depression, 20-27 = Severe depression   Psychosocial Evaluation and Intervention:   Psychosocial Re-Evaluation:   Psychosocial Discharge (Final Psychosocial Re-Evaluation):   Vocational Rehabilitation: Provide vocational rehab assistance to qualifying candidates.   Vocational Rehab Evaluation & Intervention:     Vocational Rehab - 10/19/16 1114      Initial Vocational Rehab Evaluation & Intervention   Assessment shows need for Vocational Rehabilitation No      Education: Education Goals: Education classes will be provided on a weekly basis, covering required topics. Participant will state understanding/return demonstration of topics presented.  Learning Barriers/Preferences:     Learning Barriers/Preferences - 10/19/16 0849      Learning Barriers/Preferences   Learning Barriers Sight   Learning Preferences Written Material;Video;Verbal Instruction;Skilled Demonstration;Pictoral      Education Topics: Count Your Pulse:  -Group instruction provided by verbal instruction, demonstration, patient participation and written materials to support subject.  Instructors address importance of being able to find your pulse and how to count your pulse when  at home without a heart  monitor.  Patients get hands on experience counting their pulse with staff help and individually.   Heart Attack, Angina, and Risk Factor Modification:  -Group instruction provided by verbal instruction, video, and written materials to support subject.  Instructors address signs and symptoms of angina and heart attacks.    Also discuss risk factors for heart disease and how to make changes to improve heart health risk factors.   Functional Fitness:  -Group instruction provided by verbal instruction, demonstration, patient participation, and written materials to support subject.  Instructors address safety measures for doing things around the house.  Discuss how to get up and down off the floor, how to pick things up properly, how to safely get out of a chair without assistance, and balance training.   Meditation and Mindfulness:  -Group instruction provided by verbal instruction, patient participation, and written materials to support subject.  Instructor addresses importance of mindfulness and meditation practice to help reduce stress and improve awareness.  Instructor also leads participants through a meditation exercise.    Stretching for Flexibility and Mobility:  -Group instruction provided by verbal instruction, patient participation, and written materials to support subject.  Instructors lead participants through series of stretches that are designed to increase flexibility thus improving mobility.  These stretches are additional exercise for major muscle groups that are typically performed during regular warm up and cool down.   Hands Only CPR:  -Group verbal, video, and participation provides a basic overview of AHA guidelines for community CPR. Role-play of emergencies allow participants the opportunity to practice calling for help and chest compression technique with discussion of AED use.   Hypertension: -Group verbal and written instruction that provides a  basic overview of hypertension including the most recent diagnostic guidelines, risk factor reduction with self-care instructions and medication management.    Nutrition I class: Heart Healthy Eating:  -Group instruction provided by PowerPoint slides, verbal discussion, and written materials to support subject matter. The instructor gives an explanation and review of the Therapeutic Lifestyle Changes diet recommendations, which includes a discussion on lipid goals, dietary fat, sodium, fiber, plant stanol/sterol esters, sugar, and the components of a well-balanced, healthy diet.   Nutrition II class: Lifestyle Skills:  -Group instruction provided by PowerPoint slides, verbal discussion, and written materials to support subject matter. The instructor gives an explanation and review of label reading, grocery shopping for heart health, heart healthy recipe modifications, and ways to make healthier choices when eating out.   Diabetes Question & Answer:  -Group instruction provided by PowerPoint slides, verbal discussion, and written materials to support subject matter. The instructor gives an explanation and review of diabetes co-morbidities, pre- and post-prandial blood glucose goals, pre-exercise blood glucose goals, signs, symptoms, and treatment of hypoglycemia and hyperglycemia, and foot care basics.   Diabetes Blitz:  -Group instruction provided by PowerPoint slides, verbal discussion, and written materials to support subject matter. The instructor gives an explanation and review of the physiology behind type 1 and type 2 diabetes, diabetes medications and rational behind using different medications, pre- and post-prandial blood glucose recommendations and Hemoglobin A1c goals, diabetes diet, and exercise including blood glucose guidelines for exercising safely.    Portion Distortion:  -Group instruction provided by PowerPoint slides, verbal discussion, written materials, and food models to  support subject matter. The instructor gives an explanation of serving size versus portion size, changes in portions sizes over the last 20 years, and what consists of a serving from each food group.   Stress  Management:  -Group instruction provided by verbal instruction, video, and written materials to support subject matter.  Instructors review role of stress in heart disease and how to cope with stress positively.     Exercising on Your Own:  -Group instruction provided by verbal instruction, power point, and written materials to support subject.  Instructors discuss benefits of exercise, components of exercise, frequency and intensity of exercise, and end points for exercise.  Also discuss use of nitroglycerin and activating EMS.  Review options of places to exercise outside of rehab.  Review guidelines for sex with heart disease.   Cardiac Drugs I:  -Group instruction provided by verbal instruction and written materials to support subject.  Instructor reviews cardiac drug classes: antiplatelets, anticoagulants, beta blockers, and statins.  Instructor discusses reasons, side effects, and lifestyle considerations for each drug class.   Cardiac Drugs II:  -Group instruction provided by verbal instruction and written materials to support subject.  Instructor reviews cardiac drug classes: angiotensin converting enzyme inhibitors (ACE-I), angiotensin II receptor blockers (ARBs), nitrates, and calcium channel blockers.  Instructor discusses reasons, side effects, and lifestyle considerations for each drug class.   Anatomy and Physiology of the Circulatory System:  Group verbal and written instruction and models provide basic cardiac anatomy and physiology, with the coronary electrical and arterial systems. Review of: AMI, Angina, Valve disease, Heart Failure, Peripheral Artery Disease, Cardiac Arrhythmia, Pacemakers, and the ICD.   Other Education:  -Group or individual verbal, written, or  video instructions that support the educational goals of the cardiac rehab program.   Knowledge Questionnaire Score:     Knowledge Questionnaire Score - 10/19/16 0950      Knowledge Questionnaire Score   Pre Score 22/24      Core Components/Risk Factors/Patient Goals at Admission:     Personal Goals and Risk Factors at Admission - 10/19/16 0934      Core Components/Risk Factors/Patient Goals on Admission    Weight Management Yes;Obesity   Intervention Weight Management: Develop a combined nutrition and exercise program designed to reach desired caloric intake, while maintaining appropriate intake of nutrient and fiber, sodium and fats, and appropriate energy expenditure required for the weight goal.;Weight Management: Provide education and appropriate resources to help participant work on and attain dietary goals.;Weight Management/Obesity: Establish reasonable short term and long term weight goals.;Obesity: Provide education and appropriate resources to help participant work on and attain dietary goals.   Admit Weight 208 lb 1.8 oz (94.4 kg)   Goal Weight: Short Term 198 lb (89.8 kg)   Goal Weight: Long Term 150 lb (68 kg)   Expected Outcomes Short Term: Continue to assess and modify interventions until short term weight is achieved;Long Term: Adherence to nutrition and physical activity/exercise program aimed toward attainment of established weight goal;Weight Loss: Understanding of general recommendations for a balanced deficit meal plan, which promotes 1-2 lb weight loss per week and includes a negative energy balance of [phone] kcal/d;Understanding of distribution of calorie intake throughout the day with the consumption of 4-5 meals/snacks;Understanding recommendations for meals to include 15-35% energy as protein, 25-35% energy from fat, 35-60% energy from carbohydrates, less than 200mg  of dietary cholesterol, 20-35 gm of total fiber daily   Heart Failure Yes   Intervention Provide  a combined exercise and nutrition program that is supplemented with education, support and counseling about heart failure. Directed toward relieving symptoms such as shortness of breath, decreased exercise tolerance, and extremity edema.   Expected Outcomes Improve functional capacity of life;Long term:  Adoption of self-care skills and reduction of barriers for early signs and symptoms recognition and intervention leading to self-care maintenance.;Short term: Daily weights obtained and reported for increase. Utilizing diuretic protocols set by physician.;Short term: Attendance in program 2-3 days a week with increased exercise capacity. Reported lower sodium intake. Reported increased fruit and vegetable intake. Reports medication compliance.   Hypertension Yes   Intervention Provide education on lifestyle modifcations including regular physical activity/exercise, weight management, moderate sodium restriction and increased consumption of fresh fruit, vegetables, and low fat dairy, alcohol moderation, and smoking cessation.;Monitor prescription use compliance.   Expected Outcomes Short Term: Continued assessment and intervention until BP is < 140/46mm HG in hypertensive participants. < 130/23mm HG in hypertensive participants with diabetes, heart failure or chronic kidney disease.;Long Term: Maintenance of blood pressure at goal levels.   Lipids Yes   Intervention Provide education and support for participant on nutrition & aerobic/resistive exercise along with prescribed medications to achieve LDL 70mg , HDL >40mg .   Expected Outcomes Short Term: Participant states understanding of desired cholesterol values and is compliant with medications prescribed. Participant is following exercise prescription and nutrition guidelines.;Long Term: Cholesterol controlled with medications as prescribed, with individualized exercise RX and with personalized nutrition plan. Value goals: LDL < 70mg , HDL > 40 mg.   Stress  Yes   Intervention Offer individual and/or small group education and counseling on adjustment to heart disease, stress management and health-related lifestyle change. Teach and support self-help strategies.;Refer participants experiencing significant psychosocial distress to appropriate mental health specialists for further evaluation and treatment. When possible, include family members and significant others in education/counseling sessions.   Expected Outcomes Short Term: Participant demonstrates changes in health-related behavior, relaxation and other stress management skills, ability to obtain effective social support, and compliance with psychotropic medications if prescribed.;Long Term: Emotional wellbeing is indicated by absence of clinically significant psychosocial distress or social isolation.   Personal Goal Other Yes   Personal Goal To be at the appropriate weight for height (healthy BMI range). Learn HH and diabetic nutrition   Intervention Provide nutrition counseling and exercise programming to assist with weightloss goals and improve dietary habits   Expected Outcomes Pt will have a better understanding of diabetes, learn HH diet, develop an exercise routine to assist with losing weight.      Core Components/Risk Factors/Patient Goals Review:    Core Components/Risk Factors/Patient Goals at Discharge (Final Review):    ITP Comments:     ITP Comments    Row Name 10/19/16 0835           ITP Comments Dr. Fransico Him, Medical Director           Comments: Patient attended orientation from [phone]  to review rules and guidelines for program. Completed 6 minute walk test, Intitial ITP, and exercise prescription.  VSS. Telemetry-V paced,  Asymptomatic.

## 2016-10-19 NOTE — Progress Notes (Signed)
Cardiac Rehab Medication Review by a Pharmacist  Does the patient  feel that his/her medications are working for him/her? Yes   Has the patient been experiencing any side effects to the medications prescribed? No   Does the patient measure his/her own blood pressure or blood glucose at home?  No   Does the patient have any problems obtaining medications due to transportation or finances?  No   Understanding of regimen: Good  Understanding of indications: Good  Potential of compliance: Good    Pharmacist comments:   Patient presents ambulating unassisted and in good spirits today. Medication indications, dosing, frequency, and notable side effects reviewed with patient. Patient verbalized concern regarding issues having her spironolactone filled several months ago, but reports this has been resolved and she no longer has a problem filling the prescription.   I encouraged her to have her BP checked at the pharmacy should she every be curious or feel lightheaded/dizzy.   Time offered for discussion with questions. No further questions or concerns at conclusion of our visit.   Argie Ramming, PharmD Pharmacy Resident  Pager [phone] 07/13/16 8:02 AM

## 2016-10-27 ENCOUNTER — Encounter (HOSPITAL_COMMUNITY)
Admission: RE | Admit: 2016-10-27 | Discharge: 2016-10-27 | Disposition: A | Discharge status: Home / Self Care | Payer: BC Managed Care – PPO | Source: Ambulatory Visit | Attending: Internal Medicine | Admitting: Internal Medicine

## 2016-10-27 ENCOUNTER — Encounter (HOSPITAL_COMMUNITY): Payer: Self-pay

## 2016-10-27 DIAGNOSIS — I5022 Chronic systolic (congestive) heart failure: Secondary | ICD-10-CM

## 2016-10-27 NOTE — Progress Notes (Signed)
Cardiac Individual Treatment Plan  Patient Details  Name: Cynthia Roth MRN: [id number] Date of Birth: January 29, 1951 Referring Provider:     CARDIAC REHAB PHASE II ORIENTATION from 10/19/2016 in [place]  Referring Provider  Klein,Steven  MD      Initial Encounter Date:    CARDIAC REHAB PHASE II ORIENTATION from 10/19/2016 in [place]  Date  10/19/16  Referring Provider  Klein,Steven  MD      Visit Diagnosis: Chronic systolic congestive heart failure ([place])  Patient's Home Medications on Admission:  Current Outpatient Prescriptions:  .  aspirin 81 MG tablet, Take 81 mg by mouth daily., Disp: , Rfl:  .  atorvastatin (LIPITOR) 20 MG tablet, Take 1 tablet (20 mg total) by mouth daily., Disp: 90 tablet, Rfl: 3 .  B COMPLEX VITAMINS SL, Place 1 drop under the tongue daily., Disp: , Rfl:  .  BIDIL 20-37.5 MG tablet, TAKE (1/2) TABLET THREE TIMES DAILY., Disp: 45 tablet, Rfl: 11 .  carvedilol (COREG) 12.5 MG tablet, Take 1 tablet (12.5 mg total) by mouth 2 (two) times daily., Disp: , Rfl:  .  Cholecalciferol (VITAMIN D3) 2000 units TABS, Take 1 tablet by mouth daily., Disp: , Rfl:  .  furosemide (LASIX) 20 MG tablet, Take 1 tablet (20 mg total) by mouth every other day. Make take extra 20 mg tablet once daily as needed for additional swelling/weight gain., Disp: 15 tablet, Rfl: 6 .  lisinopril (PRINIVIL,ZESTRIL) 20 MG tablet, TAKE 1 TABLET BY MOUTH TWICE A DAY, Disp: 60 tablet, Rfl: 10 .  magnesium oxide (MAG-OX) 400 (241.3 Mg) MG tablet, Take 1 tablet (400 mg total) by mouth daily., Disp: 90 tablet, Rfl: 3 .  spironolactone (ALDACTONE) 25 MG tablet, Take 1 tablet (25 mg total) by mouth daily., Disp: 30 tablet, Rfl: 3  Past Medical History: Past Medical History:  Diagnosis Date  . Allergy   . Biventricular ICD -Medtronic    DOI 12/13 - Medtronic Viva XR CRT-D defibrillator, serial #[id number].    . Chronic systolic  heart failure ([place]) 12/02/2011  . Dyslipidemia 12/02/2011  . GERD (gastroesophageal reflux disease)   . Gout    "very seldom" (05/10/2012)  . Hx of colonic polyps   . Hypertension    "used to have this; now my BP is low" (05/10/2012)  . LBBB (left bundle branch block) 12/02/2011  . Nonischemic cardiomyopathy ([place])   . Tubular adenoma 1999   history of    Tobacco Use: History  Smoking Status  . Never Smoker  Smokeless Tobacco  . Never Used    Labs: Recent Review Flowsheet Data    Labs for ITP Cardiac and Pulmonary Rehab Latest Ref Rng & Units 12/01/2011 06/16/2015   Cholestrol 125 - 200 mg/dL 168 182   LDLCALC <130 mg/dL 125(H) 131(H)   HDL >=46 mg/dL 28(L) 32(L)   Trlycerides <150 mg/dL 73 94      Capillary Blood Glucose: No results found for: GLUCAP   Exercise Target Goals:    Exercise Program Goal: Individual exercise prescription set with THRR, safety & activity barriers. Participant demonstrates ability to understand and report RPE using BORG scale, to self-measure pulse accurately, and to acknowledge the importance of the exercise prescription.  Exercise Prescription Goal: Starting with aerobic activity 30 plus minutes a day, 3 days per week for initial exercise prescription. Provide home exercise prescription and guidelines that participant acknowledges understanding prior to discharge.  Activity Barriers &  Risk Stratification:     Activity Barriers & Cardiac Risk Stratification - 10/19/16 1038      Activity Barriers & Cardiac Risk Stratification   Activity Barriers Other (comment);Deconditioning;Muscular Weakness   Comments L sided back; R knee and foot in pain when wearing heels at church   Cardiac Risk Stratification High      6 Minute Walk:     6 Minute Walk    Row Name 10/19/16 0932 10/19/16 1038       6 Minute Walk   Phase Initial  -    Distance 1467 feet  -    Walk Time 6 minutes  -    # of Rest Breaks 0  -    MPH  - 2.78    METS  - 3    RPE  15  -    VO2 Peak  - 10.5    Symptoms No  -    Resting HR  - 85 bpm    Resting BP 101/62  -    Max Ex. HR  - 123 bpm    Max Ex. BP 107/70  -    2 Minute Post BP  - 100/60       Oxygen Initial Assessment:   Oxygen Re-Evaluation:   Oxygen Discharge (Final Oxygen Re-Evaluation):   Initial Exercise Prescription:     Initial Exercise Prescription - 10/19/16 1000      Date of Initial Exercise RX and Referring Provider   Date 10/19/16   Referring Provider Klein,Steven  MD     Bike   Level 0.8   Minutes 10   METs 2.59     NuStep   Level 3   SPM 80   Minutes 10   METs 2     Track   Laps 11   Minutes 10   METs 2.92     Prescription Details   Frequency (times per week) 3   Duration Progress to 30 minutes of continuous aerobic without signs/symptoms of physical distress     Intensity   THRR 40-80% of Max Heartrate 62-125   Ratings of Perceived Exertion 11-13   Perceived Dyspnea 0-4     Progression   Progression Continue to progress workloads to maintain intensity without signs/symptoms of physical distress.     Resistance Training   Training Prescription Yes   Weight 2lbs   Reps 10-15      Perform Capillary Blood Glucose checks as needed.  Exercise Prescription Changes:   Exercise Comments:   Exercise Goals and Review:     Exercise Goals    Row Name 10/19/16 0850             Exercise Goals   Increase Physical Activity Yes       Intervention Provide advice, education, support and counseling about physical activity/exercise needs.;Develop an individualized exercise prescription for aerobic and resistive training based on initial evaluation findings, risk stratification, comorbidities and participant's personal goals.       Expected Outcomes Achievement of increased cardiorespiratory fitness and enhanced flexibility, muscular endurance and strength shown through measurements of functional capacity and personal statement of participant.        Increase Strength and Stamina Yes       Intervention Provide advice, education, support and counseling about physical activity/exercise needs.;Develop an individualized exercise prescription for aerobic and resistive training based on initial evaluation findings, risk stratification, comorbidities and participant's personal goals.       Expected Outcomes Achievement of increased  cardiorespiratory fitness and enhanced flexibility, muscular endurance and strength shown through measurements of functional capacity and personal statement of participant.          Exercise Goals Re-Evaluation :    Discharge Exercise Prescription (Final Exercise Prescription Changes):   Nutrition:  Target Goals: Understanding of nutrition guidelines, daily intake of sodium 1500mg , cholesterol 200mg , calories 30% from fat and 7% or less from saturated fats, daily to have 5 or more servings of fruits and vegetables.  Biometrics:     Pre Biometrics - 10/19/16 0933      Pre Biometrics   Waist Circumference 40.75 inches   Hip Circumference 50 inches   Waist to Hip Ratio 0.82 %   Triceps Skinfold 35 mm   Grip Strength 30 kg   Flexibility 14.5 in   Single Leg Stand 3 seconds       Nutrition Therapy Plan and Nutrition Goals:     Nutrition Therapy & Goals - 10/27/16 1152      Nutrition Therapy   Diet Therapeutic Lifestyle Changes     Personal Nutrition Goals   Nutrition Goal Wt loss goal of 1-2 lb/week to a wt loss goal of 6-24 lb at graduation.     Intervention Plan   Intervention Prescribe, educate and counsel regarding individualized specific dietary modifications aiming towards targeted core components such as weight, hypertension, lipid management, diabetes, heart failure and other comorbidities.   Expected Outcomes Short Term Goal: Understand basic principles of dietary content, such as calories, fat, sodium, cholesterol and nutrients.;Long Term Goal: Adherence to prescribed nutrition plan.       Nutrition Discharge: Nutrition Scores:     Nutrition Assessments - 10/27/16 1152      MEDFICTS Scores   Pre Score 39      Nutrition Goals Re-Evaluation:   Nutrition Goals Re-Evaluation:   Nutrition Goals Discharge (Final Nutrition Goals Re-Evaluation):   Psychosocial: Target Goals: Acknowledge presence or absence of significant depression and/or stress, maximize coping skills, provide positive support system. Participant is able to verbalize types and ability to use techniques and skills needed for reducing stress and depression.  Initial Review & Psychosocial Screening:     Initial Psych Review & Screening - 10/27/16 0752      Family Dynamics   Concerns Recent loss of significant other   Comments pt mother and grandmother passed away in past 2 years.  pt is still experiencing grief.  pt offered counseling with Jeanella Craze.  pt has also previously considered Hospice counseling.  encouraged her to take advantage of these opportunities.       Barriers   Psychosocial barriers to participate in program The patient should benefit from training in stress management and relaxation.     Screening Interventions   Interventions Encouraged to exercise;To provide support and resources with identified psychosocial needs;Provide feedback about the scores to participant      Quality of Life Scores:     Quality of Life - 10/19/16 0949      Quality of Life Scores   Health/Function Pre 21.43 %   Socioeconomic Pre 20.86 %   Psych/Spiritual Pre 22.29 %   Family Pre 22.25 %   GLOBAL Pre 21.59 %      PHQ-9: Recent Review Flowsheet Data    Depression screen [facility] 2/9 10/27/2016   Decreased Interest 0   Down, Depressed, Hopeless 1    PHQ - 2 Score 1     Interpretation of Total Score  Total Score Depression Severity:  1-4 = Minimal depression, 5-9 = Mild depression, 10-14 = Moderate depression, 15-19 = Moderately severe depression, 20-27 = Severe depression   Psychosocial  Evaluation and Intervention:     Psychosocial Evaluation - 10/27/16 1716      Psychosocial Evaluation & Interventions   Interventions Stress management education;Relaxation education;Encouraged to exercise with the program and follow exercise prescription   Comments pt mother and grandmother passed away within past 2 years.  pt displays normal grief pattern. pt offered counseling with Jeanella Craze.     Continue Psychosocial Services  Follow up required by staff      Psychosocial Re-Evaluation:   Psychosocial Discharge (Final Psychosocial Re-Evaluation):   Vocational Rehabilitation: Provide vocational rehab assistance to qualifying candidates.   Vocational Rehab Evaluation & Intervention:     Vocational Rehab - 10/19/16 1114      Initial Vocational Rehab Evaluation & Intervention   Assessment shows need for Vocational Rehabilitation No      Education: Education Goals: Education classes will be provided on a weekly basis, covering required topics. Participant will state understanding/return demonstration of topics presented.  Learning Barriers/Preferences:     Learning Barriers/Preferences - 10/19/16 0849      Learning Barriers/Preferences   Learning Barriers Sight   Learning Preferences Written Material;Video;Verbal Instruction;Skilled Demonstration;Pictoral      Education Topics: Count Your Pulse:  -Group instruction provided by verbal instruction, demonstration, patient participation and written materials to support subject.  Instructors address importance of being able to find your pulse and how to count your pulse when at home without a heart monitor.  Patients get hands on experience counting their pulse with staff help and individually.   Heart Attack, Angina, and Risk Factor Modification:  -Group instruction provided by verbal instruction, video, and written materials to support subject.  Instructors address signs and symptoms of angina and heart attacks.     Also discuss risk factors for heart disease and how to make changes to improve heart health risk factors.   Functional Fitness:  -Group instruction provided by verbal instruction, demonstration, patient participation, and written materials to support subject.  Instructors address safety measures for doing things around the house.  Discuss how to get up and down off the floor, how to pick things up properly, how to safely get out of a chair without assistance, and balance training.   Meditation and Mindfulness:  -Group instruction provided by verbal instruction, patient participation, and written materials to support subject.  Instructor addresses importance of mindfulness and meditation practice to help reduce stress and improve awareness.  Instructor also leads participants through a meditation exercise.    Stretching for Flexibility and Mobility:  -Group instruction provided by verbal instruction, patient participation, and written materials to support subject.  Instructors lead participants through series of stretches that are designed to increase flexibility thus improving mobility.  These stretches are additional exercise for major muscle groups that are typically performed during regular warm up and cool down.   Hands Only CPR:  -Group verbal, video, and participation provides a basic overview of AHA guidelines for community CPR. Role-play of emergencies allow participants the opportunity to practice calling for help and chest compression technique with discussion of AED use.   Hypertension: -Group verbal and written instruction that provides a basic overview of hypertension including the most recent diagnostic guidelines, risk factor reduction with self-care instructions and medication management.    Nutrition I class: Heart Healthy Eating:  -Group instruction provided by PowerPoint slides, verbal discussion, and written  materials to support subject matter. The instructor gives an  explanation and review of the Therapeutic Lifestyle Changes diet recommendations, which includes a discussion on lipid goals, dietary fat, sodium, fiber, plant stanol/sterol esters, sugar, and the components of a well-balanced, healthy diet.   Nutrition II class: Lifestyle Skills:  -Group instruction provided by PowerPoint slides, verbal discussion, and written materials to support subject matter. The instructor gives an explanation and review of label reading, grocery shopping for heart health, heart healthy recipe modifications, and ways to make healthier choices when eating out.   Diabetes Question & Answer:  -Group instruction provided by PowerPoint slides, verbal discussion, and written materials to support subject matter. The instructor gives an explanation and review of diabetes co-morbidities, pre- and post-prandial blood glucose goals, pre-exercise blood glucose goals, signs, symptoms, and treatment of hypoglycemia and hyperglycemia, and foot care basics.   Diabetes Blitz:  -Group instruction provided by PowerPoint slides, verbal discussion, and written materials to support subject matter. The instructor gives an explanation and review of the physiology behind type 1 and type 2 diabetes, diabetes medications and rational behind using different medications, pre- and post-prandial blood glucose recommendations and Hemoglobin A1c goals, diabetes diet, and exercise including blood glucose guidelines for exercising safely.    Portion Distortion:  -Group instruction provided by PowerPoint slides, verbal discussion, written materials, and food models to support subject matter. The instructor gives an explanation of serving size versus portion size, changes in portions sizes over the last 20 years, and what consists of a serving from each food group.   Stress Management:  -Group instruction provided by verbal instruction, video, and written materials to support subject matter.  Instructors  review role of stress in heart disease and how to cope with stress positively.     Exercising on Your Own:  -Group instruction provided by verbal instruction, power point, and written materials to support subject.  Instructors discuss benefits of exercise, components of exercise, frequency and intensity of exercise, and end points for exercise.  Also discuss use of nitroglycerin and activating EMS.  Review options of places to exercise outside of rehab.  Review guidelines for sex with heart disease.   Cardiac Drugs I:  -Group instruction provided by verbal instruction and written materials to support subject.  Instructor reviews cardiac drug classes: antiplatelets, anticoagulants, beta blockers, and statins.  Instructor discusses reasons, side effects, and lifestyle considerations for each drug class.   Cardiac Drugs II:  -Group instruction provided by verbal instruction and written materials to support subject.  Instructor reviews cardiac drug classes: angiotensin converting enzyme inhibitors (ACE-I), angiotensin II receptor blockers (ARBs), nitrates, and calcium channel blockers.  Instructor discusses reasons, side effects, and lifestyle considerations for each drug class.   Anatomy and Physiology of the Circulatory System:  Group verbal and written instruction and models provide basic cardiac anatomy and physiology, with the coronary electrical and arterial systems. Review of: AMI, Angina, Valve disease, Heart Failure, Peripheral Artery Disease, Cardiac Arrhythmia, Pacemakers, and the ICD.   CARDIAC REHAB PHASE II EXERCISE from 10/27/2016 in [place]  Date  10/27/16  Instruction Review Code  2- meets goals/outcomes      Other Education:  -Group or individual verbal, written, or video instructions that support the educational goals of the cardiac rehab program.   Knowledge Questionnaire Score:     Knowledge Questionnaire Score - 10/19/16 0950       Knowledge Questionnaire Score   Pre Score 22/24  Core Components/Risk Factors/Patient Goals at Admission:     Personal Goals and Risk Factors at Admission - 10/19/16 0934      Core Components/Risk Factors/Patient Goals on Admission    Weight Management Yes;Obesity   Intervention Weight Management: Develop a combined nutrition and exercise program designed to reach desired caloric intake, while maintaining appropriate intake of nutrient and fiber, sodium and fats, and appropriate energy expenditure required for the weight goal.;Weight Management: Provide education and appropriate resources to help participant work on and attain dietary goals.;Weight Management/Obesity: Establish reasonable short term and long term weight goals.;Obesity: Provide education and appropriate resources to help participant work on and attain dietary goals.   Admit Weight 208 lb 1.8 oz (94.4 kg)   Goal Weight: Short Term 198 lb (89.8 kg)   Goal Weight: Long Term 150 lb (68 kg)   Expected Outcomes Short Term: Continue to assess and modify interventions until short term weight is achieved;Long Term: Adherence to nutrition and physical activity/exercise program aimed toward attainment of established weight goal;Weight Loss: Understanding of general recommendations for a balanced deficit meal plan, which promotes 1-2 lb weight loss per week and includes a negative energy balance of [phone] kcal/d;Understanding of distribution of calorie intake throughout the day with the consumption of 4-5 meals/snacks;Understanding recommendations for meals to include 15-35% energy as protein, 25-35% energy from fat, 35-60% energy from carbohydrates, less than 200mg  of dietary cholesterol, 20-35 gm of total fiber daily   Heart Failure Yes   Intervention Provide a combined exercise and nutrition program that is supplemented with education, support and counseling about heart failure. Directed toward relieving symptoms such as shortness of  breath, decreased exercise tolerance, and extremity edema.   Expected Outcomes Improve functional capacity of life;Long term: Adoption of self-care skills and reduction of barriers for early signs and symptoms recognition and intervention leading to self-care maintenance.;Short term: Daily weights obtained and reported for increase. Utilizing diuretic protocols set by physician.;Short term: Attendance in program 2-3 days a week with increased exercise capacity. Reported lower sodium intake. Reported increased fruit and vegetable intake. Reports medication compliance.   Hypertension Yes   Intervention Provide education on lifestyle modifcations including regular physical activity/exercise, weight management, moderate sodium restriction and increased consumption of fresh fruit, vegetables, and low fat dairy, alcohol moderation, and smoking cessation.;Monitor prescription use compliance.   Expected Outcomes Short Term: Continued assessment and intervention until BP is < 140/65mm HG in hypertensive participants. < 130/71mm HG in hypertensive participants with diabetes, heart failure or chronic kidney disease.;Long Term: Maintenance of blood pressure at goal levels.   Lipids Yes   Intervention Provide education and support for participant on nutrition & aerobic/resistive exercise along with prescribed medications to achieve LDL 70mg , HDL >40mg .   Expected Outcomes Short Term: Participant states understanding of desired cholesterol values and is compliant with medications prescribed. Participant is following exercise prescription and nutrition guidelines.;Long Term: Cholesterol controlled with medications as prescribed, with individualized exercise RX and with personalized nutrition plan. Value goals: LDL < 70mg , HDL > 40 mg.   Stress Yes   Intervention Offer individual and/or small group education and counseling on adjustment to heart disease, stress management and health-related lifestyle change. Teach and  support self-help strategies.;Refer participants experiencing significant psychosocial distress to appropriate mental health specialists for further evaluation and treatment. When possible, include family members and significant others in education/counseling sessions.   Expected Outcomes Short Term: Participant demonstrates changes in health-related behavior, relaxation and other stress management skills, ability to obtain effective  social support, and compliance with psychotropic medications if prescribed.;Long Term: Emotional wellbeing is indicated by absence of clinically significant psychosocial distress or social isolation.   Personal Goal Other Yes   Personal Goal To be at the appropriate weight for height (healthy BMI range). Learn HH and diabetic nutrition   Intervention Provide nutrition counseling and exercise programming to assist with weightloss goals and improve dietary habits   Expected Outcomes Pt will have a better understanding of diabetes, learn HH diet, develop an exercise routine to assist with losing weight.      Core Components/Risk Factors/Patient Goals Review:    Core Components/Risk Factors/Patient Goals at Discharge (Final Review):    ITP Comments:     ITP Comments    Row Name 10/19/16 0835           ITP Comments Dr. Fransico Him, Medical Director           Comments: Pt started cardiac rehab today.  Pt tolerated light exercise without difficulty. VSS, telemetry-sinus rhythm,  asymptomatic.  Medication list reconciled. Pt denies barriers to medicaiton compliance.  PSYCHOSOCIAL ASSESSMENT:  PHQ-1. Pt exhibits positive coping skills, hopeful outlook with supportive family. No psychosocial needs identified at this time, no psychosocial interventions necessary.    Pt enjoys reading, talking and listening to people. 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 Pt goals for cardiac rehab are to lose weight. Pt encouraged to participate in  nutrition education opportunities to help with this effort.    Pt oriented to exercise equipment and routine.    Understanding verbalized.

## 2016-10-29 ENCOUNTER — Encounter (HOSPITAL_COMMUNITY)
Admission: RE | Admit: 2016-10-29 | Discharge: 2016-10-29 | Disposition: A | Discharge status: Home / Self Care | Payer: BC Managed Care – PPO | Source: Ambulatory Visit | Attending: Internal Medicine | Admitting: Internal Medicine

## 2016-10-29 DIAGNOSIS — I5022 Chronic systolic (congestive) heart failure: Secondary | ICD-10-CM

## 2016-11-01 ENCOUNTER — Encounter (HOSPITAL_COMMUNITY): Payer: BC Managed Care – PPO

## 2016-11-03 ENCOUNTER — Encounter (HOSPITAL_COMMUNITY)
Admission: RE | Admit: 2016-11-03 | Discharge: 2016-11-03 | Disposition: A | Discharge status: Home / Self Care | Payer: BC Managed Care – PPO | Source: Ambulatory Visit | Attending: Internal Medicine | Admitting: Internal Medicine

## 2016-11-03 ENCOUNTER — Other Ambulatory Visit: Payer: Self-pay | Admitting: Internal Medicine

## 2016-11-03 ENCOUNTER — Telehealth (HOSPITAL_COMMUNITY): Payer: Self-pay | Admitting: *Deleted

## 2016-11-03 DIAGNOSIS — I5022 Chronic systolic (congestive) heart failure: Secondary | ICD-10-CM

## 2016-11-03 NOTE — Telephone Encounter (Signed)
OK for SBP 90 and above.

## 2016-11-03 NOTE — Telephone Encounter (Signed)
JoAnne from [facility] called to report that patient's BP this morning prior to session was 84/60.  After drinking Gatorade it was 91/61. Patient was asymptomatic and completed rehab with BP of 89/57.  JoAnne wanted Dr. [name] to be aware and to provide parameters for patient when she is at [facility]. Will route message to Dr. [name].

## 2016-11-03 NOTE — Progress Notes (Signed)
Pt arrived at cardiac rehab hypotensive, BP:  84/60.  Pt asymptomatic.  Pt given gatorade, recheck BP:  91/61.  Pt tolerated exercise without difficulty. Peak BP:  124/70.  Post BP:  89/57.  PC to Dr. Claris Gladden office.  Susie will review with Dr. [name] and  advise pt.

## 2016-11-04 NOTE — Telephone Encounter (Signed)
Called and spoke with Thayer Headings in [place] and she is aware of SBP parameters for patient. No further questions at this time.

## 2016-11-05 ENCOUNTER — Encounter (HOSPITAL_COMMUNITY)
Admission: RE | Admit: 2016-11-05 | Discharge: 2016-11-05 | Disposition: A | Discharge status: Home / Self Care | Payer: BC Managed Care – PPO | Source: Ambulatory Visit | Attending: Internal Medicine | Admitting: Internal Medicine

## 2016-11-05 DIAGNOSIS — I5022 Chronic systolic (congestive) heart failure: Secondary | ICD-10-CM

## 2016-11-08 ENCOUNTER — Encounter (HOSPITAL_COMMUNITY)
Admission: RE | Admit: 2016-11-08 | Discharge: 2016-11-08 | Disposition: A | Discharge status: Home / Self Care | Payer: BC Managed Care – PPO | Source: Ambulatory Visit | Attending: Internal Medicine | Admitting: Internal Medicine

## 2016-11-08 DIAGNOSIS — I5022 Chronic systolic (congestive) heart failure: Secondary | ICD-10-CM | POA: Diagnosis not present

## 2016-11-08 NOTE — Progress Notes (Signed)
Reviewed home exercise with pt.  Discussed mode/frequency of exercise, target heart rate range, RPE scale and weather conditions for exercising outdoors.  Also discussed signs and symptoms and when to call 911.  Pt verbalized understanding.   Cleda Mccreedy, MS ACSM RCEP 11/08/2016 9:41

## 2016-11-10 ENCOUNTER — Encounter (HOSPITAL_COMMUNITY)
Admission: RE | Admit: 2016-11-10 | Discharge: 2016-11-10 | Disposition: A | Discharge status: Home / Self Care | Payer: BC Managed Care – PPO | Source: Ambulatory Visit | Attending: Internal Medicine | Admitting: Internal Medicine

## 2016-11-10 DIAGNOSIS — I5022 Chronic systolic (congestive) heart failure: Secondary | ICD-10-CM | POA: Diagnosis not present

## 2016-11-12 ENCOUNTER — Encounter (HOSPITAL_COMMUNITY)
Admission: RE | Admit: 2016-11-12 | Discharge: 2016-11-12 | Disposition: A | Discharge status: Home / Self Care | Payer: BC Managed Care – PPO | Source: Ambulatory Visit | Attending: Internal Medicine | Admitting: Internal Medicine

## 2016-11-12 DIAGNOSIS — I5022 Chronic systolic (congestive) heart failure: Secondary | ICD-10-CM | POA: Diagnosis not present

## 2016-11-15 ENCOUNTER — Encounter (HOSPITAL_COMMUNITY)
Admission: RE | Admit: 2016-11-15 | Discharge: 2016-11-15 | Disposition: A | Discharge status: Home / Self Care | Payer: BC Managed Care – PPO | Source: Ambulatory Visit | Attending: Internal Medicine | Admitting: Internal Medicine

## 2016-11-15 DIAGNOSIS — I5022 Chronic systolic (congestive) heart failure: Secondary | ICD-10-CM | POA: Diagnosis not present

## 2016-11-16 NOTE — Progress Notes (Signed)
Cardiac Individual Treatment Plan  Patient Details  Name: Cynthia Roth MRN: [id number] Date of Birth: 12/26/1950 Referring Provider:     CARDIAC REHAB PHASE II ORIENTATION from 10/19/2016 in [place]  Referring Provider  Klein,Steven  MD      Initial Encounter Date:    CARDIAC REHAB PHASE II ORIENTATION from 10/19/2016 in [place]  Date  10/19/16  Referring Provider  Klein,Steven  MD      Visit Diagnosis: Chronic systolic congestive heart failure ([place])  Patient's Home Medications on Admission:  Current Outpatient Prescriptions:  .  aspirin 81 MG tablet, Take 81 mg by mouth daily., Disp: , Rfl:  .  atorvastatin (LIPITOR) 20 MG tablet, Take 1 tablet (20 mg total) by mouth daily., Disp: 90 tablet, Rfl: 3 .  B COMPLEX VITAMINS SL, Place 1 drop under the tongue daily., Disp: , Rfl:  .  BIDIL 20-37.5 MG tablet, TAKE (1/2) TABLET THREE TIMES DAILY., Disp: 45 tablet, Rfl: 11 .  carvedilol (COREG) 12.5 MG tablet, Take 1 tablet (12.5 mg total) by mouth 2 (two) times daily., Disp: , Rfl:  .  Cholecalciferol (VITAMIN D3) 2000 units TABS, Take 1 tablet by mouth daily., Disp: , Rfl:  .  furosemide (LASIX) 20 MG tablet, Take 1 tablet (20 mg total) by mouth every other day. Make take extra 20 mg tablet once daily as needed for additional swelling/weight gain., Disp: 15 tablet, Rfl: 6 .  lisinopril (PRINIVIL,ZESTRIL) 20 MG tablet, TAKE 1 TABLET BY MOUTH TWICE A DAY, Disp: 60 tablet, Rfl: 10 .  magnesium oxide (MAG-OX) 400 (241.3 Mg) MG tablet, Take 1 tablet (400 mg total) by mouth daily., Disp: 90 tablet, Rfl: 3 .  spironolactone (ALDACTONE) 25 MG tablet, Take 1 tablet (25 mg total) by mouth daily., Disp: 30 tablet, Rfl: 3  Past Medical History: Past Medical History:  Diagnosis Date  . Allergy   . Biventricular ICD -Med[employer]    DOI 12/13 - Medtronic Viva XR CRT-D defibrillator, serial #[id number].    . Chronic systolic  heart failure ([place]) 12/02/2011  . Dyslipidemia 12/02/2011  . GERD (gastroesophageal reflux disease)   . Gout    "very seldom" (05/10/2012)  . Hx of colonic polyps   . Hypertension    "used to have this; now my BP is low" (05/10/2012)  . LBBB (left bundle branch block) 12/02/2011  . Nonischemic cardiomyopathy ([place])   . Tubular adenoma 1999   history of    Tobacco Use: History  Smoking Status  . Never Smoker  Smokeless Tobacco  . Never Used    Labs: Recent Review Flowsheet Data    Labs for ITP Cardiac and Pulmonary Rehab Latest Ref Rng & Units 12/01/2011 06/16/2015   Cholestrol 125 - 200 mg/dL 168 182   LDLCALC <130 mg/dL 125(H) 131(H)   HDL >=46 mg/dL 28(L) 32(L)   Trlycerides <150 mg/dL 73 94      Capillary Blood Glucose: No results found for: GLUCAP   Exercise Target Goals:    Exercise Program Goal: Individual exercise prescription set with THRR, safety & activity barriers. Participant demonstrates ability to understand and report RPE using BORG scale, to self-measure pulse accurately, and to acknowledge the importance of the exercise prescription.  Exercise Prescription Goal: Starting with aerobic activity 30 plus minutes a day, 3 days per week for initial exercise prescription. Provide home exercise prescription and guidelines that participant acknowledges understanding prior to discharge.  Activity Barriers &  Risk Stratification:     Activity Barriers & Cardiac Risk Stratification - 10/19/16 1038      Activity Barriers & Cardiac Risk Stratification   Activity Barriers Other (comment);Deconditioning;Muscular Weakness   Comments L sided back; R knee and foot in pain when wearing heels at church   Cardiac Risk Stratification High      6 Minute Walk:     6 Minute Walk    Row Name 10/19/16 0932 10/19/16 1038       6 Minute Walk   Phase Initial  -    Distance 1467 feet  -    Walk Time 6 minutes  -    # of Rest Breaks 0  -    MPH  - 2.78    METS  - 3    RPE  15  -    VO2 Peak  - 10.5    Symptoms No  -    Resting HR  - 85 bpm    Resting BP 101/62  -    Max Ex. HR  - 123 bpm    Max Ex. BP 107/70  -    2 Minute Post BP  - 100/60       Oxygen Initial Assessment:   Oxygen Re-Evaluation:   Oxygen Discharge (Final Oxygen Re-Evaluation):   Initial Exercise Prescription:     Initial Exercise Prescription - 10/19/16 1000      Date of Initial Exercise RX and Referring Provider   Date 10/19/16   Referring Provider Klein,Steven  MD     Bike   Level 0.8   Minutes 10   METs 2.59     NuStep   Level 3   SPM 80   Minutes 10   METs 2     Track   Laps 11   Minutes 10   METs 2.92     Prescription Details   Frequency (times per week) 3   Duration Progress to 30 minutes of continuous aerobic without signs/symptoms of physical distress     Intensity   THRR 40-80% of Max Heartrate 62-125   Ratings of Perceived Exertion 11-13   Perceived Dyspnea 0-4     Progression   Progression Continue to progress workloads to maintain intensity without signs/symptoms of physical distress.     Resistance Training   Training Prescription Yes   Weight 2lbs   Reps 10-15      Perform Capillary Blood Glucose checks as needed.  Exercise Prescription Changes:     Exercise Prescription Changes    Row Name 11/08/16 1700             Response to Exercise   Blood Pressure (Admit) 107/68       Blood Pressure (Exercise) 108/70       Blood Pressure (Exit) 92/52       Heart Rate (Admit) 95 bpm       Heart Rate (Exercise) 123 bpm       Heart Rate (Exit) 93 bpm       Rating of Perceived Exertion (Exercise) 14       Duration Progress to 45 minutes of aerobic exercise without signs/symptoms of physical distress       Intensity THRR unchanged         Progression   Progression Continue to progress workloads to maintain intensity without signs/symptoms of physical distress.       Average METs 2.6         Resistance Training  Training  Prescription Yes       Weight 2lbs       Reps 10-15         Bike   Level 0.8       Minutes 10       METs 2.6         NuStep   Level 3       SPM 80       Minutes 10       METs 2.4         Track   Laps 14       Minutes 10       METs 3.43         Home Exercise Plan   Plans to continue exercise at Home (comment)       Frequency Add 3 additional days to program exercise sessions.       Initial Home Exercises Provided 11/08/16          Exercise Comments:     Exercise Comments    Row Name 11/15/16 0907           Exercise Comments reviewed METs and goals with pt.            Exercise Goals and Review:     Exercise Goals    Row Name 10/19/16 0850             Exercise Goals   Increase Physical Activity Yes       Intervention Provide advice, education, support and counseling about physical activity/exercise needs.;Develop an individualized exercise prescription for aerobic and resistive training based on initial evaluation findings, risk stratification, comorbidities and participant's personal goals.       Expected Outcomes Achievement of increased cardiorespiratory fitness and enhanced flexibility, muscular endurance and strength shown through measurements of functional capacity and personal statement of participant.       Increase Strength and Stamina Yes       Intervention Provide advice, education, support and counseling about physical activity/exercise needs.;Develop an individualized exercise prescription for aerobic and resistive training based on initial evaluation findings, risk stratification, comorbidities and participant's personal goals.       Expected Outcomes Achievement of increased cardiorespiratory fitness and enhanced flexibility, muscular endurance and strength shown through measurements of functional capacity and personal statement of participant.          Exercise Goals Re-Evaluation :     Exercise Goals Re-Evaluation    Row Name 11/15/16 0905              Exercise Goal Re-Evaluation   Exercise Goals Review Increase Physical Activity;Increase Strenth and Stamina       Comments pt states that she is still acclimating to an exercise routine but she is doing well with exercise thus far.        Expected Outcomes Continue with exercise Rx and increase workloads as tolerated and as pt gets stronger she will begin to feel more comfortable with exercise.            Discharge Exercise Prescription (Final Exercise Prescription Changes):     Exercise Prescription Changes - 11/08/16 1700      Response to Exercise   Blood Pressure (Admit) 107/68   Blood Pressure (Exercise) 108/70   Blood Pressure (Exit) 92/52   Heart Rate (Admit) 95 bpm   Heart Rate (Exercise) 123 bpm   Heart Rate (Exit) 93 bpm   Rating of Perceived Exertion (Exercise) 14   Duration Progress  to 45 minutes of aerobic exercise without signs/symptoms of physical distress   Intensity THRR unchanged     Progression   Progression Continue to progress workloads to maintain intensity without signs/symptoms of physical distress.   Average METs 2.6     Resistance Training   Training Prescription Yes   Weight 2lbs   Reps 10-15     Bike   Level 0.8   Minutes 10   METs 2.6     NuStep   Level 3   SPM 80   Minutes 10   METs 2.4     Track   Laps 14   Minutes 10   METs 3.43     Home Exercise Plan   Plans to continue exercise at Home (comment)   Frequency Add 3 additional days to program exercise sessions.   Initial Home Exercises Provided 11/08/16      Nutrition:  Target Goals: Understanding of nutrition guidelines, daily intake of sodium 1500mg , cholesterol 200mg , calories 30% from fat and 7% or less from saturated fats, daily to have 5 or more servings of fruits and vegetables.  Biometrics:     Pre Biometrics - 10/19/16 0933      Pre Biometrics   Waist Circumference 40.75 inches   Hip Circumference 50 inches   Waist to Hip Ratio 0.82 %    Triceps Skinfold 35 mm   Grip Strength 30 kg   Flexibility 14.5 in   Single Leg Stand 3 seconds       Nutrition Therapy Plan and Nutrition Goals:     Nutrition Therapy & Goals - 10/27/16 1152      Nutrition Therapy   Diet Therapeutic Lifestyle Changes     Personal Nutrition Goals   Nutrition Goal Wt loss goal of 1-2 lb/week to a wt loss goal of 6-24 lb at graduation.     Intervention Plan   Intervention Prescribe, educate and counsel regarding individualized specific dietary modifications aiming towards targeted core components such as weight, hypertension, lipid management, diabetes, heart failure and other comorbidities.   Expected Outcomes Short Term Goal: Understand basic principles of dietary content, such as calories, fat, sodium, cholesterol and nutrients.;Long Term Goal: Adherence to prescribed nutrition plan.      Nutrition Discharge: Nutrition Scores:     Nutrition Assessments - 10/27/16 1152      MEDFICTS Scores   Pre Score 39      Nutrition Goals Re-Evaluation:   Nutrition Goals Re-Evaluation:   Nutrition Goals Discharge (Final Nutrition Goals Re-Evaluation):   Psychosocial: Target Goals: Acknowledge presence or absence of significant depression and/or stress, maximize coping skills, provide positive support system. Participant is able to verbalize types and ability to use techniques and skills needed for reducing stress and depression.  Initial Review & Psychosocial Screening:     Initial Psych Review & Screening - 10/27/16 0752      Family Dynamics   Concerns Recent loss of significant other   Comments pt mother and grandmother passed away in past 2 years.  pt is still experiencing grief.  pt offered counseling with Jeanella Craze.  pt has also previously considered Hospice counseling.  encouraged her to take advantage of these opportunities.       Barriers   Psychosocial barriers to participate in program The patient should benefit from training  in stress management and relaxation.     Screening Interventions   Interventions Encouraged to exercise;To provide support and resources with identified psychosocial needs;Provide feedback about the scores to  participant      Quality of Life Scores:     Quality of Life - 11/10/16 0828      Quality of Life Scores   Health/Function Pre 21.43 %  pt with health related anxiety about recent cardiac event.  pt concerned about fatigue and lack of energy. pt counseled on continuing current regimen including exercise.  will continue to monitor BP and symptoms. will report to Dr. [name] accordingly.   Socioeconomic Pre 20.86 %  pt with stressful job looking forward to retirement soon.     Psych/Spiritual Pre 22.29 %   Family Pre 22.25 %   GLOBAL Pre 21.59 %  pt offered emotional support and reassurance.  pt encouraged to continue exercise and stress management education.        PHQ-9: Recent Review Flowsheet Data    Depression screen [facility] 2/9 10/27/2016   Decreased Interest 0   Down, Depressed, Hopeless 1    PHQ - 2 Score 1     Interpretation of Total Score  Total Score Depression Severity:  1-4 = Minimal depression, 5-9 = Mild depression, 10-14 = Moderate depression, 15-19 = Moderately severe depression, 20-27 = Severe depression   Psychosocial Evaluation and Intervention:     Psychosocial Evaluation - 10/27/16 1716      Psychosocial Evaluation & Interventions   Interventions Stress management education;Relaxation education;Encouraged to exercise with the program and follow exercise prescription   Comments pt mother and grandmother passed away within past 2 years.  pt displays normal grief pattern. pt offered counseling with Jeanella Craze.     Continue Psychosocial Services  Follow up required by staff      Psychosocial Re-Evaluation:     Psychosocial Re-Evaluation    [place] Name 11/16/16 [phone]             Psychosocial Re-Evaluation   Current issues with Current Stress  Concerns       Comments pt with normal grief pattern from recent loss of mother and grandmother. with continued CR participation pt mood and outlook are improving. pt is looking forward to retirement.        Expected Outcomes pt will exhibit positive outlook with good coping skills and normal grief pattern.        Interventions Stress management education;Relaxation education;Encouraged to attend Cardiac Rehabilitation for the exercise       Continue Psychosocial Services  Follow up required by staff          Psychosocial Discharge (Final Psychosocial Re-Evaluation):     Psychosocial Re-Evaluation - 11/16/16 0724      Psychosocial Re-Evaluation   Current issues with Current Stress Concerns   Comments pt with normal grief pattern from recent loss of mother and grandmother. with continued CR participation pt mood and outlook are improving. pt is looking forward to retirement.    Expected Outcomes pt will exhibit positive outlook with good coping skills and normal grief pattern.    Interventions Stress management education;Relaxation education;Encouraged to attend Cardiac Rehabilitation for the exercise   Continue Psychosocial Services  Follow up required by staff      Vocational Rehabilitation: Provide vocational rehab assistance to qualifying candidates.   Vocational Rehab Evaluation & Intervention:     Vocational Rehab - 10/19/16 1114      Initial Vocational Rehab Evaluation & Intervention   Assessment shows need for Vocational Rehabilitation No      Education: Education Goals: Education classes will be provided on a weekly basis, covering required topics. Participant will  state understanding/return demonstration of topics presented.  Learning Barriers/Preferences:     Learning Barriers/Preferences - 10/19/16 0849      Learning Barriers/Preferences   Learning Barriers Sight   Learning Preferences Written Material;Video;Verbal Instruction;Skilled Demonstration;Pictoral       Education Topics: Count Your Pulse:  -Group instruction provided by verbal instruction, demonstration, patient participation and written materials to support subject.  Instructors address importance of being able to find your pulse and how to count your pulse when at home without a heart monitor.  Patients get hands on experience counting their pulse with staff help and individually.   Heart Attack, Angina, and Risk Factor Modification:  -Group instruction provided by verbal instruction, video, and written materials to support subject.  Instructors address signs and symptoms of angina and heart attacks.    Also discuss risk factors for heart disease and how to make changes to improve heart health risk factors.   Functional Fitness:  -Group instruction provided by verbal instruction, demonstration, patient participation, and written materials to support subject.  Instructors address safety measures for doing things around the house.  Discuss how to get up and down off the floor, how to pick things up properly, how to safely get out of a chair without assistance, and balance training.   Meditation and Mindfulness:  -Group instruction provided by verbal instruction, patient participation, and written materials to support subject.  Instructor addresses importance of mindfulness and meditation practice to help reduce stress and improve awareness.  Instructor also leads participants through a meditation exercise.    Stretching for Flexibility and Mobility:  -Group instruction provided by verbal instruction, patient participation, and written materials to support subject.  Instructors lead participants through series of stretches that are designed to increase flexibility thus improving mobility.  These stretches are additional exercise for major muscle groups that are typically performed during regular warm up and cool down.   Hands Only CPR:  -Group verbal, video, and participation provides a  basic overview of AHA guidelines for community CPR. Role-play of emergencies allow participants the opportunity to practice calling for help and chest compression technique with discussion of AED use.   Hypertension: -Group verbal and written instruction that provides a basic overview of hypertension including the most recent diagnostic guidelines, risk factor reduction with self-care instructions and medication management.    Nutrition I class: Heart Healthy Eating:  -Group instruction provided by PowerPoint slides, verbal discussion, and written materials to support subject matter. The instructor gives an explanation and review of the Therapeutic Lifestyle Changes diet recommendations, which includes a discussion on lipid goals, dietary fat, sodium, fiber, plant stanol/sterol esters, sugar, and the components of a well-balanced, healthy diet.   Nutrition II class: Lifestyle Skills:  -Group instruction provided by PowerPoint slides, verbal discussion, and written materials to support subject matter. The instructor gives an explanation and review of label reading, grocery shopping for heart health, heart healthy recipe modifications, and ways to make healthier choices when eating out.   Diabetes Question & Answer:  -Group instruction provided by PowerPoint slides, verbal discussion, and written materials to support subject matter. The instructor gives an explanation and review of diabetes co-morbidities, pre- and post-prandial blood glucose goals, pre-exercise blood glucose goals, signs, symptoms, and treatment of hypoglycemia and hyperglycemia, and foot care basics.   Diabetes Blitz:  -Group instruction provided by PowerPoint slides, verbal discussion, and written materials to support subject matter. The instructor gives an explanation and review of the physiology behind type 1 and type  2 diabetes, diabetes medications and rational behind using different medications, pre- and post-prandial  blood glucose recommendations and Hemoglobin A1c goals, diabetes diet, and exercise including blood glucose guidelines for exercising safely.    Portion Distortion:  -Group instruction provided by PowerPoint slides, verbal discussion, written materials, and food models to support subject matter. The instructor gives an explanation of serving size versus portion size, changes in portions sizes over the last 20 years, and what consists of a serving from each food group.   Stress Management:  -Group instruction provided by verbal instruction, video, and written materials to support subject matter.  Instructors review role of stress in heart disease and how to cope with stress positively.     Exercising on Your Own:  -Group instruction provided by verbal instruction, power point, and written materials to support subject.  Instructors discuss benefits of exercise, components of exercise, frequency and intensity of exercise, and end points for exercise.  Also discuss use of nitroglycerin and activating EMS.  Review options of places to exercise outside of rehab.  Review guidelines for sex with heart disease.   Cardiac Drugs I:  -Group instruction provided by verbal instruction and written materials to support subject.  Instructor reviews cardiac drug classes: antiplatelets, anticoagulants, beta blockers, and statins.  Instructor discusses reasons, side effects, and lifestyle considerations for each drug class.   Cardiac Drugs II:  -Group instruction provided by verbal instruction and written materials to support subject.  Instructor reviews cardiac drug classes: angiotensin converting enzyme inhibitors (ACE-I), angiotensin II receptor blockers (ARBs), nitrates, and calcium channel blockers.  Instructor discusses reasons, side effects, and lifestyle considerations for each drug class.   Anatomy and Physiology of the Circulatory System:  Group verbal and written instruction and models provide basic  cardiac anatomy and physiology, with the coronary electrical and arterial systems. Review of: AMI, Angina, Valve disease, Heart Failure, Peripheral Artery Disease, Cardiac Arrhythmia, Pacemakers, and the ICD.   CARDIAC REHAB PHASE II EXERCISE from 10/27/2016 in [place]  Date  10/27/16  Instruction Review Code  2- meets goals/outcomes      Other Education:  -Group or individual verbal, written, or video instructions that support the educational goals of the cardiac rehab program.   Knowledge Questionnaire Score:     Knowledge Questionnaire Score - 10/19/16 0950      Knowledge Questionnaire Score   Pre Score 22/24      Core Components/Risk Factors/Patient Goals at Admission:     Personal Goals and Risk Factors at Admission - 10/19/16 0934      Core Components/Risk Factors/Patient Goals on Admission    Weight Management Yes;Obesity   Intervention Weight Management: Develop a combined nutrition and exercise program designed to reach desired caloric intake, while maintaining appropriate intake of nutrient and fiber, sodium and fats, and appropriate energy expenditure required for the weight goal.;Weight Management: Provide education and appropriate resources to help participant work on and attain dietary goals.;Weight Management/Obesity: Establish reasonable short term and long term weight goals.;Obesity: Provide education and appropriate resources to help participant work on and attain dietary goals.   Admit Weight 208 lb 1.8 oz (94.4 kg)   Goal Weight: Short Term 198 lb (89.8 kg)   Goal Weight: Long Term 150 lb (68 kg)   Expected Outcomes Short Term: Continue to assess and modify interventions until short term weight is achieved;Long Term: Adherence to nutrition and physical activity/exercise program aimed toward attainment of established weight goal;Weight Loss: Understanding of general  recommendations for a balanced deficit meal plan, which promotes  1-2 lb weight loss per week and includes a negative energy balance of [phone] kcal/d;Understanding of distribution of calorie intake throughout the day with the consumption of 4-5 meals/snacks;Understanding recommendations for meals to include 15-35% energy as protein, 25-35% energy from fat, 35-60% energy from carbohydrates, less than 200mg  of dietary cholesterol, 20-35 gm of total fiber daily   Heart Failure Yes   Intervention Provide a combined exercise and nutrition program that is supplemented with education, support and counseling about heart failure. Directed toward relieving symptoms such as shortness of breath, decreased exercise tolerance, and extremity edema.   Expected Outcomes Improve functional capacity of life;Long term: Adoption of self-care skills and reduction of barriers for early signs and symptoms recognition and intervention leading to self-care maintenance.;Short term: Daily weights obtained and reported for increase. Utilizing diuretic protocols set by physician.;Short term: Attendance in program 2-3 days a week with increased exercise capacity. Reported lower sodium intake. Reported increased fruit and vegetable intake. Reports medication compliance.   Hypertension Yes   Intervention Provide education on lifestyle modifcations including regular physical activity/exercise, weight management, moderate sodium restriction and increased consumption of fresh fruit, vegetables, and low fat dairy, alcohol moderation, and smoking cessation.;Monitor prescription use compliance.   Expected Outcomes Short Term: Continued assessment and intervention until BP is < 140/94mm HG in hypertensive participants. < 130/43mm HG in hypertensive participants with diabetes, heart failure or chronic kidney disease.;Long Term: Maintenance of blood pressure at goal levels.   Lipids Yes   Intervention Provide education and support for participant on nutrition & aerobic/resistive exercise along with prescribed  medications to achieve LDL 70mg , HDL >40mg .   Expected Outcomes Short Term: Participant states understanding of desired cholesterol values and is compliant with medications prescribed. Participant is following exercise prescription and nutrition guidelines.;Long Term: Cholesterol controlled with medications as prescribed, with individualized exercise RX and with personalized nutrition plan. Value goals: LDL < 70mg , HDL > 40 mg.   Stress Yes   Intervention Offer individual and/or small group education and counseling on adjustment to heart disease, stress management and health-related lifestyle change. Teach and support self-help strategies.;Refer participants experiencing significant psychosocial distress to appropriate mental health specialists for further evaluation and treatment. When possible, include family members and significant others in education/counseling sessions.   Expected Outcomes Short Term: Participant demonstrates changes in health-related behavior, relaxation and other stress management skills, ability to obtain effective social support, and compliance with psychotropic medications if prescribed.;Long Term: Emotional wellbeing is indicated by absence of clinically significant psychosocial distress or social isolation.   Personal Goal Other Yes   Personal Goal To be at the appropriate weight for height (healthy BMI range). Learn HH and diabetic nutrition   Intervention Provide nutrition counseling and exercise programming to assist with weightloss goals and improve dietary habits   Expected Outcomes Pt will have a better understanding of diabetes, learn HH diet, develop an exercise routine to assist with losing weight.      Core Components/Risk Factors/Patient Goals Review:      Goals and Risk Factor Review    Row Name 11/09/16 1629             Core Components/Risk Factors/Patient Goals Review   Personal Goals Review Weight Management/Obesity;Heart  Failure;Hypertension;Lipids;Stress       Review pt has not developed HEP.  However, pt does note increased energy and improved symptoms with M, W, F CR exercise.        Expected  Outcomes pt will continue CR exercise, nutrition, and lifestyle modification education to promote weight loss and reduce overall CAD risk factors.            Core Components/Risk Factors/Patient Goals at Discharge (Final Review):      Goals and Risk Factor Review - 11/09/16 1629      Core Components/Risk Factors/Patient Goals Review   Personal Goals Review Weight Management/Obesity;Heart Failure;Hypertension;Lipids;Stress   Review pt has not developed HEP.  However, pt does note increased energy and improved symptoms with M, W, F CR exercise.    Expected Outcomes pt will continue CR exercise, nutrition, and lifestyle modification education to promote weight loss and reduce overall CAD risk factors.        ITP Comments:     ITP Comments    Row Name 10/19/16 [phone] 11/16/16 0724         ITP Comments Dr. Fransico Him, Medical Director  Dr. Fransico Him, Medical Director          Comments: Pt is making expected progress toward personal goals after completing 8 sessions. Recommend continued exercise and life style modification education including  stress management and relaxation techniques to decrease cardiac risk profile.

## 2016-11-17 ENCOUNTER — Encounter (HOSPITAL_COMMUNITY)
Admission: RE | Admit: 2016-11-17 | Discharge: 2016-11-17 | Disposition: A | Discharge status: Home / Self Care | Payer: BC Managed Care – PPO | Source: Ambulatory Visit | Attending: Internal Medicine | Admitting: Internal Medicine

## 2016-11-17 DIAGNOSIS — I5022 Chronic systolic (congestive) heart failure: Secondary | ICD-10-CM

## 2016-11-19 ENCOUNTER — Encounter (HOSPITAL_COMMUNITY): Payer: BC Managed Care – PPO

## 2016-11-22 ENCOUNTER — Encounter (HOSPITAL_COMMUNITY): Payer: BC Managed Care – PPO

## 2016-11-23 ENCOUNTER — Other Ambulatory Visit: Payer: Self-pay | Admitting: Internal Medicine

## 2016-11-24 ENCOUNTER — Telehealth (HOSPITAL_COMMUNITY): Payer: Self-pay | Admitting: Cardiac Rehabilitation

## 2016-11-24 ENCOUNTER — Encounter (HOSPITAL_COMMUNITY): Payer: BC Managed Care – PPO

## 2016-11-24 NOTE — Telephone Encounter (Signed)
pc to pt to assess reason for continued absence from cardiac rehab . Pt c/o increased knee pain and inadequate sleep pattern.  Pt has scheduled appt with her PCP to evaluate.  Pt hopes to return to card[facility] rehab on next scheduled appt.

## 2016-11-26 ENCOUNTER — Encounter (HOSPITAL_COMMUNITY)
Admission: RE | Admit: 2016-11-26 | Discharge: 2016-11-26 | Disposition: A | Discharge status: Home / Self Care | Payer: BC Managed Care – PPO | Source: Ambulatory Visit | Attending: Internal Medicine | Admitting: Internal Medicine

## 2016-11-26 DIAGNOSIS — I5022 Chronic systolic (congestive) heart failure: Secondary | ICD-10-CM

## 2016-11-26 NOTE — Progress Notes (Signed)
Cynthia Roth 66 y.o. female Nutrition Note Spoke with pt. Nutrition Plan and Nutrition Survey goals reviewed with pt. Pt is following Step 1 of the Therapeutic Lifestyle Changes diet. Pt with dx of CHF. Per discussion, pt does not use canned/convenience foods often and is aware of the need to monitor her sodium intake. Pt expressed understanding of the information reviewed. Pt aware of nutrition education classes offered and is unable to attend nutrition classes due to work schedule.  No results found for: HGBA1C Wt Readings from Last 3 Encounters:  10/19/16 208 lb 1.8 oz (94.4 kg)  09/30/16 208 lb 6 oz (94.5 kg)  09/01/16 204 lb 8 oz (92.8 kg)    Nutrition Diagnosis ? Food-and nutrition-related knowledge deficit related to lack of exposure to information as related to diagnosis of: ? CHF ? Obesity related to excessive energy intake as evidenced by a BMI of 36.4  Nutrition Intervention ? Pt's individual nutrition plan reviewed with pt. ? Benefits of adopting Therapeutic Lifestyle Changes discussed when [employer] reviewed.   ? Pt given handouts for: ? Nutrition I class ? Nutrition II class  ? Continue client-centered nutrition education by RD, as part of interdisciplinary care.  Goal(s) ? Pt to identify and limit food sources of saturated fat, trans fat, and sodium ? Pt to identify food quantities necessary to achieve weight loss of 6-24 lb (2.7-10.9 kg) at graduation from cardiac rehab.   Derek Mound, M.Ed, RD, LDN, CDE 11/26/2016 8:15 AM

## 2016-11-29 ENCOUNTER — Encounter (HOSPITAL_COMMUNITY)
Admission: RE | Admit: 2016-11-29 | Discharge: 2016-11-29 | Disposition: A | Discharge status: Home / Self Care | Payer: BC Managed Care – PPO | Source: Ambulatory Visit | Attending: Internal Medicine | Admitting: Internal Medicine

## 2016-11-29 DIAGNOSIS — I5022 Chronic systolic (congestive) heart failure: Secondary | ICD-10-CM | POA: Diagnosis present

## 2016-12-02 ENCOUNTER — Ambulatory Visit (INDEPENDENT_AMBULATORY_CARE_PROVIDER_SITE_OTHER): Payer: BC Managed Care – PPO | Admitting: *Deleted

## 2016-12-02 DIAGNOSIS — I5022 Chronic systolic (congestive) heart failure: Secondary | ICD-10-CM

## 2016-12-02 DIAGNOSIS — I428 Other cardiomyopathies: Secondary | ICD-10-CM

## 2016-12-03 ENCOUNTER — Encounter (HOSPITAL_COMMUNITY)
Admission: RE | Admit: 2016-12-03 | Discharge: 2016-12-03 | Disposition: A | Discharge status: Home / Self Care | Payer: BC Managed Care – PPO | Source: Ambulatory Visit | Attending: Internal Medicine | Admitting: Internal Medicine

## 2016-12-03 DIAGNOSIS — I5022 Chronic systolic (congestive) heart failure: Secondary | ICD-10-CM | POA: Diagnosis not present

## 2016-12-03 LAB — CUP PACEART REMOTE DEVICE CHECK
Battery Remaining Longevity: 20 mo
Battery Voltage: 2.92 V
Brady Statistic AP VP Percent: 0.01 %
Brady Statistic AP VS Percent: 0.04 %
Brady Statistic AS VP Percent: 98.65 %
Brady Statistic AS VS Percent: 1.3 %
Brady Statistic RA Percent Paced: 0.05 %
Brady Statistic RV Percent Paced: 4.46 %
Date Time Interrogation Session: 20180705052408
HighPow Impedance: 76 Ohm
Implantable Lead Implant Date: 20131211
Implantable Lead Implant Date: 20131211
Implantable Lead Implant Date: 20131211
Implantable Lead Location: 753858
Implantable Lead Location: 753859
Implantable Lead Location: 753860
Implantable Lead Model: 181
Implantable Lead Model: 4396
Implantable Lead Model: 5076
Implantable Lead Serial Number: 32342
Implantable Pulse Generator Implant Date: 20131211
Lead Channel Impedance Value: 1121 Ohm
Lead Channel Impedance Value: 399 Ohm
Lead Channel Impedance Value: 551 Ohm
Lead Channel Impedance Value: 646 Ohm
Lead Channel Impedance Value: 646 Ohm
Lead Channel Impedance Value: 665 Ohm
Lead Channel Pacing Threshold Amplitude: 0.5 V
Lead Channel Pacing Threshold Amplitude: 0.625 V
Lead Channel Pacing Threshold Amplitude: 3 V
Lead Channel Pacing Threshold Pulse Width: 0.4 ms
Lead Channel Pacing Threshold Pulse Width: 0.4 ms
Lead Channel Pacing Threshold Pulse Width: 0.8 ms
Lead Channel Sensing Intrinsic Amplitude: 10.125 mV
Lead Channel Sensing Intrinsic Amplitude: 10.125 mV
Lead Channel Sensing Intrinsic Amplitude: 4 mV
Lead Channel Sensing Intrinsic Amplitude: 4 mV
Lead Channel Setting Pacing Amplitude: 2 V
Lead Channel Setting Pacing Amplitude: 2.5 V
Lead Channel Setting Pacing Amplitude: 3.25 V
Lead Channel Setting Pacing Pulse Width: 0.4 ms
Lead Channel Setting Pacing Pulse Width: 0.8 ms
Lead Channel Setting Sensing Sensitivity: 0.45 mV

## 2016-12-03 NOTE — Progress Notes (Signed)
Remote ICD transmission.   

## 2016-12-06 ENCOUNTER — Encounter (HOSPITAL_COMMUNITY)
Admission: RE | Admit: 2016-12-06 | Discharge: 2016-12-06 | Disposition: A | Discharge status: Home / Self Care | Payer: BC Managed Care – PPO | Source: Ambulatory Visit | Attending: Internal Medicine | Admitting: Internal Medicine

## 2016-12-06 DIAGNOSIS — I5022 Chronic systolic (congestive) heart failure: Secondary | ICD-10-CM

## 2016-12-08 ENCOUNTER — Encounter (HOSPITAL_COMMUNITY)
Admission: RE | Admit: 2016-12-08 | Discharge: 2016-12-08 | Disposition: A | Discharge status: Home / Self Care | Payer: BC Managed Care – PPO | Source: Ambulatory Visit | Attending: Internal Medicine | Admitting: Internal Medicine

## 2016-12-08 DIAGNOSIS — I5022 Chronic systolic (congestive) heart failure: Secondary | ICD-10-CM | POA: Diagnosis not present

## 2016-12-10 ENCOUNTER — Encounter: Payer: Self-pay | Admitting: Cardiology

## 2016-12-10 ENCOUNTER — Encounter (HOSPITAL_COMMUNITY)
Admission: RE | Admit: 2016-12-10 | Discharge: 2016-12-10 | Disposition: A | Discharge status: Home / Self Care | Payer: BC Managed Care – PPO | Source: Ambulatory Visit | Attending: Internal Medicine | Admitting: Internal Medicine

## 2016-12-10 DIAGNOSIS — I5022 Chronic systolic (congestive) heart failure: Secondary | ICD-10-CM | POA: Diagnosis not present

## 2016-12-13 ENCOUNTER — Encounter (HOSPITAL_COMMUNITY)
Admission: RE | Admit: 2016-12-13 | Discharge: 2016-12-13 | Disposition: A | Discharge status: Home / Self Care | Payer: BC Managed Care – PPO | Source: Ambulatory Visit | Attending: Internal Medicine | Admitting: Internal Medicine

## 2016-12-13 DIAGNOSIS — I5022 Chronic systolic (congestive) heart failure: Secondary | ICD-10-CM

## 2016-12-14 NOTE — Progress Notes (Signed)
Cardiac Individual Treatment Plan  Patient Details  Name: Cynthia Roth MRN: [id number] Date of Birth: 1950/11/03 Referring Provider:     CARDIAC REHAB PHASE II ORIENTATION from 10/19/2016 in [place]  Referring Provider  Klein,Steven  MD      Initial Encounter Date:    CARDIAC REHAB PHASE II ORIENTATION from 10/19/2016 in [place]  Date  10/19/16  Referring Provider  Klein,Steven  MD      Visit Diagnosis: Chronic systolic congestive heart failure ([place])  Patient's Home Medications on Admission:  Current Outpatient Prescriptions:  .  aspirin 81 MG tablet, Take 81 mg by mouth daily., Disp: , Rfl:  .  atorvastatin (LIPITOR) 20 MG tablet, Take 1 tablet (20 mg total) by mouth daily., Disp: 90 tablet, Rfl: 3 .  B COMPLEX VITAMINS SL, Place 1 drop under the tongue daily., Disp: , Rfl:  .  BIDIL 20-37.5 MG tablet, TAKE (1/2) TABLET THREE TIMES DAILY., Disp: 45 tablet, Rfl: 11 .  carvedilol (COREG) 12.5 MG tablet, Take 1 tablet (12.5 mg total) by mouth 2 (two) times daily., Disp: , Rfl:  .  carvedilol (COREG) 12.5 MG tablet, TAKE 1 TABLET (12.5 MG TOTAL) BY MOUTH 2 (TWO) TIMES DAILY WITH A MEAL., Disp: 120 tablet, Rfl: 3 .  Cholecalciferol (VITAMIN D3) 2000 units TABS, Take 1 tablet by mouth daily., Disp: , Rfl:  .  furosemide (LASIX) 20 MG tablet, Take 1 tablet (20 mg total) by mouth every other day. Make take extra 20 mg tablet once daily as needed for additional swelling/weight gain., Disp: 15 tablet, Rfl: 6 .  lisinopril (PRINIVIL,ZESTRIL) 20 MG tablet, TAKE 1 TABLET BY MOUTH TWICE A DAY, Disp: 60 tablet, Rfl: 10 .  magnesium oxide (MAG-OX) 400 (241.3 Mg) MG tablet, Take 1 tablet (400 mg total) by mouth daily., Disp: 90 tablet, Rfl: 3 .  spironolactone (ALDACTONE) 25 MG tablet, Take 1 tablet (25 mg total) by mouth daily., Disp: 30 tablet, Rfl: 3  Past Medical History: Past Medical History:  Diagnosis Date  . Allergy    . Biventricular ICD -Medtronic    DOI 12/13 - Medtronic Viva XR CRT-D defibrillator, serial #[id number].    . Chronic systolic heart failure ([place]) 12/02/2011  . Dyslipidemia 12/02/2011  . GERD (gastroesophageal reflux disease)   . Gout    "very seldom" (05/10/2012)  . Hx of colonic polyps   . Hypertension    "used to have this; now my BP is low" (05/10/2012)  . LBBB (left bundle branch block) 12/02/2011  . Nonischemic cardiomyopathy ([place])   . Tubular adenoma 1999   history of    Tobacco Use: History  Smoking Status  . Never Smoker  Smokeless Tobacco  . Never Used    Labs: Recent Review Flowsheet Data    Labs for ITP Cardiac and Pulmonary Rehab Latest Ref Rng & Units 12/01/2011 06/16/2015   Cholestrol 125 - 200 mg/dL 168 182   LDLCALC <130 mg/dL 125(H) 131(H)   HDL >=46 mg/dL 28(L) 32(L)   Trlycerides <150 mg/dL 73 94      Capillary Blood Glucose: No results found for: GLUCAP   Exercise Target Goals:    Exercise Program Goal: Individual exercise prescription set with THRR, safety & activity barriers. Participant demonstrates ability to understand and report RPE using BORG scale, to self-measure pulse accurately, and to acknowledge the importance of the exercise prescription.  Exercise Prescription Goal: Starting with aerobic activity 30 plus  minutes a day, 3 days per week for initial exercise prescription. Provide home exercise prescription and guidelines that participant acknowledges understanding prior to discharge.  Activity Barriers & Risk Stratification:     Activity Barriers & Cardiac Risk Stratification - 10/19/16 1038      Activity Barriers & Cardiac Risk Stratification   Activity Barriers Other (comment);Deconditioning;Muscular Weakness   Comments L sided back; R knee and foot in pain when wearing heels at church   Cardiac Risk Stratification High      6 Minute Walk:     6 Minute Walk    Row Name 10/19/16 0932 10/19/16 1038       6 Minute Walk    Phase Initial  -    Distance 1467 feet  -    Walk Time 6 minutes  -    # of Rest Breaks 0  -    MPH  - 2.78    METS  - 3    RPE 15  -    VO2 Peak  - 10.5    Symptoms No  -    Resting HR  - 85 bpm    Resting BP 101/62  -    Max Ex. HR  - 123 bpm    Max Ex. BP 107/70  -    2 Minute Post BP  - 100/60       Oxygen Initial Assessment:   Oxygen Re-Evaluation:   Oxygen Discharge (Final Oxygen Re-Evaluation):   Initial Exercise Prescription:     Initial Exercise Prescription - 10/19/16 1000      Date of Initial Exercise RX and Referring Provider   Date 10/19/16   Referring Provider Klein,Steven  MD     Bike   Level 0.8   Minutes 10   METs 2.59     NuStep   Level 3   SPM 80   Minutes 10   METs 2     Track   Laps 11   Minutes 10   METs 2.92     Prescription Details   Frequency (times per week) 3   Duration Progress to 30 minutes of continuous aerobic without signs/symptoms of physical distress     Intensity   THRR 40-80% of Max Heartrate 62-125   Ratings of Perceived Exertion 11-13   Perceived Dyspnea 0-4     Progression   Progression Continue to progress workloads to maintain intensity without signs/symptoms of physical distress.     Resistance Training   Training Prescription Yes   Weight 2lbs   Reps 10-15      Perform Capillary Blood Glucose checks as needed.  Exercise Prescription Changes:     Exercise Prescription Changes    Row Name 11/08/16 1700 12/08/16 1700           Response to Exercise   Blood Pressure (Admit) 107/68 98/60      Blood Pressure (Exercise) 108/70 114/60      Blood Pressure (Exit) 92/52 108/62      Heart Rate (Admit) 95 bpm 88 bpm      Heart Rate (Exercise) 123 bpm 121 bpm      Heart Rate (Exit) 93 bpm 82 bpm      Rating of Perceived Exertion (Exercise) 14 11      Duration Progress to 45 minutes of aerobic exercise without signs/symptoms of physical distress Progress to 45 minutes of aerobic exercise without  signs/symptoms of physical distress      Intensity THRR unchanged THRR unchanged  Progression   Progression Continue to progress workloads to maintain intensity without signs/symptoms of physical distress. Continue to progress workloads to maintain intensity without signs/symptoms of physical distress.      Average METs 2.6 2.7        Resistance Training   Training Prescription Yes Yes      Weight 2lbs 2lbs      Reps 10-15 10-15        Bike   Level 0.8 0.8      Minutes 10 10      METs 2.6 2.6        NuStep   Level 3 3      SPM 80 80      Minutes 10 10      METs 2.4 2.5        Track   Laps 14 11      Minutes 10 10      METs 3.43 2.9        Home Exercise Plan   Plans to continue exercise at Home (comment) Home (comment)      Frequency Add 3 additional days to program exercise sessions. Add 3 additional days to program exercise sessions.      Initial Home Exercises Provided 11/08/16 11/08/16         Exercise Comments:     Exercise Comments    Row Name 11/15/16 3614 12/08/16 1703         Exercise Comments reviewed METs and goals with pt.   reviewed goals with pt and she continues to do well with exercise.          Exercise Goals and Review:     Exercise Goals    Row Name 10/19/16 0850             Exercise Goals   Increase Physical Activity Yes       Intervention Provide advice, education, support and counseling about physical activity/exercise needs.;Develop an individualized exercise prescription for aerobic and resistive training based on initial evaluation findings, risk stratification, comorbidities and participant's personal goals.       Expected Outcomes Achievement of increased cardiorespiratory fitness and enhanced flexibility, muscular endurance and strength shown through measurements of functional capacity and personal statement of participant.       Increase Strength and Stamina Yes       Intervention Provide advice, education, support and  counseling about physical activity/exercise needs.;Develop an individualized exercise prescription for aerobic and resistive training based on initial evaluation findings, risk stratification, comorbidities and participant's personal goals.       Expected Outcomes Achievement of increased cardiorespiratory fitness and enhanced flexibility, muscular endurance and strength shown through measurements of functional capacity and personal statement of participant.          Exercise Goals Re-Evaluation :     Exercise Goals Re-Evaluation    Row Name 11/15/16 0905 12/08/16 1702           Exercise Goal Re-Evaluation   Exercise Goals Review Increase Physical Activity;Increase Strenth and Stamina Increase Physical Activity;Increase Strenth and Stamina      Comments pt states that she is still acclimating to an exercise routine but she is doing well with exercise thus far.  pt is doing well with exercise and states that she feels much better than she did in the beginning.  She feels stronger has more energy and she is more confident with exercise,       Expected Outcomes Continue with exercise Rx and  increase workloads as tolerated and as pt gets stronger she will begin to feel more comfortable with exercise.  Continue with exercise Rx and increase workloads as tolerated and as pt gets stronger she will begin to feel more comfortable with exercise.           Discharge Exercise Prescription (Final Exercise Prescription Changes):     Exercise Prescription Changes - 12/08/16 1700      Response to Exercise   Blood Pressure (Admit) 98/60   Blood Pressure (Exercise) 114/60   Blood Pressure (Exit) 108/62   Heart Rate (Admit) 88 bpm   Heart Rate (Exercise) 121 bpm   Heart Rate (Exit) 82 bpm   Rating of Perceived Exertion (Exercise) 11   Duration Progress to 45 minutes of aerobic exercise without signs/symptoms of physical distress   Intensity THRR unchanged     Progression   Progression Continue to  progress workloads to maintain intensity without signs/symptoms of physical distress.   Average METs 2.7     Resistance Training   Training Prescription Yes   Weight 2lbs   Reps 10-15     Bike   Level 0.8   Minutes 10   METs 2.6     NuStep   Level 3   SPM 80   Minutes 10   METs 2.5     Track   Laps 11   Minutes 10   METs 2.9     Home Exercise Plan   Plans to continue exercise at Home (comment)   Frequency Add 3 additional days to program exercise sessions.   Initial Home Exercises Provided 11/08/16      Nutrition:  Target Goals: Understanding of nutrition guidelines, daily intake of sodium 1500mg , cholesterol 200mg , calories 30% from fat and 7% or less from saturated fats, daily to have 5 or more servings of fruits and vegetables.  Biometrics:     Pre Biometrics - 10/19/16 0933      Pre Biometrics   Waist Circumference 40.75 inches   Hip Circumference 50 inches   Waist to Hip Ratio 0.82 %   Triceps Skinfold 35 mm   Grip Strength 30 kg   Flexibility 14.5 in   Single Leg Stand 3 seconds       Nutrition Therapy Plan and Nutrition Goals:     Nutrition Therapy & Goals - 11/26/16 0815      Nutrition Therapy   Diet Therapeutic Lifestyle Changes     Personal Nutrition Goals   Nutrition Goal Wt loss goal of 1-2 lb/week to a wt loss goal of 6-24 lb at graduation.   Personal Goal #2 Pt to identify and limit food sources of saturated fat, trans fat, and sodium     Intervention Plan   Intervention Prescribe, educate and counsel regarding individualized specific dietary modifications aiming towards targeted core components such as weight, hypertension, lipid management, diabetes, heart failure and other comorbidities.   Expected Outcomes Short Term Goal: Understand basic principles of dietary content, such as calories, fat, sodium, cholesterol and nutrients.;Long Term Goal: Adherence to prescribed nutrition plan.      Nutrition Discharge: Nutrition Scores:      Nutrition Assessments - 11/26/16 0816      MEDFICTS Scores   Pre Score 45      Nutrition Goals Re-Evaluation:   Nutrition Goals Re-Evaluation:   Nutrition Goals Discharge (Final Nutrition Goals Re-Evaluation):   Psychosocial: Target Goals: Acknowledge presence or absence of significant depression and/or stress, maximize coping skills, provide positive support  system. Participant is able to verbalize types and ability to use techniques and skills needed for reducing stress and depression.  Initial Review & Psychosocial Screening:     Initial Psych Review & Screening - 10/27/16 0752      Family Dynamics   Concerns Recent loss of significant other   Comments pt mother and grandmother passed away in past 2 years.  pt is still experiencing grief.  pt offered counseling with Jeanella Craze.  pt has also previously considered Hospice counseling.  encouraged her to take advantage of these opportunities.       Barriers   Psychosocial barriers to participate in program The patient should benefit from training in stress management and relaxation.     Screening Interventions   Interventions Encouraged to exercise;To provide support and resources with identified psychosocial needs;Provide feedback about the scores to participant      Quality of Life Scores:     Quality of Life - 11/10/16 0828      Quality of Life Scores   Health/Function Pre 21.43 %  pt with health related anxiety about recent cardiac event.  pt concerned about fatigue and lack of energy. pt counseled on continuing current regimen including exercise.  will continue to monitor BP and symptoms. will report to Dr. [name] accordingly.   Socioeconomic Pre 20.86 %  pt with stressful job looking forward to retirement soon.     Psych/Spiritual Pre 22.29 %   Family Pre 22.25 %   GLOBAL Pre 21.59 %  pt offered emotional support and reassurance.  pt encouraged to continue exercise and stress management education.         PHQ-9: Recent Review Flowsheet Data    Depression screen [facility] 2/9 10/27/2016   Decreased Interest 0   Down, Depressed, Hopeless 1    PHQ - 2 Score 1     Interpretation of Total Score  Total Score Depression Severity:  1-4 = Minimal depression, 5-9 = Mild depression, 10-14 = Moderate depression, 15-19 = Moderately severe depression, 20-27 = Severe depression   Psychosocial Evaluation and Intervention:     Psychosocial Evaluation - 10/27/16 1716      Psychosocial Evaluation & Interventions   Interventions Stress management education;Relaxation education;Encouraged to exercise with the program and follow exercise prescription   Comments pt mother and grandmother passed away within past 2 years.  pt displays normal grief pattern. pt offered counseling with Jeanella Craze.     Continue Psychosocial Services  Follow up required by staff      Psychosocial Re-Evaluation:     Psychosocial Re-Evaluation    Row Name 11/16/16 0724 12/14/16 1532           Psychosocial Re-Evaluation   Current issues with Current Stress Concerns Current Stress Concerns      Comments pt with normal grief pattern from recent loss of mother and grandmother. with continued CR participation pt mood and outlook are improving. pt is looking forward to retirement.   pt mood and outlook continue to improve.  pt is looking forward to retirement.       Expected Outcomes pt will exhibit positive outlook with good coping skills and normal grief pattern.  pt will exhibit positive outlook with good coping skills and normal grief pattern.       Interventions Stress management education;Relaxation education;Encouraged to attend Cardiac Rehabilitation for the exercise Stress management education;Relaxation education;Encouraged to attend Cardiac Rehabilitation for the exercise      Continue Psychosocial Services  Follow up required  by staff Follow up required by staff         Psychosocial Discharge (Final Psychosocial  Re-Evaluation):     Psychosocial Re-Evaluation - 12/14/16 1532      Psychosocial Re-Evaluation   Current issues with Current Stress Concerns   Comments  pt mood and outlook continue to improve.  pt is looking forward to retirement.    Expected Outcomes pt will exhibit positive outlook with good coping skills and normal grief pattern.    Interventions Stress management education;Relaxation education;Encouraged to attend Cardiac Rehabilitation for the exercise   Continue Psychosocial Services  Follow up required by staff      Vocational Rehabilitation: Provide vocational rehab assistance to qualifying candidates.   Vocational Rehab Evaluation & Intervention:     Vocational Rehab - 10/19/16 1114      Initial Vocational Rehab Evaluation & Intervention   Assessment shows need for Vocational Rehabilitation No      Education: Education Goals: Education classes will be provided on a weekly basis, covering required topics. Participant will state understanding/return demonstration of topics presented.  Learning Barriers/Preferences:     Learning Barriers/Preferences - 10/19/16 0849      Learning Barriers/Preferences   Learning Barriers Sight   Learning Preferences Written Material;Video;Verbal Instruction;Skilled Demonstration;Pictoral      Education Topics: Count Your Pulse:  -Group instruction provided by verbal instruction, demonstration, patient participation and written materials to support subject.  Instructors address importance of being able to find your pulse and how to count your pulse when at home without a heart monitor.  Patients get hands on experience counting their pulse with staff help and individually.   Heart Attack, Angina, and Risk Factor Modification:  -Group instruction provided by verbal instruction, video, and written materials to support subject.  Instructors address signs and symptoms of angina and heart attacks.    Also discuss risk factors for heart  disease and how to make changes to improve heart health risk factors.   Functional Fitness:  -Group instruction provided by verbal instruction, demonstration, patient participation, and written materials to support subject.  Instructors address safety measures for doing things around the house.  Discuss how to get up and down off the floor, how to pick things up properly, how to safely get out of a chair without assistance, and balance training.   Meditation and Mindfulness:  -Group instruction provided by verbal instruction, patient participation, and written materials to support subject.  Instructor addresses importance of mindfulness and meditation practice to help reduce stress and improve awareness.  Instructor also leads participants through a meditation exercise.    Stretching for Flexibility and Mobility:  -Group instruction provided by verbal instruction, patient participation, and written materials to support subject.  Instructors lead participants through series of stretches that are designed to increase flexibility thus improving mobility.  These stretches are additional exercise for major muscle groups that are typically performed during regular warm up and cool down.   Hands Only CPR:  -Group verbal, video, and participation provides a basic overview of AHA guidelines for community CPR. Role-play of emergencies allow participants the opportunity to practice calling for help and chest compression technique with discussion of AED use.   Hypertension: -Group verbal and written instruction that provides a basic overview of hypertension including the most recent diagnostic guidelines, risk factor reduction with self-care instructions and medication management.    Nutrition I class: Heart Healthy Eating:  -Group instruction provided by PowerPoint slides, verbal discussion, and written materials to support subject  matter. The instructor gives an explanation and review of the  Therapeutic Lifestyle Changes diet recommendations, which includes a discussion on lipid goals, dietary fat, sodium, fiber, plant stanol/sterol esters, sugar, and the components of a well-balanced, healthy diet.   Nutrition II class: Lifestyle Skills:  -Group instruction provided by PowerPoint slides, verbal discussion, and written materials to support subject matter. The instructor gives an explanation and review of label reading, grocery shopping for heart health, heart healthy recipe modifications, and ways to make healthier choices when eating out.   Diabetes Question & Answer:  -Group instruction provided by PowerPoint slides, verbal discussion, and written materials to support subject matter. The instructor gives an explanation and review of diabetes co-morbidities, pre- and post-prandial blood glucose goals, pre-exercise blood glucose goals, signs, symptoms, and treatment of hypoglycemia and hyperglycemia, and foot care basics.   Diabetes Blitz:  -Group instruction provided by PowerPoint slides, verbal discussion, and written materials to support subject matter. The instructor gives an explanation and review of the physiology behind type 1 and type 2 diabetes, diabetes medications and rational behind using different medications, pre- and post-prandial blood glucose recommendations and Hemoglobin A1c goals, diabetes diet, and exercise including blood glucose guidelines for exercising safely.    Portion Distortion:  -Group instruction provided by PowerPoint slides, verbal discussion, written materials, and food models to support subject matter. The instructor gives an explanation of serving size versus portion size, changes in portions sizes over the last 20 years, and what consists of a serving from each food group.   Stress Management:  -Group instruction provided by verbal instruction, video, and written materials to support subject matter.  Instructors review role of stress in heart  disease and how to cope with stress positively.     Exercising on Your Own:  -Group instruction provided by verbal instruction, power point, and written materials to support subject.  Instructors discuss benefits of exercise, components of exercise, frequency and intensity of exercise, and end points for exercise.  Also discuss use of nitroglycerin and activating EMS.  Review options of places to exercise outside of rehab.  Review guidelines for sex with heart disease.   Cardiac Drugs I:  -Group instruction provided by verbal instruction and written materials to support subject.  Instructor reviews cardiac drug classes: antiplatelets, anticoagulants, beta blockers, and statins.  Instructor discusses reasons, side effects, and lifestyle considerations for each drug class.   Cardiac Drugs II:  -Group instruction provided by verbal instruction and written materials to support subject.  Instructor reviews cardiac drug classes: angiotensin converting enzyme inhibitors (ACE-I), angiotensin II receptor blockers (ARBs), nitrates, and calcium channel blockers.  Instructor discusses reasons, side effects, and lifestyle considerations for each drug class.   Anatomy and Physiology of the Circulatory System:  Group verbal and written instruction and models provide basic cardiac anatomy and physiology, with the coronary electrical and arterial systems. Review of: AMI, Angina, Valve disease, Heart Failure, Peripheral Artery Disease, Cardiac Arrhythmia, Pacemakers, and the ICD.   CARDIAC REHAB PHASE II EXERCISE from 10/27/2016 in [place]  Date  10/27/16  Instruction Review Code  2- meets goals/outcomes      Other Education:  -Group or individual verbal, written, or video instructions that support the educational goals of the cardiac rehab program.   Knowledge Questionnaire Score:     Knowledge Questionnaire Score - 10/19/16 0950      Knowledge Questionnaire Score    Pre Score 22/24      Core Components/Risk  Factors/Patient Goals at Admission:     Personal Goals and Risk Factors at Admission - 10/19/16 0934      Core Components/Risk Factors/Patient Goals on Admission    Weight Management Yes;Obesity   Intervention Weight Management: Develop a combined nutrition and exercise program designed to reach desired caloric intake, while maintaining appropriate intake of nutrient and fiber, sodium and fats, and appropriate energy expenditure required for the weight goal.;Weight Management: Provide education and appropriate resources to help participant work on and attain dietary goals.;Weight Management/Obesity: Establish reasonable short term and long term weight goals.;Obesity: Provide education and appropriate resources to help participant work on and attain dietary goals.   Admit Weight 208 lb 1.8 oz (94.4 kg)   Goal Weight: Short Term 198 lb (89.8 kg)   Goal Weight: Long Term 150 lb (68 kg)   Expected Outcomes Short Term: Continue to assess and modify interventions until short term weight is achieved;Long Term: Adherence to nutrition and physical activity/exercise program aimed toward attainment of established weight goal;Weight Loss: Understanding of general recommendations for a balanced deficit meal plan, which promotes 1-2 lb weight loss per week and includes a negative energy balance of [phone] kcal/d;Understanding of distribution of calorie intake throughout the day with the consumption of 4-5 meals/snacks;Understanding recommendations for meals to include 15-35% energy as protein, 25-35% energy from fat, 35-60% energy from carbohydrates, less than 200mg  of dietary cholesterol, 20-35 gm of total fiber daily   Heart Failure Yes   Intervention Provide a combined exercise and nutrition program that is supplemented with education, support and counseling about heart failure. Directed toward relieving symptoms such as shortness of breath, decreased exercise  tolerance, and extremity edema.   Expected Outcomes Improve functional capacity of life;Long term: Adoption of self-care skills and reduction of barriers for early signs and symptoms recognition and intervention leading to self-care maintenance.;Short term: Daily weights obtained and reported for increase. Utilizing diuretic protocols set by physician.;Short term: Attendance in program 2-3 days a week with increased exercise capacity. Reported lower sodium intake. Reported increased fruit and vegetable intake. Reports medication compliance.   Hypertension Yes   Intervention Provide education on lifestyle modifcations including regular physical activity/exercise, weight management, moderate sodium restriction and increased consumption of fresh fruit, vegetables, and low fat dairy, alcohol moderation, and smoking cessation.;Monitor prescription use compliance.   Expected Outcomes Short Term: Continued assessment and intervention until BP is < 140/22mm HG in hypertensive participants. < 130/110mm HG in hypertensive participants with diabetes, heart failure or chronic kidney disease.;Long Term: Maintenance of blood pressure at goal levels.   Lipids Yes   Intervention Provide education and support for participant on nutrition & aerobic/resistive exercise along with prescribed medications to achieve LDL 70mg , HDL >40mg .   Expected Outcomes Short Term: Participant states understanding of desired cholesterol values and is compliant with medications prescribed. Participant is following exercise prescription and nutrition guidelines.;Long Term: Cholesterol controlled with medications as prescribed, with individualized exercise RX and with personalized nutrition plan. Value goals: LDL < 70mg , HDL > 40 mg.   Stress Yes   Intervention Offer individual and/or small group education and counseling on adjustment to heart disease, stress management and health-related lifestyle change. Teach and support self-help  strategies.;Refer participants experiencing significant psychosocial distress to appropriate mental health specialists for further evaluation and treatment. When possible, include family members and significant others in education/counseling sessions.   Expected Outcomes Short Term: Participant demonstrates changes in health-related behavior, relaxation and other stress management skills, ability to obtain effective social support,  and compliance with psychotropic medications if prescribed.;Long Term: Emotional wellbeing is indicated by absence of clinically significant psychosocial distress or social isolation.   Personal Goal Other Yes   Personal Goal To be at the appropriate weight for height (healthy BMI range). Learn HH and diabetic nutrition   Intervention Provide nutrition counseling and exercise programming to assist with weightloss goals and improve dietary habits   Expected Outcomes Pt will have a better understanding of diabetes, learn HH diet, develop an exercise routine to assist with losing weight.      Core Components/Risk Factors/Patient Goals Review:      Goals and Risk Factor Review    Row Name 11/09/16 1629 12/14/16 1532           Core Components/Risk Factors/Patient Goals Review   Personal Goals Review Weight Management/Obesity;Heart Failure;Hypertension;Lipids;Stress Weight Management/Obesity;Heart Failure;Hypertension;Lipids;Stress      Review pt has not developed HEP.  However, pt does note increased energy and improved symptoms with M, W, F CR exercise.  pt has not developed HEP.  However, pt does note increased energy and improved symptoms with M, W, F CR exercise.       Expected Outcomes pt will continue CR exercise, nutrition, and lifestyle modification education to promote weight loss and reduce overall CAD risk factors.   pt will continue CR exercise, nutrition, and lifestyle modification education to promote weight loss and reduce overall CAD risk factors.            Core Components/Risk Factors/Patient Goals at Discharge (Final Review):      Goals and Risk Factor Review - 12/14/16 1532      Core Components/Risk Factors/Patient Goals Review   Personal Goals Review Weight Management/Obesity;Heart Failure;Hypertension;Lipids;Stress   Review pt has not developed HEP.  However, pt does note increased energy and improved symptoms with M, W, F CR exercise.    Expected Outcomes pt will continue CR exercise, nutrition, and lifestyle modification education to promote weight loss and reduce overall CAD risk factors.        ITP Comments:     ITP Comments    Row Name 10/19/16 [phone] 11/16/16 0724 12/14/16 1532       ITP Comments Dr. Fransico Him, Medical Director  Dr. Fransico Him, Medical Director  Dr. Fransico Him, Medical Director         Comments: Pt is making expected progress toward personal goals after completing 16 sessions. Recommend continued exercise and life style modification education including  stress management and relaxation techniques to decrease cardiac risk profile.

## 2016-12-15 ENCOUNTER — Encounter (HOSPITAL_COMMUNITY)
Admission: RE | Admit: 2016-12-15 | Discharge: 2016-12-15 | Disposition: A | Discharge status: Home / Self Care | Payer: BC Managed Care – PPO | Source: Ambulatory Visit | Attending: Internal Medicine | Admitting: Internal Medicine

## 2016-12-15 DIAGNOSIS — I5022 Chronic systolic (congestive) heart failure: Secondary | ICD-10-CM | POA: Diagnosis not present

## 2016-12-17 ENCOUNTER — Encounter (HOSPITAL_COMMUNITY): Payer: BC Managed Care – PPO

## 2016-12-20 ENCOUNTER — Encounter (HOSPITAL_COMMUNITY)
Admission: RE | Admit: 2016-12-20 | Discharge: 2016-12-20 | Disposition: A | Discharge status: Home / Self Care | Payer: BC Managed Care – PPO | Source: Ambulatory Visit | Attending: Internal Medicine | Admitting: Internal Medicine

## 2016-12-20 DIAGNOSIS — I5022 Chronic systolic (congestive) heart failure: Secondary | ICD-10-CM | POA: Diagnosis not present

## 2016-12-22 ENCOUNTER — Encounter (HOSPITAL_COMMUNITY)
Admission: RE | Admit: 2016-12-22 | Discharge: 2016-12-22 | Disposition: A | Discharge status: Home / Self Care | Payer: BC Managed Care – PPO | Source: Ambulatory Visit | Attending: Internal Medicine | Admitting: Internal Medicine

## 2016-12-22 DIAGNOSIS — I5022 Chronic systolic (congestive) heart failure: Secondary | ICD-10-CM

## 2016-12-24 ENCOUNTER — Encounter (HOSPITAL_COMMUNITY)
Admission: RE | Admit: 2016-12-24 | Discharge: 2016-12-24 | Disposition: A | Discharge status: Home / Self Care | Payer: BC Managed Care – PPO | Source: Ambulatory Visit | Attending: Internal Medicine | Admitting: Internal Medicine

## 2016-12-24 DIAGNOSIS — I5022 Chronic systolic (congestive) heart failure: Secondary | ICD-10-CM | POA: Diagnosis not present

## 2016-12-27 ENCOUNTER — Encounter (HOSPITAL_COMMUNITY)
Admission: RE | Admit: 2016-12-27 | Discharge: 2016-12-27 | Disposition: A | Discharge status: Home / Self Care | Payer: BC Managed Care – PPO | Source: Ambulatory Visit | Attending: Internal Medicine | Admitting: Internal Medicine

## 2016-12-27 DIAGNOSIS — I5022 Chronic systolic (congestive) heart failure: Secondary | ICD-10-CM | POA: Diagnosis not present

## 2016-12-29 ENCOUNTER — Encounter (HOSPITAL_COMMUNITY): Payer: BC Managed Care – PPO

## 2016-12-31 ENCOUNTER — Encounter (HOSPITAL_COMMUNITY)
Admission: RE | Admit: 2016-12-31 | Discharge: 2016-12-31 | Disposition: A | Discharge status: Home / Self Care | Payer: BC Managed Care – PPO | Source: Ambulatory Visit | Attending: Internal Medicine | Admitting: Internal Medicine

## 2016-12-31 DIAGNOSIS — I5022 Chronic systolic (congestive) heart failure: Secondary | ICD-10-CM | POA: Diagnosis not present

## 2017-01-03 ENCOUNTER — Encounter (HOSPITAL_COMMUNITY)
Admission: RE | Admit: 2017-01-03 | Discharge: 2017-01-03 | Disposition: A | Discharge status: Home / Self Care | Payer: BC Managed Care – PPO | Source: Ambulatory Visit | Attending: Internal Medicine | Admitting: Internal Medicine

## 2017-01-03 DIAGNOSIS — I5022 Chronic systolic (congestive) heart failure: Secondary | ICD-10-CM | POA: Diagnosis not present

## 2017-01-05 ENCOUNTER — Encounter (HOSPITAL_COMMUNITY)
Admission: RE | Admit: 2017-01-05 | Discharge: 2017-01-05 | Disposition: A | Discharge status: Home / Self Care | Payer: BC Managed Care – PPO | Source: Ambulatory Visit | Attending: Internal Medicine | Admitting: Internal Medicine

## 2017-01-05 DIAGNOSIS — I5022 Chronic systolic (congestive) heart failure: Secondary | ICD-10-CM | POA: Diagnosis not present

## 2017-01-07 ENCOUNTER — Encounter (HOSPITAL_COMMUNITY)
Admission: RE | Admit: 2017-01-07 | Discharge: 2017-01-07 | Disposition: A | Discharge status: Home / Self Care | Payer: BC Managed Care – PPO | Source: Ambulatory Visit | Attending: Internal Medicine | Admitting: Internal Medicine

## 2017-01-07 DIAGNOSIS — I5022 Chronic systolic (congestive) heart failure: Secondary | ICD-10-CM | POA: Diagnosis not present

## 2017-01-10 ENCOUNTER — Encounter (HOSPITAL_COMMUNITY)
Admission: RE | Admit: 2017-01-10 | Discharge: 2017-01-10 | Disposition: A | Discharge status: Home / Self Care | Payer: BC Managed Care – PPO | Source: Ambulatory Visit | Attending: Internal Medicine | Admitting: Internal Medicine

## 2017-01-10 DIAGNOSIS — I5022 Chronic systolic (congestive) heart failure: Secondary | ICD-10-CM | POA: Diagnosis not present

## 2017-01-11 ENCOUNTER — Ambulatory Visit (HOSPITAL_COMMUNITY)
Admission: RE | Admit: 2017-01-11 | Discharge: 2017-01-11 | Disposition: A | Discharge status: Home / Self Care | Payer: BC Managed Care – PPO | Source: Ambulatory Visit | Attending: Cardiology | Admitting: Cardiology

## 2017-01-11 ENCOUNTER — Encounter (HOSPITAL_COMMUNITY): Payer: Self-pay | Admitting: Cardiology

## 2017-01-11 VITALS — BP 108/66 | HR 79 | Wt 209.0 lb

## 2017-01-11 DIAGNOSIS — Z79899 Other long term (current) drug therapy: Secondary | ICD-10-CM | POA: Diagnosis not present

## 2017-01-11 DIAGNOSIS — I429 Cardiomyopathy, unspecified: Secondary | ICD-10-CM | POA: Diagnosis not present

## 2017-01-11 DIAGNOSIS — I5042 Chronic combined systolic (congestive) and diastolic (congestive) heart failure: Secondary | ICD-10-CM | POA: Diagnosis not present

## 2017-01-11 DIAGNOSIS — I5022 Chronic systolic (congestive) heart failure: Secondary | ICD-10-CM

## 2017-01-11 DIAGNOSIS — E785 Hyperlipidemia, unspecified: Secondary | ICD-10-CM

## 2017-01-11 DIAGNOSIS — Z7982 Long term (current) use of aspirin: Secondary | ICD-10-CM | POA: Diagnosis not present

## 2017-01-11 DIAGNOSIS — I447 Left bundle-branch block, unspecified: Secondary | ICD-10-CM | POA: Diagnosis not present

## 2017-01-11 DIAGNOSIS — I110 Hypertensive heart disease with heart failure: Secondary | ICD-10-CM | POA: Insufficient documentation

## 2017-01-11 LAB — LIPID PANEL
Cholesterol: 127 mg/dL (ref 0–200)
HDL: 42 mg/dL (ref >40)
LDL Cholesterol: 74 mg/dL (ref 0–99)
Total CHOL/HDL Ratio: 3 RATIO
Triglycerides: 55 mg/dL (ref <150)
VLDL: 11 mg/dL (ref 0–40)

## 2017-01-11 LAB — CBC
HCT: 29.5 % — ABNORMAL LOW (ref 36.0–46.0)
Hemoglobin: 9.9 g/dL — ABNORMAL LOW (ref 12.0–15.0)
MCH: 30.9 pg (ref 26.0–34.0)
MCHC: 33.6 g/dL (ref 30.0–36.0)
MCV: 92.2 fL (ref 78.0–100.0)
Platelets: 277 10*3/uL (ref 150–400)
RBC: 3.2 MIL/uL — ABNORMAL LOW (ref 3.87–5.11)
RDW: 13.2 % (ref 11.5–15.5)
WBC: 4.8 10*3/uL (ref 4.0–10.5)

## 2017-01-11 LAB — BASIC METABOLIC PANEL
Anion gap: 9 (ref 5–15)
BUN: 28 mg/dL — ABNORMAL HIGH (ref 6–20)
CO2: 23 mmol/L (ref 22–32)
Calcium: 9.5 mg/dL (ref 8.9–10.3)
Chloride: 106 mmol/L (ref 101–111)
Creatinine, Ser: 1.31 mg/dL — ABNORMAL HIGH (ref 0.44–1.00)
GFR calc Af Amer: 48 mL/min — ABNORMAL LOW (ref >60)
GFR calc non Af Amer: 42 mL/min — ABNORMAL LOW (ref >60)
Glucose, Bld: 87 mg/dL (ref 65–99)
Potassium: 4.7 mmol/L (ref 3.5–5.1)
Sodium: 138 mmol/L (ref 135–145)

## 2017-01-11 NOTE — Progress Notes (Signed)
Patient ID: KHRISTIE SAK, female   DOB: 06/23/1950, 66 y.o.   MRN: [id number] PCP: [facility]: Aluna Whiston is a 66 y.o. female with PMH of nonischemic cardiomyopathy.  Patient was admitted to [facility] in 7/13 with acute CHF.  No prior cardiac history.  No ETOH/Drug history.  No significant family history. No prior viral illness. TSH normal.    She had LBBB on EKG and her echo showed EF 15% with severely dilated LV and diastolic dysfunction. LHC showed no significant coronary disease.  Repeat echo in 10/13 on good medical therapy showed persistent EF 15%.  She had Medtronic CRT-D device placed in 12/13 by Dr. Caryl Comes.  Followup echo in 3/14 showed persistently depressed LV systolic function with EF 20%.  CPX in 07/2013 showed mildly decreased functional capacity, suspect obesity played significant role in this.  Repeat echo in 1/17 showed EF up to 55-60%.    She presents for followup today.  Overall, she is doing well.  Continues to work full time but plans to retire this year. No exertional dyspnea though she does not get much exercise. She does report generalized fatigue.  No chest pain.  She is currently attending cardiac rehab.  No palpitations or lightheadedness, taking all her meds but only remembers to take Bidil twice a day.   Allergies  Allergen Reactions  . Decongestant [Pseudoephedrine Hcl Er]     PATIENT CAN'T REMEMBER    Past Medical History: 1. CHF and Nonischemic cardiomyopathy: patient was admitted with acute CHF on 11/29/11 and Echo showed EF 15% with diffuse hypokinesis, severely dilated LV, severe diastolic dysfunction. Left heart cath showed no angiographic CAD.  TSH, SPEP, urine immunofixation were all within normal range.  Repeat echo 10/13 with EF 15%, diffuse hypokinesis.  Medtronic CRT-D device placed 12/13 Caryl Comes).  Echo (3/14) with EF 20%, diffuse hypokinesis, mild to moderate MR, normal RV. CPX (3/15) with peak VO2 13.4 adjusted to 21 for ideal body weight,  VE/VCO2 slope 31, RER 1.09, mildly decreased functional capacity, suspect obesity plays a role.  - Echo (1/17): EF improved to 55-60% sp CRT placement.  2. LBBB: noted on hospital admission on 11/29/11. No old EKG. 3. HTN 4. Dyslipidemia 5. Tubular adenoma:  Last colonoscopy in 1999. Followed by GI Dr. Collene Mares.  Family History  Problem Relation Age of Onset  . Angina Mother        diagnosed at age of 110's, unsure whether it was a true diagnosis. unsure about the treatment  . Memory loss Mother   . Sleep apnea Brother   . Kidney disease Father   . Diabetes Brother   . Heart failure Maternal Grandmother        diagnosed in age of 74's. still living at age of 34's.  [name] Heart attack Maternal Grandmother   . Stroke Maternal Grandmother   . Colon cancer Unknown        family history  . Hypertension Neg Hx     Social History   Social History  . Marital status: Divorced    Spouse name: n/a  . Number of children: 1  . Years of education: Master's   Occupational History  . Program Manager [employer]   Social History Main Topics  . Smoking status: Never Smoker  . Smokeless tobacco: Never Used  . Alcohol use No  . Drug use: No  . Sexual activity: No   Other Topics Concern  . Not on file  Social History Narrative   Lives in [place], and her mother, grandmother and one brother live with her. Divorced with one adult son who lives in [place], [place]. One brother lives in [place], [place]. Another lives in [place]. 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    Current Outpatient Prescriptions on File Prior to Encounter  Medication Sig Dispense Refill  . aspirin 81 MG tablet Take 81 mg by mouth daily.    [name] atorvastatin (LIPITOR) 20 MG tablet Take 1 tablet (20 mg total) by mouth daily. 90 tablet 3  . B COMPLEX VITAMINS SL Place 1 drop under the tongue daily.    [name] BIDIL 20-37.5 MG tablet TAKE (1/2) TABLET THREE TIMES DAILY. 45 tablet 11  .  carvedilol (COREG) 12.5 MG tablet TAKE 1 TABLET (12.5 MG TOTAL) BY MOUTH 2 (TWO) TIMES DAILY WITH A MEAL. 120 tablet 3  . Cholecalciferol (VITAMIN D3) 2000 units TABS Take 1 tablet by mouth daily.    . furosemide (LASIX) 20 MG tablet Take 1 tablet (20 mg total) by mouth every other day. Make take extra 20 mg tablet once daily as needed for additional swelling/weight gain. 15 tablet 6  . lisinopril (PRINIVIL,ZESTRIL) 20 MG tablet TAKE 1 TABLET BY MOUTH TWICE A DAY 60 tablet 10  . magnesium oxide (MAG-OX) 400 (241.3 Mg) MG tablet Take 1 tablet (400 mg total) by mouth daily. 90 tablet 3  . spironolactone (ALDACTONE) 25 MG tablet Take 1 tablet (25 mg total) by mouth daily. 30 tablet 3   No current facility-administered medications on file prior to encounter.    Vitals:   01/11/17 0855  BP: 108/66  Pulse: 79  SpO2: 98%  Weight: 209 lb (94.8 kg)   Wt Readings from Last 3 Encounters:  01/11/17 209 lb (94.8 kg)  10/19/16 208 lb 1.8 oz (94.4 kg)  09/30/16 208 lb 6 oz (94.5 kg)    General: NAD Neck: No JVD, no thyromegaly or thyroid nodule.  Lungs: Clear to auscultation bilaterally with normal respiratory effort. CV: Nondisplaced PMI.  Heart regular S1/S2, no S3/S4, no murmur.  No peripheral edema.  No carotid bruit.  Normal pedal pulses.  Abdomen: Soft, nontender, no hepatosplenomegaly, no distention.  Skin: Intact without lesions or rashes.  Neurologic: Alert and oriented x 3.  Psych: Normal affect. Extremities: No clubbing or cyanosis.  HEENT: Normal.   Assessment/Plan:  1. Chronic systolic heart failure: Nonischemic cardiomyopathy, s/p Medtronic CRT-D.  Most recent echo in 1/17 showed EF improved to 55-60%.  She is doing well, NYHA class I-II.  She continues to take all her meds. She is not volume overloaded on exam.  - Continue Lasix 20 mg every other day.  - Continue lisinopril 20 mg daily and spironolactone 25 mg daily.  BMET today. - Continue Bidil but ok to take bid given  improvement in EF.  - Continue coreg 12.5 mg BID.  - I will arrange for repeat echo to make sure that EF remains normal.  2. LBBB: s/p CRT-D placement with improvement of EF 3. HTN: Meds as above.    Followup in 6 months.   Loralie Champagne, MD  01/11/2017

## 2017-01-11 NOTE — Patient Instructions (Signed)
Routine lab work today. Will notify you of abnormal results, otherwise no news is good news!  Will schedule you for an echocardiogram at [facility]. Address: [address] ([address]), [place], [place] [address]  Phone: [phone]  No changes to medication at this time.  Follow up 6 months with Dr. [name].  Take all medication as prescribed the day of your appointment. Bring all medications with you to your appointment.  Do the following things EVERYDAY: 1) Weigh yourself in the morning before breakfast. Write it down and keep it in a log. 2) Take your medicines as prescribed 3) Eat low salt foods-Limit salt (sodium) to 2000 mg per day.  4) Stay as active as you can everyday 5) Limit all fluids for the day to less than 2 liters

## 2017-01-12 ENCOUNTER — Encounter (HOSPITAL_COMMUNITY)
Admission: RE | Admit: 2017-01-12 | Discharge: 2017-01-12 | Disposition: A | Discharge status: Home / Self Care | Payer: BC Managed Care – PPO | Source: Ambulatory Visit | Attending: Internal Medicine | Admitting: Internal Medicine

## 2017-01-12 DIAGNOSIS — I5022 Chronic systolic (congestive) heart failure: Secondary | ICD-10-CM

## 2017-01-12 NOTE — Progress Notes (Signed)
Cardiac Individual Treatment Plan  Patient Details  Name: Cynthia Roth MRN: [id number] Date of Birth: 12-20-1950 Referring Provider:     CARDIAC REHAB PHASE II ORIENTATION from 10/19/2016 in [place]  Referring Provider  Klein,Steven  MD      Initial Encounter Date:    CARDIAC REHAB PHASE II ORIENTATION from 10/19/2016 in [facility]  Date  10/19/16  Referring Provider  Klein,Steven  MD      Visit Diagnosis: Chronic systolic congestive heart failure ([place])  Patient's Home Medications on Admission:  Current Outpatient Prescriptions:  .  aspirin 81 MG tablet, Take 81 mg by mouth daily., Disp: , Rfl:  .  atorvastatin (LIPITOR) 20 MG tablet, Take 1 tablet (20 mg total) by mouth daily., Disp: 90 tablet, Rfl: 3 .  B COMPLEX VITAMINS SL, Place 1 drop under the tongue daily., Disp: , Rfl:  .  BIDIL 20-37.5 MG tablet, TAKE (1/2) TABLET THREE TIMES DAILY., Disp: 45 tablet, Rfl: 11 .  carvedilol (COREG) 12.5 MG tablet, TAKE 1 TABLET (12.5 MG TOTAL) BY MOUTH 2 (TWO) TIMES DAILY WITH A MEAL., Disp: 120 tablet, Rfl: 3 .  Cholecalciferol (VITAMIN D3) 2000 units TABS, Take 1 tablet by mouth daily., Disp: , Rfl:  .  furosemide (LASIX) 20 MG tablet, Take 1 tablet (20 mg total) by mouth every other day. Make take extra 20 mg tablet once daily as needed for additional swelling/weight gain., Disp: 15 tablet, Rfl: 6 .  lisinopril (PRINIVIL,ZESTRIL) 20 MG tablet, TAKE 1 TABLET BY MOUTH TWICE A DAY, Disp: 60 tablet, Rfl: 10 .  magnesium oxide (MAG-OX) 400 (241.3 Mg) MG tablet, Take 1 tablet (400 mg total) by mouth daily., Disp: 90 tablet, Rfl: 3 .  spironolactone (ALDACTONE) 25 MG tablet, Take 1 tablet (25 mg total) by mouth daily., Disp: 30 tablet, Rfl: 3  Past Medical History: Past Medical History:  Diagnosis Date  . Allergy   . Biventricular ICD -Medtronic    DOI 12/13 - Medtronic Viva XR CRT-D defibrillator, serial #[id number].     . Chronic systolic heart failure ([place]) 12/02/2011  . Dyslipidemia 12/02/2011  . GERD (gastroesophageal reflux disease)   . Gout    "very seldom" (05/10/2012)  . Hx of colonic polyps   . Hypertension    "used to have this; now my BP is low" (05/10/2012)  . LBBB (left bundle branch block) 12/02/2011  . Nonischemic cardiomyopathy ([place])   . Tubular adenoma 1999   history of    Tobacco Use: History  Smoking Status  . Never Smoker  Smokeless Tobacco  . Never Used    Labs: Recent Review Flowsheet Data    Labs for ITP Cardiac and Pulmonary Rehab Latest Ref Rng & Units 12/01/2011 06/16/2015 01/11/2017   Cholestrol 0 - 200 mg/dL 168 182 127   LDLCALC 0 - 99 mg/dL 125(H) 131(H) 74   HDL >40 mg/dL 28(L) 32(L) 42   Trlycerides <150 mg/dL 73 94 55      Capillary Blood Glucose: No results found for: GLUCAP   Exercise Target Goals:    Exercise Program Goal: Individual exercise prescription set with THRR, safety & activity barriers. Participant demonstrates ability to understand and report RPE using BORG scale, to self-measure pulse accurately, and to acknowledge the importance of the exercise prescription.  Exercise Prescription Goal: Starting with aerobic activity 30 plus minutes a day, 3 days per week for initial exercise prescription. Provide home exercise prescription and  guidelines that participant acknowledges understanding prior to discharge.  Activity Barriers & Risk Stratification:     Activity Barriers & Cardiac Risk Stratification - 10/19/16 1038      Activity Barriers & Cardiac Risk Stratification   Activity Barriers Other (comment);Deconditioning;Muscular Weakness   Comments L sided back; R knee and foot in pain when wearing heels at church   Cardiac Risk Stratification High      6 Minute Walk:     6 Minute Walk    Row Name 10/19/16 0932 10/19/16 1038       6 Minute Walk   Phase Initial  -    Distance 1467 feet  -    Walk Time 6 minutes  -    # of Rest Breaks  0  -    MPH  - 2.78    METS  - 3    RPE 15  -    VO2 Peak  - 10.5    Symptoms No  -    Resting HR  - 85 bpm    Resting BP 101/62  -    Max Ex. HR  - 123 bpm    Max Ex. BP 107/70  -    2 Minute Post BP  - 100/60       Oxygen Initial Assessment:   Oxygen Re-Evaluation:   Oxygen Discharge (Final Oxygen Re-Evaluation):   Initial Exercise Prescription:     Initial Exercise Prescription - 10/19/16 1000      Date of Initial Exercise RX and Referring Provider   Date 10/19/16   Referring Provider Klein,Steven  MD     Bike   Level 0.8   Minutes 10   METs 2.59     NuStep   Level 3   SPM 80   Minutes 10   METs 2     Track   Laps 11   Minutes 10   METs 2.92     Prescription Details   Frequency (times per week) 3   Duration Progress to 30 minutes of continuous aerobic without signs/symptoms of physical distress     Intensity   THRR 40-80% of Max Heartrate 62-125   Ratings of Perceived Exertion 11-13   Perceived Dyspnea 0-4     Progression   Progression Continue to progress workloads to maintain intensity without signs/symptoms of physical distress.     Resistance Training   Training Prescription Yes   Weight 2lbs   Reps 10-15      Perform Capillary Blood Glucose checks as needed.  Exercise Prescription Changes:     Exercise Prescription Changes    Row Name 11/08/16 1700 12/08/16 1700 01/06/17 1500         Response to Exercise   Blood Pressure (Admit) 107/68 98/60 98/60      Blood Pressure (Exercise) 108/70 114/60 114/64     Blood Pressure (Exit) 92/52 108/62 100/64     Heart Rate (Admit) 95 bpm 88 bpm 87 bpm     Heart Rate (Exercise) 123 bpm 121 bpm 127 bpm     Heart Rate (Exit) 93 bpm 82 bpm 89 bpm     Rating of Perceived Exertion (Exercise) 14 11 12      Duration Progress to 45 minutes of aerobic exercise without signs/symptoms of physical distress Progress to 45 minutes of aerobic exercise without signs/symptoms of physical distress Progress to  45 minutes of aerobic exercise without signs/symptoms of physical distress     Intensity THRR unchanged THRR unchanged THRR unchanged  Progression   Progression Continue to progress workloads to maintain intensity without signs/symptoms of physical distress. Continue to progress workloads to maintain intensity without signs/symptoms of physical distress. Continue to progress workloads to maintain intensity without signs/symptoms of physical distress.     Average METs 2.6 2.7 2.9       Resistance Training   Training Prescription Yes Yes Yes     Weight 2lbs 2lbs 2lbs     Reps 10-15 10-15 10-15       Bike   Level 0.8 0.8 0.8     Minutes 10 10 10      METs 2.6 2.6 2.6       NuStep   Level 3 3 4      SPM 80 80 80     Minutes 10 10 10      METs 2.4 2.5 2.6       Track   Laps 14 11 16      Minutes 10 10 10      METs 3.43 2.9 3.43       Home Exercise Plan   Plans to continue exercise at Home (comment) Home (comment) Home (comment)     Frequency Add 3 additional days to program exercise sessions. Add 3 additional days to program exercise sessions. Add 3 additional days to program exercise sessions.     Initial Home Exercises Provided 11/08/16 11/08/16 11/08/16        Exercise Comments:     Exercise Comments    Row Name 11/15/16 6433 12/08/16 1703 01/06/17 1603       Exercise Comments reviewed METs and goals with pt.   reviewed goals with pt and she continues to do well with exercise.  reviewed goals with pt         Exercise Goals and Review:     Exercise Goals    Row Name 10/19/16 0850             Exercise Goals   Increase Physical Activity Yes       Intervention Provide advice, education, support and counseling about physical activity/exercise needs.;Develop an individualized exercise prescription for aerobic and resistive training based on initial evaluation findings, risk stratification, comorbidities and participant's personal goals.       Expected Outcomes  Achievement of increased cardiorespiratory fitness and enhanced flexibility, muscular endurance and strength shown through measurements of functional capacity and personal statement of participant.       Increase Strength and Stamina Yes       Intervention Provide advice, education, support and counseling about physical activity/exercise needs.;Develop an individualized exercise prescription for aerobic and resistive training based on initial evaluation findings, risk stratification, comorbidities and participant's personal goals.       Expected Outcomes Achievement of increased cardiorespiratory fitness and enhanced flexibility, muscular endurance and strength shown through measurements of functional capacity and personal statement of participant.          Exercise Goals Re-Evaluation :     Exercise Goals Re-Evaluation    Row Name 11/15/16 0905 12/08/16 1702 01/06/17 1602         Exercise Goal Re-Evaluation   Exercise Goals Review Increase Physical Activity;Increase Strenth and Stamina Increase Physical Activity;Increase Strenth and Stamina Increase Physical Activity;Increase Strenth and Stamina     Comments pt states that she is still acclimating to an exercise routine but she is doing well with exercise thus far.  pt is doing well with exercise and states that she feels much better than she did in the  beginning.  She feels stronger has more energy and she is more confident with exercise,  Pt continues to do well with exercise and she is tolerating workload increases well.  She also states that she notices she has more energy when she is consistent with exercise.      Expected Outcomes Continue with exercise Rx and increase workloads as tolerated and as pt gets stronger she will begin to feel more comfortable with exercise.  Continue with exercise Rx and increase workloads as tolerated and as pt gets stronger she will begin to feel more comfortable with exercise.  Continue with exercise Rx and  increase workloads as tolerated and as pt gets stronger she will begin to feel more comfortable with exercise.          Discharge Exercise Prescription (Final Exercise Prescription Changes):     Exercise Prescription Changes - 01/06/17 1500      Response to Exercise   Blood Pressure (Admit) 98/60   Blood Pressure (Exercise) 114/64   Blood Pressure (Exit) 100/64   Heart Rate (Admit) 87 bpm   Heart Rate (Exercise) 127 bpm   Heart Rate (Exit) 89 bpm   Rating of Perceived Exertion (Exercise) 12   Duration Progress to 45 minutes of aerobic exercise without signs/symptoms of physical distress   Intensity THRR unchanged     Progression   Progression Continue to progress workloads to maintain intensity without signs/symptoms of physical distress.   Average METs 2.9     Resistance Training   Training Prescription Yes   Weight 2lbs   Reps 10-15     Bike   Level 0.8   Minutes 10   METs 2.6     NuStep   Level 4   SPM 80   Minutes 10   METs 2.6     Track   Laps 16   Minutes 10   METs 3.43     Home Exercise Plan   Plans to continue exercise at Home (comment)   Frequency Add 3 additional days to program exercise sessions.   Initial Home Exercises Provided 11/08/16      Nutrition:  Target Goals: Understanding of nutrition guidelines, daily intake of sodium 1500mg , cholesterol 200mg , calories 30% from fat and 7% or less from saturated fats, daily to have 5 or more servings of fruits and vegetables.  Biometrics:     Pre Biometrics - 10/19/16 0933      Pre Biometrics   Waist Circumference 40.75 inches   Hip Circumference 50 inches   Waist to Hip Ratio 0.82 %   Triceps Skinfold 35 mm   Grip Strength 30 kg   Flexibility 14.5 in   Single Leg Stand 3 seconds       Nutrition Therapy Plan and Nutrition Goals:     Nutrition Therapy & Goals - 11/26/16 0815      Nutrition Therapy   Diet Therapeutic Lifestyle Changes     Personal Nutrition Goals   Nutrition Goal  Wt loss goal of 1-2 lb/week to a wt loss goal of 6-24 lb at graduation.   Personal Goal #2 Pt to identify and limit food sources of saturated fat, trans fat, and sodium     Intervention Plan   Intervention Prescribe, educate and counsel regarding individualized specific dietary modifications aiming towards targeted core components such as weight, hypertension, lipid management, diabetes, heart failure and other comorbidities.   Expected Outcomes Short Term Goal: Understand basic principles of dietary content, such as calories, fat, sodium,  cholesterol and nutrients.;Long Term Goal: Adherence to prescribed nutrition plan.      Nutrition Discharge: Nutrition Scores:     Nutrition Assessments - 11/26/16 0816      MEDFICTS Scores   Pre Score 45      Nutrition Goals Re-Evaluation:   Nutrition Goals Re-Evaluation:   Nutrition Goals Discharge (Final Nutrition Goals Re-Evaluation):   Psychosocial: Target Goals: Acknowledge presence or absence of significant depression and/or stress, maximize coping skills, provide positive support system. Participant is able to verbalize types and ability to use techniques and skills needed for reducing stress and depression.  Initial Review & Psychosocial Screening:     Initial Psych Review & Screening - 10/27/16 0752      Family Dynamics   Concerns Recent loss of significant other   Comments pt mother and grandmother passed away in past 2 years.  pt is still experiencing grief.  pt offered counseling with Jeanella Craze.  pt has also previously considered Hospice counseling.  encouraged her to take advantage of these opportunities.       Barriers   Psychosocial barriers to participate in program The patient should benefit from training in stress management and relaxation.     Screening Interventions   Interventions Encouraged to exercise;To provide support and resources with identified psychosocial needs;Provide feedback about the scores to  participant      Quality of Life Scores:     Quality of Life - 11/10/16 0828      Quality of Life Scores   Health/Function Pre 21.43 %  pt with health related anxiety about recent cardiac event.  pt concerned about fatigue and lack of energy. pt counseled on continuing current regimen including exercise.  will continue to monitor BP and symptoms. will report to Dr. [name] accordingly.   Socioeconomic Pre 20.86 %  pt with stressful job looking forward to retirement soon.     Psych/Spiritual Pre 22.29 %   Family Pre 22.25 %   GLOBAL Pre 21.59 %  pt offered emotional support and reassurance.  pt encouraged to continue exercise and stress management education.        PHQ-9: Recent Review Flowsheet Data    Depression screen [facility] 2/9 10/27/2016   Decreased Interest 0   Down, Depressed, Hopeless 1    PHQ - 2 Score 1     Interpretation of Total Score  Total Score Depression Severity:  1-4 = Minimal depression, 5-9 = Mild depression, 10-14 = Moderate depression, 15-19 = Moderately severe depression, 20-27 = Severe depression   Psychosocial Evaluation and Intervention:     Psychosocial Evaluation - 10/27/16 1716      Psychosocial Evaluation & Interventions   Interventions Stress management education;Relaxation education;Encouraged to exercise with the program and follow exercise prescription   Comments pt mother and grandmother passed away within past 2 years.  pt displays normal grief pattern. pt offered counseling with Jeanella Craze.     Continue Psychosocial Services  Follow up required by staff      Psychosocial Re-Evaluation:     Psychosocial Re-Evaluation    [place] Name 11/16/16 0724 12/14/16 1532 01/07/17 0708         Psychosocial Re-Evaluation   Current issues with Current Stress Concerns Current Stress Concerns Current Stress Concerns;History of Depression     Comments pt with normal grief pattern from recent loss of mother and grandmother. with continued CR  participation pt mood and outlook are improving. pt is looking forward to retirement.   pt mood and  outlook continue to improve.  pt is looking forward to retirement.   pt mood and outlook continue to improve.  pt is looking forward to retirement in a few weeks. pt is looking forward to  trip planned to visit her brother in [place]      Expected Outcomes pt will exhibit positive outlook with good coping skills and normal grief pattern.  pt will exhibit positive outlook with good coping skills and normal grief pattern.  pt will exhibit positive outlook with good coping skills and normal grief pattern.      Interventions Stress management education;Relaxation education;Encouraged to attend Cardiac Rehabilitation for the exercise Stress management education;Relaxation education;Encouraged to attend Cardiac Rehabilitation for the exercise Stress management education;Relaxation education;Encouraged to attend Cardiac Rehabilitation for the exercise     Continue Psychosocial Services  Follow up required by staff Follow up required by staff Follow up required by staff        Psychosocial Discharge (Final Psychosocial Re-Evaluation):     Psychosocial Re-Evaluation - 01/07/17 0708      Psychosocial Re-Evaluation   Current issues with Current Stress Concerns;History of Depression   Comments  pt mood and outlook continue to improve.  pt is looking forward to retirement in a few weeks. pt is looking forward to  trip planned to visit her brother in [place]    Expected Outcomes pt will exhibit positive outlook with good coping skills and normal grief pattern.    Interventions Stress management education;Relaxation education;Encouraged to attend Cardiac Rehabilitation for the exercise   Continue Psychosocial Services  Follow up required by staff      Vocational Rehabilitation: Provide vocational rehab assistance to qualifying candidates.   Vocational Rehab Evaluation & Intervention:     Vocational Rehab  - 10/19/16 1114      Initial Vocational Rehab Evaluation & Intervention   Assessment shows need for Vocational Rehabilitation No      Education: Education Goals: Education classes will be provided on a weekly basis, covering required topics. Participant will state understanding/return demonstration of topics presented.  Learning Barriers/Preferences:     Learning Barriers/Preferences - 10/19/16 0849      Learning Barriers/Preferences   Learning Barriers Sight   Learning Preferences Written Material;Video;Verbal Instruction;Skilled Demonstration;Pictoral      Education Topics: Count Your Pulse:  -Group instruction provided by verbal instruction, demonstration, patient participation and written materials to support subject.  Instructors address importance of being able to find your pulse and how to count your pulse when at home without a heart monitor.  Patients get hands on experience counting their pulse with staff help and individually.   Heart Attack, Angina, and Risk Factor Modification:  -Group instruction provided by verbal instruction, video, and written materials to support subject.  Instructors address signs and symptoms of angina and heart attacks.    Also discuss risk factors for heart disease and how to make changes to improve heart health risk factors.   Functional Fitness:  -Group instruction provided by verbal instruction, demonstration, patient participation, and written materials to support subject.  Instructors address safety measures for doing things around the house.  Discuss how to get up and down off the floor, how to pick things up properly, how to safely get out of a chair without assistance, and balance training.   Meditation and Mindfulness:  -Group instruction provided by verbal instruction, patient participation, and written materials to support subject.  Instructor addresses importance of mindfulness and meditation practice to help reduce stress and  improve  awareness.  Instructor also leads participants through a meditation exercise.    Stretching for Flexibility and Mobility:  -Group instruction provided by verbal instruction, patient participation, and written materials to support subject.  Instructors lead participants through series of stretches that are designed to increase flexibility thus improving mobility.  These stretches are additional exercise for major muscle groups that are typically performed during regular warm up and cool down.   Hands Only CPR:  -Group verbal, video, and participation provides a basic overview of AHA guidelines for community CPR. Role-play of emergencies allow participants the opportunity to practice calling for help and chest compression technique with discussion of AED use.   Hypertension: -Group verbal and written instruction that provides a basic overview of hypertension including the most recent diagnostic guidelines, risk factor reduction with self-care instructions and medication management.    Nutrition I class: Heart Healthy Eating:  -Group instruction provided by PowerPoint slides, verbal discussion, and written materials to support subject matter. The instructor gives an explanation and review of the Therapeutic Lifestyle Changes diet recommendations, which includes a discussion on lipid goals, dietary fat, sodium, fiber, plant stanol/sterol esters, sugar, and the components of a well-balanced, healthy diet.   Nutrition II class: Lifestyle Skills:  -Group instruction provided by PowerPoint slides, verbal discussion, and written materials to support subject matter. The instructor gives an explanation and review of label reading, grocery shopping for heart health, heart healthy recipe modifications, and ways to make healthier choices when eating out.   Diabetes Question & Answer:  -Group instruction provided by PowerPoint slides, verbal discussion, and written materials to support subject  matter. The instructor gives an explanation and review of diabetes co-morbidities, pre- and post-prandial blood glucose goals, pre-exercise blood glucose goals, signs, symptoms, and treatment of hypoglycemia and hyperglycemia, and foot care basics.   Diabetes Blitz:  -Group instruction provided by PowerPoint slides, verbal discussion, and written materials to support subject matter. The instructor gives an explanation and review of the physiology behind type 1 and type 2 diabetes, diabetes medications and rational behind using different medications, pre- and post-prandial blood glucose recommendations and Hemoglobin A1c goals, diabetes diet, and exercise including blood glucose guidelines for exercising safely.    Portion Distortion:  -Group instruction provided by PowerPoint slides, verbal discussion, written materials, and food models to support subject matter. The instructor gives an explanation of serving size versus portion size, changes in portions sizes over the last 20 years, and what consists of a serving from each food group.   Stress Management:  -Group instruction provided by verbal instruction, video, and written materials to support subject matter.  Instructors review role of stress in heart disease and how to cope with stress positively.     Exercising on Your Own:  -Group instruction provided by verbal instruction, power point, and written materials to support subject.  Instructors discuss benefits of exercise, components of exercise, frequency and intensity of exercise, and end points for exercise.  Also discuss use of nitroglycerin and activating EMS.  Review options of places to exercise outside of rehab.  Review guidelines for sex with heart disease.   Cardiac Drugs I:  -Group instruction provided by verbal instruction and written materials to support subject.  Instructor reviews cardiac drug classes: antiplatelets, anticoagulants, beta blockers, and statins.  Instructor  discusses reasons, side effects, and lifestyle considerations for each drug class.   Cardiac Drugs II:  -Group instruction provided by verbal instruction and written materials to support subject.  Instructor reviews cardiac  drug classes: angiotensin converting enzyme inhibitors (ACE-I), angiotensin II receptor blockers (ARBs), nitrates, and calcium channel blockers.  Instructor discusses reasons, side effects, and lifestyle considerations for each drug class.   Anatomy and Physiology of the Circulatory System:  Group verbal and written instruction and models provide basic cardiac anatomy and physiology, with the coronary electrical and arterial systems. Review of: AMI, Angina, Valve disease, Heart Failure, Peripheral Artery Disease, Cardiac Arrhythmia, Pacemakers, and the ICD.   CARDIAC REHAB PHASE II EXERCISE from 10/27/2016 in [place]  Date  10/27/16  Instruction Review Code  2- meets goals/outcomes      Other Education:  -Group or individual verbal, written, or video instructions that support the educational goals of the cardiac rehab program.   Knowledge Questionnaire Score:     Knowledge Questionnaire Score - 10/19/16 0950      Knowledge Questionnaire Score   Pre Score 22/24      Core Components/Risk Factors/Patient Goals at Admission:     Personal Goals and Risk Factors at Admission - 10/19/16 0934      Core Components/Risk Factors/Patient Goals on Admission    Weight Management Yes;Obesity   Intervention Weight Management: Develop a combined nutrition and exercise program designed to reach desired caloric intake, while maintaining appropriate intake of nutrient and fiber, sodium and fats, and appropriate energy expenditure required for the weight goal.;Weight Management: Provide education and appropriate resources to help participant work on and attain dietary goals.;Weight Management/Obesity: Establish reasonable short term and long term  weight goals.;Obesity: Provide education and appropriate resources to help participant work on and attain dietary goals.   Admit Weight 208 lb 1.8 oz (94.4 kg)   Goal Weight: Short Term 198 lb (89.8 kg)   Goal Weight: Long Term 150 lb (68 kg)   Expected Outcomes Short Term: Continue to assess and modify interventions until short term weight is achieved;Long Term: Adherence to nutrition and physical activity/exercise program aimed toward attainment of established weight goal;Weight Loss: Understanding of general recommendations for a balanced deficit meal plan, which promotes 1-2 lb weight loss per week and includes a negative energy balance of [phone] kcal/d;Understanding of distribution of calorie intake throughout the day with the consumption of 4-5 meals/snacks;Understanding recommendations for meals to include 15-35% energy as protein, 25-35% energy from fat, 35-60% energy from carbohydrates, less than 200mg  of dietary cholesterol, 20-35 gm of total fiber daily   Heart Failure Yes   Intervention Provide a combined exercise and nutrition program that is supplemented with education, support and counseling about heart failure. Directed toward relieving symptoms such as shortness of breath, decreased exercise tolerance, and extremity edema.   Expected Outcomes Improve functional capacity of life;Long term: Adoption of self-care skills and reduction of barriers for early signs and symptoms recognition and intervention leading to self-care maintenance.;Short term: Daily weights obtained and reported for increase. Utilizing diuretic protocols set by physician.;Short term: Attendance in program 2-3 days a week with increased exercise capacity. Reported lower sodium intake. Reported increased fruit and vegetable intake. Reports medication compliance.   Hypertension Yes   Intervention Provide education on lifestyle modifcations including regular physical activity/exercise, weight management, moderate sodium  restriction and increased consumption of fresh fruit, vegetables, and low fat dairy, alcohol moderation, and smoking cessation.;Monitor prescription use compliance.   Expected Outcomes Short Term: Continued assessment and intervention until BP is < 140/8mm HG in hypertensive participants. < 130/11mm HG in hypertensive participants with diabetes, heart failure or chronic kidney disease.;Long Term: Maintenance of  blood pressure at goal levels.   Lipids Yes   Intervention Provide education and support for participant on nutrition & aerobic/resistive exercise along with prescribed medications to achieve LDL 70mg , HDL >40mg .   Expected Outcomes Short Term: Participant states understanding of desired cholesterol values and is compliant with medications prescribed. Participant is following exercise prescription and nutrition guidelines.;Long Term: Cholesterol controlled with medications as prescribed, with individualized exercise RX and with personalized nutrition plan. Value goals: LDL < 70mg , HDL > 40 mg.   Stress Yes   Intervention Offer individual and/or small group education and counseling on adjustment to heart disease, stress management and health-related lifestyle change. Teach and support self-help strategies.;Refer participants experiencing significant psychosocial distress to appropriate mental health specialists for further evaluation and treatment. When possible, include family members and significant others in education/counseling sessions.   Expected Outcomes Short Term: Participant demonstrates changes in health-related behavior, relaxation and other stress management skills, ability to obtain effective social support, and compliance with psychotropic medications if prescribed.;Long Term: Emotional wellbeing is indicated by absence of clinically significant psychosocial distress or social isolation.   Personal Goal Other Yes   Personal Goal To be at the appropriate weight for height (healthy BMI  range). Learn HH and diabetic nutrition   Intervention Provide nutrition counseling and exercise programming to assist with weightloss goals and improve dietary habits   Expected Outcomes Pt will have a better understanding of diabetes, learn HH diet, develop an exercise routine to assist with losing weight.      Core Components/Risk Factors/Patient Goals Review:      Goals and Risk Factor Review    Row Name 11/09/16 1629 12/14/16 1532 01/07/17 0708         Core Components/Risk Factors/Patient Goals Review   Personal Goals Review Weight Management/Obesity;Heart Failure;Hypertension;Lipids;Stress Weight Management/Obesity;Heart Failure;Hypertension;Lipids;Stress Weight Management/Obesity;Heart Failure;Hypertension;Lipids;Stress     Review pt has not developed HEP.  However, pt does note increased energy and improved symptoms with M, W, F CR exercise.  pt has not developed HEP.  However, pt does note increased energy and improved symptoms with M, W, F CR exercise.  pt has not developed HEP.  However, pt does note increased energy and improved symptoms with M, W, F CR exercise.      Expected Outcomes pt will continue CR exercise, nutrition, and lifestyle modification education to promote weight loss and reduce overall CAD risk factors.   pt will continue CR exercise, nutrition, and lifestyle modification education to promote weight loss and reduce overall CAD risk factors.   pt will continue CR exercise, nutrition, and lifestyle modification education to promote weight loss and reduce overall CAD risk factors.          Core Components/Risk Factors/Patient Goals at Discharge (Final Review):      Goals and Risk Factor Review - 01/07/17 0708      Core Components/Risk Factors/Patient Goals Review   Personal Goals Review Weight Management/Obesity;Heart Failure;Hypertension;Lipids;Stress   Review pt has not developed HEP.  However, pt does note increased energy and improved symptoms with M, W, F  CR exercise.    Expected Outcomes pt will continue CR exercise, nutrition, and lifestyle modification education to promote weight loss and reduce overall CAD risk factors.        ITP Comments:     ITP Comments    Row Name 10/19/16 [phone] 11/16/16 0724 12/14/16 1532 01/12/17 1649     ITP Comments Dr. Fransico Him, Medical Director  Dr. Fransico Him, Medical Director  Dr. Fransico Him, Medical Director  Dr. Fransico Him, Medical Director        Comments:  Pt is making expected progress toward personal goals after completing 28 sessions. Recommend continued exercise and life style modification education including  stress management and relaxation techniques to decrease cardiac risk profile.

## 2017-01-14 ENCOUNTER — Encounter (HOSPITAL_COMMUNITY): Payer: Self-pay | Admitting: *Deleted

## 2017-01-14 ENCOUNTER — Encounter (HOSPITAL_COMMUNITY)
Admission: RE | Admit: 2017-01-14 | Discharge: 2017-01-14 | Disposition: A | Discharge status: Home / Self Care | Payer: BC Managed Care – PPO | Source: Ambulatory Visit | Attending: Internal Medicine | Admitting: Internal Medicine

## 2017-01-14 DIAGNOSIS — I5022 Chronic systolic (congestive) heart failure: Secondary | ICD-10-CM

## 2017-01-17 ENCOUNTER — Encounter (HOSPITAL_COMMUNITY): Payer: BC Managed Care – PPO

## 2017-01-18 ENCOUNTER — Other Ambulatory Visit: Payer: Self-pay

## 2017-01-18 ENCOUNTER — Ambulatory Visit (HOSPITAL_COMMUNITY): Payer: BC Managed Care – PPO | Attending: Cardiovascular Disease

## 2017-01-18 DIAGNOSIS — I5022 Chronic systolic (congestive) heart failure: Secondary | ICD-10-CM | POA: Diagnosis present

## 2017-01-18 DIAGNOSIS — E785 Hyperlipidemia, unspecified: Secondary | ICD-10-CM | POA: Diagnosis not present

## 2017-01-18 DIAGNOSIS — I428 Other cardiomyopathies: Secondary | ICD-10-CM | POA: Insufficient documentation

## 2017-01-18 DIAGNOSIS — I447 Left bundle-branch block, unspecified: Secondary | ICD-10-CM | POA: Diagnosis not present

## 2017-01-18 DIAGNOSIS — I081 Rheumatic disorders of both mitral and tricuspid valves: Secondary | ICD-10-CM | POA: Insufficient documentation

## 2017-01-18 DIAGNOSIS — I110 Hypertensive heart disease with heart failure: Secondary | ICD-10-CM | POA: Insufficient documentation

## 2017-01-19 ENCOUNTER — Encounter (HOSPITAL_COMMUNITY)
Admission: RE | Admit: 2017-01-19 | Discharge: 2017-01-19 | Disposition: A | Discharge status: Home / Self Care | Payer: BC Managed Care – PPO | Source: Ambulatory Visit | Attending: Internal Medicine | Admitting: Internal Medicine

## 2017-01-19 DIAGNOSIS — I5022 Chronic systolic (congestive) heart failure: Secondary | ICD-10-CM

## 2017-01-21 ENCOUNTER — Telehealth (HOSPITAL_COMMUNITY): Payer: Self-pay | Admitting: *Deleted

## 2017-01-21 ENCOUNTER — Encounter (HOSPITAL_COMMUNITY)
Admission: RE | Admit: 2017-01-21 | Discharge: 2017-01-21 | Disposition: A | Discharge status: Home / Self Care | Payer: BC Managed Care – PPO | Source: Ambulatory Visit | Attending: Internal Medicine | Admitting: Internal Medicine

## 2017-01-21 DIAGNOSIS — I5022 Chronic systolic (congestive) heart failure: Secondary | ICD-10-CM | POA: Diagnosis not present

## 2017-01-21 NOTE — Telephone Encounter (Signed)
ECHOCARDIOGRAM COMPLETE  Order: [id number]  Status:  Final result Visible to patient:  No (Not Released) Dx:  Chronic systolic heart failure ([place])  Notes recorded by Darron Doom, RN on 01/21/2017 at 10:34 AM EDT Called and spoke with patient she is aware and no further questions. ------  Notes recorded by Larey Dresser, MD on 01/19/2017 at 4:55 PM EDT EF 50-55%, low normal.

## 2017-01-21 NOTE — Progress Notes (Signed)
Daily Session Note  Patient Details  Name: Cynthia Roth MRN: [id number] Date of Birth: 01/27/1951 Referring Provider:     CARDIAC REHAB PHASE II ORIENTATION from 10/19/2016 in [facility]  Referring Provider  Klein,Steven  MD      Encounter Date: 01/21/2017  Check In:     Session Check In - 01/21/17 0634      Check-In   Location [facility]   Staff Present Portia Payne, RN, BSN;Joann Rion, RN, BSN;Molly diVincenzo, MS, ACSM RCEP, Exercise Physiologist;Olinty Richards, MS, ACSM CEP, Exercise Physiologist   Supervising physician immediately available to respond to emergencies [facility] immediately available   Physician(s) Dr. Dhungel   Medication changes reported     No   Fall or balance concerns reported    No   Tobacco Cessation No Change   Warm-up and Cool-down Performed as group-led instruction   Resistance Training Performed Yes   VAD Patient? No     Pain Assessment   Currently in Pain? No/denies   Multiple Pain Sites No      Capillary Blood Glucose: No results found for this or any previous visit (from the past 24 hour(s)).    History  Smoking Status  . Never Smoker  Smokeless Tobacco  . Never Used    Goals Met:  Independence with exercise equipment  Goals Unmet:  Pt c/o dizziness with walk test   Comments: Pt c/o dizziness post walk test today at cardiac rehab. BP: 91/44, HR: 94 sitting, 87/46, HR: 95 standing. \immediately post test.  Max HR: 142, BP: 112/72 during walk test. Pt given banana and gatorade. Repeat BP: 98/56, HR:77 sitting, 97/57, HR: 85 standing. Symptoms resolved. Dr. McLean made aware.    Dr. Traci Turner is Medical Director for Cardiac Rehab at [facility]. 

## 2017-01-24 ENCOUNTER — Encounter (HOSPITAL_COMMUNITY)
Admission: RE | Admit: 2017-01-24 | Discharge: 2017-01-24 | Disposition: A | Discharge status: Home / Self Care | Payer: BC Managed Care – PPO | Source: Ambulatory Visit | Attending: Internal Medicine | Admitting: Internal Medicine

## 2017-01-24 ENCOUNTER — Encounter (HOSPITAL_COMMUNITY): Payer: Self-pay

## 2017-01-24 VITALS — BP 102/50 | HR 93 | Ht 63.5 in | Wt 207.9 lb

## 2017-01-24 DIAGNOSIS — I5022 Chronic systolic (congestive) heart failure: Secondary | ICD-10-CM

## 2017-01-24 NOTE — Progress Notes (Signed)
Discharge Progress Report  Patient Details  Name: CAEDYN TASSINARI MRN: [id number] Date of Birth: 28-Nov-1950 Referring Provider:     CARDIAC REHAB PHASE II ORIENTATION from 10/19/2016 in [place]  Referring Provider  Klein,Steven  MD       Number of Visits: 70  Reason for Discharge:  Patient has met program and personal goals.  Smoking History:  History  Smoking Status  . Never Smoker  Smokeless Tobacco  . Never Used    Diagnosis:  Chronic systolic congestive heart failure ([place])  ADL [facility]:   Initial Exercise Prescription:     Initial Exercise Prescription - 10/19/16 1000      Date of Initial Exercise RX and Referring Provider   Date 10/19/16   Referring Provider Klein,Steven  MD     Bike   Level 0.8   Minutes 10   METs 2.59     NuStep   Level 3   SPM 80   Minutes 10   METs 2     Track   Laps 11   Minutes 10   METs 2.92     Prescription Details   Frequency (times per week) 3   Duration Progress to 30 minutes of continuous aerobic without signs/symptoms of physical distress     Intensity   THRR 40-80% of Max Heartrate 62-125   Ratings of Perceived Exertion 11-13   Perceived Dyspnea 0-4     Progression   Progression Continue to progress workloads to maintain intensity without signs/symptoms of physical distress.     Resistance Training   Training Prescription Yes   Weight 2lbs   Reps 10-15      Discharge Exercise Prescription (Final Exercise Prescription Changes):     Exercise Prescription Changes - 01/19/17 1700      Response to Exercise   Blood Pressure (Admit) 104/62   Blood Pressure (Exercise) 108/70   Blood Pressure (Exit) 100/72   Heart Rate (Admit) 82 bpm   Heart Rate (Exercise) 112 bpm   Heart Rate (Exit) 75 bpm   Rating of Perceived Exertion (Exercise) 12   Symptoms none   Duration Progress to 45 minutes of aerobic exercise without signs/symptoms of physical distress   Intensity THRR  unchanged     Progression   Progression Continue to progress workloads to maintain intensity without signs/symptoms of physical distress.   Average METs 2.9     Resistance Training   Training Prescription Yes   Weight 2lbs   Reps 10-15     Interval Training   Interval Training No     Bike   Level 0.8   Minutes 10   METs 2.58     NuStep   Level 4   SPM 80   Minutes 10   METs 2.9     Track   Laps 12   Minutes 10   METs 3.09     Home Exercise Plan   Plans to continue exercise at Home (comment)   Frequency Add 3 additional days to program exercise sessions.   Initial Home Exercises Provided 11/08/16      Functional Capacity:     6 Minute Walk    Row Name 10/19/16 0932 10/19/16 1038       6 Minute Walk   Phase Initial  -    Distance 1467 feet  -    Walk Time 6 minutes  -    # of Rest Breaks 0  -    MPH  -  2.78    METS  - 3    RPE 15  -    VO2 Peak  - 10.5    Symptoms No  -    Resting HR  - 85 bpm    Resting BP 101/62  -    Max Ex. HR  - 123 bpm    Max Ex. BP 107/70  -    2 Minute Post BP  - 100/60       Psychological, QOL, Others - Outcomes: PHQ 2/9: Depression screen PHQ 2/9 10/27/2016  Decreased Interest 0  Down, Depressed, Hopeless 1  PHQ - 2 Score 1    Quality of Life:     Quality of Life - 11/10/16 0828      Quality of Life Scores   Health/Function Pre 21.43 %  pt with health related anxiety about recent cardiac event.  pt concerned about fatigue and lack of energy. pt counseled on continuing current regimen including exercise.  will continue to monitor BP and symptoms. will report to Dr. [name] accordingly.   Socioeconomic Pre 20.86 %  pt with stressful job looking forward to retirement soon.     Psych/Spiritual Pre 22.29 %   Family Pre 22.25 %   GLOBAL Pre 21.59 %  pt offered emotional support and reassurance.  pt encouraged to continue exercise and stress management education.        Personal Goals: Goals established at  orientation with interventions provided to work toward goal.     Personal Goals and Risk Factors at Admission - 10/19/16 0934      Core Components/Risk Factors/Patient Goals on Admission    Weight Management Yes;Obesity   Intervention Weight Management: Develop a combined nutrition and exercise program designed to reach desired caloric intake, while maintaining appropriate intake of nutrient and fiber, sodium and fats, and appropriate energy expenditure required for the weight goal.;Weight Management: Provide education and appropriate resources to help participant work on and attain dietary goals.;Weight Management/Obesity: Establish reasonable short term and long term weight goals.;Obesity: Provide education and appropriate resources to help participant work on and attain dietary goals.   Admit Weight 208 lb 1.8 oz (94.4 kg)   Goal Weight: Short Term 198 lb (89.8 kg)   Goal Weight: Long Term 150 lb (68 kg)   Expected Outcomes Short Term: Continue to assess and modify interventions until short term weight is achieved;Long Term: Adherence to nutrition and physical activity/exercise program aimed toward attainment of established weight goal;Weight Loss: Understanding of general recommendations for a balanced deficit meal plan, which promotes 1-2 lb weight loss per week and includes a negative energy balance of [phone] kcal/d;Understanding of distribution of calorie intake throughout the day with the consumption of 4-5 meals/snacks;Understanding recommendations for meals to include 15-35% energy as protein, 25-35% energy from fat, 35-60% energy from carbohydrates, less than '200mg'$  of dietary cholesterol, 20-35 gm of total fiber daily   Heart Failure Yes   Intervention Provide a combined exercise and nutrition program that is supplemented with education, support and counseling about heart failure. Directed toward relieving symptoms such as shortness of breath, decreased exercise tolerance, and extremity  edema.   Expected Outcomes Improve functional capacity of life;Long term: Adoption of self-care skills and reduction of barriers for early signs and symptoms recognition and intervention leading to self-care maintenance.;Short term: Daily weights obtained and reported for increase. Utilizing diuretic protocols set by physician.;Short term: Attendance in program 2-3 days a week with increased exercise capacity. Reported lower sodium intake. Reported  increased fruit and vegetable intake. Reports medication compliance.   Hypertension Yes   Intervention Provide education on lifestyle modifcations including regular physical activity/exercise, weight management, moderate sodium restriction and increased consumption of fresh fruit, vegetables, and low fat dairy, alcohol moderation, and smoking cessation.;Monitor prescription use compliance.   Expected Outcomes Short Term: Continued assessment and intervention until BP is < 140/73m HG in hypertensive participants. < 130/867mHG in hypertensive participants with diabetes, heart failure or chronic kidney disease.;Long Term: Maintenance of blood pressure at goal levels.   Lipids Yes   Intervention Provide education and support for participant on nutrition & aerobic/resistive exercise along with prescribed medications to achieve LDL '70mg'$ , HDL >'40mg'$ .   Expected Outcomes Short Term: Participant states understanding of desired cholesterol values and is compliant with medications prescribed. Participant is following exercise prescription and nutrition guidelines.;Long Term: Cholesterol controlled with medications as prescribed, with individualized exercise RX and with personalized nutrition plan. Value goals: LDL < '70mg'$ , HDL > 40 mg.   Stress Yes   Intervention Offer individual and/or small group education and counseling on adjustment to heart disease, stress management and health-related lifestyle change. Teach and support self-help strategies.;Refer participants  experiencing significant psychosocial distress to appropriate mental health specialists for further evaluation and treatment. When possible, include family members and significant others in education/counseling sessions.   Expected Outcomes Short Term: Participant demonstrates changes in health-related behavior, relaxation and other stress management skills, ability to obtain effective social support, and compliance with psychotropic medications if prescribed.;Long Term: Emotional wellbeing is indicated by absence of clinically significant psychosocial distress or social isolation.   Personal Goal Other Yes   Personal Goal To be at the appropriate weight for height (healthy BMI range). Learn HH and diabetic nutrition   Intervention Provide nutrition counseling and exercise programming to assist with weightloss goals and improve dietary habits   Expected Outcomes Pt will have a better understanding of diabetes, learn HH diet, develop an exercise routine to assist with losing weight.       Personal Goals Discharge:     Goals and Risk Factor Review    Row Name 11/09/16 1629 12/14/16 1532 01/07/17 0708         Core Components/Risk Factors/Patient Goals Review   Personal Goals Review Weight Management/Obesity;Heart Failure;Hypertension;Lipids;Stress Weight Management/Obesity;Heart Failure;Hypertension;Lipids;Stress Weight Management/Obesity;Heart Failure;Hypertension;Lipids;Stress     Review pt has not developed HEP.  However, pt does note increased energy and improved symptoms with M, W, F CR exercise.  pt has not developed HEP.  However, pt does note increased energy and improved symptoms with M, W, F CR exercise.  pt has not developed HEP.  However, pt does note increased energy and improved symptoms with M, W, F CR exercise.      Expected Outcomes pt will continue CR exercise, nutrition, and lifestyle modification education to promote weight loss and reduce overall CAD risk factors.   pt will  continue CR exercise, nutrition, and lifestyle modification education to promote weight loss and reduce overall CAD risk factors.   pt will continue CR exercise, nutrition, and lifestyle modification education to promote weight loss and reduce overall CAD risk factors.          Exercise Goals and Review:     Exercise Goals    Row Name 10/19/16 0850             Exercise Goals   Increase Physical Activity Yes       Intervention Provide advice, education, support and counseling about physical  activity/exercise needs.;Develop an individualized exercise prescription for aerobic and resistive training based on initial evaluation findings, risk stratification, comorbidities and participant's personal goals.       Expected Outcomes Achievement of increased cardiorespiratory fitness and enhanced flexibility, muscular endurance and strength shown through measurements of functional capacity and personal statement of participant.       Increase Strength and Stamina Yes       Intervention Provide advice, education, support and counseling about physical activity/exercise needs.;Develop an individualized exercise prescription for aerobic and resistive training based on initial evaluation findings, risk stratification, comorbidities and participant's personal goals.       Expected Outcomes Achievement of increased cardiorespiratory fitness and enhanced flexibility, muscular endurance and strength shown through measurements of functional capacity and personal statement of participant.          Nutrition & Weight - Outcomes:     Pre Biometrics - 10/19/16 0933      Pre Biometrics   Waist Circumference 40.75 inches   Hip Circumference 50 inches   Waist to Hip Ratio 0.82 %   Triceps Skinfold 35 mm   Grip Strength 30 kg   Flexibility 14.5 in   Single Leg Stand 3 seconds       Nutrition:     Nutrition Therapy & Goals - 11/26/16 0815      Nutrition Therapy   Diet Therapeutic Lifestyle Changes      Personal Nutrition Goals   Nutrition Goal Wt loss goal of 1-2 lb/week to a wt loss goal of 6-24 lb at graduation.   Personal Goal #2 Pt to identify and limit food sources of saturated fat, trans fat, and sodium     Intervention Plan   Intervention Prescribe, educate and counsel regarding individualized specific dietary modifications aiming towards targeted core components such as weight, hypertension, lipid management, diabetes, heart failure and other comorbidities.   Expected Outcomes Short Term Goal: Understand basic principles of dietary content, such as calories, fat, sodium, cholesterol and nutrients.;Long Term Goal: Adherence to prescribed nutrition plan.      Nutrition Discharge:     Nutrition Assessments - 11/26/16 0816      MEDFICTS Scores   Pre Score 45      Education Questionnaire Score:     Knowledge Questionnaire Score - 10/19/16 0950      Knowledge Questionnaire Score   Pre Score 22/24      Goals reviewed with patient; copy given to patient.

## 2017-01-25 ENCOUNTER — Other Ambulatory Visit (HOSPITAL_COMMUNITY): Payer: Self-pay | Admitting: Student

## 2017-01-26 ENCOUNTER — Encounter (HOSPITAL_COMMUNITY): Admission: RE | Admit: 2017-01-26 | Payer: BC Managed Care – PPO | Source: Ambulatory Visit

## 2017-01-28 ENCOUNTER — Encounter (HOSPITAL_COMMUNITY): Payer: BC Managed Care – PPO

## 2017-02-09 ENCOUNTER — Other Ambulatory Visit: Payer: Self-pay | Admitting: Gastroenterology

## 2017-02-10 NOTE — Progress Notes (Signed)
Cynthia Eichler MD 

## 2017-02-15 ENCOUNTER — Telehealth (HOSPITAL_COMMUNITY): Payer: Self-pay | Admitting: *Deleted

## 2017-02-15 NOTE — Telephone Encounter (Signed)
Received note from [facility], pt needs colonoscopy w/Dr Collene Mares on 02/21/17.  Per Dr [name] pt is low risk for anesthesia  Form faxed back to them at [phone]

## 2017-03-01 NOTE — Addendum Note (Signed)
Encounter addended by: Sol Passer on: 03/01/2017 10:12 AM<BR>    Actions taken: Flowsheet accepted, Vitals modified, Flowsheet data copied forward, Visit Navigator Flowsheet section accepted

## 2017-03-02 NOTE — Addendum Note (Signed)
Encounter addended by: Jewel Baize, RD on: 03/02/2017 10:18 AM<BR>    Actions taken: Flowsheet data copied forward, Visit Navigator Flowsheet section accepted

## 2017-03-03 ENCOUNTER — Ambulatory Visit (INDEPENDENT_AMBULATORY_CARE_PROVIDER_SITE_OTHER): Payer: BC Managed Care – PPO | Admitting: *Deleted

## 2017-03-03 DIAGNOSIS — I428 Other cardiomyopathies: Secondary | ICD-10-CM

## 2017-03-03 DIAGNOSIS — I5022 Chronic systolic (congestive) heart failure: Secondary | ICD-10-CM | POA: Diagnosis not present

## 2017-03-03 NOTE — Progress Notes (Signed)
Remote ICD transmission.   

## 2017-03-04 LAB — CUP PACEART REMOTE DEVICE CHECK
Battery Remaining Longevity: 17 mo
Battery Voltage: 2.91 V
Brady Statistic AP VP Percent: 0.01 %
Brady Statistic AP VS Percent: 0.04 %
Brady Statistic AS VP Percent: 98.65 %
Brady Statistic AS VS Percent: 1.3 %
Brady Statistic RA Percent Paced: 0.05 %
Brady Statistic RV Percent Paced: 5.23 %
Date Time Interrogation Session: 20181004052307
HighPow Impedance: 88 Ohm
Implantable Lead Implant Date: 20131211
Implantable Lead Implant Date: 20131211
Implantable Lead Implant Date: 20131211
Implantable Lead Location: 753858
Implantable Lead Location: 753859
Implantable Lead Location: 753860
Implantable Lead Model: 181
Implantable Lead Model: 4396
Implantable Lead Model: 5076
Implantable Lead Serial Number: 32342
Implantable Pulse Generator Implant Date: 20131211
Lead Channel Impedance Value: 1121 Ohm
Lead Channel Impedance Value: 399 Ohm
Lead Channel Impedance Value: 646 Ohm
Lead Channel Impedance Value: 665 Ohm
Lead Channel Impedance Value: 722 Ohm
Lead Channel Impedance Value: 722 Ohm
Lead Channel Pacing Threshold Amplitude: 0.5 V
Lead Channel Pacing Threshold Amplitude: 0.625 V
Lead Channel Pacing Threshold Amplitude: 3.25 V
Lead Channel Pacing Threshold Pulse Width: 0.4 ms
Lead Channel Pacing Threshold Pulse Width: 0.4 ms
Lead Channel Pacing Threshold Pulse Width: 0.8 ms
Lead Channel Sensing Intrinsic Amplitude: 11.375 mV
Lead Channel Sensing Intrinsic Amplitude: 11.375 mV
Lead Channel Sensing Intrinsic Amplitude: 4.75 mV
Lead Channel Sensing Intrinsic Amplitude: 4.75 mV
Lead Channel Setting Pacing Amplitude: 2 V
Lead Channel Setting Pacing Amplitude: 2.5 V
Lead Channel Setting Pacing Amplitude: 3.25 V
Lead Channel Setting Pacing Pulse Width: 0.4 ms
Lead Channel Setting Pacing Pulse Width: 0.8 ms
Lead Channel Setting Sensing Sensitivity: 0.45 mV

## 2017-03-07 ENCOUNTER — Encounter (HOSPITAL_COMMUNITY): Payer: Self-pay | Admitting: *Deleted

## 2017-03-10 ENCOUNTER — Encounter: Payer: Self-pay | Admitting: Cardiology

## 2017-03-16 NOTE — Progress Notes (Signed)
Device orders faxed 03-07-17 and 03-15-17, no device orders received

## 2017-03-16 NOTE — Anesthesia Preprocedure Evaluation (Addendum)
Anesthesia Evaluation   Patient awake    Reviewed: Allergy & Precautions, NPO status , Patient's Chart, lab work & pertinent test results  History of Anesthesia Complications Negative for: history of anesthetic complications  Airway Mallampati: II  TM Distance: >3 FB Neck ROM: Full    Dental no notable dental hx. (+) Dental Advisory Given   Pulmonary neg pulmonary ROS,    Pulmonary exam normal        Cardiovascular hypertension, negative cardio ROS Normal cardiovascular exam+ dysrhythmias + Cardiac Defibrillator   Study Conclusions  - Left ventricle: The cavity size was normal. Wall thickness was   normal. Systolic function was normal. The estimated ejection   fraction was in the range of 50% to 55%. Wall motion was normal;   Neuro/Psych negative neurological ROS     GI/Hepatic negative GI ROS, Neg liver ROS,   Endo/Other  negative endocrine ROS  Renal/GU negative Renal ROS     Musculoskeletal negative musculoskeletal ROS (+)   Abdominal   Peds  Hematology negative hematology ROS (+)   Anesthesia Other Findings Day of surgery medications reviewed with the patient.  Reproductive/Obstetrics                            Anesthesia Physical Anesthesia Plan  ASA: III  Anesthesia Plan: MAC   Post-op Pain Management:    Induction:   PONV Risk Score and Plan: 2 and Ondansetron and Dexamethasone  Airway Management Planned: Natural Airway  Additional Equipment:   Intra-op Plan:   Post-operative Plan:   Informed Consent: I have reviewed the patients History and Physical, chart, labs and discussed the procedure including the risks, benefits and alternatives for the proposed anesthesia with the patient or authorized representative who has indicated his/her understanding and acceptance.   Dental advisory given  Plan Discussed with: CRNA and Anesthesiologist  Anesthesia Plan  Comments:        Anesthesia Quick Evaluation

## 2017-03-17 ENCOUNTER — Ambulatory Visit (HOSPITAL_COMMUNITY): Payer: Medicare Other | Admitting: Anesthesiology

## 2017-03-17 ENCOUNTER — Encounter (HOSPITAL_COMMUNITY): Payer: Self-pay | Admitting: *Deleted

## 2017-03-17 ENCOUNTER — Ambulatory Visit (HOSPITAL_COMMUNITY)
Admission: RE | Admit: 2017-03-17 | Discharge: 2017-03-17 | Disposition: A | Discharge status: Home / Self Care | Payer: Medicare Other | Source: Ambulatory Visit | Attending: Gastroenterology | Admitting: Gastroenterology

## 2017-03-17 ENCOUNTER — Encounter (HOSPITAL_COMMUNITY)
Admission: RE | Disposition: A | Discharge status: Home / Self Care | Payer: Self-pay | Source: Ambulatory Visit | Attending: Gastroenterology

## 2017-03-17 DIAGNOSIS — I5022 Chronic systolic (congestive) heart failure: Secondary | ICD-10-CM | POA: Insufficient documentation

## 2017-03-17 DIAGNOSIS — Z86010 Personal history of colonic polyps: Secondary | ICD-10-CM | POA: Diagnosis not present

## 2017-03-17 DIAGNOSIS — I429 Cardiomyopathy, unspecified: Secondary | ICD-10-CM | POA: Diagnosis not present

## 2017-03-17 DIAGNOSIS — Z79899 Other long term (current) drug therapy: Secondary | ICD-10-CM | POA: Insufficient documentation

## 2017-03-17 DIAGNOSIS — Z95810 Presence of automatic (implantable) cardiac defibrillator: Secondary | ICD-10-CM | POA: Insufficient documentation

## 2017-03-17 DIAGNOSIS — Z7982 Long term (current) use of aspirin: Secondary | ICD-10-CM | POA: Diagnosis not present

## 2017-03-17 DIAGNOSIS — Z8249 Family history of ischemic heart disease and other diseases of the circulatory system: Secondary | ICD-10-CM | POA: Insufficient documentation

## 2017-03-17 DIAGNOSIS — E785 Hyperlipidemia, unspecified: Secondary | ICD-10-CM | POA: Insufficient documentation

## 2017-03-17 DIAGNOSIS — Z1211 Encounter for screening for malignant neoplasm of colon: Secondary | ICD-10-CM | POA: Insufficient documentation

## 2017-03-17 DIAGNOSIS — Z888 Allergy status to other drugs, medicaments and biological substances status: Secondary | ICD-10-CM | POA: Diagnosis not present

## 2017-03-17 DIAGNOSIS — I110 Hypertensive heart disease with heart failure: Secondary | ICD-10-CM | POA: Insufficient documentation

## 2017-03-17 DIAGNOSIS — I10 Essential (primary) hypertension: Secondary | ICD-10-CM | POA: Diagnosis not present

## 2017-03-17 DIAGNOSIS — I447 Left bundle-branch block, unspecified: Secondary | ICD-10-CM | POA: Diagnosis not present

## 2017-03-17 DIAGNOSIS — K5730 Diverticulosis of large intestine without perforation or abscess without bleeding: Secondary | ICD-10-CM | POA: Diagnosis not present

## 2017-03-17 HISTORY — DX: Presence of cardiac pacemaker: Z95.0

## 2017-03-17 HISTORY — DX: Presence of automatic (implantable) cardiac defibrillator: Z95.810

## 2017-03-17 HISTORY — DX: Prediabetes: R73.03

## 2017-03-17 HISTORY — PX: COLONOSCOPY WITH PROPOFOL: SHX5780

## 2017-03-17 LAB — GLUCOSE, CAPILLARY: Glucose-Capillary: 96 mg/dL (ref 65–99)

## 2017-03-17 SURGERY — COLONOSCOPY WITH PROPOFOL
Anesthesia: Monitor Anesthesia Care

## 2017-03-17 MED ORDER — PROPOFOL 500 MG/50ML IV EMUL
INTRAVENOUS | Status: DC | PRN
  Administered 2017-03-17: 75 ug/kg/min via INTRAVENOUS

## 2017-03-17 MED ORDER — MIDAZOLAM HCL 5 MG/5ML IJ SOLN
INTRAMUSCULAR | Status: DC | PRN
  Administered 2017-03-17: 2 mg via INTRAVENOUS

## 2017-03-17 MED ORDER — LACTATED RINGERS IV SOLN
INTRAVENOUS | Status: DC
  Administered 2017-03-17: 07:00:00 via INTRAVENOUS
  Administered 2017-03-17: 1000 mL via INTRAVENOUS

## 2017-03-17 MED ORDER — PROPOFOL 500 MG/50ML IV EMUL
INTRAVENOUS | Status: DC | PRN
  Administered 2017-03-17 (×2): 30 mg via INTRAVENOUS

## 2017-03-17 MED ORDER — ONDANSETRON HCL 4 MG/2ML IJ SOLN
INTRAMUSCULAR | Status: DC | PRN
  Administered 2017-03-17: 4 mg via INTRAVENOUS

## 2017-03-17 MED ORDER — ONDANSETRON HCL 4 MG/2ML IJ SOLN
INTRAMUSCULAR | Status: AC
  Filled 2017-03-17: qty 6

## 2017-03-17 MED ORDER — PROPOFOL 10 MG/ML IV BOLUS
INTRAVENOUS | Status: AC
  Filled 2017-03-17: qty 40

## 2017-03-17 MED ORDER — SODIUM CHLORIDE 0.9 % IV SOLN: INTRAVENOUS | Status: DC

## 2017-03-17 MED ORDER — MIDAZOLAM HCL 2 MG/2ML IJ SOLN
INTRAMUSCULAR | Status: AC
  Filled 2017-03-17: qty 2

## 2017-03-17 SURGICAL SUPPLY — 21 items

## 2017-03-17 NOTE — H&P (Signed)
Cynthia Roth is an 66 y.o. female.   Chief Complaint: Colorectal cancer screening. HPI: 66 year old black female for colorectal cancer screening. See office notes for details.  Past Medical History:  Diagnosis Date  . AICD (automatic cardioverter/defibrillator) present   . Allergy   . Biventricular ICD -Medtronic    DOI 12/13 - Medtronic Viva XR CRT-D defibrillator, serial #[id number] H.    . CHF (congestive heart failure) ([place])   . Chronic systolic heart failure ([place]) 12/02/2011  . Dyslipidemia 12/02/2011  . Gout    "very seldom" (05/10/2012)  . Hx of colonic polyps   . Hypertension    "used to have this; now my BP is low" (05/10/2012)  . LBBB (left bundle branch block) 12/02/2011  . Nonischemic cardiomyopathy ([place])   . Pre-diabetes   . Presence of permanent cardiac pacemaker   . Tubular adenoma 1999   history of   Past Surgical History:  Procedure Laterality Date  . BI-VENTRICULAR IMPLANTABLE CARDIOVERTER DEFIBRILLATOR N/A 05/10/2012   Procedure: BI-VENTRICULAR IMPLANTABLE CARDIOVERTER DEFIBRILLATOR  (CRT-D);  Surgeon: Deboraha Sprang, MD;  Location: [facility] C[facility] LAB;  Service: Cardiovascular;  Laterality: N/A;  . CARDIAC CATHETERIZATION  12-03-11   selectie coronary angiography  . CARDIAC DEFIBRILLATOR PLACEMENT  05/10/2012   CRT-D implantation (05/10/2012)  . LEFT HEART CATHETERIZATION WITH CORONARY ANGIOGRAM N/A 12/03/2011   Procedure: LEFT HEART CATHETERIZATION WITH CORONARY ANGIOGRAM;  Surgeon: Larey Dresser, MD;  Location: [facility] CATH LAB;  Service: Cardiovascular;  Laterality: N/A;  . PARTIAL HYSTERECTOMY  1980's   Family History  Problem Relation Age of Onset  . Angina Mother        diagnosed at age of 37's, unsure whether it was a true diagnosis. unsure about the treatment  . Memory loss Mother   . Sleep apnea Brother   . Kidney disease Father   . Diabetes Brother   . Heart failure Maternal Grandmother        diagnosed in age of 90's. still living at age of 28's.  Cynthia Roth Heart  attack Maternal Grandmother   . Stroke Maternal Grandmother   . Colon cancer Unknown        family history  . Hypertension Neg Hx    Social History:  reports that she has never smoked. She has never used smokeless tobacco. She reports that she does not drink alcohol or use drugs.  Allergies:  Allergies  Allergen Reactions  . Decongestant [Pseudoephedrine Hcl Er]     PATIENT CAN'T REMEMBER    Medications Prior to Admission  Medication Sig Dispense Refill  . aspirin 81 MG tablet Take 81 mg by mouth daily.    Cynthia Roth atorvastatin (LIPITOR) 20 MG tablet Take 1 tablet (20 mg total) by mouth daily. 90 tablet 3  . B COMPLEX VITAMINS SL Place 1 drop under the tongue daily.    Cynthia Roth BIDIL 20-37.5 MG tablet TAKE (1/2) TABLET THREE TIMES DAILY. (Patient taking differently: TAKE (1/2) TABLET three times daily) 45 tablet 11  . carvedilol (COREG) 12.5 MG tablet TAKE 1 TABLET (12.5 MG TOTAL) BY MOUTH 2 (TWO) TIMES DAILY WITH A MEAL. 120 tablet 3  . Cholecalciferol (VITAMIN D3) 2000 units TABS Take 1 tablet by mouth daily.    . Cinnamon 500 MG capsule Take 1,000 mg by mouth daily.    . furosemide (LASIX) 20 MG tablet Take 1 tablet (20 mg total) by mouth every other day. Make take extra 20 mg tablet once daily as needed for additional  swelling/weight gain. 15 tablet 6  . lisinopril (PRINIVIL,ZESTRIL) 20 MG tablet TAKE 1 TABLET BY MOUTH TWICE A DAY 60 tablet 10  . magnesium oxide (MAG-OX) 400 (241.3 Mg) MG tablet Take 1 tablet (400 mg total) by mouth daily. 90 tablet 3  . spironolactone (ALDACTONE) 25 MG tablet TAKE 1 TABLET BY MOUTH EVERY DAY 30 tablet 3   Results for orders placed or performed during the hospital encounter of 03/17/17 (from the past 48 hour(s))  Glucose, capillary     Status: None   Collection Time: 03/17/17  6:46 AM  Result Value Ref Range   Glucose-Capillary 96 65 - 99 mg/dL   No results found.  Review of Systems  Constitutional: Negative.   HENT: Negative.   Eyes: Negative.    Respiratory: Negative.   Cardiovascular: Negative.   Gastrointestinal: Positive for constipation.  Genitourinary: Negative.   Musculoskeletal: Negative.   Skin: Negative.   Neurological: Negative.   Endo/Heme/Allergies: Negative.   Psychiatric/Behavioral: Negative.     Blood pressure (!) 109/54, pulse 72, temperature 98.3 F (36.8 C), temperature source Oral, resp. rate 15, height 5\' 3"  (1.6 m), weight 92.5 kg (204 lb), SpO2 99 %. Physical Exam  Constitutional: She appears well-developed and well-nourished.  HENT:  Head: Normocephalic and atraumatic.  Eyes: Pupils are equal, round, and reactive to light. EOM are normal.  Neck: Normal range of motion. Neck supple.  Respiratory:  AICD on left chest  GI: Soft. Bowel sounds are normal.  Skin: Skin is warm and dry.  Psychiatric: She has a normal mood and affect. Her behavior is normal. Judgment and thought content normal.    Assessment/Plan Colorectal cancer screening: proceed with a colonoscopy at this time.  Latora Quarry, MD 03/17/2017, 6:56 AM

## 2017-03-17 NOTE — Op Note (Addendum)
[facility] Patient Name: Cynthia Roth Procedure Date: 03/17/2017 MRN: [id number] Attending MD: Juanita Craver , MD Date of Birth: 02-Jan-1951 CSN: [id number] Age: 66 Admit Type: Outpatient Procedure:                Screening colonoscopy. Indications:              Personal history of colonic polyps-tubular                            adenoma-CRC screening for colorectal malignant                            neoplasm. Providers:                Juanita Craver, MD, Elmer Ramp. Hinson, RN, [employer], Technician, [employer], Immunologist. Referring MD:             Glendale Chard, MD Medicines:                Monitored Anesthesia Care. Complications:            No immediate complications. Estimated Blood Loss:     Estimated blood loss: none. Procedure:                Pre-anesthesia assessment: - Prior to the                            procedure, a History and Physical was performed,                            and patient medications and allergies were                            reviewed. The patient's tolerance of previous                            anesthesia was also reviewed. The risks and                            benefits of the procedure and the sedation options                            and risks were discussed with the patient. All                            questions were answered, and informed consent was                            obtained. Prior Anticoagulants: The patient has                            taken aspirin, last dose was 5 days prior to  procedure. ASA Grade assessment: III - A patient                            with severe systemic disease. After reviewing the                            risks and benefits, the patient was deemed in                            satisfactory condition to undergo the procedure.                            After obtaining informed consent, the colonoscope   was passed under direct vision. Throughout the                            procedure, the patient's blood pressure, pulse, and                            oxygen saturations were monitored continuously. The                            EC-[id number] ([id number]) scope was introduced through                            the anus and advanced to the the cecum, identified                            by appendiceal orifice and ileocecal valve. The                            colonoscopy was performed without difficulty. The                            patient tolerated the procedure well. The quality                            of the bowel preparation was adequate. The                            ileocecal valve, the appendiceal orifice and the                            rectum were photographed. The bowel preparation                            used was GoLYTELY. Scope In: 7:09:00 AM Scope Out: 7:21:14 AM Scope Withdrawal Time: 0 hours 6 minutes 6 seconds  Total Procedure Duration: 0 hours 12 minutes 14 seconds  Findings:      Except for a few scattered sigmoid diverticula, the entire examined       portion of the colon appeared normal.      No additional abnormalities were found on retroflexion. Impression:               -  Few scattered sigmoid diverticula; otherwise                            otherwise, the entire examined colon is normal.                           - No specimens collected. Moderate Sedation:      MAC used. Recommendation:           - High fiber diet and augmented water consumption                            daily.                           - Continue present medications.                           - Repeat colonoscopy in 5 years for surveillance.                           - Return to my office PRN.                           - If the patient has any abnormal GI symptoms in                            the interim, she has been advised to call the                            office ASAP for  further recommendations. Procedure Code(s):        --- Professional ---                           [phone], Colonoscopy, flexible; diagnostic, including                            collection of specimen(s) by brushing or washing,                            when performed (separate procedure) Diagnosis Code(s):        --- Professional ---                           Z86.010, Personal history of colonic polyps                           Z12.11, Encounter for screening for malignant                            neoplasm of colon                           K57.30, Diverticulosis of large intestine without                            perforation  or abscess without bleeding CPT copyright 2016 American Medical Association. All rights reserved. The codes documented in this report are preliminary and upon coder review may  be revised to meet current compliance requirements. Juanita Craver, MD Juanita Craver, MD 03/17/2017 7:36:19 AM This report has been signed electronically. Number of Addenda: 0

## 2017-03-17 NOTE — Anesthesia Postprocedure Evaluation (Signed)
Anesthesia Post Note  Patient: Cynthia Roth  Procedure(s) Performed: COLONOSCOPY WITH PROPOFOL (N/A )     Patient location during evaluation: PACU Anesthesia Type: MAC Level of consciousness: awake and alert Pain management: pain level controlled Vital Signs Assessment: post-procedure vital signs reviewed and stable Respiratory status: spontaneous breathing and respiratory function stable Cardiovascular status: stable Postop Assessment: no apparent nausea or vomiting Anesthetic complications: no    Last Vitals:  Vitals:   03/17/17 0729 03/17/17 0740  BP: (!) 87/44 (!) 98/46  Pulse: 75 73  Resp: 16 16  Temp:    SpO2: 100% 97%    Last Pain:  Vitals:   03/17/17 0635  TempSrc: Oral                 Eliseo Withers DANIEL

## 2017-03-17 NOTE — Discharge Instructions (Signed)

## 2017-03-17 NOTE — Transfer of Care (Signed)
Immediate Anesthesia Transfer of Care Note  Patient: Cynthia Roth  Procedure(s) Performed: Procedure(s): COLONOSCOPY WITH PROPOFOL (N/A)  Patient Location: PACU  Anesthesia Type:MAC  Level of Consciousness:  sedated, patient cooperative and responds to stimulation  Airway & Oxygen Therapy:Patient Spontanous Breathing and Patient connected to face mask oxgen  Post-op Assessment:  Report given to PACU RN and Post -op Vital signs reviewed and stable  Post vital signs:  Reviewed and stable  Last Vitals:  Vitals:   03/17/17 0635  BP: (!) 109/54  Pulse: 72  Resp: 15  Temp: 36.8 C  SpO2: 49%    Complications: No apparent anesthesia complications

## 2017-03-22 ENCOUNTER — Other Ambulatory Visit: Payer: Self-pay | Admitting: Internal Medicine

## 2017-03-22 DIAGNOSIS — Z1231 Encounter for screening mammogram for malignant neoplasm of breast: Secondary | ICD-10-CM

## 2017-03-22 NOTE — Addendum Note (Signed)
Encounter addended by: Lowell Guitar, RN on: 03/22/2017 12:03 PM<BR>    Actions taken: Episode resolved

## 2017-03-22 NOTE — Addendum Note (Signed)
Encounter addended by: Lowell Guitar, RN on: 03/22/2017 12:03 PM<BR>    Actions taken: Visit Navigator Flowsheet section accepted

## 2017-03-24 ENCOUNTER — Ambulatory Visit
Admission: RE | Admit: 2017-03-24 | Discharge: 2017-03-24 | Disposition: A | Discharge status: Home / Self Care | Payer: Medicare Other | Source: Ambulatory Visit | Attending: Internal Medicine | Admitting: Internal Medicine

## 2017-03-24 DIAGNOSIS — Z1231 Encounter for screening mammogram for malignant neoplasm of breast: Secondary | ICD-10-CM

## 2017-04-09 ENCOUNTER — Other Ambulatory Visit (HOSPITAL_COMMUNITY): Payer: Self-pay | Admitting: Student

## 2017-05-20 ENCOUNTER — Other Ambulatory Visit (HOSPITAL_COMMUNITY): Payer: Self-pay | Admitting: Cardiology

## 2017-06-02 ENCOUNTER — Ambulatory Visit (INDEPENDENT_AMBULATORY_CARE_PROVIDER_SITE_OTHER): Payer: Medicare Other | Admitting: *Deleted

## 2017-06-02 DIAGNOSIS — I428 Other cardiomyopathies: Secondary | ICD-10-CM

## 2017-06-02 DIAGNOSIS — I5022 Chronic systolic (congestive) heart failure: Secondary | ICD-10-CM

## 2017-06-03 NOTE — Progress Notes (Signed)
Remote ICD transmission.   

## 2017-06-06 ENCOUNTER — Encounter: Payer: Self-pay | Admitting: Cardiology

## 2017-06-14 LAB — CUP PACEART REMOTE DEVICE CHECK
Battery Remaining Longevity: 15 mo
Battery Voltage: 2.9 V
Brady Statistic AP VP Percent: 0.01 %
Brady Statistic AP VS Percent: 0.05 %
Brady Statistic AS VP Percent: 98.65 %
Brady Statistic AS VS Percent: 1.29 %
Brady Statistic RA Percent Paced: 0.06 %
Brady Statistic RV Percent Paced: 4.98 %
Date Time Interrogation Session: 20190103093826
HighPow Impedance: 82 Ohm
Implantable Lead Implant Date: 20131211
Implantable Lead Implant Date: 20131211
Implantable Lead Implant Date: 20131211
Implantable Lead Location: 753858
Implantable Lead Location: 753859
Implantable Lead Location: 753860
Implantable Lead Model: 181
Implantable Lead Model: 4396
Implantable Lead Model: 5076
Implantable Lead Serial Number: 32342
Implantable Pulse Generator Implant Date: 20131211
Lead Channel Impedance Value: 361 Ohm
Lead Channel Impedance Value: 532 Ohm
Lead Channel Impedance Value: 551 Ohm
Lead Channel Impedance Value: 646 Ohm
Lead Channel Impedance Value: 646 Ohm
Lead Channel Impedance Value: 931 Ohm
Lead Channel Pacing Threshold Amplitude: 0.5 V
Lead Channel Pacing Threshold Amplitude: 0.625 V
Lead Channel Pacing Threshold Amplitude: 3.25 V
Lead Channel Pacing Threshold Pulse Width: 0.4 ms
Lead Channel Pacing Threshold Pulse Width: 0.4 ms
Lead Channel Pacing Threshold Pulse Width: 0.8 ms
Lead Channel Sensing Intrinsic Amplitude: 10.5 mV
Lead Channel Sensing Intrinsic Amplitude: 10.5 mV
Lead Channel Sensing Intrinsic Amplitude: 3.875 mV
Lead Channel Sensing Intrinsic Amplitude: 3.875 mV
Lead Channel Setting Pacing Amplitude: 2 V
Lead Channel Setting Pacing Amplitude: 2.5 V
Lead Channel Setting Pacing Amplitude: 3.25 V
Lead Channel Setting Pacing Pulse Width: 0.4 ms
Lead Channel Setting Pacing Pulse Width: 0.8 ms
Lead Channel Setting Sensing Sensitivity: 0.45 mV

## 2017-07-31 ENCOUNTER — Other Ambulatory Visit: Payer: Self-pay | Admitting: Internal Medicine

## 2017-08-29 ENCOUNTER — Telehealth: Payer: Self-pay | Admitting: Internal Medicine

## 2017-08-29 NOTE — Telephone Encounter (Signed)
Pt's pharmacy is requesting a refill on Atorvastatin. Would Dr. Caryl Comes like to refill this medication? Please address.

## 2017-08-30 MED ORDER — ATORVASTATIN CALCIUM 20 MG PO TABS: 20.0000 mg | ORAL_TABLET | Freq: Every day | ORAL | 3 refills | Status: DC

## 2017-08-30 NOTE — Telephone Encounter (Signed)
Refill sent.

## 2017-09-01 ENCOUNTER — Ambulatory Visit (INDEPENDENT_AMBULATORY_CARE_PROVIDER_SITE_OTHER): Payer: Medicare Other | Admitting: *Deleted

## 2017-09-01 DIAGNOSIS — I428 Other cardiomyopathies: Secondary | ICD-10-CM | POA: Diagnosis not present

## 2017-09-01 DIAGNOSIS — I5022 Chronic systolic (congestive) heart failure: Secondary | ICD-10-CM

## 2017-09-01 NOTE — Progress Notes (Signed)
Remote ICD transmission.   

## 2017-09-05 ENCOUNTER — Other Ambulatory Visit: Payer: Self-pay | Admitting: Nurse Practitioner

## 2017-09-06 ENCOUNTER — Encounter: Payer: Self-pay | Admitting: Internal Medicine

## 2017-09-07 ENCOUNTER — Encounter: Payer: Self-pay | Admitting: Cardiology

## 2017-09-07 NOTE — Progress Notes (Signed)
Letter  

## 2017-09-12 ENCOUNTER — Other Ambulatory Visit (HOSPITAL_COMMUNITY): Payer: Self-pay | Admitting: Internal Medicine

## 2017-09-12 ENCOUNTER — Encounter: Payer: Self-pay | Admitting: Internal Medicine

## 2017-09-12 ENCOUNTER — Ambulatory Visit (INDEPENDENT_AMBULATORY_CARE_PROVIDER_SITE_OTHER): Payer: Medicare Other | Admitting: Internal Medicine

## 2017-09-12 VITALS — BP 120/68 | HR 67 | Ht 64.0 in | Wt 201.0 lb

## 2017-09-12 DIAGNOSIS — Z95810 Presence of automatic (implantable) cardiac defibrillator: Secondary | ICD-10-CM | POA: Diagnosis not present

## 2017-09-12 DIAGNOSIS — I428 Other cardiomyopathies: Secondary | ICD-10-CM

## 2017-09-12 DIAGNOSIS — I447 Left bundle-branch block, unspecified: Secondary | ICD-10-CM | POA: Diagnosis not present

## 2017-09-12 DIAGNOSIS — I5022 Chronic systolic (congestive) heart failure: Secondary | ICD-10-CM

## 2017-09-12 MED ORDER — ISOSORB DINITRATE-HYDRALAZINE 20-37.5 MG PO TABS: 1.0000 | ORAL_TABLET | Freq: Two times a day (BID) | ORAL | 11 refills | Status: DC

## 2017-09-12 NOTE — Patient Instructions (Addendum)
Medication Instructions:  Your physician has recommended you make the following change in your medication:   1. Increase your Bidil to one whole tablet, two times per day.   Labwork: Your physician recommends that you return for lab work tomorrow   Testing/Procedures: None ordered.  Follow-Up: Your physician recommends that you schedule a follow-up appointment in:   6 months with Dr Haroldine Laws 12 months with Dr Caryl Comes  Remote monitoring is used to monitor your ICD from home. This monitoring reduces the number of office visits required to check your device to one time per year. It allows [place] to keep an eye on the functioning of your device to ensure it is working properly. You are scheduled for a device check from home on 12/02/2017. You may send your transmission at any time that day. If you have a wireless device, the transmission will be sent automatically. After your physician reviews your transmission, you will receive a postcard with your next transmission date.    Any Other Special Instructions Will Be Listed Below (If Applicable).     If you need a refill on your cardiac medications before your next appointment, please call your pharmacy.

## 2017-09-12 NOTE — Progress Notes (Signed)
Patient Care Team: Glendale Chard, MD as PCP - General (Internal Medicine) Larey Dresser, MD as Consulting Physician (Cardiology) Juanita Craver, MD as Consulting Physician (Gastroenterology)   HPI  Cynthia Roth is a 67 y.o. female Seen in followup for congestive heart failure with CRT-D implantation for nonischemic cardiomyopathy 12/13   The patient denies chest pain, shortness of breath, nocturnal dyspnea, orthopnea or peripheral edema.  There have been no palpitations, lightheadedness or syncope.     DATE TEST EF   10/13 Echo   15 %   1/17 Echo   55-60 %   8/18 Echo  50-55%    Date Cr K Hgb  2/16   13.8  8/18 1.31 4.7 9.9           Past Medical History:  Diagnosis Date  . AICD (automatic cardioverter/defibrillator) present   . Allergy   . Biventricular ICD -Medtronic    DOI 12/13 - Medtronic Viva XR CRT-D defibrillator, serial #[id number] [id number] H.    . CHF (congestive heart failure) ([place])   . Chronic systolic heart failure ([place]) 12/02/2011  . Dyslipidemia 12/02/2011  . Gout    "very seldom" (05/10/2012)  . Hx of colonic polyps   . Hypertension    "used to have this; now my BP is low" (05/10/2012)  . LBBB (left bundle branch block) 12/02/2011  . Nonischemic cardiomyopathy ([place])   . Pre-diabetes   . Presence of permanent cardiac pacemaker   . Tubular adenoma 1999   history of    Past Surgical History:  Procedure Laterality Date  . BI-VENTRICULAR IMPLANTABLE CARDIOVERTER DEFIBRILLATOR N/A 05/10/2012   Procedure: BI-VENTRICULAR IMPLANTABLE CARDIOVERTER DEFIBRILLATOR  (CRT-D);  Surgeon: Deboraha Sprang, MD;  Location: [facility] CATH LAB;  Service: Cardiovascular;  Laterality: N/A;  . CARDIAC CATHETERIZATION  12-03-11   selectie coronary angiography  . CARDIAC DEFIBRILLATOR PLACEMENT  05/10/2012   CRT-D implantation (05/10/2012)  . COLONOSCOPY WITH PROPOFOL N/A 03/17/2017   Procedure: COLONOSCOPY WITH PROPOFOL;  Surgeon: Juanita Craver, MD;  Location: [facility] ENDOSCOPY;   Service: Endoscopy;  Laterality: N/A;  . LEFT HEART CATHETERIZATION WITH CORONARY ANGIOGRAM N/A 12/03/2011   Procedure: LEFT HEART CATHETERIZATION WITH CORONARY ANGIOGRAM;  Surgeon: Larey Dresser, MD;  Location: [facility] CATH LAB;  Service: Cardiovascular;  Laterality: N/A;  . PARTIAL HYSTERECTOMY  1980's    Current Outpatient Medications  Medication Sig Dispense Refill  . aspirin 81 MG tablet Take 81 mg by mouth daily.    [name] atorvastatin (LIPITOR) 20 MG tablet Take 1 tablet (20 mg total) by mouth daily. 90 tablet 3  . B COMPLEX VITAMINS SL Place 1 drop under the tongue daily.    [name] BIDIL 20-37.5 MG tablet TAKE (1/2) TABLET THREE TIMES DAILY. (Patient taking differently: TAKE (1/2) TABLET three times daily) 45 tablet 11  . carvedilol (COREG) 12.5 MG tablet TAKE 1 TABLET (12.5 MG TOTAL) BY MOUTH 2 (TWO) TIMES DAILY WITH A MEAL. 120 tablet 3  . Cholecalciferol (VITAMIN D3) 2000 units TABS Take 1 tablet by mouth daily.    . Cinnamon 500 MG capsule Take 1,000 mg by mouth daily.    . furosemide (LASIX) 20 MG tablet TAKE 1 TAB BY MOUTH EVERY OTHER DAY. MAY TAKE 1 EXTRA TAB DAILY AS NEEDED FOR SWELLING/WEIGHT GAIN 20 tablet 6  . lisinopril (PRINIVIL,ZESTRIL) 20 MG tablet TAKE 1 TABLET BY MOUTH TWICE A DAY 60 tablet 10  . magnesium oxide (MAG-OX) 400 (241.3 Mg) MG tablet Take 1 tablet (  400 mg total) by mouth daily. 90 tablet 3  . spironolactone (ALDACTONE) 25 MG tablet TAKE 1 TABLET BY MOUTH EVERY DAY 30 tablet 3   No current facility-administered medications for this visit.     Allergies  Allergen Reactions  . Decongestant [Pseudoephedrine Hcl Er]     PATIENT CAN'T REMEMBER     Review of Systems negative except from HPI and PMH  Physical Exam BP 120/68   Pulse 67   Ht 5\' 4"  (1.626 m)   Wt 201 lb (91.2 kg)   SpO2 98%   BMI 34.50 kg/m  Well developed and nourished in no acute distress HENT normal Neck supple with JVP-flat Clear Device pocket well healed; without hematoma or erythema.  There  is no tethering  Regular rate and rhythm, no murmurs or gallops Abd-soft with active BS No Clubbing cyanosis edema Skin-warm and dry A & Oriented  Grossly normal sensory and motor function   ECG demonstrates P-synchronous/ AV  pacing 15/13/38  Assessment and  Plan  Nonischemic cardiomyopathy  Anemia  HFpEF  Implantable defibrillator-Medtronic-CRT   . Overall [name] is doing very well.  Last assessment of LV function 8/18 demonstrated retained near normalization of LV function.  We will try to simplify her medication regime by having her take her BiDil twice daily.  Her anemia remains unexplained.  We will recheck her hemoglobin.  We will recheck renal function and K on her meds  Euvolemic continue current meds

## 2017-09-13 ENCOUNTER — Other Ambulatory Visit: Payer: Medicare Other | Admitting: *Deleted

## 2017-09-13 DIAGNOSIS — Z95810 Presence of automatic (implantable) cardiac defibrillator: Secondary | ICD-10-CM | POA: Diagnosis not present

## 2017-09-13 DIAGNOSIS — I428 Other cardiomyopathies: Secondary | ICD-10-CM | POA: Diagnosis not present

## 2017-09-13 DIAGNOSIS — I447 Left bundle-branch block, unspecified: Secondary | ICD-10-CM | POA: Diagnosis not present

## 2017-09-13 DIAGNOSIS — I5022 Chronic systolic (congestive) heart failure: Secondary | ICD-10-CM

## 2017-09-13 LAB — CUP PACEART INCLINIC DEVICE CHECK
Battery Remaining Longevity: 11 mo
Battery Voltage: 2.87 V
Brady Statistic AP VP Percent: 0.01 %
Brady Statistic AP VS Percent: 0.04 %
Brady Statistic AS VP Percent: 98.66 %
Brady Statistic AS VS Percent: 1.29 %
Brady Statistic RA Percent Paced: 0.05 %
Brady Statistic RV Percent Paced: 5.18 %
Date Time Interrogation Session: 20190415203737
HighPow Impedance: 76 Ohm
Implantable Lead Implant Date: 20131211
Implantable Lead Implant Date: 20131211
Implantable Lead Implant Date: 20131211
Implantable Lead Location: 753858
Implantable Lead Location: 753859
Implantable Lead Location: 753860
Implantable Lead Model: 181
Implantable Lead Model: 4396
Implantable Lead Model: 5076
Implantable Lead Serial Number: 32342
Implantable Pulse Generator Implant Date: 20131211
Lead Channel Impedance Value: 1007 Ohm
Lead Channel Impedance Value: 361 Ohm
Lead Channel Impedance Value: 551 Ohm
Lead Channel Impedance Value: 551 Ohm
Lead Channel Impedance Value: 665 Ohm
Lead Channel Impedance Value: 703 Ohm
Lead Channel Pacing Threshold Amplitude: 0.5 V
Lead Channel Pacing Threshold Amplitude: 0.625 V
Lead Channel Pacing Threshold Amplitude: 3 V
Lead Channel Pacing Threshold Pulse Width: 0.4 ms
Lead Channel Pacing Threshold Pulse Width: 0.4 ms
Lead Channel Pacing Threshold Pulse Width: 0.8 ms
Lead Channel Sensing Intrinsic Amplitude: 10.5 mV
Lead Channel Sensing Intrinsic Amplitude: 3.75 mV
Lead Channel Sensing Intrinsic Amplitude: 3.75 mV
Lead Channel Sensing Intrinsic Amplitude: 9.875 mV
Lead Channel Setting Pacing Amplitude: 2 V
Lead Channel Setting Pacing Amplitude: 2.5 V
Lead Channel Setting Pacing Amplitude: 2.5 V
Lead Channel Setting Pacing Pulse Width: 0.4 ms
Lead Channel Setting Pacing Pulse Width: 1 ms
Lead Channel Setting Sensing Sensitivity: 0.45 mV

## 2017-09-14 LAB — CBC WITH DIFFERENTIAL/PLATELET
Basophils Absolute: 0 10*3/uL (ref 0.0–0.2)
Basos: 1 %
EOS (ABSOLUTE): 0.2 10*3/uL (ref 0.0–0.4)
Eos: 5 %
Hematocrit: 28.2 % — ABNORMAL LOW (ref 34.0–46.6)
Hemoglobin: 9.4 g/dL — ABNORMAL LOW (ref 11.1–15.9)
Immature Grans (Abs): 0 10*3/uL (ref 0.0–0.1)
Immature Granulocytes: 0 %
Lymphocytes Absolute: 1.5 10*3/uL (ref 0.7–3.1)
Lymphs: 31 %
MCH: 31.2 pg (ref 26.6–33.0)
MCHC: 33.3 g/dL (ref 31.5–35.7)
MCV: 94 fL (ref 79–97)
Monocytes Absolute: 0.4 10*3/uL (ref 0.1–0.9)
Monocytes: 8 %
Neutrophils Absolute: 2.5 10*3/uL (ref 1.4–7.0)
Neutrophils: 55 %
Platelets: 296 10*3/uL (ref 150–379)
RBC: 3.01 x10E6/uL — ABNORMAL LOW (ref 3.77–5.28)
RDW: 13 % (ref 12.3–15.4)
WBC: 4.6 10*3/uL (ref 3.4–10.8)

## 2017-09-14 LAB — BASIC METABOLIC PANEL
BUN/Creatinine Ratio: 16 (ref 12–28)
BUN: 22 mg/dL (ref 8–27)
CO2: 23 mmol/L (ref 20–29)
Calcium: 9.6 mg/dL (ref 8.7–10.3)
Chloride: 106 mmol/L (ref 96–106)
Creatinine, Ser: 1.4 mg/dL — ABNORMAL HIGH (ref 0.57–1.00)
GFR calc Af Amer: 45 mL/min/{1.73_m2} — ABNORMAL LOW (ref >59)
GFR calc non Af Amer: 39 mL/min/{1.73_m2} — ABNORMAL LOW (ref >59)
Glucose: 106 mg/dL — ABNORMAL HIGH (ref 65–99)
Potassium: 4.9 mmol/L (ref 3.5–5.2)
Sodium: 141 mmol/L (ref 134–144)

## 2017-09-20 LAB — CUP PACEART REMOTE DEVICE CHECK
Battery Remaining Longevity: 11 mo
Battery Voltage: 2.87 V
Brady Statistic AP VP Percent: 0.01 %
Brady Statistic AP VS Percent: 0.04 %
Brady Statistic AS VP Percent: 98.67 %
Brady Statistic AS VS Percent: 1.28 %
Brady Statistic RA Percent Paced: 0.05 %
Brady Statistic RV Percent Paced: 5.67 %
Date Time Interrogation Session: 20190404073426
HighPow Impedance: 71 Ohm
Implantable Lead Implant Date: 20131211
Implantable Lead Implant Date: 20131211
Implantable Lead Implant Date: 20131211
Implantable Lead Location: 753858
Implantable Lead Location: 753859
Implantable Lead Location: 753860
Implantable Lead Model: 181
Implantable Lead Model: 4396
Implantable Lead Model: 5076
Implantable Lead Serial Number: 32342
Implantable Pulse Generator Implant Date: 20131211
Lead Channel Impedance Value: 361 Ohm
Lead Channel Impedance Value: 513 Ohm
Lead Channel Impedance Value: 532 Ohm
Lead Channel Impedance Value: 589 Ohm
Lead Channel Impedance Value: 608 Ohm
Lead Channel Impedance Value: 931 Ohm
Lead Channel Pacing Threshold Amplitude: 0.5 V
Lead Channel Pacing Threshold Amplitude: 0.625 V
Lead Channel Pacing Threshold Amplitude: 2.75 V
Lead Channel Pacing Threshold Pulse Width: 0.4 ms
Lead Channel Pacing Threshold Pulse Width: 0.4 ms
Lead Channel Pacing Threshold Pulse Width: 0.8 ms
Lead Channel Sensing Intrinsic Amplitude: 3.5 mV
Lead Channel Sensing Intrinsic Amplitude: 3.5 mV
Lead Channel Sensing Intrinsic Amplitude: 9.625 mV
Lead Channel Sensing Intrinsic Amplitude: 9.625 mV
Lead Channel Setting Pacing Amplitude: 2 V
Lead Channel Setting Pacing Amplitude: 2.5 V
Lead Channel Setting Pacing Amplitude: 3.25 V
Lead Channel Setting Pacing Pulse Width: 0.4 ms
Lead Channel Setting Pacing Pulse Width: 0.8 ms
Lead Channel Setting Sensing Sensitivity: 0.45 mV

## 2017-09-22 ENCOUNTER — Telehealth: Payer: Self-pay

## 2017-09-22 ENCOUNTER — Telehealth: Payer: Self-pay | Admitting: Internal Medicine

## 2017-09-22 MED ORDER — LISINOPRIL 20 MG PO TABS: 20.0000 mg | ORAL_TABLET | Freq: Every day | ORAL | 10 refills | Status: DC

## 2017-09-22 NOTE — Telephone Encounter (Signed)
Follow Up: ° ° ° °Returning your call from this morning. °

## 2017-09-22 NOTE — Telephone Encounter (Signed)
lmtcb for lab results and recommendations 

## 2017-09-22 NOTE — Telephone Encounter (Signed)
Patient is aware and agreeable to lab results and recommendations. She will touch base with PCP about worsening anemia and decrease Lisinopril to 1 tablet by mouth once daily. She is agreeable to recording weekly blood pressures for the next 4 weeks and letting our office know the values.

## 2017-09-22 NOTE — Addendum Note (Signed)
Addended by: Campbell Riches on: 09/22/2017 09:49 AM   Modules accepted: Orders

## 2017-09-22 NOTE — Telephone Encounter (Signed)
-----   Message from Deboraha Sprang, MD sent at 09/17/2017 10:11 AM EDT ----- Please Inform Patient that labs are concerning for ongoing but not worsening anemia  She needs to have this evaluated by her PCP Also her renal function is mildly worse; lets have her decrease lisinopril to daily and let us know what her BP are running ( taken weekly ) in about a mnth  Thanks

## 2017-09-28 ENCOUNTER — Other Ambulatory Visit: Payer: Self-pay | Admitting: Nurse Practitioner

## 2017-09-30 ENCOUNTER — Encounter: Payer: Self-pay | Admitting: Family Medicine

## 2017-09-30 ENCOUNTER — Ambulatory Visit (INDEPENDENT_AMBULATORY_CARE_PROVIDER_SITE_OTHER): Payer: Medicare Other | Admitting: Family Medicine

## 2017-09-30 VITALS — BP 110/70 | HR 85 | Temp 98.6°F | Ht 63.0 in | Wt 198.5 lb

## 2017-09-30 DIAGNOSIS — Z7689 Persons encountering health services in other specified circumstances: Secondary | ICD-10-CM | POA: Diagnosis not present

## 2017-09-30 DIAGNOSIS — D649 Anemia, unspecified: Secondary | ICD-10-CM

## 2017-09-30 DIAGNOSIS — I10 Essential (primary) hypertension: Secondary | ICD-10-CM | POA: Diagnosis not present

## 2017-09-30 DIAGNOSIS — Z131 Encounter for screening for diabetes mellitus: Secondary | ICD-10-CM

## 2017-09-30 DIAGNOSIS — I5022 Chronic systolic (congestive) heart failure: Secondary | ICD-10-CM | POA: Diagnosis not present

## 2017-09-30 LAB — BASIC METABOLIC PANEL
BUN: 17 mg/dL (ref 6–23)
CO2: 27 mEq/L (ref 19–32)
Calcium: 9.8 mg/dL (ref 8.4–10.5)
Chloride: 108 mEq/L (ref 96–112)
Creatinine, Ser: 1.26 mg/dL — ABNORMAL HIGH (ref 0.40–1.20)
GFR: 54.65 mL/min — ABNORMAL LOW (ref >60.00)
Glucose, Bld: 97 mg/dL (ref 70–99)
Potassium: 4.3 mEq/L (ref 3.5–5.1)
Sodium: 142 mEq/L (ref 135–145)

## 2017-09-30 LAB — CBC
HCT: 28.9 % — ABNORMAL LOW (ref 36.0–46.0)
Hemoglobin: 9.9 g/dL — ABNORMAL LOW (ref 12.0–15.0)
MCHC: 34.3 g/dL (ref 30.0–36.0)
MCV: 95.2 fl (ref 78.0–100.0)
Platelets: 287 10*3/uL (ref 150.0–400.0)
RBC: 3.03 Mil/uL — ABNORMAL LOW (ref 3.87–5.11)
RDW: 13.2 % (ref 11.5–15.5)
WBC: 4.7 10*3/uL (ref 4.0–10.5)

## 2017-09-30 LAB — HEMOGLOBIN A1C: Hgb A1c MFr Bld: 6.2 % (ref 4.6–6.5)

## 2017-09-30 MED ORDER — FERROUS SULFATE 325 (65 FE) MG PO TABS: 325.0000 mg | ORAL_TABLET | Freq: Every day | ORAL | 3 refills | Status: DC

## 2017-09-30 NOTE — Patient Instructions (Signed)
Anemia Anemia is a condition in which you do not have enough red blood cells or hemoglobin. Hemoglobin is a substance in red blood cells that carries oxygen. When you do not have enough red blood cells or hemoglobin (are anemic), your body cannot get enough oxygen and your organs may not work properly. As a result, you may feel very tired or have other problems. What are the causes? Common causes of anemia include:  Excessive bleeding. Anemia can be caused by excessive bleeding inside or outside the body, including bleeding from the intestine or from periods in women.  Poor nutrition.  Long-lasting (chronic) kidney, thyroid, and liver disease.  Bone marrow disorders.  Cancer and treatments for cancer.  HIV (human immunodeficiency virus) and AIDS (acquired immunodeficiency syndrome).  Treatments for HIV and AIDS.  Spleen problems.  Blood disorders.  Infections, medicines, and autoimmune disorders that destroy red blood cells.  What are the signs or symptoms? Symptoms of this condition include:  Minor weakness.  Dizziness.  Headache.  Feeling heartbeats that are irregular or faster than normal (palpitations).  Shortness of breath, especially with exercise.  Paleness.  Cold sensitivity.  Indigestion.  Nausea.  Difficulty sleeping.  Difficulty concentrating.  Symptoms may occur suddenly or develop slowly. If your anemia is mild, you may not have symptoms. How is this diagnosed? This condition is diagnosed based on:  Blood tests.  Your medical history.  A physical exam.  Bone marrow biopsy.  Your health care provider may also check your stool (feces) for blood and may do additional testing to look for the cause of your bleeding. You may also have other tests, including:  Imaging tests, such as a CT scan or MRI.  Endoscopy.  Colonoscopy.  How is this treated? Treatment for this condition depends on the cause. If you continue to lose a lot of blood,  you may need to be treated at a hospital. Treatment may include:  Taking supplements of iron, vitamin B12, or folic acid.  Taking a hormone medicine (erythropoietin) that can help to stimulate red blood cell growth.  Having a blood transfusion. This may be needed if you lose a lot of blood.  Making changes to your diet.  Having surgery to remove your spleen.  Follow these instructions at home:  Take over-the-counter and prescription medicines only as told by your health care provider.  Take supplements only as told by your health care provider.  Follow any diet instructions that you were given.  Keep all follow-up visits as told by your health care provider. This is important. Contact a health care provider if:  You develop new bleeding anywhere in the body. Get help right away if:  You are very weak.  You are short of breath.  You have pain in your abdomen or chest.  You are dizzy or feel faint.  You have trouble concentrating.  You have bloody or black, tarry stools.  You vomit repeatedly or you vomit up blood. Summary  Anemia is a condition in which you do not have enough red blood cells or enough of a substance in your red blood cells that carries oxygen (hemoglobin).  Symptoms may occur suddenly or develop slowly.  If your anemia is mild, you may not have symptoms.  This condition is diagnosed with blood tests as well as a medical history and physical exam. Other tests may be needed.  Treatment for this condition depends on the cause of the anemia. This information is not intended to replace advice   given to you by your health care provider. Make sure you discuss any questions you have with your health care provider. Document Released: 06/24/2004 Document Revised: 06/18/2016 Document Reviewed: 06/18/2016 Elsevier Interactive Patient Education  2018 [place] With Heart Failure  Heart failure is a long-term (chronic) condition in which the heart  cannot pump enough blood through the body. When this happens, parts of the body do not get the blood and oxygen they need. There is no cure for heart failure at this time, so it is important for you to take good care of yourself and follow the treatment plan set by your health care provider. If you are living with heart failure, there are ways to help you manage the disease. Follow these instructions at home: Living with heart failure requires you to make changes in your life. Your health care team will teach you about the changes you need to make in order to relieve your symptoms and lower your risk of going to the hospital. Follow the treatment plan as set by your health care provider. Medicines Medicines are important in reducing your heart's workload, slowing the progression of heart failure, and improving your symptoms.  Take over-the-counter and prescription medicines only as told by your health care provider.  Do not stop taking your medicine unless your health care provider tells you to do that.  Do not skip any dose of your medicine.  Refill prescriptions before you run out of medicine. You need your medicines every day.  Eating and drinking  Eat heart-healthy foods. Talk with a dietitian to make an eating plan that is right for you. ? If directed by your health care provider: ? Limit salt (sodium). Lowering your sodium intake may reduce symptoms of heart failure. Ask a dietitian to recommend heart-healthy seasonings. ? Limit your fluid intake. Fluid restriction may reduce symptoms of heart failure. ? Use low-fat cooking methods instead of frying. Low-fat methods include roasting, grilling, broiling, baking, poaching, steaming, and stir-frying. ? Choose foods that contain no trans fat and are low in saturated fat and cholesterol. Healthy choices include fresh or frozen fruits and vegetables, fish, lean meats, legumes, fat-free or low-fat dairy products, and whole-grain or high-fiber  foods.  Limit alcohol intake to no more than 1 drink a day for nonpregnant women and 2 drinks a day for men. One drink equals 12 oz of beer, 5 oz of wine, or 1 oz of hard liquor. ? Drinking more than that is harmful to your heart. Tell your health care provider if you drink alcohol several times a week. ? Talk with your health care provider about whether any level of alcohol use is safe for you. Activity  Ask your health care provider about attending cardiac rehabilitation. These programs include aerobic physical activity, which provides many benefits for your heart.  If no cardiac rehabilitation program is available, ask your health care provider what aerobic exercises are safe for you to do. Lifestyle Make the lifestyle changes recommended by your health care provider. In general:  Lose weight if your health care provider tells you to do that. Weight loss may reduce symptoms of heart failure.  Do not use any products that contain nicotine or tobacco, such as cigarettes or e-cigarettes. If you need help quitting, ask your health care provider.  Do not use street (illegal) drugs.  Return to your normal activities as told by your health care provider. Ask your health care provider what activities are safe for you.  General instructions  Make sure you weigh yourself every day to track your weight. Rapid weight gain may indicate an increase in fluid in your body and may increase the workload of your heart. ? Weigh yourself every morning. Do this after you urinate but before you eat breakfast. ? Wear the same type of clothing, without shoes, each time you weigh yourself. ? Weigh yourself on the same scale and in the same spot each time.  Living with chronic heart failure often leads to emotions such as fear, stress, anxiety, and depression. If you feel any of these emotions and need help coping, contact your health care provider. Other ways to get help include: ? Talking to friends and  family members about your condition. They can give you support and guidance. Explain your symptoms to them and, if comfortable, invite them to attend appointments or rehabilitation with you. ? Joining a support group for people with chronic heart failure. Talking with other people who have the same symptoms may give you new ways of coping with your disease and your emotions.  Stay up to date with your shots (vaccines). Staying current on pneumococcal and influenza vaccines is especially important in preventing germs from attacking your airways (respiratory infections).  Keep all follow-up visits as told by your health care provider. This is important. How to recognize changes in your condition You and your family members need to know what changes to watch for in your condition. Watch for the following changes and report them to your health care provider:  Sudden weight gain. Ask your health care provider what amount of weight gain to report.  Shortness of breath: ? Feeling short of breath while at rest, with no exercise or activity that required great effort. ? Feeling breathless with activity.  Swelling of your lower legs or ankles.  Difficulty sleeping: ? You wake up feeling short of breath. ? You have to use more pillows to raise your head in order to sleep.  Frequent, dry, hacking cough.  Loss of appetite.  Feeling more tired all the time.  Depression or feelings of sadness or hopelessness.  Bloating in the stomach.  Where to find more information  Local support groups. Ask your health care provider about groups near you.  The American Heart Association: www.heart.org Contact a health care provider if:  You have a rapid weight gain.  You have increasing shortness of breath that is unusual for you.  You are unable to participate in your usual physical activities.  You tire easily.  You cough more than normal, especially with physical activity.  You have any  swelling or more swelling in areas such as your hands, feet, ankles, or abdomen.  You feel like your heart is beating quickly (palpitations).  You become dizzy or light-headed when you stand up. Get help right away if:  You have difficulty breathing.  You notice or your family notices a change in your awareness, such as having trouble staying awake or having difficulty with concentration.  You have pain or discomfort in your chest.  You have an episode of fainting (syncope). Summary  There is no cure for heart failure, so it is important for you to take good care of yourself and follow the treatment plan set by your health care provider.  Medicines are important in reducing your heart's workload, slowing the progression of heart failure, and improving your symptoms.  Living with chronic heart failure often leads to emotions such as fear, stress, anxiety, and depression.  If you are feeling any of these emotions and need help coping, contact your health care provider. This information is not intended to replace advice given to you by your health care provider. Make sure you discuss any questions you have with your health care provider. Document Released: 09/29/2016 Document Revised: 09/29/2016 Document Reviewed: 09/29/2016 Elsevier Interactive Patient Education  2018 [employer].  How to Take Your Blood Pressure You can take your blood pressure at home with a machine. You may need to check your blood pressure at home:  To check if you have high blood pressure (hypertension).  To check your blood pressure over time.  To make sure your blood pressure medicine is working.  Supplies needed: You will need a blood pressure machine, or monitor. You can buy one at a drugstore or online. When choosing one:  Choose one with an arm cuff.  Choose one that wraps around your upper arm. Only one finger should fit between your arm and the cuff.  Do not choose one that measures your blood  pressure from your wrist or finger.  Your doctor can suggest a monitor. How to prepare Avoid these things for 30 minutes before checking your blood pressure:  Drinking caffeine.  Drinking alcohol.  Eating.  Smoking.  Exercising.  Five minutes before checking your blood pressure:  Pee.  Sit in a dining chair. Avoid sitting in a soft couch or armchair.  Be quiet. Do not talk.  How to take your blood pressure Follow the instructions that came with your machine. If you have a digital blood pressure monitor, these may be the instructions: 1. Sit up straight. 2. Place your feet on the floor. Do not cross your ankles or legs. 3. Rest your left arm at the level of your heart. You may rest it on a table, desk, or chair. 4. Pull up your shirt sleeve. 5. Wrap the blood pressure cuff around the upper part of your left arm. The cuff should be 1 inch (2.5 cm) above your elbow. It is best to wrap the cuff around bare skin. 6. Fit the cuff snugly around your arm. You should be able to place only one finger between the cuff and your arm. 7. Put the cord inside the groove of your elbow. 8. Press the power button. 9. Sit quietly while the cuff fills with air and loses air. 10. Write down the numbers on the screen. 11. Wait 2-3 minutes and then repeat steps 1-10.  What do the numbers mean? Two numbers make up your blood pressure. The first number is called systolic pressure. The second is called diastolic pressure. An example of a blood pressure reading is "120 over 80" (or 120/80). If you are an adult and do not have a medical condition, use this guide to find out if your blood pressure is normal: Normal  First number: below 120.  Second number: below 80. Elevated  First number: 120-129.  Second number: below 80. Hypertension stage 1  First number: 130-139.  Second number: 80-89. Hypertension stage 2  First number: 140 or above.  Second number: 45 or above. Your blood  pressure is above normal even if only the top or bottom number is above normal. Follow these instructions at home:  Check your blood pressure as often as your doctor tells you to.  Take your monitor to your next doctor's appointment. Your doctor will: ? Make sure you are using it correctly. ? Make sure it is working right.  Make sure  you understand what your blood pressure numbers should be.  Tell your doctor if your medicines are causing side effects. Contact a doctor if:  Your blood pressure keeps being high. Get help right away if:  Your first blood pressure number is higher than 180.  Your second blood pressure number is higher than 120. This information is not intended to replace advice given to you by your health care provider. Make sure you discuss any questions you have with your health care provider. Document Released: 04/29/2008 Document Revised: 04/14/2016 Document Reviewed: 10/24/2015 Elsevier Interactive Patient Education  [employer].

## 2017-09-30 NOTE — Progress Notes (Signed)
Patient presents to clinic today to establish care.  SUBJECTIVE: PMH: Patient is a 67 year old female with past medical history significant for prediabetes, GERD, CHF, HTN, HLD.  Pt was previously seen at [facility].  Prediabetes: -Patient states she has never been on medication for this -Patient has been trying to watch what she eats.  History of GERD: -Patient states in the past this is a problem but is since improved. -She has not had symptoms in years. -Patient is followed by gastroenterology, Dr. Juanita Craver  CHF, systolic: -Patient is followed by Dr. Virl Axe, cardiology -Patient states she was diagnosed in December 2014 as she has shortness of breath and other symptoms at that time. -Patient also has a pacemaker that was placed in December 2014 -Patient is taking carvedilol 12.5 mg twice daily, spironolactone 25 mg daily, furosemide 20 mg twice daily, lisinopril 20 mg, BiDil  HTN: -Patient has a BP monitor at home but does not check her blood pressure. -Patient tries to limit her sodium intake -Patient is on Spironolactone, carvedilol, furosemide, lisinopril, BiDil.  History of HLD: -Patient has taken atorvastatin 20 mg daily -Patient denies myalgia  Anemia: -Recent diagnosis. -Patient states she had labs done at cardiologist office and was told she was anemic. -Hemoglobin was 9.4 on 09/13/2017. -Patient was advised to find a PCP to address this. -Patient does endorse feeling tired/sluggish recently -Patient denies bleeding  Allergies: NKDA  Past surgical history: Pacemaker placement 2014 Partial hysterectomy  Social history: Patient is divorced.  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  Patient has a son.  Patient denies tobacco, alcohol, drug use.  Family medical history: Mom-deceased, hearing loss, heart disease Dad-deceased, alcohol abuse, diabetes, kidney  disease Brother-James, alcohol abuse, prostate cancer Brother-Jackie, alcohol abuse Brother-Anthony, diabetes MGM-deceased at age 46.  Breast cancer status post double mastectomy, colon cancer, heart disease, stroke MGF-deceased, alcohol abuse  Health Maintenance: Immunizations --patient has a history of chickenpox as a child but denies having shingles vaccine.  Colonoscopy --2018 Mammogram --2018 PAP --unsure Bone Density --unsure   Past Medical History:  Diagnosis Date  . AICD (automatic cardioverter/defibrillator) present   . Allergy   . Biventricular ICD -Medtronic    DOI 12/13 - Medtronic Viva XR CRT-D defibrillator, serial #[id number].    . CHF (congestive heart failure) ([place])   . Chronic systolic heart failure ([place]) 12/02/2011  . Colon polyps    Dr Mar Daring [facility]  . Dyslipidemia 12/02/2011  . Gout    "very seldom" (05/10/2012)  . Hx of colonic polyps   . Hyperlipidemia   . Hypertension    "used to have this; now my BP is low" (05/10/2012)  . LBBB (left bundle branch block) 12/02/2011  . Nonischemic cardiomyopathy ([place])   . Pre-diabetes   . Prediabetes   . Presence of permanent cardiac pacemaker   . Tubular adenoma 1999   history of    Past Surgical History:  Procedure Laterality Date  . BI-VENTRICULAR IMPLANTABLE CARDIOVERTER DEFIBRILLATOR N/A 05/10/2012   Procedure: BI-VENTRICULAR IMPLANTABLE CARDIOVERTER DEFIBRILLATOR  (CRT-D);  Surgeon: Deboraha Sprang, MD;  Location: [facility] CATH LAB;  Service: Cardiovascular;  Laterality: N/A;  . CARDIAC CATHETERIZATION  12-03-11   selectie coronary angiography  . CARDIAC DEFIBRILLATOR PLACEMENT  05/10/2012   CRT-D implantation (05/10/2012)  . COLONOSCOPY WITH PROPOFOL N/A 03/17/2017   Procedure: COLONOSCOPY WITH PROPOFOL;  Surgeon: Juanita Craver, MD;  Location: [facility];  Service: Endoscopy;  Laterality: N/A;  . LEFT HEART CATHETERIZATION WITH CORONARY ANGIOGRAM N/A 12/03/2011   Procedure: LEFT HEART CATHETERIZATION WITH CORONARY  ANGIOGRAM;  Surgeon: Larey Dresser, MD;  Location: [facility] CATH LAB;  Service: Cardiovascular;  Laterality: N/A;  . PARTIAL HYSTERECTOMY  1980's    Current Outpatient Medications on File Prior to Visit  Medication Sig Dispense Refill  . aspirin 81 MG tablet Take 81 mg by mouth daily.    [name] atorvastatin (LIPITOR) 20 MG tablet Take 1 tablet (20 mg total) by mouth daily. 90 tablet 3  . B COMPLEX VITAMINS SL Place 1 drop under the tongue daily.    . carvedilol (COREG) 12.5 MG tablet TAKE 1 TABLET (12.5 MG TOTAL) BY MOUTH 2 (TWO) TIMES DAILY WITH A MEAL. 120 tablet 3  . Cholecalciferol (VITAMIN D3) 2000 units TABS Take 1 tablet by mouth daily.    . Cinnamon 500 MG capsule Take 1,000 mg by mouth daily.    . Cyanocobalamin (VITAMIN B-12 PO) Take 5,000 mcg by mouth daily.    . furosemide (LASIX) 20 MG tablet TAKE 1 TAB BY MOUTH EVERY OTHER DAY. MAY TAKE 1 EXTRA TAB DAILY AS NEEDED FOR SWELLING/WEIGHT GAIN 20 tablet 6  . isosorbide-hydrALAZINE (BIDIL) 20-37.5 MG tablet Take 1 tablet by mouth 2 (two) times daily. 45 tablet 11  . lisinopril (PRINIVIL,ZESTRIL) 20 MG tablet Take 1 tablet (20 mg total) by mouth daily. 60 tablet 10  . magnesium oxide (MAG-OX) 400 MG tablet TAKE 1 TABLET BY MOUTH EVERY DAY 90 tablet 3  . spironolactone (ALDACTONE) 25 MG tablet TAKE 1 TABLET BY MOUTH EVERY DAY 30 tablet 3   No current facility-administered medications on file prior to visit.     Allergies  Allergen Reactions  . Decongestant [Pseudoephedrine Hcl Er]     PATIENT CAN'T REMEMBER     Family History  Problem Relation Age of Onset  . Angina Mother        diagnosed at age of 33's, unsure whether it was a true diagnosis. unsure about the treatment  . Memory loss Mother   . Sleep apnea Brother   . Kidney disease Father   . Diabetes Brother   . Heart failure Maternal Grandmother        diagnosed in age of 68's. still living at age of 43's.  [name] Heart attack Maternal Grandmother   . Stroke Maternal Grandmother     . Breast cancer Maternal Grandmother   . Colon cancer Maternal Grandmother   . Colon cancer Unknown        family history  . Intellectual disability Unknown   . Hypertension Neg Hx     Social History   Socioeconomic History  . Marital status: Divorced    Spouse name: n/a  . Number of children: 1  . Years of education: Master's  . Highest education level: Not on file  Occupational History  . Occupation: Conservator, museum/gallery  Social Needs  . Financial resource strain: Not on file  . Food insecurity:    Worry: Not on file    Inability: Not on file  . Transportation needs:    Medical: Not on file    Non-medical: Not on file  Tobacco Use  . Smoking status: Never Smoker  . Smokeless tobacco: Never Used  Substance and Sexual Activity  . Alcohol use: No    Alcohol/week: 0.0 oz  . Drug use: No  . Sexual activity:  Never  Lifestyle  . Physical activity:    Days per week: Not on file    Minutes per session: Not on file  . Stress: Not on file  Relationships  . Social connections:    Talks on phone: Not on file    Gets together: Not on file    Attends religious service: Not on file    Active member of club or organization: Not on file    Attends meetings of clubs or organizations: Not on file    Relationship status: Not on file  . Intimate partner violence:    Fear of current or ex partner: Not on file    Emotionally abused: Not on file    Physically abused: Not on file    Forced sexual activity: Not on file  Other Topics Concern  . Not on file  Social History Narrative   Lives in [place], and her mother, grandmother and one brother live with her. Divorced with one adult son who lives in [place], [place]. One brother lives in [place], [place]. Another lives in [place]. 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    ROS General: Denies fever, chills, night sweats, changes in weight, changes in appetite   +tired/sluggish HEENT: Denies headaches, ear pain, changes in vision, rhinorrhea, sore throat CV: Denies CP, palpitations, SOB, orthopnea Pulm: Denies SOB, cough, wheezing GI: Denies abdominal pain, nausea, vomiting, diarrhea, constipation GU: Denies dysuria, hematuria, frequency, vaginal discharge Msk: Denies muscle cramps, joint pains Neuro: Denies weakness, numbness, tingling Skin: Denies rashes, bruising Psych: Denies depression, anxiety, hallucinations  BP 110/70 (BP Location: Left Arm, Patient Position: Sitting, Cuff Size: Normal)   Pulse 85   Temp 98.6 F (37 C) (Oral)   Ht 5\' 3"  (1.6 m)   Wt 198 lb 8 oz (90 kg)   BMI 35.16 kg/m   Physical Exam Gen. Pleasant, well developed, well-nourished, in NAD HEENT - [place]/AT, PERRL, no scleral icterus, no nasal drainage, pharynx without erythema or exudate.  TMs normal bilaterally.  No cervical lymphadenopathy. Neck: No JVD, no thyromegaly, no carotid bruits Lungs: no use of accessory muscles, CTAB, no wheezes, rales or rhonchi Cardiovascular: RRR, No r/g/m, no peripheral edema Abdomen: BS present, soft, nontender,nondistended Neuro:  A&Ox3, CN II-XII intact, normal gait Skin:  Warm, dry, intact, no lesions Psych: normal affect, mood appropriate  Recent Results (from the past 2160 hour(s))  CUP PACEART REMOTE DEVICE CHECK     Status: None   Collection Time: 09/01/17  7:34 AM  Result Value Ref Range   Date Time Interrogation Session [id number]    Pulse Generator Manufacturer MERM    Pulse Gen Model DTBA1D1 Viva XT CRT-D    Pulse Gen Serial Number [id number]    Clinic Name [facility]    Implantable Pulse Generator Type Cardiac Resynch Therapy Defibulator    Implantable Pulse Generator Implant Date 58832549    Implantable Lead Manufacturer GUIC    Implantable Lead Model 0181 Endotak Reliance SG    Implantable Lead Serial Number [id number]    Implantable Lead Implant Date 82641583    Implantable Lead Location [id number]     Implantable Lead Manufacturer [facility]    Implantable Lead Model 4396 Attain Ability Straight    Implantable Lead Serial Number [id number]    Implantable Lead Implant Date 09407680    Implantable Lead Location Detail 1 Lateral Wall    Implantable Lead Location [id number]    Implantable Lead Manufacturer MERM    Implantable Lead Model  5076 CapSureFix Novus    Implantable Lead Serial Number [id number]    Implantable Lead Implant Date 75916384    Implantable Lead Location [id number]    Lead Channel Setting Sensing Sensitivity 0.45 mV   Lead Channel Setting Pacing Amplitude 2 V   Lead Channel Setting Pacing Pulse Width 0.4 ms   Lead Channel Setting Pacing Amplitude 2.5 V   Lead Channel Setting Pacing Pulse Width 0.8 ms   Lead Channel Setting Pacing Amplitude 3.25 V   Lead Channel Setting Pacing Capture Mode Monitor Capture    Lead Channel Impedance Value 361 ohm   Lead Channel Sensing Intrinsic Amplitude 3.5 mV   Lead Channel Sensing Intrinsic Amplitude 3.5 mV   Lead Channel Pacing Threshold Amplitude 0.5 V   Lead Channel Pacing Threshold Pulse Width 0.4 ms   Lead Channel Impedance Value 589 ohm   Lead Channel Impedance Value 608 ohm   Lead Channel Sensing Intrinsic Amplitude 9.625 mV   Lead Channel Sensing Intrinsic Amplitude 9.625 mV   Lead Channel Pacing Threshold Amplitude 0.625 V   Lead Channel Pacing Threshold Pulse Width 0.4 ms   HighPow Impedance 71 ohm   Lead Channel Impedance Value 931 ohm   Lead Channel Impedance Value 532 ohm   Lead Channel Impedance Value 513 ohm   Lead Channel Pacing Threshold Amplitude 2.75 V   Lead Channel Pacing Threshold Pulse Width 0.8 ms   Battery Status OK    Battery Remaining Longevity 11 mo   Battery Voltage 2.87 V   Brady Statistic RA Percent Paced 0.05 %   Brady Statistic RV Percent Paced 5.67 %   Brady Statistic AP VP Percent 0.01 %   Brady Statistic AS VP Percent 98.67 %   Brady Statistic AP VS Percent 0.04 %   Brady Statistic AS VS Percent 1.28  %   Eval Rhythm AsVp   CUP PACEART INCLINIC DEVICE CHECK     Status: None   Collection Time: 09/12/17  8:37 PM  Result Value Ref Range   Date Time Interrogation Session [id number]    Pulse Generator Manufacturer MERM    Pulse Gen Model DTBA1D1 Viva XT CRT-D    Pulse Gen Serial Number [id number]    Clinic Name [facility]    Implantable Pulse Generator Type Cardiac Resynch Therapy Defibulator    Implantable Pulse Generator Implant Date [id number]    Implantable Lead Manufacturer GUIC    Implantable Lead Model 0181 Endotak Reliance SG    Implantable Lead Serial Number [id number]    Implantable Lead Implant Date 25638937    Implantable Lead Location [id number]    Implantable Lead Manufacturer [facility]    Implantable Lead Model 4396 Attain Ability Straight    Implantable Lead Serial Number [id number]    Implantable Lead Implant Date 34287681    Implantable Lead Location Detail 1 Lateral Wall    Implantable Lead Location [id number]    Implantable Lead Manufacturer [facility]    Implantable Lead Model 5076 CapSureFix Novus    Implantable Lead Serial Number [id number]    Implantable Lead Implant Date 15726203    Implantable Lead Location [id number]    Lead Channel Setting Sensing Sensitivity 0.45 mV   Lead Channel Setting Pacing Amplitude 2 V   Lead Channel Setting Pacing Pulse Width 0.4 ms   Lead Channel Setting Pacing Amplitude 2.5 V   Lead Channel Setting Pacing Pulse Width 1 ms   Lead Channel Setting Pacing Amplitude 2.5 V   Lead Channel Setting Pacing Capture Mode Monitor  Capture    Lead Channel Impedance Value 361 ohm   Lead Channel Sensing Intrinsic Amplitude 3.75 mV   Lead Channel Sensing Intrinsic Amplitude 3.75 mV   Lead Channel Pacing Threshold Amplitude 0.5 V   Lead Channel Pacing Threshold Pulse Width 0.4 ms   Lead Channel Impedance Value 703 ohm   Lead Channel Impedance Value 665 ohm   Lead Channel Sensing Intrinsic Amplitude 10.5 mV   Lead Channel Sensing Intrinsic Amplitude 9.875 mV    Lead Channel Pacing Threshold Amplitude 0.625 V   Lead Channel Pacing Threshold Pulse Width 0.4 ms   HighPow Impedance 76 ohm   Lead Channel Impedance Value 1,007 ohm   Lead Channel Impedance Value 551 ohm   Lead Channel Impedance Value 551 ohm   Lead Channel Pacing Threshold Amplitude 3 V   Lead Channel Pacing Threshold Pulse Width 0.8 ms   Battery Status OK    Battery Remaining Longevity 11 mo   Battery Voltage 2.87 V   Brady Statistic RA Percent Paced 0.05 %   Brady Statistic RV Percent Paced 5.18 %   Brady Statistic AP VP Percent 0.01 %   Brady Statistic AS VP Percent 98.66 %   Brady Statistic AP VS Percent 0.04 %   Brady Statistic AS VS Percent 1.29 %   Eval Rhythm AS,VS @ 60   Basic metabolic panel     Status: Abnormal   Collection Time: 09/13/17 10:39 AM  Result Value Ref Range   Glucose 106 (H) 65 - 99 mg/dL   BUN 22 8 - 27 mg/dL   Creatinine, Ser 1.40 (H) 0.57 - 1.00 mg/dL   GFR calc non Af Amer 39 (L) >59 mL/min/1.73   GFR calc Af Amer 45 (L) >59 mL/min/1.73   BUN/Creatinine Ratio 16 12 - 28   Sodium 141 134 - 144 mmol/L   Potassium 4.9 3.5 - 5.2 mmol/L   Chloride 106 96 - 106 mmol/L   CO2 23 20 - 29 mmol/L   Calcium 9.6 8.7 - 10.3 mg/dL  CBC with Differential/Platelet     Status: Abnormal   Collection Time: 09/13/17 10:39 AM  Result Value Ref Range   WBC 4.6 3.4 - 10.8 x10E3/uL   RBC 3.01 (L) 3.77 - 5.28 x10E6/uL   Hemoglobin 9.4 (L) 11.1 - 15.9 g/dL   Hematocrit 28.2 (L) 34.0 - 46.6 %   MCV 94 79 - 97 fL   MCH 31.2 26.6 - 33.0 pg   MCHC 33.3 31.5 - 35.7 g/dL   RDW 13.0 12.3 - 15.4 %   Platelets 296 150 - 379 x10E3/uL   Neutrophils 55 Not Estab. %   Lymphs 31 Not Estab. %   Monocytes 8 Not Estab. %   Eos 5 Not Estab. %   Basos 1 Not Estab. %   Neutrophils Absolute 2.5 1.4 - 7.0 x10E3/uL   Lymphocytes Absolute 1.5 0.7 - 3.1 x10E3/uL   Monocytes Absolute 0.4 0.1 - 0.9 x10E3/uL   EOS (ABSOLUTE) 0.2 0.0 - 0.4 x10E3/uL   Basophils Absolute 0.0 0.0 - 0.2  x10E3/uL   Immature Granulocytes 0 Not Estab. %   Immature Grans (Abs) 0.0 0.0 - 0.1 x10E3/uL    Assessment/Plan: Essential hypertension  -controlled -Patient encouraged to start checking her blood pressure at home and keeping a log. -Continue current medications including lisinopril 20 mg, Spironolactone 25 mg, carvedilol 12.5 mg twice daily - Plan: Basic metabolic panel  Chronic systolic congestive heart failure (HCC) -Continue carvedilol 12.5 mg spinal lactone  25 mg, furosemide 20 mg twice daily, lisinopril 20 mg daily, BiDil -Continue follow-up cardiology PRN -Patient advised to watch sodium intake and monitor changes in weight -Given recent adjustments of Lasix will recheck BMP - Plan: Basic metabolic panel  Anemia, unspecified type -Given handout - Plan: ferrous sulfate 325 (65 FE) MG tablet, CBC (no diff)  Encounter to establish care -We reviewed the PMH, PSH, FH, SH, Meds and Allergies. -We provided refills for any medications we will prescribe as needed. -We addressed current concerns per orders and patient instructions. -We have asked for records for pertinent exams, studies, vaccines and notes from previous providers. -We have advised patient to follow up per instructions below.   Follow-up in the next few months for CPE. Discussed shingles vaccine with patient she is considering this.  Grier Mitts, MD

## 2017-10-03 ENCOUNTER — Encounter: Payer: Self-pay | Admitting: *Deleted

## 2017-10-04 ENCOUNTER — Other Ambulatory Visit: Payer: Self-pay | Admitting: Internal Medicine

## 2017-12-07 ENCOUNTER — Ambulatory Visit (INDEPENDENT_AMBULATORY_CARE_PROVIDER_SITE_OTHER): Payer: Medicare Other | Admitting: *Deleted

## 2017-12-07 DIAGNOSIS — I428 Other cardiomyopathies: Secondary | ICD-10-CM | POA: Diagnosis not present

## 2017-12-07 DIAGNOSIS — I5022 Chronic systolic (congestive) heart failure: Secondary | ICD-10-CM

## 2017-12-07 NOTE — Progress Notes (Signed)
Remote ICD transmission.   

## 2018-01-11 LAB — CUP PACEART REMOTE DEVICE CHECK
Battery Remaining Longevity: 8 mo
Battery Voltage: 2.85 V
Brady Statistic AP VP Percent: 0.03 %
Brady Statistic AP VS Percent: 0.06 %
Brady Statistic AS VP Percent: 98.65 %
Brady Statistic AS VS Percent: 1.26 %
Brady Statistic RA Percent Paced: 0.09 %
Brady Statistic RV Percent Paced: 6.95 %
Date Time Interrogation Session: 20190710121009
HighPow Impedance: 72 Ohm
Implantable Lead Implant Date: 20131211
Implantable Lead Implant Date: 20131211
Implantable Lead Implant Date: 20131211
Implantable Lead Location: 753858
Implantable Lead Location: 753859
Implantable Lead Location: 753860
Implantable Lead Model: 181
Implantable Lead Model: 4396
Implantable Lead Model: 5076
Implantable Lead Serial Number: 32342
Implantable Pulse Generator Implant Date: 20131211
Lead Channel Impedance Value: 399 Ohm
Lead Channel Impedance Value: 475 Ohm
Lead Channel Impedance Value: 589 Ohm
Lead Channel Impedance Value: 608 Ohm
Lead Channel Impedance Value: 646 Ohm
Lead Channel Impedance Value: 950 Ohm
Lead Channel Pacing Threshold Amplitude: 0.625 V
Lead Channel Pacing Threshold Amplitude: 0.75 V
Lead Channel Pacing Threshold Amplitude: 3 V
Lead Channel Pacing Threshold Pulse Width: 0.4 ms
Lead Channel Pacing Threshold Pulse Width: 0.4 ms
Lead Channel Pacing Threshold Pulse Width: 0.8 ms
Lead Channel Sensing Intrinsic Amplitude: 10.25 mV
Lead Channel Sensing Intrinsic Amplitude: 10.25 mV
Lead Channel Sensing Intrinsic Amplitude: 3.625 mV
Lead Channel Sensing Intrinsic Amplitude: 3.625 mV
Lead Channel Setting Pacing Amplitude: 2 V
Lead Channel Setting Pacing Amplitude: 2.5 V
Lead Channel Setting Pacing Amplitude: 2.5 V
Lead Channel Setting Pacing Pulse Width: 0.4 ms
Lead Channel Setting Pacing Pulse Width: 1 ms
Lead Channel Setting Sensing Sensitivity: 0.45 mV

## 2018-01-21 ENCOUNTER — Other Ambulatory Visit (HOSPITAL_COMMUNITY): Payer: Self-pay | Admitting: Internal Medicine

## 2018-01-31 ENCOUNTER — Other Ambulatory Visit (HOSPITAL_COMMUNITY): Payer: Self-pay | Admitting: Cardiology

## 2018-01-31 MED ORDER — FUROSEMIDE 20 MG PO TABS: 20.0000 mg | ORAL_TABLET | ORAL | 0 refills | Status: DC

## 2018-02-06 ENCOUNTER — Encounter (HOSPITAL_COMMUNITY): Payer: Self-pay

## 2018-02-06 ENCOUNTER — Ambulatory Visit (HOSPITAL_COMMUNITY)
Admission: RE | Admit: 2018-02-06 | Discharge: 2018-02-06 | Disposition: A | Discharge status: Home / Self Care | Payer: Medicare Other | Source: Ambulatory Visit | Attending: Internal Medicine | Admitting: Internal Medicine

## 2018-02-06 VITALS — BP 98/66 | HR 80 | Wt 193.6 lb

## 2018-02-06 DIAGNOSIS — E785 Hyperlipidemia, unspecified: Secondary | ICD-10-CM | POA: Insufficient documentation

## 2018-02-06 DIAGNOSIS — Z8249 Family history of ischemic heart disease and other diseases of the circulatory system: Secondary | ICD-10-CM | POA: Diagnosis not present

## 2018-02-06 DIAGNOSIS — I10 Essential (primary) hypertension: Secondary | ICD-10-CM | POA: Diagnosis not present

## 2018-02-06 DIAGNOSIS — I447 Left bundle-branch block, unspecified: Secondary | ICD-10-CM

## 2018-02-06 DIAGNOSIS — Z7982 Long term (current) use of aspirin: Secondary | ICD-10-CM | POA: Insufficient documentation

## 2018-02-06 DIAGNOSIS — I428 Other cardiomyopathies: Secondary | ICD-10-CM | POA: Diagnosis not present

## 2018-02-06 DIAGNOSIS — Z95810 Presence of automatic (implantable) cardiac defibrillator: Secondary | ICD-10-CM | POA: Diagnosis not present

## 2018-02-06 DIAGNOSIS — I5022 Chronic systolic (congestive) heart failure: Secondary | ICD-10-CM | POA: Diagnosis not present

## 2018-02-06 DIAGNOSIS — I110 Hypertensive heart disease with heart failure: Secondary | ICD-10-CM | POA: Diagnosis not present

## 2018-02-06 DIAGNOSIS — Z79899 Other long term (current) drug therapy: Secondary | ICD-10-CM | POA: Diagnosis not present

## 2018-02-06 NOTE — Patient Instructions (Signed)
No changes to medication at this time.  No lab work today.  Congratulations!  You have graduated from our [place]! We will refer you to Dr. Skeet Latch at [facility] office in 6 months. Their office will contact you directly to schedule. Address: [address], [place], [place] [address] Phone: [phone]  Take all medication as prescribed the day of your appointment. Bring all medications with you to your appointment.  Do the following things EVERYDAY: 1) Weigh yourself in the morning before breakfast. Write it down and keep it in a log. 2) Take your medicines as prescribed 3) Eat low salt foods-Limit salt (sodium) to 2000 mg per day.  4) Stay as active as you can everyday 5) Limit all fluids for the day to less than 2 liters

## 2018-02-06 NOTE — Progress Notes (Signed)
Patient ID: Cynthia Roth, female   DOB: [birth year], 67 y.o.   MRN: [id number]  PCP: [facility]: Kandace Blitz Cynthia Roth is a 67 y.o. female with PMH of nonischemic cardiomyopathy.  Patient was admitted to [facility] in 7/13 with acute CHF.  No prior cardiac history.  No ETOH/Drug history.  No significant family history. No prior viral illness. TSH normal.    She had LBBB on EKG and her echo showed EF 15% with severely dilated LV and diastolic dysfunction. LHC showed no significant coronary disease.  Repeat echo in 10/13 on good medical therapy showed persistent EF 15%.  She had Medtronic CRT-D device placed in 12/13 by Dr. Caryl Comes.  Followup echo in 3/14 showed persistently depressed LV systolic function with EF 20%.  CPX in 07/2013 showed mildly decreased functional capacity, suspect obesity played significant role in this.  Repeat echo in 1/17 showed EF up to 55-60%.    ECHO 2018 EF 50-55%   Today she returns for yearly HF follow up. Overall feeling fine. Denies SOB/PND/Orthopnea. Appetite ok. No fever or chills. Weight at home 191-192  pounds. Taking all medications. shie walking 20 miles a week and taking zumba classes.    Allergies  Allergen Reactions  . Decongestant [Pseudoephedrine Hcl Er]     PATIENT CAN'T REMEMBER    Past Medical History: 1. CHF and Nonischemic cardiomyopathy: patient was admitted with acute CHF on 11/29/11 and Echo showed EF 15% with diffuse hypokinesis, severely dilated LV, severe diastolic dysfunction. Left heart cath showed no angiographic CAD.  TSH, SPEP, urine immunofixation were all within normal range.  Repeat echo 10/13 with EF 15%, diffuse hypokinesis.  Medtronic CRT-D device placed 12/13 Caryl Comes).  Echo (3/14) with EF 20%, diffuse hypokinesis, mild to moderate MR, normal RV. CPX (3/15) with peak VO2 13.4 adjusted to 21 for ideal body weight, VE/VCO2 slope 31, RER 1.09, mildly decreased functional capacity, suspect obesity plays a role.  - Echo  (1/17): EF improved to 55-60% sp CRT placement.  2. LBBB: noted on hospital admission on 11/29/11. No old EKG. 3. HTN 4. Dyslipidemia 5. Tubular adenoma:  Last colonoscopy in 1999. Followed by GI Dr. Collene Mares.  Family History  Problem Relation Age of Onset  . Angina Mother        diagnosed at age of 21's, unsure whether it was a true diagnosis. unsure about the treatment  . Memory loss Mother   . Sleep apnea Brother   . Kidney disease Father   . Diabetes Brother   . Heart failure Maternal Grandmother        diagnosed in age of 25's. still living at age of 85's.  [name] Heart attack Maternal Grandmother   . Stroke Maternal Grandmother   . Breast cancer Maternal Grandmother   . Colon cancer Maternal Grandmother   . Colon cancer Unknown        family history  . Intellectual disability Unknown   . Hypertension Neg Hx     Social History   Socioeconomic History  . Marital status: Divorced    Spouse name: n/a  . Number of children: 1  . Years of education: Master's  . Highest education level: Not on file  Occupational History  . Occupation: Conservator, museum/gallery  Social Needs  . Financial resource strain: Not on file  . Food insecurity:    Worry: Not on file    Inability: Not on file  . Transportation needs:  Medical: Not on file    Non-medical: Not on file  Tobacco Use  . Smoking status: Never Smoker  . Smokeless tobacco: Never Used  Substance and Sexual Activity  . Alcohol use: No    Alcohol/week: 0.0 standard drinks  . Drug use: No  . Sexual activity: Never  Lifestyle  . Physical activity:    Days per week: Not on file    Minutes per session: Not on file  . Stress: Not on file  Relationships  . Social connections:    Talks on phone: Not on file    Gets together: Not on file    Attends religious service: Not on file    Active member of club or organization: Not on file    Attends meetings of clubs or organizations: Not on  file    Relationship status: Not on file  . Intimate partner violence:    Fear of current or ex partner: Not on file    Emotionally abused: Not on file    Physically abused: Not on file    Forced sexual activity: Not on file  Other Topics Concern  . Not on file  Social History Narrative   Lives in [place], and her mother, grandmother and one brother live with her. Divorced with one adult son who lives in [place], [place]. One brother lives in [place], [place]. Another lives in [place]. 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    Current Outpatient Medications on File Prior to Encounter  Medication Sig Dispense Refill  . aspirin 81 MG tablet Take 81 mg by mouth daily.    [name] atorvastatin (LIPITOR) 20 MG tablet Take 1 tablet (20 mg total) by mouth daily. 90 tablet 3  . B COMPLEX VITAMINS SL Place 1 drop under the tongue daily.    [name] BIDIL 20-37.5 MG tablet TAKE (1/2) TABLET THREE TIMES DAILY. 45 tablet 12  . carvedilol (COREG) 12.5 MG tablet TAKE 1 TABLET (12.5 MG TOTAL) BY MOUTH 2 (TWO) TIMES DAILY WITH A MEAL. 120 tablet 3  . Cholecalciferol (VITAMIN D3) 2000 units TABS Take 1 tablet by mouth daily.    . Cinnamon 500 MG capsule Take 1,000 mg by mouth daily.    . Cyanocobalamin (VITAMIN B-12 PO) Take 5,000 mcg by mouth daily.    . ferrous sulfate 325 (65 FE) MG tablet Take 1 tablet (325 mg total) by mouth daily with breakfast. 90 tablet 3  . furosemide (LASIX) 20 MG tablet Take 1 tablet (20 mg total) by mouth every other day. 20 tablet 0  . lisinopril (PRINIVIL,ZESTRIL) 20 MG tablet Take 1 tablet (20 mg total) by mouth daily. 60 tablet 10  . magnesium oxide (MAG-OX) 400 MG tablet TAKE 1 TABLET BY MOUTH EVERY DAY 90 tablet 3  . spironolactone (ALDACTONE) 25 MG tablet TAKE 1 TABLET BY MOUTH EVERY DAY 30 tablet 2   No current facility-administered medications on file prior to encounter.    Vitals:   02/06/18 1458  BP: 98/66  Pulse: 80  SpO2: 99%  Weight: 87.8 kg (193 lb  9.6 oz)   Wt Readings from Last 3 Encounters:  02/06/18 87.8 kg (193 lb 9.6 oz)  09/30/17 90 kg (198 lb 8 oz)  09/12/17 91.2 kg (201 lb)   General:  Well appearing. No resp difficulty HEENT: normal Neck: supple. no JVD. Carotids 2+ bilat; no bruits. No lymphadenopathy or thryomegaly appreciated. Cor: PMI nondisplaced. Regular rate & rhythm. No rubs, gallops or murmurs. Lungs:  clear Abdomen: soft, nontender, nondistended. No hepatosplenomegaly. No bruits or masses. Good bowel sounds. Extremities: no cyanosis, clubbing, rash, edema Neuro: alert & orientedx3, cranial nerves grossly intact. moves all 4 extremities w/o difficulty. Affect pleasant  Assessment/Plan:  1. Chronic systolic heart failure: Nonischemic cardiomyopathy, s/p Medtronic CRT-D.  Most recent echo 2018 EF 50-55%     NYHA I. Volume status stable. Continue Lasix 20 mg every other day.  - Continue lisinopril 20 mg daily and spironolactone 25 mg daily.  - Continue Bidil 1/2 tab three times a day   - Continue coreg 12.5 mg BID.  2. LBBB: s/p CRT-D placement with improvement of EF 3. HTN: Stable.   Follow up with [facility] general cardiology.  Follow up with HF as needed.     Darrick Grinder, NP  02/06/2018

## 2018-02-24 ENCOUNTER — Other Ambulatory Visit: Payer: Self-pay | Admitting: Family Medicine

## 2018-02-24 DIAGNOSIS — Z1231 Encounter for screening mammogram for malignant neoplasm of breast: Secondary | ICD-10-CM

## 2018-03-02 ENCOUNTER — Ambulatory Visit (INDEPENDENT_AMBULATORY_CARE_PROVIDER_SITE_OTHER): Payer: Medicare Other | Admitting: Cardiovascular Disease

## 2018-03-02 ENCOUNTER — Encounter: Payer: Self-pay | Admitting: Cardiovascular Disease

## 2018-03-02 VITALS — BP 97/53 | HR 78 | Ht 64.5 in | Wt 190.6 lb

## 2018-03-02 DIAGNOSIS — I10 Essential (primary) hypertension: Secondary | ICD-10-CM

## 2018-03-02 DIAGNOSIS — Z6832 Body mass index (BMI) 32.0-32.9, adult: Secondary | ICD-10-CM

## 2018-03-02 DIAGNOSIS — E6609 Other obesity due to excess calories: Secondary | ICD-10-CM

## 2018-03-02 DIAGNOSIS — I5042 Chronic combined systolic (congestive) and diastolic (congestive) heart failure: Secondary | ICD-10-CM | POA: Diagnosis not present

## 2018-03-02 MED ORDER — LISINOPRIL 10 MG PO TABS: 10.0000 mg | ORAL_TABLET | Freq: Every day | ORAL | 1 refills | Status: DC

## 2018-03-02 NOTE — Patient Instructions (Signed)
Medication Instructions:  DECREASE YOUR LISINOPRIL TO 10 MG DAILY   Labwork: NONE  Testing/Procedures: NONE  Follow-Up: Your physician wants you to follow-up in: 6 MONTHS You will receive a reminder letter in the mail two months in advance. If you don't receive a letter, please call our office to schedule the follow-up appointment.  Any Other Special Instructions Will Be Listed Below (If Applicable).  MONITOR YOUR BLOOD PRESSURE AT HOME. IF IT DOES NOT REMAIN BELOW 130/80 CALL THE OFFICE [phone]  If you need a refill on your cardiac medications before your next appointment, please call your pharmacy.

## 2018-03-02 NOTE — Progress Notes (Signed)
Cardiology Office Note   Date:  03/04/2018   ID:  Cynthia Roth, Cynthia Roth May 02, 1951, MRN [id number]  PCP:  [name], MD  Cardiologist:   Skeet Latch, MD  Electrophysiologist: Dr. Caryl Comes  Chief Complaint  Patient presents with  . New Patient (Initial Visit)      History of Present Illness: Cynthia Roth is a 67 y.o. female with chronic systolic and diastolic heart failure resolved after CRT-D, CAD, hypertension and LBBB here to establish care.  She was initially a patient of Dr. [name]. She was admitted to [facility] 11/2011 with acute systolic and diastolic heart failure.  Her left ventricle was severely dilated and  LVEF was 15%.  She also had a LBBB.  LHC at that time showed mild, non-obstructive CAD.  Repeat echo 3 months later showed persistently depressed LV functoin.  She had a CRT-D implanted 04/2012.  Follow up echo 3/204 showed LVEF 20% with mild-moderate MR and normal RV function.  Repeat echo 06/2015 showed an improvement to 55-60% and it was 50-55% 82018.  She last saw Dr. [name] 12/2016 and has been discharged from HF clinic given her clinical improvement.    Cynthia Roth has been feeling well.  She does Zumba 3 days per week.  She has no chest pain or shortness of breath with exertion.  She reports feeling drowsy all the time.  She sleeps approximately 5 hours per night.  She wakes up  2-3 times per night to urinate.  She is unsure whether she snores.  She doesn't feel rested in the mornings and she falls asleep easily during the day.  She sometimes has palpitations when lying down but not throughout the day.  She does get lightheaded 4-5 times per week.  This typically occurs with positional changes.  She denies syncope.  Lisinopril was reduced from 40mg  to 20mg  due to hypotension.  She has very mild edema but no orthopnea or PND.    Past Medical History:  Diagnosis Date  . AICD (automatic cardioverter/defibrillator) present   . Allergy   .  Biventricular ICD -Medtronic    DOI 12/13 - Medtronic Viva XR CRT-D defibrillator, serial #BLF [id number].    . CHF (congestive heart failure) ([place])   . Chronic systolic heart failure ([place]) 12/02/2011  . Colon polyps    Dr Mar Daring [facility]  . Dyslipidemia 12/02/2011  . Gout    "very seldom" (05/10/2012)  . Hx of colonic polyps   . Hyperlipidemia   . Hypertension    "used to have this; now my BP is low" (05/10/2012)  . LBBB (left bundle branch block) 12/02/2011  . Nonischemic cardiomyopathy ([place])   . Pre-diabetes   . Prediabetes   . Presence of permanent cardiac pacemaker   . Tubular adenoma 1999   history of    Past Surgical History:  Procedure Laterality Date  . BI-VENTRICULAR IMPLANTABLE CARDIOVERTER DEFIBRILLATOR N/A 05/10/2012   Procedure: BI-VENTRICULAR IMPLANTABLE CARDIOVERTER DEFIBRILLATOR  (CRT-D);  Surgeon: Deboraha Sprang, MD;  Location: [facility] CATH LAB;  Service: Cardiovascular;  Laterality: N/A;  . CARDIAC CATHETERIZATION  12-03-11   selectie coronary angiography  . CARDIAC DEFIBRILLATOR PLACEMENT  05/10/2012   CRT-D implantation (05/10/2012)  . COLONOSCOPY WITH PROPOFOL N/A 03/17/2017   Procedure: COLONOSCOPY WITH PROPOFOL;  Surgeon: Juanita Craver, MD;  Location: [facility] ENDOSCOPY;  Service: Endoscopy;  Laterality: N/A;  . LEFT HEART CATHETERIZATION WITH CORONARY ANGIOGRAM N/A 12/03/2011   Procedure: LEFT HEART CATHETERIZATION WITH CORONARY ANGIOGRAM;  Surgeon:  Larey Dresser, MD;  Location[facility] CATH LAB;  Service: Cardiovascular;  Laterality: N/A;  . PARTIAL HYSTERECTOMY  1980's     Current Outpatient Medications  Medication Sig Dispense Refill  . aspirin 81 MG tablet Take 81 mg by mouth daily.    [name] atorvastatin (LIPITOR) 20 MG tablet Take 1 tablet (20 mg total) by mouth daily. 90 tablet 3  . B COMPLEX VITAMINS SL Place 1 drop under the tongue daily.    [name] BIDIL 20-37.5 MG tablet TAKE (1/2) TABLET THREE TIMES DAILY. 45 tablet 12  . carvedilol (COREG) 12.5 MG tablet TAKE 1 TABLET (12.5  MG TOTAL) BY MOUTH 2 (TWO) TIMES DAILY WITH A MEAL. 120 tablet 3  . Cholecalciferol (VITAMIN D3) 2000 units TABS Take 1 tablet by mouth daily.    . Cinnamon 500 MG capsule Take 1,000 mg by mouth daily.    . Cyanocobalamin (VITAMIN B-12 PO) Take 5,000 mcg by mouth daily.    . ferrous sulfate 325 (65 FE) MG tablet Take 1 tablet (325 mg total) by mouth daily with breakfast. 90 tablet 3  . furosemide (LASIX) 20 MG tablet Take 1 tablet (20 mg total) by mouth every other day. 20 tablet 0  . lisinopril (PRINIVIL,ZESTRIL) 10 MG tablet Take 1 tablet (10 mg total) by mouth daily. 90 tablet 1  . magnesium oxide (MAG-OX) 400 MG tablet TAKE 1 TABLET BY MOUTH EVERY DAY 90 tablet 3  . spironolactone (ALDACTONE) 25 MG tablet TAKE 1 TABLET BY MOUTH EVERY DAY 30 tablet 2   No current facility-administered medications for this visit.     Allergies:   Decongestant [pseudoephedrine hcl er]    Social History:  The patient  reports that she has never smoked. She has never used smokeless tobacco. She reports that she does not drink alcohol or use drugs.   Family History:  The patient's family history includes Angina in her mother; Breast cancer in her maternal grandmother; Colon cancer in her maternal grandmother and unknown relative; Dementia in her mother; Diabetes in her brother and father; Heart attack in her maternal grandmother; Heart failure in her maternal grandmother; Intellectual disability in her unknown relative; Kidney disease in her father; Sleep apnea in her brother; Stroke in her maternal grandmother.    ROS:  Please see the history of present illness.   Otherwise, review of systems are positive for none.   All other systems are reviewed and negative.    PHYSICAL EXAM: VS:  BP (!) 97/53   Pulse 78   Ht 5' 4.5" (1.638 m)   Wt 190 lb 9.6 oz (86.5 kg)   BMI 32.21 kg/m  , BMI Body mass index is 32.21 kg/m. GENERAL:  Well appearing HEENT:  Pupils equal round and reactive, fundi not visualized,  oral mucosa unremarkable NECK:  No jugular venous distention, waveform within normal limits, carotid upstroke brisk and symmetric, no bruits, no thyromegaly LYMPHATICS:  No cervical adenopathy LUNGS:  Clear to auscultation bilaterally HEART:  RRR.  PMI not displaced or sustained,S1 and S2 within normal limits, no S3, no S4, no clicks, no rubs, no murmurs ABD:  Flat, positive bowel sounds normal in frequency in pitch, no bruits, no rebound, no guarding, no midline pulsatile mass, no hepatomegaly, no splenomegaly EXT:  2 plus pulses throughout, no edema, no cyanosis no clubbing SKIN:  No rashes no nodules NEURO:  Cranial nerves II through XII grossly intact, motor grossly intact throughout PSYCH:  Cognitively intact, oriented to person place and  time    EKG:  EKG is not ordered today.  Echo 01/18/17:  Study Conclusions  - Left ventricle: The cavity size was normal. Wall thickness was   normal. Systolic function was normal. The estimated ejection   fraction was in the range of 50% to 55%. Wall motion was normal;   there were no regional wall motion abnormalities. Features are   consistent with a pseudonormal left ventricular filling pattern,   with concomitant abnormal relaxation and increased filling   pressure (grade 2 diastolic dysfunction). - Mitral valve: There was mild regurgitation.  Echo 11/2011:  LVEF 15%.  Diffuse hypokinesis.  Moderate mitral regurgitation.  IVC dilated with normal respirophasic variation.  Recent Labs: 09/30/2017: BUN 17; Creatinine, Ser 1.26; Hemoglobin 9.9; Platelets 287.0; Potassium 4.3; Sodium 142    Lipid Panel    Component Value Date/Time   CHOL 127 01/11/2017 0913   TRIG 55 01/11/2017 0913   HDL 42 01/11/2017 0913   CHOLHDL 3.0 01/11/2017 0913   VLDL 11 01/11/2017 0913   LDLCALC 74 01/11/2017 0913      Wt Readings from Last 3 Encounters:  03/02/18 190 lb 9.6 oz (86.5 kg)  02/06/18 193 lb 9.6 oz (87.8 kg)  09/30/17 198 lb 8 oz (90 kg)       ASSESSMENT AND PLAN:  # Chronic systolic and diastolic heart failure: LVEF improved to 50 to 55% 12/2016.  She has a CRT-D device that is followed by Dr. Caryl Comes and functioning well.  We will reduce lisinopril to 10 mg daily given her dizziness.  Continue carvedilol, furosemide, BiDil, and spironolactone.  # Hypertension: BP well controlled as above.  Reduce lisinopril as above.  Continue carvedilol, lisinopril and spironolactone.   # Obesity: Ms. Fleet was congratulated on her exercise and weight loss.   Current medicines are reviewed at length with the patient today.  The patient does not have concerns regarding medicines.  The following changes have been made:  Reduce lisinopril to 10mg  daily  Labs/ tests ordered today include:  No orders of the defined types were placed in this encounter.    Disposition:   FU with Azhane Eckart C. Oval Linsey, MD, [facility] in 6 months.      Signed, Geniene List C. Oval Linsey, MD, [facility]  03/04/2018 7:30 PM    [place]

## 2018-03-04 ENCOUNTER — Encounter: Payer: Self-pay | Admitting: Cardiovascular Disease

## 2018-03-05 ENCOUNTER — Other Ambulatory Visit (HOSPITAL_COMMUNITY): Payer: Self-pay | Admitting: Cardiology

## 2018-03-16 ENCOUNTER — Ambulatory Visit (INDEPENDENT_AMBULATORY_CARE_PROVIDER_SITE_OTHER): Payer: Medicare Other | Admitting: *Deleted

## 2018-03-16 DIAGNOSIS — I428 Other cardiomyopathies: Secondary | ICD-10-CM

## 2018-03-16 NOTE — Progress Notes (Signed)
Remote ICD transmission.   

## 2018-03-27 ENCOUNTER — Ambulatory Visit
Admission: RE | Admit: 2018-03-27 | Discharge: 2018-03-27 | Disposition: A | Discharge status: Home / Self Care | Payer: Medicare Other | Source: Ambulatory Visit | Attending: Family Medicine | Admitting: Family Medicine

## 2018-03-27 DIAGNOSIS — Z1231 Encounter for screening mammogram for malignant neoplasm of breast: Secondary | ICD-10-CM | POA: Diagnosis not present

## 2018-04-04 ENCOUNTER — Ambulatory Visit (INDEPENDENT_AMBULATORY_CARE_PROVIDER_SITE_OTHER): Payer: Medicare Other | Admitting: Family Medicine

## 2018-04-04 ENCOUNTER — Encounter: Payer: Self-pay | Admitting: Family Medicine

## 2018-04-04 ENCOUNTER — Other Ambulatory Visit: Payer: Self-pay | Admitting: Cardiology

## 2018-04-04 VITALS — BP 80/56 | HR 90 | Temp 98.6°F | Wt 190.0 lb

## 2018-04-04 DIAGNOSIS — J00 Acute nasopharyngitis [common cold]: Secondary | ICD-10-CM

## 2018-04-04 MED ORDER — FLUTICASONE PROPIONATE 50 MCG/ACT NA SUSP: 1.0000 | Freq: Every day | NASAL | 0 refills | Status: DC

## 2018-04-04 NOTE — Progress Notes (Signed)
Subjective:    Patient ID: Cynthia Roth, female    DOB: Feb 14, 1951, 67 y.o.   MRN: [id number]  No chief complaint on file.   HPI Patient was seen today for acute concern.  Pt endorses sneezing, nasal drainage, cough, sore throat, hoarseness, and occasional headache for the last 3 days.  Pt unsure of sick contacts, though did return from a trip visiting in [place] and sent home exams.  Pt also notes her brother who lives in the home with her has a cough currently.  Pt denies fever, ear pain or pressure, facial pain or pressure.  Pt has taken Chloraseptic spray and use cough drops for her symptoms  Past Medical History:  Diagnosis Date  . AICD (automatic cardioverter/defibrillator) present   . Allergy   . Biventricular ICD -Medtronic    DOI 12/13 - Medtronic Viva XR CRT-D defibrillator, serial #BLF [id number] H.    . CHF (congestive heart failure) ([place])   . Chronic systolic heart failure ([place]) 12/02/2011  . Colon polyps    Dr Mar Daring [facility]  . Dyslipidemia 12/02/2011  . Gout    "very seldom" (05/10/2012)  . Hx of colonic polyps   . Hyperlipidemia   . Hypertension    "used to have this; now my BP is low" (05/10/2012)  . LBBB (left bundle branch block) 12/02/2011  . Nonischemic cardiomyopathy ([place])   . Pre-diabetes   . Prediabetes   . Presence of permanent cardiac pacemaker   . Tubular adenoma 1999   history of    Allergies  Allergen Reactions  . Decongestant [Pseudoephedrine Hcl Er]     PATIENT CAN'T REMEMBER     ROS General: Denies fever, chills, night sweats, changes in weight, changes in appetite HEENT: Denies headaches, ear pain, changes in vision  +HA, rhinorrhea, sore throat, drainage, sneezing CV: Denies CP, palpitations, SOB, orthopnea Pulm: Denies SOB, wheezing  +cough GI: Denies abdominal pain, nausea, vomiting, diarrhea, constipation GU: Denies dysuria, hematuria, frequency, vaginal discharge Msk: Denies muscle cramps, joint pains Neuro: Denies weakness,  numbness, tingling Skin: Denies rashes, bruising Psych: Denies depression, anxiety, hallucinations    Objective:    Blood pressure (!) 80/56, pulse 90, temperature 98.6 F (37 C), temperature source Oral, weight 190 lb (86.2 kg), SpO2 98 %.   Gen. Pleasant, well-nourished, in no distress, normal affect   HEENT: [place]/AT, face symmetric, R eye watery, no scleral icterus, PERRLA, nares patent without drainage, pharynx with mild erythema, no exudate.  TMs normal b/l.  No cervical lymphadenopathy. Lungs: no accessory muscle use, CTAB, no wheezes or rales Cardiovascular: RRR, no m/r/g, no peripheral edema Neuro:  A&Ox3, CN II-XII intact, normal gait  Wt Readings from Last 3 Encounters:  04/04/18 190 lb (86.2 kg)  03/02/18 190 lb 9.6 oz (86.5 kg)  02/06/18 193 lb 9.6 oz (87.8 kg)    Lab Results  Component Value Date   WBC 4.7 09/30/2017   HGB 9.9 (L) 09/30/2017   HCT 28.9 (L) 09/30/2017   PLT 287.0 09/30/2017   GLUCOSE 97 09/30/2017   CHOL 127 01/11/2017   TRIG 55 01/11/2017   HDL 42 01/11/2017   LDLCALC 74 01/11/2017   ALT 10 06/16/2015   AST 15 06/16/2015   NA 142 09/30/2017   K 4.3 09/30/2017   CL 108 09/30/2017   CREATININE 1.26 (H) 09/30/2017   BUN 17 09/30/2017   CO2 27 09/30/2017   TSH 2.404 11/30/2011   INR 1.3 (H) 05/03/2012   HGBA1C 6.2 09/30/2017  Assessment/Plan:  Acute nasopharyngitis  -symptomatic care -given handout - Plan: fluticasone (FLONASE) 50 MCG/ACT nasal spray  F/u prn  Grier Mitts, MD

## 2018-04-04 NOTE — Patient Instructions (Addendum)
You can try taking Coricidin HBP for your cold symptoms.  He can be found on the shelf at your local drugstore.  You can also continue using Chloraseptic spray or warm salt water to gargle your throat.  Upper Respiratory Infection, Adult Most upper respiratory infections (URIs) are a viral infection of the air passages leading to the lungs. A URI affects the nose, throat, and upper air passages. The most common type of URI is nasopharyngitis and is typically referred to as "the common cold." URIs run their course and usually go away on their own. Most of the time, a URI does not require medical attention, but sometimes a bacterial infection in the upper airways can follow a viral infection. This is called a secondary infection. Sinus and middle ear infections are common types of secondary upper respiratory infections. Bacterial pneumonia can also complicate a URI. A URI can worsen asthma and chronic obstructive pulmonary disease (COPD). Sometimes, these complications can require emergency medical care and may be life threatening. What are the causes? Almost all URIs are caused by viruses. A virus is a type of germ and can spread from one person to another. What increases the risk? You may be at risk for a URI if:  You smoke.  You have chronic heart or lung disease.  You have a weakened defense (immune) system.  You are very young or very old.  You have nasal allergies or asthma.  You work in crowded or poorly ventilated areas.  You work in health care facilities or schools.  What are the signs or symptoms? Symptoms typically develop 2-3 days after you come in contact with a cold virus. Most viral URIs last 7-10 days. However, viral URIs from the influenza virus (flu virus) can last 14-18 days and are typically more severe. Symptoms may include:  Runny or stuffy (congested) nose.  Sneezing.  Cough.  Sore throat.  Headache.  Fatigue.  Fever.  Loss of appetite.  Pain in your  forehead, behind your eyes, and over your cheekbones (sinus pain).  Muscle aches.  How is this diagnosed? Your health care provider may diagnose a URI by:  Physical exam.  Tests to check that your symptoms are not due to another condition such as: ? Strep throat. ? Sinusitis. ? Pneumonia. ? Asthma.  How is this treated? A URI goes away on its own with time. It cannot be cured with medicines, but medicines may be prescribed or recommended to relieve symptoms. Medicines may help:  Reduce your fever.  Reduce your cough.  Relieve nasal congestion.  Follow these instructions at home:  Take medicines only as directed by your health care provider.  Gargle warm saltwater or take cough drops to comfort your throat as directed by your health care provider.  Use a warm mist humidifier or inhale steam from a shower to increase air moisture. This may make it easier to breathe.  Drink enough fluid to keep your urine clear or pale yellow.  Eat soups and other clear broths and maintain good nutrition.  Rest as needed.  Return to work when your temperature has returned to normal or as your health care provider advises. You may need to stay home longer to avoid infecting others. You can also use a face mask and careful hand washing to prevent spread of the virus.  Increase the usage of your inhaler if you have asthma.  Do not use any tobacco products, including cigarettes, chewing tobacco, or electronic cigarettes. If you need help  quitting, ask your health care provider. How is this prevented? The best way to protect yourself from getting a cold is to practice good hygiene.  Avoid oral or hand contact with people with cold symptoms.  Wash your hands often if contact occurs.  There is no clear evidence that vitamin C, vitamin E, echinacea, or exercise reduces the chance of developing a cold. However, it is always recommended to get plenty of rest, exercise, and practice good  nutrition. Contact a health care provider if:  You are getting worse rather than better.  Your symptoms are not controlled by medicine.  You have chills.  You have worsening shortness of breath.  You have brown or red mucus.  You have yellow or brown nasal discharge.  You have pain in your face, especially when you bend forward.  You have a fever.  You have swollen neck glands.  You have pain while swallowing.  You have white areas in the back of your throat. Get help right away if:  You have severe or persistent: ? Headache. ? Ear pain. ? Sinus pain. ? Chest pain.  You have chronic lung disease and any of the following: ? Wheezing. ? Prolonged cough. ? Coughing up blood. ? A change in your usual mucus.  You have a stiff neck.  You have changes in your: ? Vision. ? Hearing. ? Thinking. ? Mood. This information is not intended to replace advice given to you by your health care provider. Make sure you discuss any questions you have with your health care provider. Document Released: 11/10/2000 Document Revised: 01/18/2016 Document Reviewed: 08/22/2013 Elsevier Interactive Patient Education  [employer].

## 2018-04-18 ENCOUNTER — Other Ambulatory Visit (HOSPITAL_COMMUNITY): Payer: Self-pay | Admitting: Internal Medicine

## 2018-05-18 LAB — CUP PACEART REMOTE DEVICE CHECK
Battery Remaining Longevity: 7 mo
Battery Voltage: 2.83 V
Brady Statistic AP VP Percent: 0.04 %
Brady Statistic AP VS Percent: 0.06 %
Brady Statistic AS VP Percent: 98.6 %
Brady Statistic AS VS Percent: 1.3 %
Brady Statistic RA Percent Paced: 0.1 %
Brady Statistic RV Percent Paced: 5.46 %
Date Time Interrogation Session: 20191017052403
HighPow Impedance: 71 Ohm
Implantable Lead Implant Date: 20131211
Implantable Lead Implant Date: 20131211
Implantable Lead Implant Date: 20131211
Implantable Lead Location: 753858
Implantable Lead Location: 753859
Implantable Lead Location: 753860
Implantable Lead Model: 181
Implantable Lead Model: 4396
Implantable Lead Model: 5076
Implantable Lead Serial Number: 32342
Implantable Pulse Generator Implant Date: 20131211
Lead Channel Impedance Value: 361 Ohm
Lead Channel Impedance Value: 475 Ohm
Lead Channel Impedance Value: 589 Ohm
Lead Channel Impedance Value: 608 Ohm
Lead Channel Impedance Value: 646 Ohm
Lead Channel Impedance Value: 931 Ohm
Lead Channel Pacing Threshold Amplitude: 0.625 V
Lead Channel Pacing Threshold Amplitude: 0.625 V
Lead Channel Pacing Threshold Amplitude: 3 V
Lead Channel Pacing Threshold Pulse Width: 0.4 ms
Lead Channel Pacing Threshold Pulse Width: 0.4 ms
Lead Channel Pacing Threshold Pulse Width: 0.8 ms
Lead Channel Sensing Intrinsic Amplitude: 10.25 mV
Lead Channel Sensing Intrinsic Amplitude: 10.25 mV
Lead Channel Sensing Intrinsic Amplitude: 3.875 mV
Lead Channel Sensing Intrinsic Amplitude: 3.875 mV
Lead Channel Setting Pacing Amplitude: 2 V
Lead Channel Setting Pacing Amplitude: 2.5 V
Lead Channel Setting Pacing Amplitude: 2.5 V
Lead Channel Setting Pacing Pulse Width: 0.4 ms
Lead Channel Setting Pacing Pulse Width: 1 ms
Lead Channel Setting Sensing Sensitivity: 0.45 mV

## 2018-06-15 ENCOUNTER — Ambulatory Visit (INDEPENDENT_AMBULATORY_CARE_PROVIDER_SITE_OTHER): Payer: Medicare Other

## 2018-06-15 DIAGNOSIS — I428 Other cardiomyopathies: Secondary | ICD-10-CM

## 2018-06-15 DIAGNOSIS — I5042 Chronic combined systolic (congestive) and diastolic (congestive) heart failure: Secondary | ICD-10-CM

## 2018-06-16 NOTE — Progress Notes (Signed)
Remote ICD transmission.   

## 2018-06-17 LAB — CUP PACEART REMOTE DEVICE CHECK
Battery Remaining Longevity: 5 mo
Battery Voltage: 2.81 V
Brady Statistic AP VP Percent: 0.01 %
Brady Statistic AP VS Percent: 0.06 %
Brady Statistic AS VP Percent: 98.46 %
Brady Statistic AS VS Percent: 1.46 %
Brady Statistic RA Percent Paced: 0.07 %
Brady Statistic RV Percent Paced: 7.57 %
Date Time Interrogation Session: 20200116093723
HighPow Impedance: 68 Ohm
Implantable Lead Implant Date: 20131211
Implantable Lead Implant Date: 20131211
Implantable Lead Implant Date: 20131211
Implantable Lead Location: 753858
Implantable Lead Location: 753859
Implantable Lead Location: 753860
Implantable Lead Model: 181
Implantable Lead Model: 4396
Implantable Lead Model: 5076
Implantable Lead Serial Number: 32342
Implantable Pulse Generator Implant Date: 20131211
Lead Channel Impedance Value: 361 Ohm
Lead Channel Impedance Value: 456 Ohm
Lead Channel Impedance Value: 551 Ohm
Lead Channel Impedance Value: 551 Ohm
Lead Channel Impedance Value: 551 Ohm
Lead Channel Impedance Value: 931 Ohm
Lead Channel Pacing Threshold Amplitude: 0.5 V
Lead Channel Pacing Threshold Amplitude: 0.75 V
Lead Channel Pacing Threshold Amplitude: 2.5 V
Lead Channel Pacing Threshold Pulse Width: 0.4 ms
Lead Channel Pacing Threshold Pulse Width: 0.4 ms
Lead Channel Pacing Threshold Pulse Width: 1 ms
Lead Channel Sensing Intrinsic Amplitude: 2.5 mV
Lead Channel Sensing Intrinsic Amplitude: 2.5 mV
Lead Channel Sensing Intrinsic Amplitude: 9.625 mV
Lead Channel Sensing Intrinsic Amplitude: 9.625 mV
Lead Channel Setting Pacing Amplitude: 2 V
Lead Channel Setting Pacing Amplitude: 2.5 V
Lead Channel Setting Pacing Amplitude: 2.5 V
Lead Channel Setting Pacing Pulse Width: 0.4 ms
Lead Channel Setting Pacing Pulse Width: 1 ms
Lead Channel Setting Sensing Sensitivity: 0.45 mV

## 2018-07-07 ENCOUNTER — Other Ambulatory Visit (HOSPITAL_COMMUNITY): Payer: Self-pay | Admitting: Internal Medicine

## 2018-07-07 ENCOUNTER — Other Ambulatory Visit: Payer: Self-pay

## 2018-07-07 ENCOUNTER — Other Ambulatory Visit: Payer: Self-pay | Admitting: Family Medicine

## 2018-07-07 ENCOUNTER — Other Ambulatory Visit (HOSPITAL_COMMUNITY): Payer: Self-pay | Admitting: Cardiology

## 2018-07-07 DIAGNOSIS — D649 Anemia, unspecified: Secondary | ICD-10-CM

## 2018-07-07 DIAGNOSIS — J00 Acute nasopharyngitis [common cold]: Secondary | ICD-10-CM

## 2018-07-07 MED ORDER — FLUTICASONE PROPIONATE 50 MCG/ACT NA SUSP: 1.0000 | Freq: Every day | NASAL | 0 refills | Status: DC

## 2018-07-10 NOTE — Telephone Encounter (Signed)
Future refills should be sent to current cardiologist

## 2018-09-06 ENCOUNTER — Telehealth: Payer: Self-pay

## 2018-09-06 NOTE — Telephone Encounter (Signed)
Called and left patient a message about upcoming appointment with Dr. Caryl Comes. I was calling to see if she wanted to switch her visit to a video visit? We can send information to her email address. She would need access to a smart phone, tablet, or computer.

## 2018-09-07 ENCOUNTER — Other Ambulatory Visit: Payer: Self-pay | Admitting: Internal Medicine

## 2018-09-07 MED ORDER — ATORVASTATIN CALCIUM 20 MG PO TABS: 20.0000 mg | ORAL_TABLET | Freq: Every day | ORAL | 0 refills | Status: DC

## 2018-09-07 NOTE — Telephone Encounter (Signed)
Follow up     Pt is returning phone call  Pt does have an email address  Email : [email or web address]  Please call back

## 2018-09-07 NOTE — Telephone Encounter (Signed)
Returned call to patient, she states that she can do the video visit on 09/14/2018. Emailed consent to patient. She does not have mychart, so we will email her the link for [email or web address]; changed to virtual visit.

## 2018-09-07 NOTE — Telephone Encounter (Signed)
New Message     *STAT* If patient is at the pharmacy, call can be transferred to refill team.   1. Which medications need to be refilled? (please list name of each medication and dose if known) Atrovastatin 20mg    2. Which pharmacy/location (including street and city if local pharmacy) is medication to be sent to? [facility] on [address]   3. Do they need a 30 day or 90 day supply? [place]

## 2018-09-14 ENCOUNTER — Other Ambulatory Visit: Payer: Self-pay

## 2018-09-14 ENCOUNTER — Ambulatory Visit (INDEPENDENT_AMBULATORY_CARE_PROVIDER_SITE_OTHER): Payer: Medicare Other | Admitting: *Deleted

## 2018-09-14 ENCOUNTER — Telehealth (INDEPENDENT_AMBULATORY_CARE_PROVIDER_SITE_OTHER): Payer: Medicare Other | Admitting: Internal Medicine

## 2018-09-14 ENCOUNTER — Telehealth: Payer: Self-pay | Admitting: Internal Medicine

## 2018-09-14 VITALS — BP 106/64 | HR 79 | Ht 64.0 in | Wt 187.2 lb

## 2018-09-14 DIAGNOSIS — I5042 Chronic combined systolic (congestive) and diastolic (congestive) heart failure: Secondary | ICD-10-CM

## 2018-09-14 DIAGNOSIS — D649 Anemia, unspecified: Secondary | ICD-10-CM

## 2018-09-14 DIAGNOSIS — I447 Left bundle-branch block, unspecified: Secondary | ICD-10-CM

## 2018-09-14 DIAGNOSIS — I428 Other cardiomyopathies: Secondary | ICD-10-CM

## 2018-09-14 DIAGNOSIS — Z95810 Presence of automatic (implantable) cardiac defibrillator: Secondary | ICD-10-CM | POA: Diagnosis not present

## 2018-09-14 LAB — CUP PACEART REMOTE DEVICE CHECK
Battery Remaining Longevity: 3 mo
Battery Voltage: 2.75 V
Brady Statistic AP VP Percent: 0.01 %
Brady Statistic AP VS Percent: 0.06 %
Brady Statistic AS VP Percent: 98.61 %
Brady Statistic AS VS Percent: 1.33 %
Brady Statistic RA Percent Paced: 0.07 %
Brady Statistic RV Percent Paced: 4.73 %
Date Time Interrogation Session: 20200416071706
HighPow Impedance: 84 Ohm
Implantable Lead Implant Date: 20131211
Implantable Lead Implant Date: 20131211
Implantable Lead Implant Date: 20131211
Implantable Lead Location: 753858
Implantable Lead Location: 753859
Implantable Lead Location: 753860
Implantable Lead Model: 181
Implantable Lead Model: 4396
Implantable Lead Model: 5076
Implantable Lead Serial Number: 32342
Implantable Pulse Generator Implant Date: 20131211
Lead Channel Impedance Value: 399 Ohm
Lead Channel Impedance Value: 532 Ohm
Lead Channel Impedance Value: 532 Ohm
Lead Channel Impedance Value: 646 Ohm
Lead Channel Impedance Value: 646 Ohm
Lead Channel Impedance Value: 931 Ohm
Lead Channel Pacing Threshold Amplitude: 0.625 V
Lead Channel Pacing Threshold Amplitude: 0.625 V
Lead Channel Pacing Threshold Amplitude: 2.5 V
Lead Channel Pacing Threshold Pulse Width: 0.4 ms
Lead Channel Pacing Threshold Pulse Width: 0.4 ms
Lead Channel Pacing Threshold Pulse Width: 1 ms
Lead Channel Sensing Intrinsic Amplitude: 11 mV
Lead Channel Sensing Intrinsic Amplitude: 3.25 mV
Lead Channel Setting Pacing Amplitude: 2 V
Lead Channel Setting Pacing Amplitude: 2.5 V
Lead Channel Setting Pacing Amplitude: 2.5 V
Lead Channel Setting Pacing Pulse Width: 0.4 ms
Lead Channel Setting Pacing Pulse Width: 1 ms
Lead Channel Setting Sensing Sensitivity: 0.45 mV

## 2018-09-14 NOTE — Progress Notes (Signed)
Electrophysiology TeleHealth Note   Due to national recommendations of social distancing due to COVID 19, an audio/video telehealth visit is felt to be most appropriate for this patient at this time.  See MyChart message from today for the patient's consent to telehealth for [facility].   Date:  09/14/2018   ID:  Cynthia, Roth April 05, 1951, MRN [id number]  Location: patient's home  Provider location: [address] [place]  Evaluation Performed: Follow-up visit  PCP:  [name], MD  Cardiologist:   DM Electrophysiologist:  SK   Chief Complaint:  Congestive heart failure  History of Present Illness:    Cynthia Roth is a 68 y.o. female who presents via audio/video conferencing for a telehealth visit today.  Since last being seen in our clinic, the patient reports doing pretty well.  She has minimal dyspnea on exertion.  No nocturnal dyspnea orthopnea or peripheral edema.  She lives in her home with her brother nieces grandnieces.  She spends most of her time in her bedroom and feels a little bit a prisoner in her own home.  She is on iron replacement therapy without interval testing  DATE TEST EF   10/13 Echo   15 %   1/17 Echo   55-60 %   8/18 Echo  50-55%    Date Cr K Hgb  2/16   13.8  8/18 1.31 4.7 9.9  5/19  1.26 4.3 9.9     The patient denies symptoms of fevers, chills, cough, or new SOB worrisome for COVID 19.    Past Medical History:  Diagnosis Date  . AICD (automatic cardioverter/defibrillator) present   . Allergy   . Biventricular ICD -Medtronic    DOI 12/13 - Medtronic Viva XR CRT-D defibrillator, serial #[id number] H.    . CHF (congestive heart failure) ([place])   . Chronic systolic heart failure ([place]) 12/02/2011  . Colon polyps    Dr Mar Daring [facility]  . Dyslipidemia 12/02/2011  . Gout    "very seldom" (05/10/2012)  . Hx of colonic polyps   . Hyperlipidemia   . Hypertension    "used to have this;  now my BP is low" (05/10/2012)  . LBBB (left bundle branch block) 12/02/2011  . Nonischemic cardiomyopathy ([place])   . Pre-diabetes   . Prediabetes   . Presence of permanent cardiac pacemaker   . Tubular adenoma 1999   history of    Past Surgical History:  Procedure Laterality Date  . BI-VENTRICULAR IMPLANTABLE CARDIOVERTER DEFIBRILLATOR N/A 05/10/2012   Procedure: BI-VENTRICULAR IMPLANTABLE CARDIOVERTER DEFIBRILLATOR  (CRT-D);  Surgeon: Deboraha Sprang, MD;  Location: [facility] CATH LAB;  Service: Cardiovascular;  Laterality: N/A;  . CARDIAC CATHETERIZATION  12-03-11   selectie coronary angiography  . CARDIAC DEFIBRILLATOR PLACEMENT  05/10/2012   CRT-D implantation (05/10/2012)  . COLONOSCOPY WITH PROPOFOL N/A 03/17/2017   Procedure: COLONOSCOPY WITH PROPOFOL;  Surgeon: Juanita Craver, MD;  Location: [facility] ENDOSCOPY;  Service: Endoscopy;  Laterality: N/A;  . LEFT HEART CATHETERIZATION WITH CORONARY ANGIOGRAM N/A 12/03/2011   Procedure: LEFT HEART CATHETERIZATION WITH CORONARY ANGIOGRAM;  Surgeon: Larey Dresser, MD;  Location: [facility] CATH LAB;  Service: Cardiovascular;  Laterality: N/A;  . PARTIAL HYSTERECTOMY  1980's    Current Outpatient Medications  Medication Sig Dispense Refill  . aspirin 81 MG tablet Take 81 mg by mouth daily.    [name] atorvastatin (LIPITOR) 20 MG tablet Take 1 tablet (20 mg total) by mouth daily. 90 tablet  0  . B COMPLEX VITAMINS SL Place 1 drop under the tongue daily.    . carvedilol (COREG) 12.5 MG tablet TAKE 1 TABLET (12.5 MG TOTAL) BY MOUTH 2 (TWO) TIMES DAILY WITH A MEAL. 120 tablet 3  . Cholecalciferol (VITAMIN D3) 2000 units TABS Take 1 tablet by mouth daily.    . Cinnamon 500 MG capsule Take 1,000 mg by mouth daily.    . Cyanocobalamin (VITAMIN B-12 PO) Take 5,000 mcg by mouth daily.    . ferrous sulfate 325 (65 FE) MG tablet TAKE 1 TABLET BY MOUTH EVERY DAY WITH BREAKFAST 90 tablet 3  . furosemide (LASIX) 20 MG tablet TAKE 1 TABLET BY MOUTH EVERY OTHER DAY 20 tablet 3  .  lisinopril (ZESTRIL) 20 MG tablet     . magnesium oxide (MAG-OX) 400 MG tablet TAKE 1 TABLET BY MOUTH EVERY DAY (Patient taking differently: Take 250 mg by mouth daily. ) 90 tablet 3  . spironolactone (ALDACTONE) 25 MG tablet TAKE 1 TABLET BY MOUTH EVERY DAY 30 tablet 2   No current facility-administered medications for this visit.     Allergies:   Decongestant [pseudoephedrine hcl er]   Social History:  The patient  reports that she has never smoked. She has never used smokeless tobacco. She reports that she does not drink alcohol or use drugs.   Family History:  The patient's   family history includes Angina in her mother; Breast cancer in her maternal grandmother; Colon cancer in her maternal grandmother and unknown relative; Dementia in her mother; Diabetes in her brother and father; Heart attack in her maternal grandmother; Heart failure in her maternal grandmother; Intellectual disability in her unknown relative; Kidney disease in her father; Sleep apnea in her brother; Stroke in her maternal grandmother.   ROS:  Please see the history of present illness.   All other systems are personally reviewed and negative.    Exam:    Vital Signs:  BP 106/64   Pulse 79   Ht 5\' 4"  (1.626 m)   Wt 187 lb 3.2 oz (84.9 kg)   BMI 32.13 kg/m     Well appearing, alert and conversant, regular work of breathing,  good skin color Eyes- anicteric, neuro- grossly intact, skin- no apparent rash or lesions or cyanosis, mouth- oral mucosa is pink   Labs/Other Tests and Data Reviewed:    Recent Labs: 09/30/2017: BUN 17; Creatinine, Ser 1.26; Hemoglobin 9.9; Platelets 287.0; Potassium 4.3; Sodium 142   Wt Readings from Last 3 Encounters:  09/14/18 187 lb 3.2 oz (84.9 kg)  04/04/18 190 lb (86.2 kg)  03/02/18 190 lb 9.6 oz (86.5 kg)     Other studies personally reviewed: Additional studies/ records that were reviewed today include:     Last device remote is reviewed from [place] PDF dated *1/20  which reveals normal device function, no arrhythmias Battery approaching ERI    ASSESSMENT & PLAN:    Nonischemic cardiomyopathy  Anemia  HFpEF  Implantable defibrillator-Medtronic-CRT   Euvolemic continue current meds  Device is approaching ERI.  She has been on iron replacement therapy; will check blood work post [place] probably concurrent with needing to replace device       COVID 19 screen The patient denies symptoms of COVID 19 at this time.  The importance of social distancing was discussed today.  Follow-up:  8m with [facility] and 12 m with me But device is almost at ERI  I have advised her of the alert for ERI.  Low however you  Current medicines are reviewed at length with the patient today.   The patient does not have concerns regarding her medicines.  The following changes were made today:  none  Labs/ tests ordered today include:   No orders of the defined types were placed in this encounter.   Future tests ( post COVID )  At the time of ERI alert   Patient Risk:  after full review of this patients clinical status, I feel that they are at moderate risk at this time.  Today, I have spent 16 minutes with the patient with telehealth technology discussing the above.  Signed, Virl Axe, MD  09/14/2018 2:30 PM     [place]  [phone] (office) [phone] (fax)

## 2018-09-14 NOTE — Telephone Encounter (Signed)
New Message:     Patient calling concering her appt today and states she doesn't have a code.

## 2018-09-21 ENCOUNTER — Encounter: Payer: Self-pay | Admitting: Cardiology

## 2018-09-21 NOTE — Progress Notes (Signed)
Remote ICD transmission.   

## 2018-09-28 ENCOUNTER — Other Ambulatory Visit: Payer: Self-pay | Admitting: Internal Medicine

## 2018-09-28 ENCOUNTER — Telehealth: Payer: Self-pay | Admitting: *Deleted

## 2018-09-28 NOTE — Telephone Encounter (Signed)
[facility] [phone] is requesting refill for Bidil 20-37.5 mg tablets. Mediation request is for 1/2 tablet three times a day (TID). This is not on patient's medication list. Please advise. Thank you.

## 2018-09-29 NOTE — Telephone Encounter (Signed)
Pt calling stating that she requested a refill on Bidil and it was denied. This medication was taken off of pt's medication list. Would Dr. Caryl Comes like to prescribe this medication again so pt can get her medication? Please address

## 2018-09-29 NOTE — Telephone Encounter (Signed)
Per Dr Caryl Comes, [facility] for pt to restart her Bidil  20-37.5mg .

## 2018-09-29 NOTE — Telephone Encounter (Signed)
Please do   On Dr Quintella Reichert notes

## 2018-10-03 ENCOUNTER — Telehealth: Payer: Self-pay | Admitting: Internal Medicine

## 2018-10-03 NOTE — Telephone Encounter (Signed)
  Patient thought that at her visit in April that Dr Caryl Comes wanted her to do lab work. I do not see orders and visit note says no lab work ordered but patient is adamant that it was supposed to be some ordered. Please advise.

## 2018-10-04 ENCOUNTER — Other Ambulatory Visit: Payer: Self-pay | Admitting: Cardiology

## 2018-10-05 NOTE — Telephone Encounter (Signed)
LVM for pt to return call

## 2018-10-15 ENCOUNTER — Other Ambulatory Visit (HOSPITAL_COMMUNITY): Payer: Self-pay | Admitting: Internal Medicine

## 2018-10-26 ENCOUNTER — Other Ambulatory Visit (HOSPITAL_COMMUNITY): Payer: Self-pay | Admitting: Internal Medicine

## 2018-11-02 ENCOUNTER — Other Ambulatory Visit: Payer: Self-pay | Admitting: Internal Medicine

## 2018-11-02 MED ORDER — ISOSORB DINITRATE-HYDRALAZINE 20-37.5 MG PO TABS: ORAL_TABLET | ORAL | 0 refills | Status: DC

## 2018-11-02 NOTE — Telephone Encounter (Signed)
Pt's medication was sent to pt's pharmacy as requested. Confirmation received.  °

## 2018-12-05 ENCOUNTER — Other Ambulatory Visit: Payer: Self-pay | Admitting: Internal Medicine

## 2018-12-05 MED ORDER — ATORVASTATIN CALCIUM 20 MG PO TABS: 20.0000 mg | ORAL_TABLET | Freq: Every day | ORAL | 2 refills | Status: DC

## 2018-12-06 ENCOUNTER — Telehealth: Payer: Self-pay | Admitting: Internal Medicine

## 2018-12-06 NOTE — Telephone Encounter (Signed)
Pt tried to send the transmission and received the error code 3230. I told her to let the handle charge for an hour and try again. I told her if she receives the same error code to call [employer] tech support first thing in the morning to trouble shoot the monitor. Pt verbalized understanding.

## 2018-12-06 NOTE — Telephone Encounter (Signed)
°  1. Has your device fired? no  2. Is you device beeping? Yes, every morning at the same time.   3. Are you experiencing draining or swelling at device site? no  4. Are you calling to see if we received your device transmission? no  5. Have you passed out? no    Please route to [place]

## 2018-12-06 NOTE — Telephone Encounter (Signed)
LMOVM for pt to send a manual transmission with her home monitor. I left my direct office number for the pt to call back.

## 2018-12-07 NOTE — Telephone Encounter (Signed)
Pt called to let us know she spoke with [employer] and her monitor is not working. They are sending another one and it should arrive in 7-10 days.   She states her device began beeping for ERI at the end of MAY, but she had not let us know.   She needs a visit ASAP to discuss Gen change; Thank you.   She is unable to send a transmission, so we do not have a way to confirm her date of ERI without bringing her in.    Legrand Como [address]" [place], PA-C 12/07/2018 8:39 AM

## 2018-12-07 NOTE — Telephone Encounter (Signed)
Patient is scheduled to see Dr. Caryl Comes on 12/11/18 at 8:45am.

## 2018-12-08 ENCOUNTER — Encounter: Payer: Self-pay | Admitting: Internal Medicine

## 2018-12-08 ENCOUNTER — Telehealth: Payer: Self-pay | Admitting: Internal Medicine

## 2018-12-08 NOTE — Telephone Encounter (Signed)
Pt being seen Monday by Dr. Caryl Comes for potential ERI of her device... pt has answered "yes" to some of her COVID screening questions... I have Left Message for the pt to call before 5pm to assess her symptoms prior to her entering the office 12/11/18.

## 2018-12-08 NOTE — Telephone Encounter (Signed)
New Message ° ° ° °Left message to confirm appt and answer covid questions  °

## 2018-12-08 NOTE — Telephone Encounter (Signed)
Follow up        COVID-19 Pre-Screening Questions:   In the past 7 to 10 days have you had a cough,  shortness of breath, headache, congestion, fever (100 or greater) body aches, chills, sore throat, or sudden loss of taste or sense of smell? Cough, Some SOB, Headache, body aches  Have you been around anyone with known Covid 19. NO  Have you been around anyone who is awaiting Covid 19 test results in the past 7 to 10 days? NO  Have you been around anyone who has been exposed to Covid 19, or has mentioned symptoms of Covid 19 within the past 7 to 10 days? NO  If you have any concerns/questions about symptoms patients report during screening (either on the phone or at [facility]). Contact the provider seeing the patient or DOD for further guidance.  If neither are available contact a member of the leadership team.

## 2018-12-11 ENCOUNTER — Other Ambulatory Visit: Payer: Self-pay

## 2018-12-11 ENCOUNTER — Ambulatory Visit (INDEPENDENT_AMBULATORY_CARE_PROVIDER_SITE_OTHER): Payer: Medicare Other | Admitting: Internal Medicine

## 2018-12-11 ENCOUNTER — Telehealth: Payer: Self-pay | Admitting: Internal Medicine

## 2018-12-11 ENCOUNTER — Encounter: Payer: Self-pay | Admitting: Internal Medicine

## 2018-12-11 VITALS — BP 122/80 | HR 72 | Ht 64.0 in | Wt 188.0 lb

## 2018-12-11 DIAGNOSIS — I10 Essential (primary) hypertension: Secondary | ICD-10-CM | POA: Diagnosis not present

## 2018-12-11 DIAGNOSIS — Z95810 Presence of automatic (implantable) cardiac defibrillator: Secondary | ICD-10-CM | POA: Diagnosis not present

## 2018-12-11 DIAGNOSIS — I447 Left bundle-branch block, unspecified: Secondary | ICD-10-CM

## 2018-12-11 DIAGNOSIS — I5022 Chronic systolic (congestive) heart failure: Secondary | ICD-10-CM | POA: Diagnosis not present

## 2018-12-11 DIAGNOSIS — I428 Other cardiomyopathies: Secondary | ICD-10-CM

## 2018-12-11 LAB — CUP PACEART INCLINIC DEVICE CHECK
Battery Remaining Longevity: 1 mo — CL
Battery Voltage: 2.65 V
Brady Statistic AP VP Percent: 0.02 %
Brady Statistic AP VS Percent: 0.06 %
Brady Statistic AS VP Percent: 98.58 %
Brady Statistic AS VS Percent: 1.33 %
Brady Statistic RA Percent Paced: 0.08 %
Brady Statistic RV Percent Paced: 5.92 %
Date Time Interrogation Session: 20200713103617
HighPow Impedance: 73 Ohm
Implantable Lead Implant Date: 20131211
Implantable Lead Implant Date: 20131211
Implantable Lead Implant Date: 20131211
Implantable Lead Location: 753858
Implantable Lead Location: 753859
Implantable Lead Location: 753860
Implantable Lead Model: 181
Implantable Lead Model: 4396
Implantable Lead Model: 5076
Implantable Lead Serial Number: 32342
Implantable Pulse Generator Implant Date: 20131211
Lead Channel Impedance Value: 1121 Ohm
Lead Channel Impedance Value: 399 Ohm
Lead Channel Impedance Value: 551 Ohm
Lead Channel Impedance Value: 646 Ohm
Lead Channel Impedance Value: 665 Ohm
Lead Channel Impedance Value: 665 Ohm
Lead Channel Pacing Threshold Amplitude: 0.625 V
Lead Channel Pacing Threshold Amplitude: 0.75 V
Lead Channel Pacing Threshold Amplitude: 2.375 V
Lead Channel Pacing Threshold Pulse Width: 0.4 ms
Lead Channel Pacing Threshold Pulse Width: 0.4 ms
Lead Channel Pacing Threshold Pulse Width: 1 ms
Lead Channel Sensing Intrinsic Amplitude: 12 mV
Lead Channel Sensing Intrinsic Amplitude: 2.875 mV
Lead Channel Sensing Intrinsic Amplitude: 3.375 mV
Lead Channel Sensing Intrinsic Amplitude: 9.625 mV
Lead Channel Setting Pacing Amplitude: 2 V
Lead Channel Setting Pacing Amplitude: 2.5 V
Lead Channel Setting Pacing Amplitude: 2.75 V
Lead Channel Setting Pacing Pulse Width: 0.4 ms
Lead Channel Setting Pacing Pulse Width: 1.5 ms
Lead Channel Setting Sensing Sensitivity: 0.45 mV

## 2018-12-11 NOTE — Telephone Encounter (Signed)
  Ms Finigan is calling to let Dr Olin Pia nurse know that she would like to have her procedure done on 12/22/18. Please call to discuss

## 2018-12-11 NOTE — Progress Notes (Signed)
Electrophysiology Office Note Date: 12/11/2018  ID:  Cynthia, Roth 17-Dec-1950, MRN [id number]  PCP: [name], MD Primary Cardiologist: No primary care provider on file. Electrophysiologist: None  CC: Routine ICD follow-up  Cynthia Roth is a 68 y.o. female seen today with Dr. Caryl Comes.  They present today for routine electrophysiology followup.  Since last being seen in our clinic, the patient reports doing well overall. She notes alarming from her ICD since "late may" confirmed ERI as of 11/03/2018 today.  She feels like she has been slightly more SOB since then, specifically with stairs.  She has gained 8-10 lbs in the setting of the pandemic. She does her ADLs without difficulty. She also thinks her change of symptoms may have been related to changing from Bidil to Imdur/Hydralazine due to travelling within [facility]. She denies chest pain,  palpitations, PND, orthopnea, nausea, vomiting, dizziness, syncope, edema, weight gain, or early satiety. She has not had ICD shocks.   Device History: MDT BiV-ICD implanted 04/2012 for NICM History of appropriate therapy: No History of AAD therapy: No   Past Medical History:  Diagnosis Date  . AICD (automatic cardioverter/defibrillator) present   . Allergy   . Biventricular ICD -Medtronic    DOI 12/13 - Medtronic Viva XR CRT-D defibrillator, serial #[id number] H.    . CHF (congestive heart failure) ([place])   . Chronic systolic heart failure ([place]) 12/02/2011  . Colon polyps    Dr Mar Daring [facility]  . Dyslipidemia 12/02/2011  . Gout    "very seldom" (05/10/2012)  . Hx of colonic polyps   . Hyperlipidemia   . Hypertension    "used to have this; now my BP is low" (05/10/2012)  . LBBB (left bundle branch block) 12/02/2011  . Nonischemic cardiomyopathy ([place])   . Pre-diabetes   . Prediabetes   . Presence of permanent cardiac pacemaker   . Tubular adenoma 1999   history of   Past Surgical History:  Procedure Laterality Date   . BI-VENTRICULAR IMPLANTABLE CARDIOVERTER DEFIBRILLATOR N/A 05/10/2012   Procedure: BI-VENTRICULAR IMPLANTABLE CARDIOVERTER DEFIBRILLATOR  (CRT-D);  Surgeon: Deboraha Sprang, MD;  Location: [facility] CATH LAB;  Service: Cardiovascular;  Laterality: N/A;  . CARDIAC CATHETERIZATION  12-03-11   selectie coronary angiography  . CARDIAC DEFIBRILLATOR PLACEMENT  05/10/2012   CRT-D implantation (05/10/2012)  . COLONOSCOPY WITH PROPOFOL N/A 03/17/2017   Procedure: COLONOSCOPY WITH PROPOFOL;  Surgeon: Juanita Craver, MD;  Location: [facility] ENDOSCOPY;  Service: Endoscopy;  Laterality: N/A;  . LEFT HEART CATHETERIZATION WITH CORONARY ANGIOGRAM N/A 12/03/2011   Procedure: LEFT HEART CATHETERIZATION WITH CORONARY ANGIOGRAM;  Surgeon: Larey Dresser, MD;  Location: [facility] CATH LAB;  Service: Cardiovascular;  Laterality: N/A;  . PARTIAL HYSTERECTOMY  1980's    Current Outpatient Medications  Medication Sig Dispense Refill  . aspirin 81 MG tablet Take 81 mg by mouth daily.    [name] atorvastatin (LIPITOR) 20 MG tablet Take 1 tablet (20 mg total) by mouth daily. 90 tablet 2  . B COMPLEX VITAMINS SL Place 1 drop under the tongue daily.    . carvedilol (COREG) 12.5 MG tablet TAKE 1 TABLET (12.5 MG TOTAL) BY MOUTH 2 (TWO) TIMES DAILY WITH A MEAL. 120 tablet 3  . Cholecalciferol (VITAMIN D3) 2000 units TABS Take 1 tablet by mouth daily.    . Cinnamon 500 MG capsule Take 1,000 mg by mouth daily.    . Cyanocobalamin (VITAMIN B-12 PO) Take 5,000 mcg by mouth daily.    [name]  ferrous sulfate 325 (65 FE) MG tablet TAKE 1 TABLET BY MOUTH EVERY DAY WITH BREAKFAST 90 tablet 3  . furosemide (LASIX) 20 MG tablet TAKE 1 TABLET BY MOUTH EVERY OTHER DAY 20 tablet 3  . isosorbide-hydrALAZINE (BIDIL) 20-37.5 MG tablet TAKE (1/2) TABLET THREE TIMES DAILY. 135 tablet 0  . lisinopril (ZESTRIL) 20 MG tablet Take 10 mg by mouth 2 (two) times a day.    . magnesium oxide (MAG-OX) 400 MG tablet TAKE 1 TABLET BY MOUTH EVERY DAY 90 tablet 3  . spironolactone  (ALDACTONE) 25 MG tablet TAKE 1 TABLET BY MOUTH EVERY DAY 30 tablet 2  . vitamin C (ASCORBIC ACID) 500 MG tablet Take 500 mg by mouth daily.     No current facility-administered medications for this visit.     Allergies:   Decongestant [pseudoephedrine hcl er]   Social History: Social History   Socioeconomic History  . Marital status: Divorced    Spouse name: n/a  . Number of children: 1  . Years of education: Master's  . Highest education level: Not on file  Occupational History  . Occupation: Conservator, museum/gallery  Social Needs  . Financial resource strain: Not on file  . Food insecurity    Worry: Not on file    Inability: Not on file  . Transportation needs    Medical: Not on file    Non-medical: Not on file  Tobacco Use  . Smoking status: Never Smoker  . Smokeless tobacco: Never Used  Substance and Sexual Activity  . Alcohol use: No    Alcohol/week: 0.0 standard drinks  . Drug use: No  . Sexual activity: Never  Lifestyle  . Physical activity    Days per week: Not on file    Minutes per session: Not on file  . Stress: Not on file  Relationships  . Social Herbalist on phone: Not on file    Gets together: Not on file    Attends religious service: Not on file    Active member of club or organization: Not on file    Attends meetings of clubs or organizations: Not on file    Relationship status: Not on file  . Intimate partner violence    Fear of current or ex partner: Not on file    Emotionally abused: Not on file    Physically abused: Not on file    Forced sexual activity: Not on file  Other Topics Concern  . Not on file  Social History Narrative   Lives in [place], and her mother, grandmother and one brother live with her. Divorced with one adult son who lives in [place], [place]. One brother lives in [place], [place]. Another lives in [place]. 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     Family History: Family History  Problem Relation Age of Onset  . Angina Mother        diagnosed at age of 3's, unsure whether it was a true diagnosis. unsure about the treatment  . Dementia Mother   . Sleep apnea Brother   . Kidney disease Father   . Diabetes Father   . Diabetes Brother   . Heart failure Maternal Grandmother        diagnosed in age of 35's. still living at age of 98's.  [name] Heart attack Maternal Grandmother   . Stroke Maternal Grandmother   . Breast cancer Maternal Grandmother   .  Colon cancer Maternal Grandmother   . Colon cancer Other        family history  . Intellectual disability Other   . Hypertension Neg Hx     Review of Systems: All other systems reviewed and are otherwise negative except as noted above.   Physical Exam: Vitals:   12/11/18 0857  BP: 122/80  Pulse: 72  SpO2: 99%  Weight: 188 lb (85.3 kg)  Height: 5\' 4"  (1.626 m)     GEN- The patient is well appearing, alert and oriented x 3 today.   HEENT: normocephalic, atraumatic; sclera clear, conjunctiva pink; hearing intact; oropharynx clear; neck supple, no JVP Lymph- no cervical lymphadenopathy Lungs- Clear to ausculation bilaterally, normal work of breathing.  No wheezes, rales, rhonchi Heart- Regular rate and rhythm, no murmurs, rubs or gallops, PMI not laterally displaced GI- soft, non-tender, non-distended, bowel sounds present, no hepatosplenomegaly Extremities- no clubbing, cyanosis, or edema; DP/PT/radial pulses 2+ bilaterally MS- no significant deformity or atrophy Skin- warm and dry, no rash or lesion; ICD pocket well healed Psych- euthymic mood, full affect Neuro- strength and sensation are intact  ICD interrogation- reviewed in detail today,  See PACEART report  EKG:  EKG is ordered today. The ekg ordered today shows A-sensed V paced at 70, QRS 163ms  Recent Labs: No results found for requested labs within last 8760 hours.   Wt Readings from Last 3 Encounters:   12/11/18 188 lb (85.3 kg)  09/14/18 187 lb 3.2 oz (84.9 kg)  04/04/18 190 lb (86.2 kg)     Other studies Reviewed: Additional studies/ records that were reviewed today include: Previous echo, EKGs, and device information.  Assessment and Plan:  1.  Chronic systolic dysfunction due to NICM - Echo 50-55% 12/2016 (Previously as low as 20%) euvolemic today Stable on an appropriate medical regimen Normal ICD function. Pt would like Dr. Caryl Comes to discuss with her son as to whether to pursue CRT-D vs CRT-P at gen change. See Pace Art report No changes today  2. Dyspnea - Slight. Will repeat echocardiogram and encouraged resumption of her normal activity. Noted down trend of activity on her device over the past several months. No recent optivol crossings since fluid improved in February.   Current medicines are reviewed at length with the patient today.   The patient does not have concerns regarding her medicines.  The following changes were made today:  none  Labs/ tests ordered today include: Echo Orders Placed This Encounter  Procedures  . EKG 12-Lead     Disposition:   Follow up with Dr. Caryl Comes 3 months s/p gen change.   Shirley Friar, PA-C  12/11/18  10:05 AM  [facility] [address] [place] [address] [phone] (office) [phone] (fax)

## 2018-12-11 NOTE — Patient Instructions (Signed)
Medication Instructions:  Your physician recommends that you continue on your current medications as directed. Please refer to the Current Medication list given to you today.  Labwork: None ordered.  Testing/Procedures: None ordered.  Follow-Up: Your physician recommends that you schedule a follow-up appointment in:   91 days after your battery change out.  Any Other Special Instructions Will Be Listed Below (If Applicable).  Here are a few dates available for your change out:  July 17 July 22 July 24 July27 Aug 10 Aug 19 Aug 31  Please call me when you are ready to schedule :)   If you need a refill on your cardiac medications before your next appointment, please call your pharmacy.

## 2018-12-11 NOTE — H&P (View-Only) (Signed)
Electrophysiology Office Note Date: 12/11/2018  ID:  Harlei, Lehrmann [birth year], MRN [id number]  PCP: [name], MD Primary Cardiologist: No primary care provider on file. Electrophysiologist: None  CC: Routine ICD follow-up  Cynthia Roth is a 68 y.o. female seen today with Dr. Caryl Comes.  They present today for routine electrophysiology followup.  Since last being seen in our clinic, the patient reports doing well overall. She notes alarming from her ICD since "late may" confirmed ERI as of 11/03/2018 today.  She feels like she has been slightly more SOB since then, specifically with stairs.  She has gained 8-10 lbs in the setting of the pandemic. She does her ADLs without difficulty. She also thinks her change of symptoms may have been related to changing from Bidil to Imdur/Hydralazine due to travelling within [facility]. She denies chest pain,  palpitations, PND, orthopnea, nausea, vomiting, dizziness, syncope, edema, weight gain, or early satiety. She has not had ICD shocks.   Device History: MDT BiV-ICD implanted 04/2012 for NICM History of appropriate therapy: No History of AAD therapy: No   Past Medical History:  Diagnosis Date  . AICD (automatic cardioverter/defibrillator) present   . Allergy   . Biventricular ICD -Medtronic    DOI 12/13 - Medtronic Viva XR CRT-D defibrillator, serial #BLF [id number] H.    . CHF (congestive heart failure) ([place])   . Chronic systolic heart failure ([place]) 12/02/2011  . Colon polyps    Dr Mar Daring [facility]  . Dyslipidemia 12/02/2011  . Gout    "very seldom" (05/10/2012)  . Hx of colonic polyps   . Hyperlipidemia   . Hypertension    "used to have this; now my BP is low" (05/10/2012)  . LBBB (left bundle branch block) 12/02/2011  . Nonischemic cardiomyopathy ([employer])   . Pre-diabetes   . Prediabetes   . Presence of permanent cardiac pacemaker   . Tubular adenoma 1999   history of   Past Surgical History:  Procedure Laterality Date   . BI-VENTRICULAR IMPLANTABLE CARDIOVERTER DEFIBRILLATOR N/A 05/10/2012   Procedure: BI-VENTRICULAR IMPLANTABLE CARDIOVERTER DEFIBRILLATOR  (CRT-D);  Surgeon: Deboraha Sprang, MD;  Location[facility] CATH LAB;  Service: Cardiovascular;  Laterality: N/A;  . CARDIAC CATHETERIZATION  12-03-11   selectie coronary angiography  . CARDIAC DEFIBRILLATOR PLACEMENT  05/10/2012   CRT-D implantation (05/10/2012)  . COLONOSCOPY WITH PROPOFOL N/A 03/17/2017   Procedure: COLONOSCOPY WITH PROPOFOL;  Surgeon: Juanita Craver, MD;  Location: [facility] ENDOSCOPY;  Service: Endoscopy;  Laterality: N/A;  . LEFT HEART CATHETERIZATION WITH CORONARY ANGIOGRAM N/A 12/03/2011   Procedure: LEFT HEART CATHETERIZATION WITH CORONARY ANGIOGRAM;  Surgeon: Larey Dresser, MD;  Location[facility] CATH LAB;  Service: Cardiovascular;  Laterality: N/A;  . PARTIAL HYSTERECTOMY  1980's    Current Outpatient Medications  Medication Sig Dispense Refill  . aspirin 81 MG tablet Take 81 mg by mouth daily.    [name] atorvastatin (LIPITOR) 20 MG tablet Take 1 tablet (20 mg total) by mouth daily. 90 tablet 2  . B COMPLEX VITAMINS SL Place 1 drop under the tongue daily.    . carvedilol (COREG) 12.5 MG tablet TAKE 1 TABLET (12.5 MG TOTAL) BY MOUTH 2 (TWO) TIMES DAILY WITH A MEAL. 120 tablet 3  . Cholecalciferol (VITAMIN D3) 2000 units TABS Take 1 tablet by mouth daily.    . Cinnamon 500 MG capsule Take 1,000 mg by mouth daily.    . Cyanocobalamin (VITAMIN B-12 PO) Take 5,000 mcg by mouth daily.    [name]  ferrous sulfate 325 (65 FE) MG tablet TAKE 1 TABLET BY MOUTH EVERY DAY WITH BREAKFAST 90 tablet 3  . furosemide (LASIX) 20 MG tablet TAKE 1 TABLET BY MOUTH EVERY OTHER DAY 20 tablet 3  . isosorbide-hydrALAZINE (BIDIL) 20-37.5 MG tablet TAKE (1/2) TABLET THREE TIMES DAILY. 135 tablet 0  . lisinopril (ZESTRIL) 20 MG tablet Take 10 mg by mouth 2 (two) times a day.    . magnesium oxide (MAG-OX) 400 MG tablet TAKE 1 TABLET BY MOUTH EVERY DAY 90 tablet 3  . spironolactone  (ALDACTONE) 25 MG tablet TAKE 1 TABLET BY MOUTH EVERY DAY 30 tablet 2  . vitamin C (ASCORBIC ACID) 500 MG tablet Take 500 mg by mouth daily.     No current facility-administered medications for this visit.     Allergies:   Decongestant [pseudoephedrine hcl er]   Social History: Social History   Socioeconomic History  . Marital status: Divorced    Spouse name: n/a  . Number of children: 1  . Years of education: Master's  . Highest education level: Not on file  Occupational History  . Occupation: Conservator, museum/gallery  Social Needs  . Financial resource strain: Not on file  . Food insecurity    Worry: Not on file    Inability: Not on file  . Transportation needs    Medical: Not on file    Non-medical: Not on file  Tobacco Use  . Smoking status: Never Smoker  . Smokeless tobacco: Never Used  Substance and Sexual Activity  . Alcohol use: No    Alcohol/week: 0.0 standard drinks  . Drug use: No  . Sexual activity: Never  Lifestyle  . Physical activity    Days per week: Not on file    Minutes per session: Not on file  . Stress: Not on file  Relationships  . Social Herbalist on phone: Not on file    Gets together: Not on file    Attends religious service: Not on file    Active member of club or organization: Not on file    Attends meetings of clubs or organizations: Not on file    Relationship status: Not on file  . Intimate partner violence    Fear of current or ex partner: Not on file    Emotionally abused: Not on file    Physically abused: Not on file    Forced sexual activity: Not on file  Other Topics Concern  . Not on file  Social History Narrative   Lives in [place], and her mother, grandmother and one brother live with her. Divorced with one adult son who lives in [place], [place]. One brother lives in [place], [place]. Another lives in [place]. 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     Family History: Family History  Problem Relation Age of Onset  . Angina Mother        diagnosed at age of 65's, unsure whether it was a true diagnosis. unsure about the treatment  . Dementia Mother   . Sleep apnea Brother   . Kidney disease Father   . Diabetes Father   . Diabetes Brother   . Heart failure Maternal Grandmother        diagnosed in age of 65's. still living at age of 73's.  [name] Heart attack Maternal Grandmother   . Stroke Maternal Grandmother   . Breast cancer Maternal Grandmother   .  Colon cancer Maternal Grandmother   . Colon cancer Other        family history  . Intellectual disability Other   . Hypertension Neg Hx     Review of Systems: All other systems reviewed and are otherwise negative except as noted above.   Physical Exam: Vitals:   12/11/18 0857  BP: 122/80  Pulse: 72  SpO2: 99%  Weight: 188 lb (85.3 kg)  Height: 5\' 4"  (1.626 m)     GEN- The patient is well appearing, alert and oriented x 3 today.   HEENT: normocephalic, atraumatic; sclera clear, conjunctiva pink; hearing intact; oropharynx clear; neck supple, no JVP Lymph- no cervical lymphadenopathy Lungs- Clear to ausculation bilaterally, normal work of breathing.  No wheezes, rales, rhonchi Heart- Regular rate and rhythm, no murmurs, rubs or gallops, PMI not laterally displaced GI- soft, non-tender, non-distended, bowel sounds present, no hepatosplenomegaly Extremities- no clubbing, cyanosis, or edema; DP/PT/radial pulses 2+ bilaterally MS- no significant deformity or atrophy Skin- warm and dry, no rash or lesion; ICD pocket well healed Psych- euthymic mood, full affect Neuro- strength and sensation are intact  ICD interrogation- reviewed in detail today,  See PACEART report  EKG:  EKG is ordered today. The ekg ordered today shows A-sensed V paced at 70, QRS 139ms  Recent Labs: No results found for requested labs within last 8760 hours.   Wt Readings from Last 3 Encounters:   12/11/18 188 lb (85.3 kg)  09/14/18 187 lb 3.2 oz (84.9 kg)  04/04/18 190 lb (86.2 kg)     Other studies Reviewed: Additional studies/ records that were reviewed today include: Previous echo, EKGs, and device information.  Assessment and Plan:  1.  Chronic systolic dysfunction due to NICM - Echo 50-55% 12/2016 (Previously as low as 20%) euvolemic today Stable on an appropriate medical regimen Normal ICD function. Pt would like Dr. Caryl Comes to discuss with her son as to whether to pursue CRT-D vs CRT-P at gen change. See Pace Art report No changes today  2. Dyspnea - Slight. Will repeat echocardiogram and encouraged resumption of her normal activity. Noted down trend of activity on her device over the past several months. No recent optivol crossings since fluid improved in February.   Current medicines are reviewed at length with the patient today.   The patient does not have concerns regarding her medicines.  The following changes were made today:  none  Labs/ tests ordered today include: Echo Orders Placed This Encounter  Procedures  . EKG 12-Lead     Disposition:   Follow up with Dr. Caryl Comes 3 months s/p gen change.   Shirley Friar, PA-C  12/11/18  10:05 AM  [facility] [address] [place] [address] [phone] (office) [phone] (fax)

## 2018-12-11 NOTE — Telephone Encounter (Signed)
New message      Patient son would like for the doctor to call him on his cell. patient states the doctor is going to call her sn today.

## 2018-12-12 NOTE — Telephone Encounter (Signed)
Spoke with pt regarding her implant, date, time, labs, and COVID screening. Pt would like for me to call her on 7/15 to review pre procedure instructions as she was driving. Pt is scheduled for gen change on 7/24.  Also spoke with son regarding PPM vs ICD gen change. He states he will discuss with his mother, but may need to speak with Dr. Caryl Comes regarding the difference as well. He will call with any additional questions.

## 2018-12-13 ENCOUNTER — Other Ambulatory Visit (HOSPITAL_COMMUNITY): Payer: Medicare Other

## 2018-12-13 NOTE — Telephone Encounter (Signed)
Pt would like to keep her ICD vs switching to PPM. Pre procedure instructions were reviewed and sent to pt's home via [place].   Pt did not have any additional questions.

## 2018-12-14 ENCOUNTER — Encounter: Payer: Medicare Other | Admitting: *Deleted

## 2018-12-15 ENCOUNTER — Telehealth: Payer: Self-pay

## 2018-12-15 NOTE — Telephone Encounter (Signed)
Left message for patient to remind of missed remote transmission.  

## 2018-12-15 NOTE — Telephone Encounter (Signed)
Pt called about her missed transmission. The pt is scheduled for a gen change next week. I told her not to worry about sending a transmission. Her monitor not working and they are sending her a new one. By the time she get a new one we will not be able to charge her until her 81 day follow up with Dr. Caryl Comes. The pt verbalized understanding.

## 2018-12-15 NOTE — Telephone Encounter (Signed)
Called son but did not get an answer  Will try again next week

## 2018-12-20 ENCOUNTER — Other Ambulatory Visit (HOSPITAL_COMMUNITY)
Admission: RE | Admit: 2018-12-20 | Discharge: 2018-12-20 | Disposition: A | Discharge status: Home / Self Care | Payer: Medicare Other | Source: Ambulatory Visit | Attending: Internal Medicine | Admitting: Internal Medicine

## 2018-12-20 ENCOUNTER — Telehealth: Payer: Self-pay | Admitting: Internal Medicine

## 2018-12-20 ENCOUNTER — Other Ambulatory Visit: Payer: Medicare Other | Admitting: *Deleted

## 2018-12-20 ENCOUNTER — Other Ambulatory Visit: Payer: Self-pay

## 2018-12-20 DIAGNOSIS — I428 Other cardiomyopathies: Secondary | ICD-10-CM

## 2018-12-20 DIAGNOSIS — Z1159 Encounter for screening for other viral diseases: Secondary | ICD-10-CM | POA: Insufficient documentation

## 2018-12-20 DIAGNOSIS — I447 Left bundle-branch block, unspecified: Secondary | ICD-10-CM

## 2018-12-20 LAB — CBC
Hematocrit: 29.2 % — ABNORMAL LOW (ref 34.0–46.6)
Hemoglobin: 9.9 g/dL — ABNORMAL LOW (ref 11.1–15.9)
MCH: 33 pg (ref 26.6–33.0)
MCHC: 33.9 g/dL (ref 31.5–35.7)
MCV: 97 fL (ref 79–97)
Platelets: 265 10*3/uL (ref 150–450)
RBC: 3 x10E6/uL — ABNORMAL LOW (ref 3.77–5.28)
RDW: 12.6 % (ref 11.7–15.4)
WBC: 5.5 10*3/uL (ref 3.4–10.8)

## 2018-12-20 LAB — SARS CORONAVIRUS 2 (TAT 6-24 HRS): SARS Coronavirus 2: NEGATIVE

## 2018-12-20 LAB — BASIC METABOLIC PANEL
BUN/Creatinine Ratio: 16 (ref 12–28)
BUN: 23 mg/dL (ref 8–27)
CO2: 21 mmol/L (ref 20–29)
Calcium: 10 mg/dL (ref 8.7–10.3)
Chloride: 106 mmol/L (ref 96–106)
Creatinine, Ser: 1.4 mg/dL — ABNORMAL HIGH (ref 0.57–1.00)
GFR calc Af Amer: 45 mL/min/{1.73_m2} — ABNORMAL LOW (ref >59)
GFR calc non Af Amer: 39 mL/min/{1.73_m2} — ABNORMAL LOW (ref >59)
Glucose: 119 mg/dL — ABNORMAL HIGH (ref 65–99)
Potassium: 5.1 mmol/L (ref 3.5–5.2)
Sodium: 142 mmol/L (ref 134–144)

## 2018-12-20 NOTE — Telephone Encounter (Signed)
Pt wanted to be sure she was scheduled for an ICD gen change, not a PPM downgrade. I confirmed with pt she is scheduled for an ICD change out. She thanked me for the call and had no additional questions.

## 2018-12-20 NOTE — Telephone Encounter (Signed)
Pt. Says that she is having a procedure on Friday and she wants to keep the ICD and she does not want to switch to the pacemaker. She would like for the nurse to call her as soon as she can before Friday.

## 2018-12-21 ENCOUNTER — Other Ambulatory Visit (HOSPITAL_COMMUNITY): Payer: Self-pay | Admitting: Cardiology

## 2018-12-22 ENCOUNTER — Ambulatory Visit (HOSPITAL_COMMUNITY)
Admission: RE | Disposition: A | Discharge status: Home / Self Care | Payer: Medicare Other | Source: Home / Self Care | Attending: Internal Medicine

## 2018-12-22 ENCOUNTER — Other Ambulatory Visit: Payer: Self-pay

## 2018-12-22 ENCOUNTER — Ambulatory Visit (HOSPITAL_COMMUNITY)
Admission: RE | Admit: 2018-12-22 | Discharge: 2018-12-22 | Disposition: A | Discharge status: Home / Self Care | Payer: Medicare Other | Attending: Internal Medicine | Admitting: Internal Medicine

## 2018-12-22 DIAGNOSIS — Z006 Encounter for examination for normal comparison and control in clinical research program: Secondary | ICD-10-CM | POA: Insufficient documentation

## 2018-12-22 DIAGNOSIS — I447 Left bundle-branch block, unspecified: Secondary | ICD-10-CM | POA: Diagnosis not present

## 2018-12-22 DIAGNOSIS — Z4502 Encounter for adjustment and management of automatic implantable cardiac defibrillator: Secondary | ICD-10-CM | POA: Insufficient documentation

## 2018-12-22 DIAGNOSIS — I5022 Chronic systolic (congestive) heart failure: Secondary | ICD-10-CM | POA: Diagnosis not present

## 2018-12-22 DIAGNOSIS — I110 Hypertensive heart disease with heart failure: Secondary | ICD-10-CM | POA: Diagnosis not present

## 2018-12-22 DIAGNOSIS — Z7982 Long term (current) use of aspirin: Secondary | ICD-10-CM | POA: Diagnosis not present

## 2018-12-22 DIAGNOSIS — Z79899 Other long term (current) drug therapy: Secondary | ICD-10-CM | POA: Insufficient documentation

## 2018-12-22 DIAGNOSIS — E785 Hyperlipidemia, unspecified: Secondary | ICD-10-CM | POA: Insufficient documentation

## 2018-12-22 DIAGNOSIS — R7303 Prediabetes: Secondary | ICD-10-CM | POA: Diagnosis not present

## 2018-12-22 DIAGNOSIS — I428 Other cardiomyopathies: Secondary | ICD-10-CM | POA: Diagnosis not present

## 2018-12-22 DIAGNOSIS — R0600 Dyspnea, unspecified: Secondary | ICD-10-CM | POA: Insufficient documentation

## 2018-12-22 DIAGNOSIS — Z8249 Family history of ischemic heart disease and other diseases of the circulatory system: Secondary | ICD-10-CM | POA: Diagnosis not present

## 2018-12-22 HISTORY — PX: BIV ICD GENERATOR CHANGEOUT: EP1194

## 2018-12-22 LAB — SURGICAL PCR SCREEN
MRSA, PCR: NEGATIVE
Staphylococcus aureus: NEGATIVE

## 2018-12-22 SURGERY — BIV ICD GENERATOR CHANGEOUT

## 2018-12-22 MED ORDER — CEFAZOLIN SODIUM-DEXTROSE 2-4 GM/100ML-% IV SOLN
2.0000 g | INTRAVENOUS | Status: AC
  Administered 2018-12-22: 2 g via INTRAVENOUS

## 2018-12-22 MED ORDER — SODIUM CHLORIDE 0.9 % IV SOLN
INTRAVENOUS | Status: DC
  Administered 2018-12-22: 11:00:00 via INTRAVENOUS

## 2018-12-22 MED ORDER — MUPIROCIN 2 % EX OINT
TOPICAL_OINTMENT | CUTANEOUS | Status: AC
  Administered 2018-12-22: 11:00:00
  Filled 2018-12-22: qty 22

## 2018-12-22 MED ORDER — ACETAMINOPHEN 325 MG PO TABS: 325.0000 mg | ORAL_TABLET | ORAL | Status: DC | PRN

## 2018-12-22 MED ORDER — FENTANYL CITRATE (PF) 100 MCG/2ML IJ SOLN
INTRAMUSCULAR | Status: AC
  Filled 2018-12-22: qty 2

## 2018-12-22 MED ORDER — FENTANYL CITRATE (PF) 100 MCG/2ML IJ SOLN
INTRAMUSCULAR | Status: DC | PRN
  Administered 2018-12-22 (×2): 12.5 ug via INTRAVENOUS

## 2018-12-22 MED ORDER — LIDOCAINE HCL 1 % IJ SOLN
INTRAMUSCULAR | Status: AC
  Filled 2018-12-22: qty 20

## 2018-12-22 MED ORDER — CEFAZOLIN SODIUM-DEXTROSE 2-4 GM/100ML-% IV SOLN
INTRAVENOUS | Status: AC
  Filled 2018-12-22: qty 100

## 2018-12-22 MED ORDER — SODIUM CHLORIDE 0.9 % IV SOLN
80.0000 mg | INTRAVENOUS | Status: AC
  Administered 2018-12-22: 80 mg

## 2018-12-22 MED ORDER — LIDOCAINE HCL (PF) 1 % IJ SOLN
INTRAMUSCULAR | Status: DC | PRN
  Administered 2018-12-22: 45 mL

## 2018-12-22 MED ORDER — CHLORHEXIDINE GLUCONATE 4 % EX LIQD
60.0000 mL | Freq: Once | CUTANEOUS | Status: DC
  Filled 2018-12-22: qty 60

## 2018-12-22 MED ORDER — MIDAZOLAM HCL 5 MG/5ML IJ SOLN
INTRAMUSCULAR | Status: DC | PRN
  Administered 2018-12-22 (×4): 1 mg via INTRAVENOUS

## 2018-12-22 MED ORDER — ONDANSETRON HCL 4 MG/2ML IJ SOLN: 4.0000 mg | Freq: Four times a day (QID) | INTRAMUSCULAR | Status: DC | PRN

## 2018-12-22 MED ORDER — SODIUM CHLORIDE 0.9 % IV SOLN
INTRAVENOUS | Status: AC
  Filled 2018-12-22: qty 2

## 2018-12-22 MED ORDER — MIDAZOLAM HCL 5 MG/5ML IJ SOLN
INTRAMUSCULAR | Status: AC
  Filled 2018-12-22: qty 5

## 2018-12-22 SURGICAL SUPPLY — 5 items
CABLE SURGICAL S-101-97-12 (CABLE) ×2 IMPLANT
HEMOSTAT SURGICEL 2X4 FIBR (HEMOSTASIS) ×1 IMPLANT
ICD COBALT CRTD DTPB2D1 (ICD Generator) ×1 IMPLANT
PAD PRO RADIOLUCENT 2001M-C (PAD) ×2 IMPLANT
TRAY PACEMAKER INSERTION (PACKS) ×2 IMPLANT

## 2018-12-22 NOTE — Interval H&P Note (Signed)
ICD Criteria  Current LVEF:50-55 . Within 12 months prior to implant: No   Heart failure history: Yes, Class II  Cardiomyopathy history: Yes, Non-Ischemic Cardiomyopathy.  Atrial Fibrillation/Atrial Flutter: No.  Ventricular tachycardia history: No.  Cardiac arrest history: No.  History of syndromes with risk of sudden death: No.  Previous ICD: Yes, Reason for ICD:  Primary prevention.  Current ICD indication: Primary  PPM indication: No.  Class I or II Bradycardia indication present: No  Beta Blocker therapy for 3 or more months: Yes, prescribed.   Ace Inhibitor/ARB therapy for 3 or more months: Yes, prescribed.    I have seen Cynthia Roth is a 68 y.o. female   for consideration of ICD implant for primary prevention of sudden death.  The patient's chart has been reviewed and they meet criteria for ICD implant.  I have had a thorough discussion with the patient reviewing options.  The patient and their family (if available) have had opportunities to ask questions and have them answered. The patient and I have decided together through the [place] [employer] to reimplant ICD at this time.  Risks, benefits, alternatives to ICD implantation were discussed in detail with the patient today. The patient  understands that the risks include but are not limited to bleeding, infection, pneumothorax, perforation, tamponade, vascular damage, renal failure, MI, stroke, death, inappropriate shocks, and lead dislodgement and   wishes to proceed.  History and Physical Interval Note:  We had lengthy discussions using the [facility] regarding ICD reimplantation in the setting of recovered LV function; with replacement of CRT, decided in favor of ICD- CRT  And not P- CRT  12/22/2018 10:57 AM  Cynthia Roth  has presented today for surgery, with the diagnosis of eri.  The various methods of treatment have been discussed with the patient and family. After  consideration of risks, benefits and other options for treatment, the patient has consented to  Procedure(s): ICD [place] (N/A) as a surgical intervention.  The patient's history has been reviewed, patient examined, no change in status, stable for surgery.  I have reviewed the patient's chart and labs.  Questions were answered to the patient's satisfaction.     Cynthia Roth

## 2018-12-22 NOTE — Discharge Instructions (Signed)
Post procedure care instructions °Keep incision clean and dry for 10 days. °No driving for 2 days.  °You can remove outer dressing tomorrow. °Leave steri-strips (little pieces of tape) on until seen in the office for wound check appointment. °Call the office ([phone]) for redness, drainage, swelling, or fever. ° ° °Pacemaker Battery Change, Care After °This sheet gives you information about how to care for yourself after your procedure. Your health care provider may also give you more specific instructions. If you have problems or questions, contact your health care provider. °What can I expect after the procedure? °After your procedure, it is common to have: °· Pain or soreness at the site where the pacemaker was inserted. °· Swelling at the site where the pacemaker was inserted. °Follow these instructions at home: °Incision care °· Keep the incision clean and dry. °? Do not take baths, swim, or use a hot tub until your health care provider approves. °? You may shower the day after your procedure, or as directed by your health care provider. °? Pat the area dry with a clean towel. Do not rub the area. This may cause bleeding. °· Follow instructions from your health care provider about how to take care of your incision. Make sure you: °? Wash your hands with soap and water before you change your bandage (dressing). If soap and water are not available, use hand sanitizer. °? Change your dressing as told by your health care provider. °? Leave stitches (sutures), skin glue, or adhesive strips in place. These skin closures may need to stay in place for 2 weeks or longer. If adhesive strip edges start to loosen and curl up, you may trim the loose edges. Do not remove adhesive strips completely unless your health care provider tells you to do that. °· Check your incision area every day for signs of infection. Check for: °? More redness, swelling, or pain. °? More fluid or blood. °? Warmth. °? Pus or a bad  smell. °Activity °· Do not lift anything that is heavier than 10 lb (4.5 kg) until your health care provider says it is okay to do so. °· For the first 2 weeks, or as long as told by your health care provider: °? Avoid lifting your left arm higher than your shoulder. °? Be gentle when you move your arms over your head. It is okay to raise your arm to comb your hair. °? Avoid strenuous exercise. °· Ask your health care provider when it is okay to: °? Resume your normal activities. °? Return to work or school. °? Resume sexual activity. °Eating and drinking °· Eat a heart-healthy diet. This should include plenty of fresh fruits and vegetables, whole grains, low-fat dairy products, and lean protein like chicken and fish. °· Limit alcohol intake to no more than 1 drink a day for non-pregnant women and 2 drinks a day for men. One drink equals 12 oz of beer, 5 oz of wine, or 1½ oz of hard liquor. °· Check ingredients and nutrition facts on packaged foods and beverages. Avoid the following types of food: °? Food that is high in salt (sodium). °? Food that is high in saturated fat, like full-fat dairy or red meat. °? Food that is high in trans fat, like fried food. °? Food and drinks that are high in sugar. °Lifestyle °· Do not use any products that contain nicotine or tobacco, such as cigarettes and e-cigarettes. If you need help quitting, ask your health care provider. °· Take   steps to manage and control your weight. °· Get regular exercise. Aim for 150 minutes of moderate-intensity exercise (such as walking or yoga) or 75 minutes of vigorous exercise (such as running or swimming) each week. °· Manage other health problems, such as diabetes or high blood pressure. Ask your health care provider how you can manage these conditions. °General instructions °· Do not drive for 24 hours after your procedure if you were given a medicine to help you relax (sedative). °· Take over-the-counter and prescription medicines only as told  by your health care provider. °· Avoid putting pressure on the area where the pacemaker was placed. °· If you need an MRI after your pacemaker has been placed, be sure to tell the health care provider who orders the MRI that you have a pacemaker. °· Avoid close and prolonged exposure to electrical devices that have strong magnetic fields. These include: °? Cell phones. Avoid keeping them in a pocket near the pacemaker, and try using the ear opposite the pacemaker. °? MP3 players. °? Household appliances, like microwaves. °? Metal detectors. °? Electric generators. °? High-tension wires. °· Keep all follow-up visits as directed by your health care provider. This is important. °Contact a health care provider if: °· You have pain at the incision site that is not relieved by over-the-counter or prescription medicines. °· You have any of these around your incision site or coming from it: °? More redness, swelling, or pain. °? Fluid or blood. °? Warmth to the touch. °? Pus or a bad smell. °· You have a fever. °· You feel brief, occasional palpitations, light-headedness, or any symptoms that you think might be related to your heart. °Get help right away if: °· You experience chest pain that is different from the pain at the pacemaker site. °· You develop a red streak that extends above or below the incision site. °· You experience shortness of breath. °· You have palpitations or an irregular heartbeat. °· You have light-headedness that does not go away quickly. °· You faint or have dizzy spells. °· Your pulse suddenly drops or increases rapidly and does not return to normal. °· You begin to gain weight and your legs and ankles swell. °Summary °· After your procedure, it is common to have pain, soreness, and some swelling where the pacemaker was inserted. °· Make sure to keep your incision clean and dry. Follow instructions from your health care provider about how to take care of your incision. °· Check your incision every  day for signs of infection, such as more pain or swelling, pus or a bad smell, warmth, or leaking fluid and blood. °· Avoid strenuous exercise and lifting your left arm higher than your shoulder for 2 weeks, or as long as told by your health care provider. °This information is not intended to replace advice given to you by your health care provider. Make sure you discuss any questions you have with your health care provider. °Document Released: 03/07/2013 Document Revised: 04/29/2017 Document Reviewed: 04/08/2016 °Elsevier Patient Education © 2020 [employer] ° °

## 2018-12-25 ENCOUNTER — Encounter (HOSPITAL_COMMUNITY): Payer: Self-pay | Admitting: Internal Medicine

## 2018-12-25 MED FILL — Lidocaine HCl Local Inj 1%: INTRAMUSCULAR | Qty: 60 | Status: AC

## 2018-12-28 ENCOUNTER — Ambulatory Visit: Payer: Medicare Other

## 2019-01-03 ENCOUNTER — Telehealth: Payer: Self-pay

## 2019-01-03 NOTE — Telephone Encounter (Signed)
    COVID-19 Pre-Screening Questions:  . In the past 7 to 10 days have you had a cough,  shortness of breath, headache, congestion, fever (100 or greater) body aches, chills, sore throat, or sudden loss of taste or sense of smell? No . Have you been around anyone with known Covid 19. No . Have you been around anyone who is awaiting Covid 19 test results in the past 7 to 10 days? No . Have you been around anyone who has been exposed to Covid 19, or has mentioned symptoms of Covid 19 within the past 7 to 10 days? No  If you have any concerns/questions about symptoms patients report during screening (either on the phone or at threshold). Contact the provider seeing the patient or DOD for further guidance.  If neither are available contact a member of the leadership team.        Pt answered No to all covid-19 prescreening questions. I asked the pt to wear a mask if she has one. I let her know that we are reducing the number of people in the office and if she can physically walk into the appointment to please do so. The pt verbalized understanding.

## 2019-01-04 ENCOUNTER — Ambulatory Visit (INDEPENDENT_AMBULATORY_CARE_PROVIDER_SITE_OTHER): Payer: Medicare Other | Admitting: *Deleted

## 2019-01-04 ENCOUNTER — Other Ambulatory Visit: Payer: Self-pay

## 2019-01-04 DIAGNOSIS — I5022 Chronic systolic (congestive) heart failure: Secondary | ICD-10-CM

## 2019-01-04 LAB — CUP PACEART INCLINIC DEVICE CHECK
Battery Remaining Longevity: 1 mo — CL
Battery Voltage: 2.65 V
Brady Statistic AP VP Percent: 0.02 %
Brady Statistic AP VS Percent: 0.06 %
Brady Statistic AS VP Percent: 98.58 %
Brady Statistic AS VS Percent: 1.33 %
Brady Statistic RA Percent Paced: 0.08 %
Brady Statistic RV Percent Paced: 5.92 %
Date Time Interrogation Session: 20200713133953
HighPow Impedance: 73 Ohm
Implantable Lead Implant Date: 20131211
Implantable Lead Implant Date: 20131211
Implantable Lead Implant Date: 20131211
Implantable Lead Location: 753858
Implantable Lead Location: 753859
Implantable Lead Location: 753860
Implantable Lead Model: 181
Implantable Lead Model: 4396
Implantable Lead Model: 5076
Implantable Lead Serial Number: 32342
Implantable Pulse Generator Implant Date: 20131211
Lead Channel Impedance Value: 1121 Ohm
Lead Channel Impedance Value: 399 Ohm
Lead Channel Impedance Value: 551 Ohm
Lead Channel Impedance Value: 646 Ohm
Lead Channel Impedance Value: 665 Ohm
Lead Channel Impedance Value: 665 Ohm
Lead Channel Pacing Threshold Amplitude: 0.625 V
Lead Channel Pacing Threshold Amplitude: 0.75 V
Lead Channel Pacing Threshold Amplitude: 2.375 V
Lead Channel Pacing Threshold Pulse Width: 0.4 ms
Lead Channel Pacing Threshold Pulse Width: 0.4 ms
Lead Channel Pacing Threshold Pulse Width: 1 ms
Lead Channel Sensing Intrinsic Amplitude: 12 mV
Lead Channel Sensing Intrinsic Amplitude: 2.875 mV
Lead Channel Sensing Intrinsic Amplitude: 3.375 mV
Lead Channel Sensing Intrinsic Amplitude: 9.625 mV
Lead Channel Setting Pacing Amplitude: 2 V
Lead Channel Setting Pacing Amplitude: 2.5 V
Lead Channel Setting Pacing Amplitude: 2.75 V
Lead Channel Setting Pacing Pulse Width: 0.4 ms
Lead Channel Setting Pacing Pulse Width: 1.5 ms
Lead Channel Setting Sensing Sensitivity: 0.45 mV

## 2019-01-04 NOTE — Progress Notes (Signed)
Wound check appointment. Dermabond removed. Wound without redness or edema. Incision edges approximated, wound well healed. Normal device function. Thresholds, sensing, and impedances consistent with implant measurements. LV threshold measured 2.25V @ 1.1ms. Increased LV amplitude to 2.75V per industry. Device programmed at chronic outputs. Histogram distribution appropriate for patient and level of activity. No mode switches or ventricular arrhythmias noted. Increased max impedance ranges to 2,000ohms. Patient educated about wound care, arm mobility, lifting restrictions, shock plan. ROV w/ SK 03/26/19.

## 2019-01-06 ENCOUNTER — Other Ambulatory Visit (HOSPITAL_COMMUNITY): Payer: Self-pay | Admitting: Cardiology

## 2019-01-08 ENCOUNTER — Other Ambulatory Visit: Payer: Self-pay | Admitting: Cardiology

## 2019-01-12 ENCOUNTER — Telehealth: Payer: Self-pay

## 2019-01-12 NOTE — Telephone Encounter (Signed)
Pt called stating that her monitor is not working. The pt called [employer] and they couldn't do much to help her because she did not know her serial number. I gave her the device serial number and advised her to call [employer] to get additional help with her home monitor.

## 2019-02-01 ENCOUNTER — Telehealth: Payer: Self-pay | Admitting: Internal Medicine

## 2019-02-01 NOTE — Telephone Encounter (Signed)
° ° °  Pt c/o medication issue:  1. Name of Medication:  isosorbide-hydrALAZINE (BIDIL) 20-37.5 MG tablet isosorbide dinitrate (ISORDIL) 20 MG tablet  2. How are you currently taking this medication (dosage and times per day)? n/a  3. Are you having a reaction (difficulty breathing--STAT)? no  4. What is your medication issue? Patient calling to confirm which medication she needs to continue

## 2019-02-02 MED ORDER — BIDIL 20-37.5 MG PO TABS: ORAL_TABLET | ORAL | 3 refills | Status: DC

## 2019-02-02 NOTE — Telephone Encounter (Signed)
Pt calling today to clarify her medication list. She traveled out of town recently and ran out of her Bidil. She was able to get Imdur in the interim from an outside provider. She would like to know which medication she should be taking. I advised her neither Dr. Caryl Comes nor Dr. Oval Linsey had taken her off the Bidil. I advised her to continue the Bidil unless otherwise directed. I sent in a new script for her.  She had no additional questions.

## 2019-02-22 ENCOUNTER — Ambulatory Visit (INDEPENDENT_AMBULATORY_CARE_PROVIDER_SITE_OTHER): Payer: Medicare Other | Admitting: Family Medicine

## 2019-02-22 ENCOUNTER — Encounter: Payer: Self-pay | Admitting: Family Medicine

## 2019-02-22 ENCOUNTER — Other Ambulatory Visit: Payer: Self-pay

## 2019-02-22 VITALS — BP 96/60 | HR 70 | Temp 98.4°F | Wt 194.0 lb

## 2019-02-22 DIAGNOSIS — Z23 Encounter for immunization: Secondary | ICD-10-CM

## 2019-02-22 DIAGNOSIS — I5022 Chronic systolic (congestive) heart failure: Secondary | ICD-10-CM

## 2019-02-22 DIAGNOSIS — D649 Anemia, unspecified: Secondary | ICD-10-CM | POA: Diagnosis not present

## 2019-02-22 DIAGNOSIS — R7303 Prediabetes: Secondary | ICD-10-CM

## 2019-02-22 DIAGNOSIS — E782 Mixed hyperlipidemia: Secondary | ICD-10-CM

## 2019-02-22 DIAGNOSIS — I10 Essential (primary) hypertension: Secondary | ICD-10-CM

## 2019-02-22 DIAGNOSIS — Z0000 Encounter for general adult medical examination without abnormal findings: Secondary | ICD-10-CM

## 2019-02-22 LAB — CBC
HCT: 30.5 % — ABNORMAL LOW (ref 36.0–46.0)
Hemoglobin: 10.4 g/dL — ABNORMAL LOW (ref 12.0–15.0)
MCHC: 34.2 g/dL (ref 30.0–36.0)
MCV: 97.9 fl (ref 78.0–100.0)
Platelets: 278 10*3/uL (ref 150.0–400.0)
RBC: 3.11 Mil/uL — ABNORMAL LOW (ref 3.87–5.11)
RDW: 12.8 % (ref 11.5–15.5)
WBC: 5.3 10*3/uL (ref 4.0–10.5)

## 2019-02-22 LAB — LIPID PANEL
Cholesterol: 127 mg/dL (ref 0–200)
HDL: 38.6 mg/dL — ABNORMAL LOW (ref >39.00)
LDL Cholesterol: 77 mg/dL (ref 0–99)
NonHDL: 88.64
Total CHOL/HDL Ratio: 3
Triglycerides: 60 mg/dL (ref 0.0–149.0)
VLDL: 12 mg/dL (ref 0.0–40.0)

## 2019-02-22 LAB — HEMOGLOBIN A1C: Hgb A1c MFr Bld: 6.2 % (ref 4.6–6.5)

## 2019-02-22 NOTE — Progress Notes (Signed)
Subjective:     Cynthia Roth is a 68 y.o. female and is here for a comprehensive physical exam. The patient reports no problems.  Pt following with Cardiology for systolic CHF.  Denies changes in weight, SOB, or LE edema.  Pt states was having difficulty sleeping, but started using Badger sleep balm on her face which helps.  HAs lavender in it.  Has mammogram scheduled for October.  Colonoscopy 03/17/2017 Social History   Socioeconomic History  . Marital status: Divorced    Spouse name: n/a  . Number of children: 1  . Years of education: Master's  . Highest education level: Not on file  Occupational History  . Occupation: Conservator, museum/gallery  Social Needs  . Financial resource strain: Not on file  . Food insecurity    Worry: Not on file    Inability: Not on file  . Transportation needs    Medical: Not on file    Non-medical: Not on file  Tobacco Use  . Smoking status: Never Smoker  . Smokeless tobacco: Never Used  Substance and Sexual Activity  . Alcohol use: No    Alcohol/week: 0.0 standard drinks  . Drug use: No  . Sexual activity: Never  Lifestyle  . Physical activity    Days per week: Not on file    Minutes per session: Not on file  . Stress: Not on file  Relationships  . Social Herbalist on phone: Not on file    Gets together: Not on file    Attends religious service: Not on file    Active member of club or organization: Not on file    Attends meetings of clubs or organizations: Not on file    Relationship status: Not on file  . Intimate partner violence    Fear of current or ex partner: Not on file    Emotionally abused: Not on file    Physically abused: Not on file    Forced sexual activity: Not on file  Other Topics Concern  . Not on file  Social History Narrative   Lives in [place], and her mother, grandmother and one brother live with her. Divorced with one adult son who lives in  [place], [place]. One brother lives in [place], [place]. Another lives in [place]. 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   Health Maintenance  Topic Date Due  . Hepatitis C Screening  July 09, 1950  . TETANUS/TDAP  10/23/1970  . DEXA SCAN  10/22/2016  . PNA vac Low Risk Adult (1 of 2 - PCV13) 10/22/2016  . INFLUENZA VACCINE  12/30/2018  . MAMMOGRAM  03/27/2020  . COLONOSCOPY  03/18/2027    The following portions of the patient's history were reviewed and updated as appropriate: allergies, current medications, past family history, past medical history, past social history, past surgical history and problem list.  Review of Systems A comprehensive review of systems was negative.   Objective:    BP 96/60 (BP Location: Left Arm, Patient Position: Sitting, Cuff Size: Normal)   Pulse 70   Temp 98.4 F (36.9 C)   Wt 194 lb (88 kg)   SpO2 98%   BMI 32.79 kg/m  General appearance: alert, cooperative and no distress Head: Normocephalic, without obvious abnormality, atraumatic Eyes: conjunctivae/corneas clear. PERRL, EOM's intact. Fundi benign. Ears: normal TM's and external ear canals both ears Nose: Nares normal. Septum midline. Mucosa normal. No drainage or  sinus tenderness. Throat: lips, mucosa, and tongue normal; teeth and gums normal Neck: no adenopathy, no carotid bruit, no JVD, supple, symmetrical, trachea midline and thyroid not enlarged, symmetric, no tenderness/mass/nodules Lungs: clear to auscultation bilaterally Heart: regular rate and rhythm, S1, S2 normal, no murmur, click, rub or gallop Abdomen: soft, non-tender; bowel sounds normal; no masses,  no organomegaly Extremities: extremities normal, atraumatic, no cyanosis or edema Pulses: 2+ and symmetric Skin: Skin color, texture, turgor normal. No rashes or lesions Lymph nodes: Cervical, supraclavicular, and axillary nodes normal. Neurologic: Alert and oriented X 3, normal strength and tone. Normal symmetric  reflexes. Normal coordination and gait    Assessment:    Healthy female exam.      Plan:     Anticipatory guidance given including wearing seatbelts, smoke detectors in the home, increasing physical activity, increasing p.o. intake of water and vegetables. -will obtain labs -influenza vaccine offered and given this visit. -given handout -next CPE in 1 yr See After Visit Summary for Counseling Recommendations    HTN -controlled -continue coreg 12.5 mg BID, lasix, bidi 20-37.5 mg, lisinopril 20, spironolactone 25 mg -continue f/u with Cards  CHF -stable -continue current meds as listed above -continue daily weights, limit sodium intake -continue f/u with Cards  HLD -continue lipitor 20 mg -continue lifestyle modifications -will obtain lipid panel as pt is fasting  Anemia -CBC  PreDM -will obtain Hgb A1C -continue lifestyle modifications  F/u prn  Grier Mitts, MD

## 2019-02-22 NOTE — Patient Instructions (Addendum)
Preventive Care 38 Years and Older, Female Preventive care refers to lifestyle choices and visits with your health care provider that can promote health and wellness. This includes:  A yearly physical exam. This is also called an annual well check.  Regular dental and eye exams.  Immunizations.  Screening for certain conditions.  Healthy lifestyle choices, such as diet and exercise. What can I expect for my preventive care visit? Physical exam Your health care provider will check:  Height and weight. These may be used to calculate body mass index (BMI), which is a measurement that tells if you are at a healthy weight.  Heart rate and blood pressure.  Your skin for abnormal spots. Counseling Your health care provider may ask you questions about:  Alcohol, tobacco, and drug use.  Emotional well-being.  Home and relationship well-being.  Sexual activity.  Eating habits.  History of falls.  Memory and ability to understand (cognition).  Work and work Statistician.  Pregnancy and menstrual history. What immunizations do I need?  Influenza (flu) vaccine  This is recommended every year. Tetanus, diphtheria, and pertussis (Tdap) vaccine  You may need a Td booster every 10 years. Varicella (chickenpox) vaccine  You may need this vaccine if you have not already been vaccinated. Zoster (shingles) vaccine  You may need this after age 33. Pneumococcal conjugate (PCV13) vaccine  One dose is recommended after age 33. Pneumococcal polysaccharide (PPSV23) vaccine  One dose is recommended after age 72. Measles, mumps, and rubella (MMR) vaccine  You may need at least one dose of MMR if you were born in 1957 or later. You may also need a second dose. Meningococcal conjugate (MenACWY) vaccine  You may need this if you have certain conditions. Hepatitis A vaccine  You may need this if you have certain conditions or if you travel or work in places where you may be exposed  to hepatitis A. Hepatitis B vaccine  You may need this if you have certain conditions or if you travel or work in places where you may be exposed to hepatitis B. Haemophilus influenzae type b (Hib) vaccine  You may need this if you have certain conditions. You may receive vaccines as individual doses or as more than one vaccine together in one shot (combination vaccines). Talk with your health care provider about the risks and benefits of combination vaccines. What tests do I need? Blood tests  Lipid and cholesterol levels. These may be checked every 5 years, or more frequently depending on your overall health.  Hepatitis C test.  Hepatitis B test. Screening  Lung cancer screening. You may have this screening every year starting at age 39 if you have a 30-pack-year history of smoking and currently smoke or have quit within the past 15 years.  Colorectal cancer screening. All adults should have this screening starting at age 36 and continuing until age 15. Your health care provider may recommend screening at age 23 if you are at increased risk. You will have tests every 1-10 years, depending on your results and the type of screening test.  Diabetes screening. This is done by checking your blood sugar (glucose) after you have not eaten for a while (fasting). You may have this done every 1-3 years.  Mammogram. This may be done every 1-2 years. Talk with your health care provider about how often you should have regular mammograms.  BRCA-related cancer screening. This may be done if you have a family history of breast, ovarian, tubal, or peritoneal cancers.  Other tests  Sexually transmitted disease (STD) testing.  Bone density scan. This is done to screen for osteoporosis. You may have this done starting at age 58. Follow these instructions at home: Eating and drinking  Eat a diet that includes fresh fruits and vegetables, whole grains, lean protein, and low-fat dairy products. Limit  your intake of foods with high amounts of sugar, saturated fats, and salt.  Take vitamin and mineral supplements as recommended by your health care provider.  Do not drink alcohol if your health care provider tells you not to drink.  If you drink alcohol: ? Limit how much you have to 0-1 drink a day. ? Be aware of how much alcohol is in your drink. In the [place] one drink equals one 12 oz bottle of beer (355 mL), one 5 oz glass of wine (148 mL), or one 1 oz glass of hard liquor (44 mL). Lifestyle  Take daily care of your teeth and gums.  Stay active. Exercise for at least 30 minutes on 5 or more days each week.  Do not use any products that contain nicotine or tobacco, such as cigarettes, e-cigarettes, and chewing tobacco. If you need help quitting, ask your health care provider.  If you are sexually active, practice safe sex. Use a condom or other form of protection in order to prevent STIs (sexually transmitted infections).  Talk with your health care provider about taking a low-dose aspirin or statin. What's next?  Go to your health care provider once a year for a well check visit.  Ask your health care provider how often you should have your eyes and teeth checked.  Stay up to date on all vaccines. This information is not intended to replace advice given to you by your health care provider. Make sure you discuss any questions you have with your health care provider. Document Released: 06/13/2015 Document Revised: 05/11/2018 Document Reviewed: 05/11/2018 Elsevier Patient Education  2020 [place].  Heart Failure and Exercise Heart failure is a condition in which the heart does not fill or pump enough blood and oxygen to support your body and its functions. Heart failure is a long-term (chronic) condition. Living with heart failure can be challenging. However, following your health care provider's instructions about a healthy lifestyle may help improve your symptoms. This  includes choosing the right exercise plan. Doing daily physical activity is important after a diagnosis of heart failure. You may have some activity restrictions, so talk to your health care provider before doing any exercises. What are the benefits of exercise? Exercise may:  Make your heart muscles stronger.  Lower your blood pressure.  Lower your cholesterol.  Help you lose weight.  Help your bones stay strong.  Improve your blood circulation.  Help your body use oxygen better. This relieves symptoms such as fatigue and shortness of breath.  Help your mental health by lowering the risk of depression and other problems.  Improve your quality of life.  Decrease your chance of hospital admission for heart failure. What is an exercise plan? An exercise plan is a set of specific exercises and training activities. You will work with your health care provider to create the exercise plan that works for you. The plan may include:  Different types of exercises and how to do them.  Cardiac rehabilitation exercises. These are supervised programs that are designed to strengthen your heart. What are strengthening exercises? Strengthening exercises are a type of physical activity that involves using resistance to improve your  muscle strength. Strengthening exercises usually have repetitive motions. These types of exercises can include:  Lifting weights.  Using weight machines.  Using resistance tubes and bands.  Using kettlebells.  Using your body weight, such as doing push-ups or squats. What are balance exercises? Balance exercises are another type of physical activity. They strengthen the muscles of the back, stomach, and pelvis (core muscles) and improve your balance. They can also lower your risk of falling. These types of exercises can include:  Standing on one leg.  Walking backward, sideways, and in a straight line.  Standing up after sitting, without using your hands.   Shifting your weight from one leg to the other.  Lifting one leg in front of you.  Doing tai chi. This is a type of exercise that uses slow movements and deep breathing. How can I increase my flexibility? Having better flexibility can keep you from falling. It can also lengthen your muscles, improve your range of motion, and help your joints. You can increase your flexibility by:  Doing tai chi.  Doing yoga.  Stretching. How much aerobic exercise should I get?  Aerobic exercises strengthen your breathing and circulation system and increase your body's use of oxygen. Examples of aerobic exercise include biking, walking, running, and swimming. Talk to your health care provider to find out how much aerobic exercise is safe for you.  To do these exercises:  Start exercising slowly, limiting the amount of time at first. You may need to start with 5 minutes of aerobic exercise every day.  Slowly add more minutes until you can safely do at least 30 minutes of exercise at least 4 days a week. Summary  Daily physical activity is important after a diagnosis of heart failure.  Exercise can make your heart muscles stronger. It also offers other benefits that will improve your health.  Talk to your health care provider before doing any exercises. This information is not intended to replace advice given to you by your health care provider. Make sure you discuss any questions you have with your health care provider. Document Released: 09/28/2016 Document Revised: 10/01/2016 Document Reviewed: 09/28/2016 Elsevier Patient Education  [place].  Dyslipidemia Dyslipidemia is an imbalance of waxy, fat-like substances (lipids) in the blood. The body needs lipids in small amounts. Dyslipidemia often involves a high level of cholesterol or triglycerides, which are types of lipids. Common forms of dyslipidemia include:  High levels of LDL cholesterol. LDL is the type of cholesterol that causes  fatty deposits (plaques) to build up in the blood vessels that carry blood away from your heart (arteries).  Low levels of HDL cholesterol. HDL cholesterol is the type of cholesterol that protects against heart disease. High levels of HDL remove the LDL buildup from arteries.  High levels of triglycerides. Triglycerides are a fatty substance in the blood that is linked to a buildup of plaques in the arteries. What are the causes? Primary dyslipidemia is caused by changes (mutations) in genes that are passed down through families (inherited). These mutations cause several types of dyslipidemia. Secondary dyslipidemia is caused by lifestyle choices and diseases that lead to dyslipidemia, such as:  Eating a diet that is high in animal fat.  Not getting enough exercise.  Having diabetes, kidney disease, liver disease, or thyroid disease.  Drinking large amounts of alcohol.  Using certain medicines. What increases the risk? You are more likely to develop this condition if you are an older man or if you are a  woman who has gone through menopause. Other risk factors include:  Having a family history of dyslipidemia.  Taking certain medicines, including birth control pills, steroids, some diuretics, and beta-blockers.  Smoking cigarettes.  Eating a high-fat diet.  Having certain medical conditions such as diabetes, polycystic ovary syndrome (PCOS), kidney disease, liver disease, or hypothyroidism.  Not exercising regularly.  Being overweight or obese with too much belly fat. What are the signs or symptoms? In most cases, dyslipidemia does not usually cause any symptoms. In severe cases, very high lipid levels can cause:  Fatty bumps under the skin (xanthomas).  White or gray ring around the black center (pupil) of the eye. Very high triglyceride levels can cause inflammation of the pancreas (pancreatitis). How is this diagnosed? Your health care provider may diagnose dyslipidemia  based on a routine blood test (fasting blood test). Because most people do not have symptoms of the condition, this blood testing (lipid profile) is done on adults age 23 and older and is repeated every 5 years. This test checks:  Total cholesterol. This measures the total amount of cholesterol in your blood, including LDL cholesterol, HDL cholesterol, and triglycerides. A healthy number is below 200.  LDL cholesterol. The target number for LDL cholesterol is different for each person, depending on individual risk factors. Ask your health care provider what your LDL cholesterol should be.  HDL cholesterol. An HDL level of 60 or higher is best because it helps to protect against heart disease. A number below 70 for men or below 61 for women increases the risk for heart disease.  Triglycerides. A healthy triglyceride number is below 150. If your lipid profile is abnormal, your health care provider may do other blood tests. How is this treated? Treatment depends on the type of dyslipidemia that you have and your other risk factors for heart disease and stroke. Your health care provider will have a target range for your lipid levels based on this information. For many people, this condition may be treated by lifestyle changes, such as diet and exercise. Your health care provider may recommend that you:  Get regular exercise.  Make changes to your diet.  Quit smoking if you smoke. If diet changes and exercise do not help you reach your goals, your health care provider may also prescribe medicine to lower lipids. The most commonly prescribed type of medicine lowers your LDL cholesterol (statin drug). If you have a high triglyceride level, your provider may prescribe another type of drug (fibrate) or an omega-3 fish oil supplement, or both. Follow these instructions at home:  Eating and drinking  Follow instructions from your health care provider or dietitian about eating or drinking restrictions.   Eat a healthy diet as told by your health care provider. This can help you reach and maintain a healthy weight, lower your LDL cholesterol, and raise your HDL cholesterol. This may include: ? Limiting your calories, if you are overweight. ? Eating more fruits, vegetables, whole grains, fish, and lean meats. ? Limiting saturated fat, trans fat, and cholesterol.  If you drink alcohol: ? Limit how much you use. ? Be aware of how much alcohol is in your drink. In the [place] one drink equals one 12 oz bottle of beer (355 mL), one 5 oz glass of wine (148 mL), or one 1 oz glass of hard liquor (44 mL).  Do not drink alcohol if: ? Your health care provider tells you not to drink. ? You are pregnant, may be pregnant,  or are planning to become pregnant. Activity  Get regular exercise. Start an exercise and strength training program as told by your health care provider. Ask your health care provider what activities are safe for you. Your health care provider may recommend: ? 30 minutes of aerobic activity 4-6 days a week. Brisk walking is an example of aerobic activity. ? Strength training 2 days a week. General instructions  Do not use any products that contain nicotine or tobacco, such as cigarettes, e-cigarettes, and chewing tobacco. If you need help quitting, ask your health care provider.  Take over-the-counter and prescription medicines only as told by your health care provider. This includes supplements.  Keep all follow-up visits as told by your health care provider. Contact a health care provider if:  You are: ? Having trouble sticking to your exercise or diet plan. ? Struggling to quit smoking or control your use of alcohol. Summary  Dyslipidemia often involves a high level of cholesterol or triglycerides, which are types of lipids.  Treatment depends on the type of dyslipidemia that you have and your other risk factors for heart disease and stroke.  For many people, treatment  starts with lifestyle changes, such as diet and exercise.  Your health care provider may prescribe medicine to lower lipids. This information is not intended to replace advice given to you by your health care provider. Make sure you discuss any questions you have with your health care provider. Document Released: 05/22/2013 Document Revised: 01/09/2018 Document Reviewed: 12/16/2017 Elsevier Patient Education  [place].

## 2019-02-26 ENCOUNTER — Encounter: Payer: Self-pay | Admitting: Family Medicine

## 2019-03-13 ENCOUNTER — Other Ambulatory Visit (HOSPITAL_COMMUNITY): Payer: Self-pay | Admitting: Cardiology

## 2019-03-19 ENCOUNTER — Other Ambulatory Visit: Payer: Self-pay | Admitting: Family Medicine

## 2019-03-19 DIAGNOSIS — Z1231 Encounter for screening mammogram for malignant neoplasm of breast: Secondary | ICD-10-CM

## 2019-03-25 ENCOUNTER — Telehealth: Payer: Self-pay

## 2019-03-25 NOTE — Telephone Encounter (Signed)
Left detailed VM for pt regarding appt on 10/26. Dr. Caryl Comes will not be in the office and the EP scheduler will call her to reschedule.

## 2019-03-26 ENCOUNTER — Encounter: Payer: Medicare Other | Admitting: Internal Medicine

## 2019-04-20 ENCOUNTER — Ambulatory Visit (INDEPENDENT_AMBULATORY_CARE_PROVIDER_SITE_OTHER): Payer: Medicare Other | Admitting: *Deleted

## 2019-04-20 DIAGNOSIS — I428 Other cardiomyopathies: Secondary | ICD-10-CM

## 2019-04-20 DIAGNOSIS — I5022 Chronic systolic (congestive) heart failure: Secondary | ICD-10-CM

## 2019-04-22 LAB — CUP PACEART REMOTE DEVICE CHECK
Date Time Interrogation Session: 20201122072007
Implantable Lead Implant Date: 20131211
Implantable Lead Implant Date: 20131211
Implantable Lead Implant Date: 20131211
Implantable Lead Location: 753858
Implantable Lead Location: 753859
Implantable Lead Location: 753860
Implantable Lead Model: 181
Implantable Lead Model: 4396
Implantable Lead Model: 5076
Implantable Lead Serial Number: 32342
Implantable Pulse Generator Implant Date: 20200724

## 2019-04-25 ENCOUNTER — Encounter (HOSPITAL_COMMUNITY): Payer: Self-pay

## 2019-04-25 ENCOUNTER — Other Ambulatory Visit: Payer: Self-pay

## 2019-04-25 ENCOUNTER — Emergency Department (HOSPITAL_COMMUNITY)
Admission: EM | Admit: 2019-04-25 | Discharge: 2019-04-26 | Disposition: A | Discharge status: Home / Self Care | Payer: Medicare Other | Attending: Emergency Medicine | Admitting: Emergency Medicine

## 2019-04-25 DIAGNOSIS — I110 Hypertensive heart disease with heart failure: Secondary | ICD-10-CM | POA: Insufficient documentation

## 2019-04-25 DIAGNOSIS — I5032 Chronic diastolic (congestive) heart failure: Secondary | ICD-10-CM | POA: Diagnosis not present

## 2019-04-25 DIAGNOSIS — S0990XA Unspecified injury of head, initial encounter: Secondary | ICD-10-CM | POA: Diagnosis not present

## 2019-04-25 DIAGNOSIS — R42 Dizziness and giddiness: Secondary | ICD-10-CM

## 2019-04-25 DIAGNOSIS — I952 Hypotension due to drugs: Secondary | ICD-10-CM | POA: Insufficient documentation

## 2019-04-25 DIAGNOSIS — I959 Hypotension, unspecified: Secondary | ICD-10-CM | POA: Diagnosis not present

## 2019-04-25 DIAGNOSIS — Z95811 Presence of heart assist device: Secondary | ICD-10-CM | POA: Insufficient documentation

## 2019-04-25 LAB — CBG MONITORING, ED: Glucose-Capillary: 91 mg/dL (ref 70–99)

## 2019-04-25 LAB — BASIC METABOLIC PANEL
Anion gap: 12 (ref 5–15)
BUN: 30 mg/dL — ABNORMAL HIGH (ref 8–23)
CO2: 22 mmol/L (ref 22–32)
Calcium: 9.7 mg/dL (ref 8.9–10.3)
Chloride: 107 mmol/L (ref 98–111)
Creatinine, Ser: 1.78 mg/dL — ABNORMAL HIGH (ref 0.44–1.00)
GFR calc Af Amer: 34 mL/min — ABNORMAL LOW (ref >60)
GFR calc non Af Amer: 29 mL/min — ABNORMAL LOW (ref >60)
Glucose, Bld: 103 mg/dL — ABNORMAL HIGH (ref 70–99)
Potassium: 5.1 mmol/L (ref 3.5–5.1)
Sodium: 141 mmol/L (ref 135–145)

## 2019-04-25 LAB — CBC
HCT: 31.9 % — ABNORMAL LOW (ref 36.0–46.0)
Hemoglobin: 10.3 g/dL — ABNORMAL LOW (ref 12.0–15.0)
MCH: 32.6 pg (ref 26.0–34.0)
MCHC: 32.3 g/dL (ref 30.0–36.0)
MCV: 100.9 fL — ABNORMAL HIGH (ref 80.0–100.0)
Platelets: 281 10*3/uL (ref 150–400)
RBC: 3.16 MIL/uL — ABNORMAL LOW (ref 3.87–5.11)
RDW: 12 % (ref 11.5–15.5)
WBC: 5.6 10*3/uL (ref 4.0–10.5)
nRBC: 0 % (ref 0.0–0.2)
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
## 2019-04-26 ENCOUNTER — Other Ambulatory Visit: Payer: Self-pay

## 2019-04-26 ENCOUNTER — Emergency Department (HOSPITAL_COMMUNITY): Payer: Medicare Other

## 2019-04-26 DIAGNOSIS — R42 Dizziness and giddiness: Secondary | ICD-10-CM | POA: Diagnosis not present

## 2019-04-26 DIAGNOSIS — S0990XA Unspecified injury of head, initial encounter: Secondary | ICD-10-CM | POA: Diagnosis not present

## 2019-04-26 MED ORDER — SODIUM CHLORIDE 0.9 % IV BOLUS
500.0000 mL | Freq: Once | INTRAVENOUS | Status: AC
  Administered 2019-04-26: 500 mL via INTRAVENOUS

## 2019-04-26 NOTE — ED Notes (Signed)
Pt states nothing is hurting her, everything is resolved and she wants to see the doctor so she can go home.
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
## 2019-04-26 NOTE — ED Notes (Signed)
ED Provider at bedside. 

## 2019-04-26 NOTE — ED Notes (Signed)
Medtronic Representative: [place].   Since device check two days ago, there are no arrhythmia recorded. Battery and lead are within expected measurement, except LV lead: threshold high.

## 2019-04-26 NOTE — ED Notes (Signed)
AVS given, followup information provided.  Son calling [employer] to pick mother up and bring her home.

## 2019-04-26 NOTE — Discharge Instructions (Addendum)
Please hold your morning blood pressure medication. Check your blood pressures 3-4 times per day and log them. Discuss possible need for medication adjustment with your PCP as soon as possible.

## 2019-04-26 NOTE — ED Notes (Signed)
Patient transported to CT 

## 2019-04-26 NOTE — ED Notes (Signed)
Cynthia Roth daughter in law [phone] asking to speak with someone about her mother in law

## 2019-05-07 ENCOUNTER — Ambulatory Visit
Admission: RE | Admit: 2019-05-07 | Discharge: 2019-05-07 | Disposition: A | Discharge status: Home / Self Care | Payer: Medicare Other | Source: Ambulatory Visit | Attending: Family Medicine | Admitting: Family Medicine

## 2019-05-07 ENCOUNTER — Other Ambulatory Visit: Payer: Self-pay

## 2019-05-07 DIAGNOSIS — Z1231 Encounter for screening mammogram for malignant neoplasm of breast: Secondary | ICD-10-CM | POA: Diagnosis not present

## 2019-05-07 LAB — HM MAMMOGRAPHY

## 2019-05-09 ENCOUNTER — Encounter: Payer: Medicare Other | Admitting: Internal Medicine

## 2019-05-10 ENCOUNTER — Telehealth: Payer: Self-pay | Admitting: *Deleted

## 2019-05-10 NOTE — Telephone Encounter (Signed)
Please advise. Only virtual visits available for next week.  Copied from [place] [phone]. Topic: Appointment Scheduling - Scheduling Inquiry for Clinic >> May 10, 2019 12:32 PM Rainey Pines A wrote: Pt wanting to schedule er follow up appointment in office for next week. Please advise

## 2019-05-11 ENCOUNTER — Ambulatory Visit (INDEPENDENT_AMBULATORY_CARE_PROVIDER_SITE_OTHER): Payer: Medicare Other | Admitting: Internal Medicine

## 2019-05-11 ENCOUNTER — Encounter: Payer: Self-pay | Admitting: Internal Medicine

## 2019-05-11 ENCOUNTER — Other Ambulatory Visit: Payer: Self-pay

## 2019-05-11 VITALS — BP 100/60 | HR 90 | Ht 64.5 in | Wt 191.0 lb

## 2019-05-11 DIAGNOSIS — I5042 Chronic combined systolic (congestive) and diastolic (congestive) heart failure: Secondary | ICD-10-CM | POA: Diagnosis not present

## 2019-05-11 DIAGNOSIS — I447 Left bundle-branch block, unspecified: Secondary | ICD-10-CM | POA: Diagnosis not present

## 2019-05-11 DIAGNOSIS — Z95810 Presence of automatic (implantable) cardiac defibrillator: Secondary | ICD-10-CM | POA: Diagnosis not present

## 2019-05-11 DIAGNOSIS — I428 Other cardiomyopathies: Secondary | ICD-10-CM | POA: Diagnosis not present

## 2019-05-11 MED ORDER — LISINOPRIL 10 MG PO TABS: 10.0000 mg | ORAL_TABLET | Freq: Every day | ORAL | 3 refills | Status: DC

## 2019-05-11 MED ORDER — SPIRONOLACTONE 25 MG PO TABS: 12.5000 mg | ORAL_TABLET | Freq: Every day | ORAL | 3 refills | Status: DC

## 2019-05-11 NOTE — Progress Notes (Signed)
Patient Care Team: [name], MD as PCP - General (Family Medicine) Larey Dresser, MD as Consulting Physician (Cardiology) Juanita Craver, MD as Consulting Physician (Gastroenterology)   HPI  Cynthia Roth is a 68 y.o. female Seen in followup for congestive heart failure with CRT-D implantation for nonischemic cardiomyopathy 12/13; s/p gen change 7/20   The patient denies chest pain, shortness of breath, nocturnal dyspnea, orthopnea or peripheral edema.  There have been no palpitations.    DATE TEST EF   10/13 Echo   15 %   1/17 Echo   55-60 %   8/18 Echo  50-55%    Date Cr K Hgb  2/16   13.8  8/18 1.31 4.7 9.9  11/20  1.78 5.1 10.3     Past Medical History:  Diagnosis Date  . AICD (automatic cardioverter/defibrillator) present   . Allergy   . Biventricular ICD -Medtronic    DOI 12/13 - Medtronic Viva XR CRT-D defibrillator, serial #[id number].    . CHF (congestive heart failure) ([place])   . Chronic systolic heart failure ([place]) 12/02/2011  . Colon polyps    Dr Mar Daring Mann-GI  . Dyslipidemia 12/02/2011  . Gout    "very seldom" (05/10/2012)  . Hx of colonic polyps   . Hyperlipidemia   . Hypertension    "used to have this; now my BP is low" (05/10/2012)  . LBBB (left bundle branch block) 12/02/2011  . Nonischemic cardiomyopathy ([place])   . Pre-diabetes   . Prediabetes   . Presence of permanent cardiac pacemaker   . Tubular adenoma 1999   history of    Past Surgical History:  Procedure Laterality Date  . BI-VENTRICULAR IMPLANTABLE CARDIOVERTER DEFIBRILLATOR N/A 05/10/2012   Procedure: BI-VENTRICULAR IMPLANTABLE CARDIOVERTER DEFIBRILLATOR  (CRT-D);  Surgeon: Deboraha Sprang, MD;  Location: [facility] CATH LAB;  Service: Cardiovascular;  Laterality: N/A;  . BIV ICD GENERATOR CHANGEOUT N/A 12/22/2018   Procedure: BIV ICD GENERATOR CHANGEOUT;  Surgeon: Deboraha Sprang, MD;  Location: [facility] CV LAB;  Service: Cardiovascular;  Laterality: N/A;  . CARDIAC  CATHETERIZATION  12-03-11   selectie coronary angiography  . CARDIAC DEFIBRILLATOR PLACEMENT  05/10/2012   CRT-D implantation (05/10/2012)  . COLONOSCOPY WITH PROPOFOL N/A 03/17/2017   Procedure: COLONOSCOPY WITH PROPOFOL;  Surgeon: Juanita Craver, MD;  Location: [facility] ENDOSCOPY;  Service: Endoscopy;  Laterality: N/A;  . LEFT HEART CATHETERIZATION WITH CORONARY ANGIOGRAM N/A 12/03/2011   Procedure: LEFT HEART CATHETERIZATION WITH CORONARY ANGIOGRAM;  Surgeon: Larey Dresser, MD;  Location: [facility] CATH LAB;  Service: Cardiovascular;  Laterality: N/A;  . PARTIAL HYSTERECTOMY  1980's    Current Outpatient Medications  Medication Sig Dispense Refill  . aspirin 500 MG tablet Take 325 mg by mouth daily as needed for pain.     [name] aspirin 81 MG tablet Take 81 mg by mouth daily.    [name] atorvastatin (LIPITOR) 20 MG tablet Take 1 tablet (20 mg total) by mouth daily. 90 tablet 2  . Bioflavonoid Products (VITAMIN C-BIOFLAVONOIDS) 1000-100 MG TBCR Take 1 tablet by mouth daily.    . Camphor-Eucalyptus-Menthol (VICKS VAPORUB EX) Apply 1 application topically at bedtime.    . carvedilol (COREG) 12.5 MG tablet TAKE 1 TABLET (12.5 MG TOTAL) BY MOUTH 2 (TWO) TIMES DAILY WITH A MEAL. 120 tablet 3  . Cholecalciferol (VITAMIN D3) 2000 units TABS Take 1 tablet by mouth daily.    [name] CINNAMON PO Take 1 capsule by mouth daily.    [name]  ferrous sulfate 325 (65 FE) MG tablet TAKE 1 TABLET BY MOUTH EVERY DAY WITH BREAKFAST 90 tablet 3  . furosemide (LASIX) 20 MG tablet TAKE 1 TABLET BY MOUTH EVERY OTHER DAY 20 tablet 3  . isosorbide-hydrALAZINE (BIDIL) 20-37.5 MG tablet TAKE (1/2) TABLET THREE TIMES DAILY. 135 tablet 3  . Magnesium 250 MG TABS Take 250 mg by mouth daily.    [name] OVER THE COUNTER MEDICATION Apply 1 application topically at bedtime. Sleep balm, apply to face every night    . spironolactone (ALDACTONE) 25 MG tablet TAKE 1 TABLET BY MOUTH DAILY 30 tablet 2  . SUPER B COMPLEX/C PO Take 1 tablet by mouth daily.     No current  facility-administered medications for this visit.    Allergies  Allergen Reactions  . Decongestant [Pseudoephedrine Hcl Er]     Unknown reaction    Review of Systems negative except from HPI and PMH  Physical Exam BP 100/60   Pulse 90   Ht 5' 4.5" (1.638 m)   Wt 191 lb (86.6 kg)   SpO2 99%   BMI 32.28 kg/m  Well developed and well nourished in no acute distress HENT normal Neck supple with JVP-flat Clear Device pocket well healed; without hematoma or erythema.  There is no tethering  Regular rate and rhythm, no   murmur Abd-soft with active BS No Clubbing cyanosis edema Skin-warm and dry A & Oriented  Grossly normal sensory and motor function  ECG sinus with P synchronous pacing.  Negative QRS lead V1 negative QRS lead I  Intervals 13/12/384 Axis rightward   Estimated Creatinine Clearance: 33 mL/min (A) (by C-G formula based on SCr of 1.78 mg/dL (H)). I was at Assessment and  Plan  Nonischemic cardiomyopathy interval improvement  Anemia  HFpEF  Implantable defibrillator-Medtronic-CRT  The patient's device was interrogated and the information was fully reviewed.  The device was reprogrammed to programmed LV output at 0.625 V above threshold  Renal insufficiency grade 3  Orthostatic lightheadedness   . No symptoms of shortness of breath or chest pain.  Orthostatic lightheadedness.  We will decrease her Aldactone 25--12.5 and have her take it at night and her lisinopril from 10 twice daily--10 daily.  This may also help also with renal function.  We reviewed the data regarding the withdrawal of afterload reduction in patients with recovered cardiomyopathy.  These data do not apply as best as I known to patients who have CRT associated improvement.  Still we will keep her medications on and decrease them as above.

## 2019-05-11 NOTE — Telephone Encounter (Signed)
FYI Pt request a f/u visit to review her current medications due to low BP, Pt states that the doctor at the ED told her she needs to see PCP for medication adjusting , Pt is scheduled for in office visit on 05/17/2019.

## 2019-05-11 NOTE — Patient Instructions (Addendum)
Medication Instructions:   Decrease your lisinopril to 10mg , 1 tablet, once before bed Decrease your spironolactone, 12.5mg , 1/2 tablet once before bed  *If you need a refill on your cardiac medications before your next appointment, please call your pharmacy*  Lab Work: None ordered. If you have labs (blood work) drawn today and your tests are completely normal, you will receive your results only by: [name] MyChart Message (if you have MyChart) OR . A paper copy in the mail If you have any lab test that is abnormal or we need to change your treatment, we will call you to review the results.  Testing/Procedures: None ordered.  Follow-Up: At [facility], you and your health needs are our priority.  As part of our continuing mission to provide you with exceptional heart care, we have created designated Provider Care Teams.  These Care Teams include your primary Cardiologist (physician) and Advanced Practice Providers (APPs -  Physician Assistants and Nurse Practitioners) who all work together to provide you with the care you need, when you need it.  Your next appointment:    6 months with Dr. Caryl Comes

## 2019-05-15 NOTE — Addendum Note (Signed)
Addended by: Jeremy Johann on: 05/15/2019 01:06 PM   Modules accepted: Orders

## 2019-05-17 ENCOUNTER — Ambulatory Visit: Payer: Medicare Other | Admitting: Family Medicine

## 2019-05-18 ENCOUNTER — Other Ambulatory Visit (HOSPITAL_COMMUNITY): Payer: Self-pay | Admitting: Cardiology

## 2019-05-21 NOTE — Progress Notes (Signed)
Remote ICD transmission.   

## 2019-06-22 ENCOUNTER — Other Ambulatory Visit (HOSPITAL_COMMUNITY): Payer: Self-pay | Admitting: Cardiology

## 2019-06-25 ENCOUNTER — Encounter: Payer: Self-pay | Admitting: Family Medicine

## 2019-07-23 ENCOUNTER — Ambulatory Visit (INDEPENDENT_AMBULATORY_CARE_PROVIDER_SITE_OTHER): Payer: Medicare Other | Admitting: *Deleted

## 2019-07-23 DIAGNOSIS — I428 Other cardiomyopathies: Secondary | ICD-10-CM | POA: Diagnosis not present

## 2019-07-23 LAB — CUP PACEART REMOTE DEVICE CHECK
Battery Remaining Longevity: 61 mo
Battery Voltage: 2.98 V
Brady Statistic AP VP Percent: 0.01 %
Brady Statistic AP VS Percent: 0 %
Brady Statistic AS VP Percent: 98.57 %
Brady Statistic AS VS Percent: 1.42 %
Brady Statistic RA Percent Paced: 0.01 %
Brady Statistic RV Percent Paced: 4.26 %
Date Time Interrogation Session: 20210221181709
HighPow Impedance: 80 Ohm
Implantable Lead Implant Date: 20131211
Implantable Lead Implant Date: 20131211
Implantable Lead Implant Date: 20131211
Implantable Lead Location: 753858
Implantable Lead Location: 753859
Implantable Lead Location: 753860
Implantable Lead Model: 181
Implantable Lead Model: 4396
Implantable Lead Model: 5076
Implantable Lead Serial Number: 32342
Implantable Pulse Generator Implant Date: 20200724
Lead Channel Impedance Value: 323 Ohm
Lead Channel Impedance Value: 532 Ohm
Lead Channel Impedance Value: 532 Ohm
Lead Channel Impedance Value: 608 Ohm
Lead Channel Impedance Value: 608 Ohm
Lead Channel Impedance Value: 988 Ohm
Lead Channel Pacing Threshold Amplitude: 0.625 V
Lead Channel Pacing Threshold Amplitude: 0.625 V
Lead Channel Pacing Threshold Amplitude: 2.375 V
Lead Channel Pacing Threshold Pulse Width: 0.4 ms
Lead Channel Pacing Threshold Pulse Width: 0.4 ms
Lead Channel Pacing Threshold Pulse Width: 1.5 ms
Lead Channel Sensing Intrinsic Amplitude: 10.6 mV
Lead Channel Sensing Intrinsic Amplitude: 3.5 mV
Lead Channel Setting Pacing Amplitude: 1.5 V
Lead Channel Setting Pacing Amplitude: 2.5 V
Lead Channel Setting Pacing Amplitude: 3 V
Lead Channel Setting Pacing Pulse Width: 0.4 ms
Lead Channel Setting Pacing Pulse Width: 1.5 ms
Lead Channel Setting Sensing Sensitivity: 0.45 mV

## 2019-07-24 NOTE — Progress Notes (Signed)
ICD Remote  

## 2019-07-26 ENCOUNTER — Telehealth: Payer: Self-pay | Admitting: Emergency Medicine

## 2019-07-26 NOTE — Telephone Encounter (Signed)
-----   Message from Johnna Acosta sent at 07/26/2019 11:45 AM EST ----- Regarding: Device Transmission The patient called today and wanted to know if her transmission from 02-22 went through. She said she got a new device and was not sure if it was set up yet or not.

## 2019-07-26 NOTE — Telephone Encounter (Signed)
Spoke with patient and confirmed that her transmission from the new monitor was received . Transmission was reviewed, device function WNL, no episodes or arrhythmias recorded.

## 2019-09-03 ENCOUNTER — Telehealth (INDEPENDENT_AMBULATORY_CARE_PROVIDER_SITE_OTHER): Payer: Medicare Other | Admitting: Family Medicine

## 2019-09-03 DIAGNOSIS — F321 Major depressive disorder, single episode, moderate: Secondary | ICD-10-CM

## 2019-09-03 NOTE — Progress Notes (Signed)
Virtual Visit via Telephone Note  I connected with Sherril Croon on 09/03/19 at  4:30 PM EDT by telephone and verified that I am speaking with the correct person using two identifiers.   I discussed the limitations, risks, security and privacy concerns of performing an evaluation and management service by telephone and the availability of in person appointments. I also discussed with the patient that there may be a patient responsible charge related to this service. The patient expressed understanding and agreed to proceed.  Location patient: home Location provider: work or home office Participants present for the call: patient, provider Patient did not have a visit in the prior 7 days to address this/these issue(s).   History of Present Illness: Pt is a 69 yo female with pmh sig for NICM s/p defibrillator placement, LBBB, HTN, chronic systolic heart failure, HLD who is seen for ongoing concern.   Pt states she is in a "depressive state".  Pt endorses crying a lot, dealing with family issues and recent deaths in the family.  Pt states she has let family members stay with her in her home but it is causing increased stress.  Pt stays in her room or leaves the house.  Endorses being restless at night, poor appetite, feels like she is talking more than the normally does.  Denies SI/HI.  Reading scripture for comfort.  Wants a Marketing executive, but does not want meds.  Observations/Objective: Patient sounds cheerful and well on the phone.  Becomes tearful. I do not appreciate any SOB. Speech and thought processing are grossly intact. Patient reported vitals:  Assessment and Plan: Depression, major, single episode, moderate (HCC) -PHQ 9 score 18 -GAD 7 score 3 -discussed counseling.  Given info on area faith based counseling groups.  Encouraged to call an schedule an appointment. -Given precautions  Follow Up Instructions: F/u in 1 month, sooner if needed.  I did not refer this  patient for an OV in the next 24 hours for this/these issue(s).  I discussed the assessment and treatment plan with the patient. The patient was provided an opportunity to ask questions and all were answered. The patient agreed with the plan and demonstrated an understanding of the instructions.   The patient was advised to call back or seek an in-person evaluation if the symptoms worsen or if the condition fails to improve as anticipated.  I provided 26:52 minutes of non-face-to-face time during this encounter.   [name], MD

## 2019-09-09 ENCOUNTER — Other Ambulatory Visit: Payer: Self-pay | Admitting: Family Medicine

## 2019-09-09 DIAGNOSIS — D649 Anemia, unspecified: Secondary | ICD-10-CM

## 2019-09-10 ENCOUNTER — Other Ambulatory Visit: Payer: Self-pay

## 2019-09-10 MED ORDER — ATORVASTATIN CALCIUM 20 MG PO TABS: 20.0000 mg | ORAL_TABLET | Freq: Every day | ORAL | 2 refills | Status: DC

## 2019-09-25 ENCOUNTER — Telehealth: Payer: Self-pay | Admitting: Family Medicine

## 2019-09-25 ENCOUNTER — Encounter: Payer: Self-pay | Admitting: Family Medicine

## 2019-09-25 NOTE — Telephone Encounter (Signed)
Wanting to know if she can take two different supplements and if they will interact with the medications that she is already taking.  GOLI - Apple cider gummies Resurge - natural supplement  Please advise

## 2019-10-01 NOTE — Telephone Encounter (Signed)
Please advise 

## 2019-10-08 NOTE — Telephone Encounter (Signed)
Pt called to check on her message. Another message has been sent in regards to these supplements. Pt would like a call back asap about taking those supplements.    Pt can be reached at [phone]

## 2019-10-09 NOTE — Telephone Encounter (Signed)
The gummies are fine.  Pt does not need Resurge.  You can take a daily multivitamin if you want.

## 2019-10-10 NOTE — Telephone Encounter (Signed)
Spoke with pt verbalized understanding of Dr Volanda Napoleon advise

## 2019-10-10 NOTE — Telephone Encounter (Signed)
Spoke with pt aware of Dr Volanda Napoleon advise/ recommendation

## 2019-10-22 ENCOUNTER — Ambulatory Visit (INDEPENDENT_AMBULATORY_CARE_PROVIDER_SITE_OTHER): Payer: Medicare Other | Admitting: *Deleted

## 2019-10-22 DIAGNOSIS — I428 Other cardiomyopathies: Secondary | ICD-10-CM

## 2019-10-22 DIAGNOSIS — I447 Left bundle-branch block, unspecified: Secondary | ICD-10-CM

## 2019-10-22 LAB — CUP PACEART REMOTE DEVICE CHECK
Battery Remaining Longevity: 57 mo
Battery Voltage: 2.98 V
Brady Statistic RV Percent Paced: 5.4 %
Date Time Interrogation Session: 20210523195631
HighPow Impedance: 82 Ohm
Implantable Lead Implant Date: 20131211
Implantable Lead Implant Date: 20131211
Implantable Lead Implant Date: 20131211
Implantable Lead Location: 753858
Implantable Lead Location: 753859
Implantable Lead Location: 753860
Implantable Lead Model: 181
Implantable Lead Model: 4396
Implantable Lead Model: 5076
Implantable Lead Serial Number: 32342
Implantable Pulse Generator Implant Date: 20200724
Lead Channel Impedance Value: 342 Ohm
Lead Channel Impedance Value: 494 Ohm
Lead Channel Impedance Value: 513 Ohm
Lead Channel Impedance Value: 608 Ohm
Lead Channel Impedance Value: 627 Ohm
Lead Channel Impedance Value: 950 Ohm
Lead Channel Pacing Threshold Amplitude: 0.625 V
Lead Channel Pacing Threshold Amplitude: 0.625 V
Lead Channel Pacing Threshold Amplitude: 2.375 V
Lead Channel Pacing Threshold Pulse Width: 0.4 ms
Lead Channel Pacing Threshold Pulse Width: 0.4 ms
Lead Channel Pacing Threshold Pulse Width: 1.5 ms
Lead Channel Sensing Intrinsic Amplitude: 12.4 mV
Lead Channel Sensing Intrinsic Amplitude: 3.3 mV
Lead Channel Setting Pacing Amplitude: 1.5 V
Lead Channel Setting Pacing Amplitude: 2.5 V
Lead Channel Setting Pacing Amplitude: 3 V
Lead Channel Setting Pacing Pulse Width: 0.4 ms
Lead Channel Setting Pacing Pulse Width: 1.5 ms
Lead Channel Setting Sensing Sensitivity: 0.45 mV

## 2019-10-23 NOTE — Progress Notes (Signed)
Remote ICD transmission.   

## 2019-10-28 ENCOUNTER — Other Ambulatory Visit (HOSPITAL_COMMUNITY): Payer: Self-pay | Admitting: Cardiology

## 2019-11-01 NOTE — Progress Notes (Signed)
Virtual Visit via Telephone Note   This visit type was conducted due to national recommendations for restrictions regarding the COVID-19 Pandemic (e.g. social distancing) in an effort to limit this patient's exposure and mitigate transmission in our community.  Due to her co-morbid illnesses, this patient is at least at moderate risk for complications without adequate follow up.  This format is felt to be most appropriate for this patient at this time.  The patient did not have access to video technology/had technical difficulties with video requiring transitioning to audio format only (telephone).  All issues noted in this document were discussed and addressed.  No physical exam could be performed with this format.  Please refer to the patient's chart for her  consent to telehealth for [facility].   Evaluation Performed:  Follow-up visit  This visit type was conducted due to national recommendations for restrictions regarding the COVID-19 Pandemic (e.g. social distancing).  This format is felt to be most appropriate for this patient at this time.  All issues noted in this document were discussed and addressed.  No physical exam was performed (except for noted visual exam findings with Video Visits).  Please refer to the patient's chart (MyChart message for video visits and phone note for telephone visits) for the patient's consent to telehealth for [facility]  Date:  11/02/2019   ID:  Sherril Croon, DOB 07-08-1950, MRN [id number]  Patient Location:  [place] [address]   Provider location:     [facility] [place] [address] Office [phone] Fax [phone]   PCP:  [name], MD  Cardiologist:  Skeet Latch, MD  Electrophysiologist:  None   Chief Complaint: Follow-up for chronic systolic and diastolic heart failure/hypertension  History of Present Illness:    Cynthia Roth  is a 69 y.o. female who presents via audio/video conferencing for a telehealth visit today.  Patient verified DOB and address.  She has a PMH of hypertension, LBBB, chronic systolic heart failure, nonischemic cardiomyopathy, dyslipidemia, and biventricular ICD.  She was initially a patient of Dr. [name].  She was admitted to [facility] 7/13 with acute systolic and diastolic heart failure.  Her left ventricular was severely dilated and her ejection fraction was 15%.  She also had a left bundle branch block.  Left heart cath at that time showed mild, nonobstructive CAD.  An echocardiogram 3 months later showed persistently depressed LV function.  She had a ICD implanted 12/13.  An echocardiogram 3/14 showed an LV function of 20% with mild-moderate MR and normal RV function.  Follow-up echocardiogram 1/17 showed an improvement to 55-60% and 50-55 in 8/18.  She was discharged from advanced heart failure service 8/18 due to her significant improvements with her LVEF.  She was last seen by Dr. Oval Linsey on 10/19.  During that time she was doing well.  She had been doing Zumba 3 days/week.  She had no chest pain or shortness of breath with exertion.  She reported feeling drowsy much of the time.  She would sleep approximately 5 hours per night.  She would wake up 2-3 times per night to urinate.  She was unsure whether she snored.  She did not feel rested in the mornings and she would fall asleep easily during the day.  At sometimes she noticed palpitations when lying down but not throughout the day.  She also had periods of lightheadedness 3-4 times per week.  This  would occur with position changes.  She denied syncope.  Her lisinopril was reduced from 40mg  to 20 mg due to hypotension.  She was noted to have mild edema but no orthopnea or PND.  She is seen virtually today and states she feels well.  Up until 2 weeks ago she was doing Zumba several days a week and walking 5 miles a week with Silver sneakers.  She did  not have any exertional chest pain.  She recently had a death in the family and was taking on more activity with transportation for her family.  She states she was also carrying heavy bags.  She had chest pain on Tuesday that was present all day.  She states it was nonradiating.  With movement she said the pain was more intense and rated it an 8-9 out of 10 however with no movement she had no pain.  She states that today and for the last few days she has had no pain in her chest.  She also stated that she was taking more than usual apple cider vinegar Gummies and she felt that this also contributed to her discomfort that day.  She has since reduced her Gummies to 1 daily instead of 4/day.  She states that she is had some dietary indiscretion.  She ate bacon 3 days in a row last week and noticed that her weight increased.  She tries to avoid salt in her diet and is currently at 193 pounds.  I will give her the salty 6 diet sheet, have her come in for a repeat BMP, maintain her physical activity and follow-up with Dr. Oval Linsey in 6 months.  I have instructed her to contact our office if she has any more episodes of chest discomfort.  Today she denies chest pain, shortness of breath, lower extremity edema, fatigue, palpitations, melena, hematuria, hemoptysis, diaphoresis, weakness, presyncope, syncope, orthopnea, and PND.   The patient does not symptoms concerning for COVID-19 infection (fever, chills, cough, or new SHORTNESS OF BREATH).    Prior CV studies:   The following studies were reviewed today:  EKG 04/27/2019 Atrially sensed ventricular paced 72 bpm  Echocardiogram 11/2011 LVEF 15%.  Diffuse hypokinesis.  Moderate mitral regurgitation.  IVC dilated with normal respirophasic variation.  Echocardiogram 01/18/2017 Study Conclusions   - Left ventricle: The cavity size was normal. Wall thickness was  normal. Systolic function was normal. The estimated ejection  fraction was in the range of  50% to 55%. Wall motion was normal;  there were no regional wall motion abnormalities. Features are  consistent with a pseudonormal left ventricular filling pattern,  with concomitant abnormal relaxation and increased filling  pressure (grade 2 diastolic dysfunction).  - Mitral valve: There was mild regurgitation.   Past Medical History:  Diagnosis Date  . AICD (automatic cardioverter/defibrillator) present   . Allergy   . Biventricular ICD -Medtronic    DOI 12/13 - Medtronic Viva XR CRT-D defibrillator, serial #[id number].    . CHF (congestive heart failure) ([place])   . Chronic systolic heart failure ([place]) 12/02/2011  . Colon polyps    Dr Mar D[facility]  . Dyslipidemia 12/02/2011  . Gout    "very seldom" (05/10/2012)  . Hx of colonic polyps   . Hyperlipidemia   . Hypertension    "used to have this; now my BP is low" (05/10/2012)  . LBBB (left bundle branch block) 12/02/2011  . Nonischemic cardiomyopathy ([place])   . Pre-diabetes   . Prediabetes   .  Presence of permanent cardiac pacemaker   . Tubular adenoma 1999   history of   Past Surgical History:  Procedure Laterality Date  . BI-VENTRICULAR IMPLANTABLE CARDIOVERTER DEFIBRILLATOR N/A 05/10/2012   Procedure: BI-VENTRICULAR IMPLANTABLE CARDIOVERTER DEFIBRILLATOR  (CRT-D);  Surgeon: Deboraha Sprang, MD;  Location[facility] CATH LAB;  Service: Cardiovascular;  Laterality: N/A;  . BIV ICD GENERATOR CHANGEOUT N/A 12/22/2018   Procedure: BIV ICD GENERATOR CHANGEOUT;  Surgeon: Deboraha Sprang, MD;  Location: [facility] CV LAB;  Service: Cardiovascular;  Laterality: N/A;  . CARDIAC CATHETERIZATION  12-03-11   selectie coronary angiography  . CARDIAC DEFIBRILLATOR PLACEMENT  05/10/2012   CRT-D implantation (05/10/2012)  . COLONOSCOPY WITH PROPOFOL N/A 03/17/2017   Procedure: COLONOSCOPY WITH PROPOFOL;  Surgeon: Juanita Craver, MD;  Location: [facility] ENDOSCOPY;  Service: Endoscopy;  Laterality: N/A;  . LEFT HEART CATHETERIZATION WITH CORONARY  ANGIOGRAM N/A 12/03/2011   Procedure: LEFT HEART CATHETERIZATION WITH CORONARY ANGIOGRAM;  Surgeon: Larey Dresser, MD;  Location[facility] C[facility] LAB;  Service: Cardiovascular;  Laterality: N/A;  . PARTIAL HYSTERECTOMY  1980's     Current Meds  Medication Sig  . aspirin 500 MG tablet Take 325 mg by mouth daily as needed for pain.   [name] aspirin 81 MG tablet Take 81 mg by mouth daily.  [name] atorvastatin (LIPITOR) 20 MG tablet Take 1 tablet (20 mg total) by mouth daily.  [name] Bioflavonoid Products (VITAMIN C-BIOFLAVONOIDS) 1000-100 MG TBCR Take 1 tablet by mouth daily.  . carvedilol (COREG) 12.5 MG tablet TAKE 1 TABLET (12.5 MG TOTAL) BY MOUTH 2 (TWO) TIMES DAILY WITH A MEAL.  [name] Cholecalciferol (VITAMIN D3) 250 MCG (10000 UT) TABS Take 1 tablet by mouth every other day.   [name] CINNAMON PO Take 1 capsule by mouth daily.  . ferrous sulfate 325 (65 FE) MG tablet TAKE 1 TABLET BY MOUTH EVERY DAY WITH BREAKFAST  . furosemide (LASIX) 20 MG tablet TAKE 1 TABLET BY MOUTH EVERY OTHER DAY  . isosorbide-hydrALAZINE (BIDIL) 20-37.5 MG tablet TAKE (1/2) TABLET THREE TIMES DAILY.  [name] lisinopril (ZESTRIL) 10 MG tablet Take 1 tablet (10 mg total) by mouth at bedtime.  . Magnesium 250 MG TABS Take 200 mg by mouth daily.   [name] spironolactone (ALDACTONE) 25 MG tablet Take 0.5 tablets (12.5 mg total) by mouth at bedtime.  . SUPER B COMPLEX/C PO Take 1 tablet by mouth daily.     Allergies:   Decongestant [pseudoephedrine hcl er]   Social History   Tobacco Use  . Smoking status: Never Smoker  . Smokeless tobacco: Never Used  Substance Use Topics  . Alcohol use: No    Alcohol/week: 0.0 standard drinks  . Drug use: No     Family Hx: The patient's family history includes Angina in her mother; Breast cancer in her maternal grandmother; Colon cancer in her maternal grandmother and another family member; Dementia in her mother; Diabetes in her brother and father; Heart attack in her maternal grandmother; Heart failure in her  maternal grandmother; Intellectual disability in an other family member; Kidney disease in her father; Sleep apnea in her brother; Stroke in her maternal grandmother. There is no history of Hypertension.  ROS:   Please see the history of present illness.     All other systems reviewed and are negative.   Labs/Other Tests and Data Reviewed:    Recent Labs: 04/25/2019: BUN 30; Creatinine, Ser 1.78; Hemoglobin 10.3; Platelets 281; Potassium 5.1; Sodium 141   Recent Lipid Panel Lab Results  Component Value Date/Time   CHOL 127 02/22/2019 01:50 PM   TRIG 60.0 02/22/2019 01:50 PM   HDL 38.60 (L) 02/22/2019 01:50 PM   CHOLHDL 3 02/22/2019 01:50 PM   LDLCALC 77 02/22/2019 01:50 PM    Wt Readings from Last 3 Encounters:  11/02/19 193 lb (87.5 kg)  05/11/19 191 lb (86.6 kg)  02/22/19 194 lb (88 kg)     Exam:    Vital Signs:  BP 125/69   Pulse 74   Ht 5' 4.5" (1.638 m)   Wt 193 lb (87.5 kg)   BMI 32.62 kg/m    Well nourished, well developed female in no  acute distress.   ASSESSMENT & PLAN:    1.  Chronic systolic and diastolic heart failure-no increased DOE or activity intolerance.  Echocardiogram 8/18 showed an improvement of her LVEF to 50-55%.  On 12/13 she had a CRT-D implanted which is managed by EP.  Last seen by Dr. Caryl Comes on 12/20 and her device is functioning well. Continue carvedilol, furosemide, BiDil, lisinopril, spironolactone Heart healthy low-sodium diet-salty 6 given Increase physical activity as tolerated Daily weights  Chest pain-no chest pain today.  Had 1 day of chest pain earlier in the week.  Appears to be muscular versus epigastric in nature.  She has had no further episodes of chest pain/discomfort.  She also had increased stress recently related to a death in the family.  It was nonexertional and she remains extremely physically active doing Zumba and walking daily.  She has no chest pain with these activities Continue to monitor  Essential  hypertension-BP today 125/69.  Well-controlled at home Continuecarvedilol, furosemide, BiDil, lisinopril, spironolactone Heart healthy low-sodium diet-salty 6 given Increase physical activity as tolerated Order BMP  Obesity-weight today 193 pounds Heart healthy low-sodium diet-salty 6 given Increase physical activity as tolerated Continue weight loss  Chronic renal insufficiency grade 3-creatinine 1.78 on 11/20. Followed by PCP  Disposition: Follow-up with Dr. Oval Linsey in 6 months.  COVID-19 Education: The signs and symptoms of COVID-19 were discussed with the patient and how to seek care for testing (follow up with PCP or arrange E-visit).  The importance of social distancing was discussed today.  Patient Risk:   After full review of this patients clinical status, I feel that they are at least moderate risk at this time.  Time:   Today, I have spent 16 minutes with the patient with telehealth technology discussing diet, exercise, medication, chest pain, blood pressure, and kidney function.  I spent greater than 20 minutes reviewing her chart, medications, lab work, and tests prior to her visit.   Medication Adjustments/Labs and Tests Ordered: Current medicines are reviewed at length with the patient today.  Concerns regarding medicines are outlined above.   Tests Ordered: No orders of the defined types were placed in this encounter.  Medication Changes: No orders of the defined types were placed in this encounter.   Disposition:  in 6 month(s)  Signed, Jossie Ng. Pecola Haxton NP-C    01/02/2019 11:58 AM    [place] [place] [address] Office [phone] Fax [phone]

## 2019-11-02 ENCOUNTER — Encounter: Payer: Self-pay | Admitting: General Practice

## 2019-11-02 ENCOUNTER — Telehealth (INDEPENDENT_AMBULATORY_CARE_PROVIDER_SITE_OTHER): Payer: Medicare Other | Admitting: General Practice

## 2019-11-02 VITALS — BP 125/69 | HR 74 | Ht 64.5 in | Wt 193.0 lb

## 2019-11-02 DIAGNOSIS — Z6832 Body mass index (BMI) 32.0-32.9, adult: Secondary | ICD-10-CM | POA: Diagnosis not present

## 2019-11-02 DIAGNOSIS — E6609 Other obesity due to excess calories: Secondary | ICD-10-CM

## 2019-11-02 DIAGNOSIS — I10 Essential (primary) hypertension: Secondary | ICD-10-CM

## 2019-11-02 DIAGNOSIS — N183 Chronic kidney disease, stage 3 unspecified: Secondary | ICD-10-CM

## 2019-11-02 DIAGNOSIS — R0789 Other chest pain: Secondary | ICD-10-CM

## 2019-11-02 DIAGNOSIS — I5042 Chronic combined systolic (congestive) and diastolic (congestive) heart failure: Secondary | ICD-10-CM

## 2019-11-02 NOTE — Patient Instructions (Signed)
Medication Instructions:  The current medical regimen is effective;  continue present plan and medications as directed. Please refer to the Current Medication list given to you today. *If you need a refill on your cardiac medications before your next appointment, please call your pharmacy*  Lab Work: BMET TODAY THIS IS NOT FASTING If you have labs (blood work) drawn today and your tests are completely normal, you will receive your results only by:  [place] (if you have MyChart) OR A paper copy in the mail.  If you have any lab test that is abnormal or we need to change your treatment, we will call you to review the results. You may go to any [facility] that is convenient for you however, we do have a lab in our office that is able to assist you. You DO NOT need an appointment for our lab. The lab is open 8:00am and closes at 4:00pm. Lunch 12:45 - 1:45pm.  Special Instructions MAINTAIN PHYSICAL ACTIVITY  PLEASE READ AND FOLLOW SALTY 6-ATTACHED  Follow-Up: Your next appointment:  12 month(s) Please call our office 2 months in advance to schedule this appointment In Person with Skeet Latch, MD  At [facility], you and your health needs are our priority.  As part of our continuing mission to provide you with exceptional heart care, we have created designated Provider Care Teams.  These Care Teams include your primary Cardiologist (physician) and Advanced Practice Providers (APPs -  Physician Assistants and Nurse Practitioners) who all work together to provide you with the care you need, when you need it.

## 2019-11-03 LAB — BASIC METABOLIC PANEL
BUN/Creatinine Ratio: 12 (ref 12–28)
BUN: 15 mg/dL (ref 8–27)
CO2: 25 mmol/L (ref 20–29)
Calcium: 10.1 mg/dL (ref 8.7–10.3)
Chloride: 105 mmol/L (ref 96–106)
Creatinine, Ser: 1.25 mg/dL — ABNORMAL HIGH (ref 0.57–1.00)
GFR calc Af Amer: 51 mL/min/{1.73_m2} — ABNORMAL LOW (ref >59)
GFR calc non Af Amer: 44 mL/min/{1.73_m2} — ABNORMAL LOW (ref >59)
Glucose: 88 mg/dL (ref 65–99)
Potassium: 5.1 mmol/L (ref 3.5–5.2)
Sodium: 143 mmol/L (ref 134–144)

## 2019-11-14 ENCOUNTER — Ambulatory Visit (INDEPENDENT_AMBULATORY_CARE_PROVIDER_SITE_OTHER): Payer: Medicare Other | Admitting: Student

## 2019-11-14 ENCOUNTER — Other Ambulatory Visit: Payer: Self-pay

## 2019-11-14 ENCOUNTER — Encounter: Payer: Self-pay | Admitting: Student

## 2019-11-14 VITALS — BP 98/64 | HR 75 | Ht 64.0 in | Wt 196.0 lb

## 2019-11-14 DIAGNOSIS — N183 Chronic kidney disease, stage 3 unspecified: Secondary | ICD-10-CM

## 2019-11-14 DIAGNOSIS — I447 Left bundle-branch block, unspecified: Secondary | ICD-10-CM | POA: Diagnosis not present

## 2019-11-14 DIAGNOSIS — I5042 Chronic combined systolic (congestive) and diastolic (congestive) heart failure: Secondary | ICD-10-CM | POA: Diagnosis not present

## 2019-11-14 DIAGNOSIS — R0789 Other chest pain: Secondary | ICD-10-CM | POA: Diagnosis not present

## 2019-11-14 DIAGNOSIS — Z95810 Presence of automatic (implantable) cardiac defibrillator: Secondary | ICD-10-CM

## 2019-11-14 LAB — CUP PACEART INCLINIC DEVICE CHECK
Date Time Interrogation Session: 20210616095553
Implantable Lead Implant Date: 20131211
Implantable Lead Implant Date: 20131211
Implantable Lead Implant Date: 20131211
Implantable Lead Location: 753858
Implantable Lead Location: 753859
Implantable Lead Location: 753860
Implantable Lead Model: 181
Implantable Lead Model: 4396
Implantable Lead Model: 5076
Implantable Lead Serial Number: 32342
Implantable Pulse Generator Implant Date: 20200724

## 2019-11-14 NOTE — Patient Instructions (Addendum)
Medication Instructions:  Your physician has recommended you make the following change in your medication:  -- Please stop taking Over the counter (OTC) Potassium  *If you need a refill on your cardiac medications before your next appointment, please call your pharmacy*  Lab Work: If you have labs (blood work) drawn today and your tests are completely normal, you will receive your results only by: [name] MyChart Message (if you have MyChart) OR . A paper copy in the mail If you have any lab test that is abnormal or we need to change your treatment, we will call you to review the results.  Testing/Procedures: None  Follow-Up: At [facility], you and your health needs are our priority.  As part of our continuing mission to provide you with exceptional heart care, we have created designated Provider Care Teams.  These Care Teams include your primary Cardiologist (physician) and Advanced Practice Providers (APPs -  Physician Assistants and Nurse Practitioners) who all work together to provide you with the care you need, when you need it.  We recommend signing up for the patient portal called "MyChart".  Sign up information is provided on this After Visit Summary.  MyChart is used to connect with patients for Virtual Visits (Telemedicine).  Patients are able to view lab/test results, encounter notes, upcoming appointments, etc.  Non-urgent messages can be sent to your provider as well.   To learn more about what you can do with MyChart, go to [email or web address].    Your next appointment:   Your physician wants you to follow-up in: 6 Months with Dr. Caryl Comes. You will receive a reminder letter in the mail two months in advance. If you don't receive a letter, please call our office to schedule the follow-up appointment.   The format for your next appointment:   In Person  Provider:   Virl Axe, MD  Other Instructions

## 2019-11-14 NOTE — Progress Notes (Signed)
Electrophysiology Office Note Date: 11/14/2019  ID:  Cynthia Roth [birth year], MRN [id number]  PCP: [name], MD Primary Cardiologist: Skeet Latch, MD Electrophysiologist: Virl Axe, MD   CC: Routine ICD follow-up  Cynthia Roth is a 69 y.o. female seen today for Virl Axe, MD for routine electrophysiology followup. At last visit, lisinopril and aldactone were both decreased in the setting of orthostasis lightheadedness.  Since last being seen in our clinic the patient reports doing well. She had an episode of chest pain several weeks ago and was seen by [facility]. It lasted most of the day, was non-radiating, and thought to possibly be related to diet. She has been doing Zumba with exertion since and has had no exertional chest pain. She denies palpitations, dyspnea, PND, orthopnea, nausea, vomiting, dizziness, syncope, edema, weight gain, or early satiety. She has not had ICD shocks.   Device History: Medtronic BiV ICD implanted 04/2012 for NICM, gen change 12/22/2018 History of appropriate therapy: No History of AAD therapy: No   Past Medical History:  Diagnosis Date  . AICD (automatic cardioverter/defibrillator) present   . Allergy   . Biventricular ICD -Medtronic    DOI 12/13 - Medtronic Viva XR CRT-D defibrillator, serial #[id number].    . CHF (congestive heart failure) ([place])   . Chronic systolic heart failure ([place]) 12/02/2011  . Colon polyps    Dr Mar Daring [facility]  . Dyslipidemia 12/02/2011  . Gout    "very seldom" (05/10/2012)  . Hx of colonic polyps   . Hyperlipidemia   . Hypertension    "used to have this; now my BP is low" (05/10/2012)  . LBBB (left bundle branch block) 12/02/2011  . Nonischemic cardiomyopathy ([place])   . Pre-diabetes   . Prediabetes   . Presence of permanent cardiac pacemaker   . Tubular adenoma 1999   history of   Past Surgical History:  Procedure Laterality Date  . BI-VENTRICULAR IMPLANTABLE CARDIOVERTER  DEFIBRILLATOR N/A 05/10/2012   Procedure: BI-VENTRICULAR IMPLANTABLE CARDIOVERTER DEFIBRILLATOR  (CRT-D);  Surgeon: Deboraha Sprang, MD;  Location: [facility] CATH LAB;  Service: Cardiovascular;  Laterality: N/A;  . BIV ICD GENERATOR CHANGEOUT N/A 12/22/2018   Procedure: BIV ICD GENERATOR CHANGEOUT;  Surgeon: Deboraha Sprang, MD;  Location: [facility] CV LAB;  Service: Cardiovascular;  Laterality: N/A;  . CARDIAC CATHETERIZATION  12-03-11   selectie coronary angiography  . CARDIAC DEFIBRILLATOR PLACEMENT  05/10/2012   CRT-D implantation (05/10/2012)  . COLONOSCOPY WITH PROPOFOL N/A 03/17/2017   Procedure: COLONOSCOPY WITH PROPOFOL;  Surgeon: Juanita Craver, MD;  Location: [facility] ENDOSCOPY;  Service: Endoscopy;  Laterality: N/A;  . LEFT HEART CATHETERIZATION WITH CORONARY ANGIOGRAM N/A 12/03/2011   Procedure: LEFT HEART CATHETERIZATION WITH CORONARY ANGIOGRAM;  Surgeon: Larey Dresser, MD;  Location: [facility] CATH LAB;  Service: Cardiovascular;  Laterality: N/A;  . PARTIAL HYSTERECTOMY  1980's    Current Outpatient Medications  Medication Sig Dispense Refill  . aspirin 500 MG tablet Take 325 mg by mouth daily as needed for pain.     [name] aspirin 81 MG tablet Take 81 mg by mouth daily.    [name] atorvastatin (LIPITOR) 20 MG tablet Take 1 tablet (20 mg total) by mouth daily. 90 tablet 2  . Bioflavonoid Products (VITAMIN C-BIOFLAVONOIDS) 1000-100 MG TBCR Take 1 tablet by mouth daily.    . Camphor-Eucalyptus-Menthol (VICKS VAPORUB EX) Apply 1 application topically at bedtime.    . carvedilol (COREG) 12.5 MG tablet TAKE 1 TABLET (12.5 MG TOTAL)  BY MOUTH 2 (TWO) TIMES DAILY WITH A MEAL. 180 tablet 1  . Cholecalciferol (VITAMIN D3) 250 MCG (10000 UT) TABS Take 1 tablet by mouth every other day.     [name] CINNAMON PO Take 1 capsule by mouth daily.    . ferrous sulfate 325 (65 FE) MG tablet TAKE 1 TABLET BY MOUTH EVERY DAY WITH BREAKFAST 90 tablet 3  . furosemide (LASIX) 20 MG tablet TAKE 1 TABLET BY MOUTH EVERY OTHER DAY 20 tablet 1   . isosorbide-hydrALAZINE (BIDIL) 20-37.5 MG tablet TAKE (1/2) TABLET THREE TIMES DAILY. 135 tablet 3  . lisinopril (ZESTRIL) 10 MG tablet Take 1 tablet (10 mg total) by mouth at bedtime. 90 tablet 3  . Magnesium 250 MG TABS Take 200 mg by mouth daily.     [name] MELATONIN GUMMIES PO Take by mouth at bedtime.    [name] OVER THE COUNTER MEDICATION Apply 1 application topically at bedtime. Sleep balm, apply to face every night    . SUPER B COMPLEX/C PO Take 1 tablet by mouth daily.    [name] spironolactone (ALDACTONE) 25 MG tablet Take 0.5 tablets (12.5 mg total) by mouth at bedtime. 90 tablet 3   No current facility-administered medications for this visit.    Allergies:   Decongestant [pseudoephedrine hcl er]   Social History: Social History   Socioeconomic History  . Marital status: Divorced    Spouse name: n/a  . Number of children: 1  . Years of education: Master's  . Highest education level: Not on file  Occupational History  . Occupation: Conservator, museum/gallery  Tobacco Use  . Smoking status: Never Smoker  . Smokeless tobacco: Never Used  Vaping Use  . Vaping Use: Never used  Substance and Sexual Activity  . Alcohol use: No    Alcohol/week: 0.0 standard drinks  . Drug use: No  . Sexual activity: Never  Other Topics Concern  . Not on file  Social History Narrative   Lives in [place], and her mother, grandmother and one brother live with her. Divorced with one adult son who lives in [place], [place]. One brother lives in [place], [place]. Another lives in [place]. 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   Social Determinants of Health   Financial Resource Strain:   . Difficulty of Paying Living Expenses:   Food Insecurity:   . Worried About Charity fundraiser in the Last Year:   . Arboriculturist in the Last Year:   Transportation Needs:   . Film/video editor (Medical):   [name] Lack of Transportation (Non-Medical):    Physical Activity:   . Days of Exercise per Week:   . Minutes of Exercise per Session:   Stress:   . Feeling of Stress :   Social Connections:   . Frequency of Communication with Friends and Family:   . Frequency of Social Gatherings with Friends and Family:   . Attends Religious Services:   . Active Member of Clubs or Organizations:   . Attends Archivist Meetings:   [name] Marital Status:   Intimate Partner Violence:   . Fear of Current or Ex-Partner:   . Emotionally Abused:   [name] Physically Abused:   . Sexually Abused:     Family History: Family History  Problem Relation Age of Onset  . Angina Mother        diagnosed at age of 91's, unsure whether it  was a true diagnosis. unsure about the treatment  . Dementia Mother   . Sleep apnea Brother   . Kidney disease Father   . Diabetes Father   . Diabetes Brother   . Heart failure Maternal Grandmother        diagnosed in age of 86's. still living at age of 30's.  [name] Heart attack Maternal Grandmother   . Stroke Maternal Grandmother   . Breast cancer Maternal Grandmother   . Colon cancer Maternal Grandmother   . Colon cancer Other        family history  . Intellectual disability Other   . Hypertension Neg Hx     Review of Systems: All other systems reviewed and are otherwise negative except as noted above.   Physical Exam: Vitals:   11/14/19 0926  BP: 98/64  Pulse: 75  SpO2: 98%  Weight: 196 lb (88.9 kg)  Height: 5\' 4"  (1.626 m)     GEN- The patient is well appearing, alert and oriented x 3 today.   HEENT: normocephalic, atraumatic; sclera clear, conjunctiva pink; hearing intact; oropharynx clear; neck supple, no JVP Lymph- no cervical lymphadenopathy Lungs- Clear to ausculation bilaterally, normal work of breathing.  No wheezes, rales, rhonchi Heart- Regular rate and rhythm, no murmurs, rubs or gallops, PMI not laterally displaced GI- soft, non-tender, non-distended, bowel sounds present, no  hepatosplenomegaly Extremities- no clubbing, cyanosis, or edema; DP/PT/radial pulses 2+ bilaterally MS- no significant deformity or atrophy Skin- warm and dry, no rash or lesion; ICD pocket well healed Psych- euthymic mood, full affect Neuro- strength and sensation are intact  ICD interrogation- reviewed in detail today,  See PACEART report  EKG:  EKG is ordered today. The ekg ordered today shows NSR (V pacing) at 78 bpm, personally reviewed.   Recent Labs: 04/25/2019: Hemoglobin 10.3; Platelets 281 11/02/2019: BUN 15; Creatinine, Ser 1.25; Potassium 5.1; Sodium 143   Wt Readings from Last 3 Encounters:  11/14/19 196 lb (88.9 kg)  11/02/19 193 lb (87.5 kg)  05/11/19 191 lb (86.6 kg)     Other studies Reviewed: Additional studies/ records that were reviewed today include: Previous EP office notes, [facility] notes, recent labwork  Assessment and Plan:  1.  Chronic systolic dysfunction s/p Medtronic CRT-D  euvolemic today Stable on an appropriate medical regimen Normal ICD function See Pace Art report No changes today  2. Orthostatic lightheadedness  This has resolved with medication adjustments.   3. CKD III Recent Cr stable, K borderline. See below.   4. Anemia Per PCP  5. Borderline hyperkalemia Pt is having banana and strawberry smoothies intermittently, but had planned to increase. She is also taking OTC potassium not listed on her medications.  I have asked her to stop taking OTC potassium.   Current medicines are reviewed at length with the patient today.   The patient does not have concerns regarding her medicines.  The following changes were made today:  Instructed to stop taking OTC potassium as she is on spironolactone (not listed)  Labs/ tests ordered today include:  Orders Placed This Encounter  Procedures  . CUP PACEART [place]  . EKG 12-Lead    Disposition:   Follow up with Dr. Caryl Comes 12 months  Signed, Shirley Friar, PA-C   11/14/2019 10:03 AM  [facility] [address] [place] [employer] [facility] [place] [address] [phone] (office) [phone] (fax)

## 2019-11-30 ENCOUNTER — Encounter: Payer: Medicare Other | Admitting: Internal Medicine

## 2019-12-24 ENCOUNTER — Other Ambulatory Visit (HOSPITAL_COMMUNITY): Payer: Self-pay | Admitting: Cardiology

## 2020-01-11 ENCOUNTER — Other Ambulatory Visit: Payer: Self-pay

## 2020-01-11 MED ORDER — FUROSEMIDE 20 MG PO TABS: 20.0000 mg | ORAL_TABLET | ORAL | 9 refills | Status: DC

## 2020-01-21 ENCOUNTER — Ambulatory Visit (INDEPENDENT_AMBULATORY_CARE_PROVIDER_SITE_OTHER): Payer: Medicare Other | Admitting: *Deleted

## 2020-01-21 DIAGNOSIS — I428 Other cardiomyopathies: Secondary | ICD-10-CM | POA: Diagnosis not present

## 2020-01-21 LAB — CUP PACEART REMOTE DEVICE CHECK
Battery Remaining Longevity: 52 mo
Battery Voltage: 2.97 V
Brady Statistic AP VP Percent: 0.04 %
Brady Statistic AP VS Percent: 0 %
Brady Statistic AS VP Percent: 97.01 %
Brady Statistic AS VS Percent: 2.94 %
Brady Statistic RA Percent Paced: 0.07 %
Brady Statistic RV Percent Paced: 4.16 %
Date Time Interrogation Session: 20210822215443
HighPow Impedance: 81 Ohm
Implantable Lead Implant Date: 20131211
Implantable Lead Implant Date: 20131211
Implantable Lead Implant Date: 20131211
Implantable Lead Location: 753858
Implantable Lead Location: 753859
Implantable Lead Location: 753860
Implantable Lead Model: 181
Implantable Lead Model: 4396
Implantable Lead Model: 5076
Implantable Lead Serial Number: 32342
Implantable Pulse Generator Implant Date: 20200724
Lead Channel Impedance Value: 323 Ohm
Lead Channel Impedance Value: 475 Ohm
Lead Channel Impedance Value: 494 Ohm
Lead Channel Impedance Value: 665 Ohm
Lead Channel Impedance Value: 684 Ohm
Lead Channel Impedance Value: 931 Ohm
Lead Channel Pacing Threshold Amplitude: 0.625 V
Lead Channel Pacing Threshold Amplitude: 0.625 V
Lead Channel Pacing Threshold Amplitude: 2.375 V
Lead Channel Pacing Threshold Pulse Width: 0.4 ms
Lead Channel Pacing Threshold Pulse Width: 0.4 ms
Lead Channel Pacing Threshold Pulse Width: 1.5 ms
Lead Channel Sensing Intrinsic Amplitude: 11.3 mV
Lead Channel Sensing Intrinsic Amplitude: 3.4 mV
Lead Channel Setting Pacing Amplitude: 1.5 V
Lead Channel Setting Pacing Amplitude: 2.5 V
Lead Channel Setting Pacing Amplitude: 3 V
Lead Channel Setting Pacing Pulse Width: 0.4 ms
Lead Channel Setting Pacing Pulse Width: 1.5 ms
Lead Channel Setting Sensing Sensitivity: 0.45 mV

## 2020-01-28 NOTE — Progress Notes (Signed)
Remote ICD transmission.   

## 2020-02-19 ENCOUNTER — Other Ambulatory Visit: Payer: Self-pay | Admitting: Internal Medicine

## 2020-02-26 ENCOUNTER — Other Ambulatory Visit: Payer: Self-pay

## 2020-02-27 ENCOUNTER — Encounter: Payer: Self-pay | Admitting: Family Medicine

## 2020-02-27 ENCOUNTER — Other Ambulatory Visit (HOSPITAL_COMMUNITY)
Admission: RE | Admit: 2020-02-27 | Discharge: 2020-02-27 | Disposition: A | Discharge status: Home / Self Care | Payer: Medicare Other | Source: Ambulatory Visit | Attending: Family Medicine | Admitting: Family Medicine

## 2020-02-27 ENCOUNTER — Ambulatory Visit (INDEPENDENT_AMBULATORY_CARE_PROVIDER_SITE_OTHER): Payer: Medicare Other | Admitting: Family Medicine

## 2020-02-27 VITALS — BP 104/76 | HR 74 | Temp 98.6°F | Ht 64.0 in | Wt 197.8 lb

## 2020-02-27 DIAGNOSIS — Z23 Encounter for immunization: Secondary | ICD-10-CM

## 2020-02-27 DIAGNOSIS — Z780 Asymptomatic menopausal state: Secondary | ICD-10-CM

## 2020-02-27 DIAGNOSIS — Z124 Encounter for screening for malignant neoplasm of cervix: Secondary | ICD-10-CM | POA: Diagnosis not present

## 2020-02-27 DIAGNOSIS — E782 Mixed hyperlipidemia: Secondary | ICD-10-CM

## 2020-02-27 DIAGNOSIS — Z0000 Encounter for general adult medical examination without abnormal findings: Secondary | ICD-10-CM | POA: Diagnosis not present

## 2020-02-27 DIAGNOSIS — Z1159 Encounter for screening for other viral diseases: Secondary | ICD-10-CM | POA: Diagnosis not present

## 2020-02-27 DIAGNOSIS — W109XXA Fall (on) (from) unspecified stairs and steps, initial encounter: Secondary | ICD-10-CM

## 2020-02-27 DIAGNOSIS — I10 Essential (primary) hypertension: Secondary | ICD-10-CM

## 2020-02-27 DIAGNOSIS — Z1151 Encounter for screening for human papillomavirus (HPV): Secondary | ICD-10-CM | POA: Diagnosis not present

## 2020-02-27 NOTE — Progress Notes (Signed)
Subjective:     Cynthia Roth is a 69 y.o. female and is here for a comprehensive physical exam.  Pt states she has been doing well.  States feeling a little better since last visit but still dealing with family stress.  States blood pressure has been good on current medications.  Denies shortness of breath, LE edema, CP.  Denies myalgias with Lipitor 20 mg.  Pt notes recent fall on front steps while carrying bags.  Unsure of how tripped or landed.  Endorses ecchymosis on RUE, LEs, and abrasion on LUE.  Patient endorses having to [place] COVID-19 vaccine with the second being on 07/28/2019.  Look for vaccine record. Social History   Socioeconomic History  . Marital status: Divorced    Spouse name: n/a  . Number of children: 1  . Years of education: Master's  . Highest education level: Not on file  Occupational History  . Occupation: Conservator, museum/gallery  Tobacco Use  . Smoking status: Never Smoker  . Smokeless tobacco: Never Used  Vaping Use  . Vaping Use: Never used  Substance and Sexual Activity  . Alcohol use: No    Alcohol/week: 0.0 standard drinks  . Drug use: No  . Sexual activity: Never  Other Topics Concern  . Not on file  Social History Narrative   Lives in [place], and her mother, grandmother and one brother live with her. Divorced with one adult son who lives in [place], [place]. One brother lives in [place], [place]. Another lives in [place]. 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   Social Determinants of Health   Financial Resource Strain:   . Difficulty of Paying Living Expenses: Not on file  Food Insecurity:   . Worried About Charity fundraiser in the Last Year: Not on file  . Ran Out of Food in the Last Year: Not on file  Transportation Needs:   . Lack of Transportation (Medical): Not on file  . Lack of Transportation (Non-Medical): Not on file  Physical Activity:   . Days of Exercise  per Week: Not on file  . Minutes of Exercise per Session: Not on file  Stress:   . Feeling of Stress : Not on file  Social Connections:   . Frequency of Communication with Friends and Family: Not on file  . Frequency of Social Gatherings with Friends and Family: Not on file  . Attends Religious Services: Not on file  . Active Member of Clubs or Organizations: Not on file  . Attends Archivist Meetings: Not on file  . Marital Status: Not on file  Intimate Partner Violence:   . Fear of Current or Ex-Partner: Not on file  . Emotionally Abused: Not on file  . Physically Abused: Not on file  . Sexually Abused: Not on file   Health Maintenance  Topic Date Due  . Hepatitis C Screening  Never done  . COVID-19 Vaccine (1) Never done  . TETANUS/TDAP  Never done  . DEXA SCAN  Never done  . PNA vac Low Risk Adult (1 of 2 - PCV13) Never done  . INFLUENZA VACCINE  12/30/2019  . MAMMOGRAM  05/06/2021  . COLONOSCOPY  03/18/2027    The following portions of the patient's history were reviewed and updated as appropriate: allergies, current medications, past family history, past medical history, past social history, past surgical history and problem list.  Review of Systems Pertinent items  noted in HPI and remainder of comprehensive ROS otherwise negative.   Objective:    BP 104/76 (BP Location: Left Arm, Patient Position: Sitting, Cuff Size: Large)   Pulse 74   Temp 98.6 F (37 C) (Oral)   Ht $R'5\' 4"'Jc$  (1.626 m)   Wt 197 lb 12.8 oz (89.7 kg)   SpO2 98%   BMI 33.95 kg/m  General appearance: alert, cooperative and no distress Head: Normocephalic, without obvious abnormality, atraumatic Eyes: conjunctivae/corneas clear. PERRL, EOM's intact. Fundi benign. Ears: normal TM's and external ear canals both ears Nose: Nares normal. Septum midline. Mucosa normal. No drainage or sinus tenderness. Throat: lips, mucosa, and tongue normal; teeth and gums normal Neck: no adenopathy, no carotid  bruit, no JVD, supple, symmetrical, trachea midline and thyroid not enlarged, symmetric, no tenderness/mass/nodules Lungs: clear to auscultation bilaterally Heart: regular rate and rhythm, S1, S2 normal, no murmur, click, rub or gallop Abdomen: soft, non-tender; bowel sounds normal; no masses,  no organomegaly Pelvic: cervix normal in appearance, external genitalia normal, no adnexal masses or tenderness, no cervical motion tenderness, rectovaginal septum normal, uterus normal size, shape, and consistency and vagina normal without discharge Extremities: extremities normal, atraumatic, no cyanosis or edema Pulses: 2+ and symmetric Skin: Skin color, texture, turgor normal. No rashes or lesions Lymph nodes: Cervical, supraclavicular, and axillary nodes normal. Neurologic: Alert and oriented X 3, normal strength and tone. Normal symmetric reflexes. Normal coordination and gait    Assessment:    Healthy female exam.     Plan:     Anticipatory guidance given including wearing seatbelts, smoke detectors in the home, increasing physical activity, increasing p.o. intake of water and vegetables. -will obtain labs -Pap done this visit -Mammogram done 05/07/2019 -colonoscopy done 03/17/17.  Due 2028 -Pfizer COVID vaccine given 07/07/2019 and 07/28/2019 -given handout -next CPE in 1 yr See After Visit Summary for Counseling Recommendations    Essential hypertension  -controlled -continue lifestyle modifications -continue lisinopril 10 mg, lasix 20 mg, bidil 20-37.5 mg 1/2 tab TID, coreg 12.5 mg BID, spironolactone 12.5 mg - Plan: BMP with eGFR([facility]), CBC with Differential/Platelet  Cervical cancer screening  - Plan: PAP [[place]]  Mixed hyperlipidemia  -Continue lifestyle modifications -Continue Lipitor 20 mg - Plan: Lipid panel, Hemoglobin A1c  Need for hepatitis C screening test  - Plan: Hep C Antibody  Asymptomatic menopausal state  - Plan: DG Bone Density  Fall from  stairs -discussed safety -Given precautions  Need for vaccination against Streptococcus pneumoniae using pneumococcal conjugate vaccine 13  - Plan: Pneumococcal conjugate vaccine 13-valent  F/u prn  Grier Mitts, MD

## 2020-02-27 NOTE — Patient Instructions (Signed)
Preventive Care 68 Years and Older, Female Preventive care refers to lifestyle choices and visits with your health care provider that can promote health and wellness. This includes:  A yearly physical exam. This is also called an annual well check.  Regular dental and eye exams.  Immunizations.  Screening for certain conditions.  Healthy lifestyle choices, such as diet and exercise. What can I expect for my preventive care visit? Physical exam Your health care provider will check:  Height and weight. These may be used to calculate body mass index (BMI), which is a measurement that tells if you are at a healthy weight.  Heart rate and blood pressure.  Your skin for abnormal spots. Counseling Your health care provider may ask you questions about:  Alcohol, tobacco, and drug use.  Emotional well-being.  Home and relationship well-being.  Sexual activity.  Eating habits.  History of falls.  Memory and ability to understand (cognition).  Work and work Statistician.  Pregnancy and menstrual history. What immunizations do I need?  Influenza (flu) vaccine  This is recommended every year. Tetanus, diphtheria, and pertussis (Tdap) vaccine  You may need a Td booster every 10 years. Varicella (chickenpox) vaccine  You may need this vaccine if you have not already been vaccinated. Zoster (shingles) vaccine  You may need this after age 68. Pneumococcal conjugate (PCV13) vaccine  One dose is recommended after age 68. Pneumococcal polysaccharide (PPSV23) vaccine  One dose is recommended after age 72. Measles, mumps, and rubella (MMR) vaccine  You may need at least one dose of MMR if you were born in 1956 or later. You may also need a second dose. Meningococcal conjugate (MenACWY) vaccine  You may need this if you have certain conditions. Hepatitis A vaccine  You may need this if you have certain conditions or if you travel or work in places where you may be exposed  to hepatitis A. Hepatitis B vaccine  You may need this if you have certain conditions or if you travel or work in places where you may be exposed to hepatitis B. Haemophilus influenzae type b (Hib) vaccine  You may need this if you have certain conditions. You may receive vaccines as individual doses or as more than one vaccine together in one shot (combination vaccines). Talk with your health care provider about the risks and benefits of combination vaccines. What tests do I need? Blood tests  Lipid and cholesterol levels. These may be checked every 5 years, or more frequently depending on your overall health.  Hepatitis C test.  Hepatitis B test. Screening  Lung cancer screening. You may have this screening every year starting at age 68 if you have a 30-pack-year history of smoking and currently smoke or have quit within the past 15 years.  Colorectal cancer screening. All adults should have this screening starting at age 68 and continuing until age 15. Your health care provider may recommend screening at age 23 if you are at increased risk. You will have tests every 1-10 years, depending on your results and the type of screening test.  Diabetes screening. This is done by checking your blood sugar (glucose) after you have not eaten for a while (fasting). You may have this done every 1-3 years.  Mammogram. This may be done every 1-2 years. Talk with your health care provider about how often you should have regular mammograms.  BRCA-related cancer screening. This may be done if you have a family history of breast, ovarian, tubal, or peritoneal cancers.  Other tests  Sexually transmitted disease (STD) testing.  Bone density scan. This is done to screen for osteoporosis. You may have this done starting at age 68. Follow these instructions at home: Eating and drinking  Eat a diet that includes fresh fruits and vegetables, whole grains, lean protein, and low-fat dairy products. Limit  your intake of foods with high amounts of sugar, saturated fats, and salt.  Take vitamin and mineral supplements as recommended by your health care provider.  Do not drink alcohol if your health care provider tells you not to drink.  If you drink alcohol: ? Limit how much you have to 0-1 drink a day. ? Be aware of how much alcohol is in your drink. In the [place] one drink equals one 12 oz bottle of beer (355 mL), one 5 oz glass of wine (148 mL), or one 1 oz glass of hard liquor (44 mL). Lifestyle  Take daily care of your teeth and gums.  Stay active. Exercise for at least 30 minutes on 5 or more days each week.  Do not use any products that contain nicotine or tobacco, such as cigarettes, e-cigarettes, and chewing tobacco. If you need help quitting, ask your health care provider.  If you are sexually active, practice safe sex. Use a condom or other form of protection in order to prevent STIs (sexually transmitted infections).  Talk with your health care provider about taking a low-dose aspirin or statin. What's next?  Go to your health care provider once a year for a well check visit.  Ask your health care provider how often you should have your eyes and teeth checked.  Stay up to date on all vaccines. This information is not intended to replace advice given to you by your health care provider. Make sure you discuss any questions you have with your health care provider. Document Revised: 05/11/2018 Document Reviewed: 05/11/2018 Elsevier Patient Education  2020 [employer].  Managing Your Hypertension Hypertension is commonly called high blood pressure. This is when the force of your blood pressing against the walls of your arteries is too strong. Arteries are blood vessels that carry blood from your heart throughout your body. Hypertension forces the heart to work harder to pump blood, and may cause the arteries to become narrow or stiff. Having untreated or uncontrolled  hypertension can cause heart attack, stroke, kidney disease, and other problems. What are blood pressure readings? A blood pressure reading consists of a higher number over a lower number. Ideally, your blood pressure should be below 120/80. The first ("top") number is called the systolic pressure. It is a measure of the pressure in your arteries as your heart beats. The second ("bottom") number is called the diastolic pressure. It is a measure of the pressure in your arteries as the heart relaxes. What does my blood pressure reading mean? Blood pressure is classified into four stages. Based on your blood pressure reading, your health care provider may use the following stages to determine what type of treatment you need, if any. Systolic pressure and diastolic pressure are measured in a unit called mm Hg. Normal  Systolic pressure: below 191.  Diastolic pressure: below 80. Elevated  Systolic pressure: 478-295.  Diastolic pressure: below 80. Hypertension stage 1  Systolic pressure: 621-308.  Diastolic pressure: 65-78. Hypertension stage 2  Systolic pressure: 469 or above.  Diastolic pressure: 90 or above. What health risks are associated with hypertension? Managing your hypertension is an important responsibility. Uncontrolled hypertension can lead to:  A heart attack.  A stroke.  A weakened blood vessel (aneurysm).  Heart failure.  Kidney damage.  Eye damage.  Metabolic syndrome.  Memory and concentration problems. What changes can I make to manage my hypertension? Hypertension can be managed by making lifestyle changes and possibly by taking medicines. Your health care provider will help you make a plan to bring your blood pressure within a normal range. Eating and drinking   Eat a diet that is high in fiber and potassium, and low in salt (sodium), added sugar, and fat. An example eating plan is called the DASH (Dietary Approaches to Stop Hypertension) diet. To eat  this way: ? Eat plenty of fresh fruits and vegetables. Try to fill half of your plate at each meal with fruits and vegetables. ? Eat whole grains, such as whole wheat pasta, brown rice, or whole grain bread. Fill about one quarter of your plate with whole grains. ? Eat low-fat diary products. ? Avoid fatty cuts of meat, processed or cured meats, and poultry with skin. Fill about one quarter of your plate with lean proteins such as fish, chicken without skin, beans, eggs, and tofu. ? Avoid premade and processed foods. These tend to be higher in sodium, added sugar, and fat.  Reduce your daily sodium intake. Most people with hypertension should eat less than 1,500 mg of sodium a day.  Limit alcohol intake to no more than 1 drink a day for nonpregnant women and 2 drinks a day for men. One drink equals 12 oz of beer, 5 oz of wine, or 1 oz of hard liquor. Lifestyle  Work with your health care provider to maintain a healthy body weight, or to lose weight. Ask what an ideal weight is for you.  Get at least 30 minutes of exercise that causes your heart to beat faster (aerobic exercise) most days of the week. Activities may include walking, swimming, or biking.  Include exercise to strengthen your muscles (resistance exercise), such as weight lifting, as part of your weekly exercise routine. Try to do these types of exercises for 30 minutes at least 3 days a week.  Do not use any products that contain nicotine or tobacco, such as cigarettes and e-cigarettes. If you need help quitting, ask your health care provider.  Control any long-term (chronic) conditions you have, such as high cholesterol or diabetes. Monitoring  Monitor your blood pressure at home as told by your health care provider. Your personal target blood pressure may vary depending on your medical conditions, your age, and other factors.  Have your blood pressure checked regularly, as often as told by your health care provider. Working  with your health care provider  Review all the medicines you take with your health care provider because there may be side effects or interactions.  Talk with your health care provider about your diet, exercise habits, and other lifestyle factors that may be contributing to hypertension.  Visit your health care provider regularly. Your health care provider can help you create and adjust your plan for managing hypertension. Will I need medicine to control my blood pressure? Your health care provider may prescribe medicine if lifestyle changes are not enough to get your blood pressure under control, and if:  Your systolic blood pressure is 130 or higher.  Your diastolic blood pressure is 80 or higher. Take medicines only as told by your health care provider. Follow the directions carefully. Blood pressure medicines must be taken as prescribed. The medicine does not  work as well when you skip doses. Skipping doses also puts you at risk for problems. Contact a health care provider if:  You think you are having a reaction to medicines you have taken.  You have repeated (recurrent) headaches.  You feel dizzy.  You have swelling in your ankles.  You have trouble with your vision. Get help right away if:  You develop a severe headache or confusion.  You have unusual weakness or numbness, or you feel faint.  You have severe pain in your chest or abdomen.  You vomit repeatedly.  You have trouble breathing. Summary  Hypertension is when the force of blood pumping through your arteries is too strong. If this condition is not controlled, it may put you at risk for serious complications.  Your personal target blood pressure may vary depending on your medical conditions, your age, and other factors. For most people, a normal blood pressure is less than 120/80.  Hypertension is managed by lifestyle changes, medicines, or both. Lifestyle changes include weight loss, eating a healthy,  low-sodium diet, exercising more, and limiting alcohol. This information is not intended to replace advice given to you by your health care provider. Make sure you discuss any questions you have with your health care provider. Document Revised: 09/08/2018 Document Reviewed: 04/14/2016 Elsevier Patient Education  [place].  Dyslipidemia Dyslipidemia is an imbalance of waxy, fat-like substances (lipids) in the blood. The body needs lipids in small amounts. Dyslipidemia often involves a high level of cholesterol or triglycerides, which are types of lipids. Common forms of dyslipidemia include:  High levels of LDL cholesterol. LDL is the type of cholesterol that causes fatty deposits (plaques) to build up in the blood vessels that carry blood away from your heart (arteries).  Low levels of HDL cholesterol. HDL cholesterol is the type of cholesterol that protects against heart disease. High levels of HDL remove the LDL buildup from arteries.  High levels of triglycerides. Triglycerides are a fatty substance in the blood that is linked to a buildup of plaques in the arteries. What are the causes? Primary dyslipidemia is caused by changes (mutations) in genes that are passed down through families (inherited). These mutations cause several types of dyslipidemia. Secondary dyslipidemia is caused by lifestyle choices and diseases that lead to dyslipidemia, such as:  Eating a diet that is high in animal fat.  Not getting enough exercise.  Having diabetes, kidney disease, liver disease, or thyroid disease.  Drinking large amounts of alcohol.  Using certain medicines. What increases the risk? You are more likely to develop this condition if you are an older man or if you are a woman who has gone through menopause. Other risk factors include:  Having a family history of dyslipidemia.  Taking certain medicines, including birth control pills, steroids, some diuretics, and  beta-blockers.  Smoking cigarettes.  Eating a high-fat diet.  Having certain medical conditions such as diabetes, polycystic ovary syndrome (PCOS), kidney disease, liver disease, or hypothyroidism.  Not exercising regularly.  Being overweight or obese with too much belly fat. What are the signs or symptoms? In most cases, dyslipidemia does not usually cause any symptoms. In severe cases, very high lipid levels can cause:  Fatty bumps under the skin (xanthomas).  White or gray ring around the black center (pupil) of the eye. Very high triglyceride levels can cause inflammation of the pancreas (pancreatitis). How is this diagnosed? Your health care provider may diagnose dyslipidemia based on a routine blood test (fasting  blood test). Because most people do not have symptoms of the condition, this blood testing (lipid profile) is done on adults age 7 and older and is repeated every 5 years. This test checks:  Total cholesterol. This measures the total amount of cholesterol in your blood, including LDL cholesterol, HDL cholesterol, and triglycerides. A healthy number is below 200.  LDL cholesterol. The target number for LDL cholesterol is different for each person, depending on individual risk factors. Ask your health care provider what your LDL cholesterol should be.  HDL cholesterol. An HDL level of 60 or higher is best because it helps to protect against heart disease. A number below 40 for men or below 50 for women increases the risk for heart disease.  Triglycerides. A healthy triglyceride number is below 150. If your lipid profile is abnormal, your health care provider may do other blood tests. How is this treated? Treatment depends on the type of dyslipidemia that you have and your other risk factors for heart disease and stroke. Your health care provider will have a target range for your lipid levels based on this information. For many people, this condition may be treated by  lifestyle changes, such as diet and exercise. Your health care provider may recommend that you:  Get regular exercise.  Make changes to your diet.  Quit smoking if you smoke. If diet changes and exercise do not help you reach your goals, your health care provider may also prescribe medicine to lower lipids. The most commonly prescribed type of medicine lowers your LDL cholesterol (statin drug). If you have a high triglyceride level, your provider may prescribe another type of drug (fibrate) or an omega-3 fish oil supplement, or both. Follow these instructions at home:  Eating and drinking  Follow instructions from your health care provider or dietitian about eating or drinking restrictions.  Eat a healthy diet as told by your health care provider. This can help you reach and maintain a healthy weight, lower your LDL cholesterol, and raise your HDL cholesterol. This may include: ? Limiting your calories, if you are overweight. ? Eating more fruits, vegetables, whole grains, fish, and lean meats. ? Limiting saturated fat, trans fat, and cholesterol.  If you drink alcohol: ? Limit how much you use. ? Be aware of how much alcohol is in your drink. In the [place] one drink equals one 12 oz bottle of beer (355 mL), one 5 oz glass of wine (148 mL), or one 1 oz glass of hard liquor (44 mL).  Do not drink alcohol if: ? Your health care provider tells you not to drink. ? You are pregnant, may be pregnant, or are planning to become pregnant. Activity  Get regular exercise. Start an exercise and strength training program as told by your health care provider. Ask your health care provider what activities are safe for you. Your health care provider may recommend: ? 30 minutes of aerobic activity 4-6 days a week. Brisk walking is an example of aerobic activity. ? Strength training 2 days a week. General instructions  Do not use any products that contain nicotine or tobacco, such as cigarettes,  e-cigarettes, and chewing tobacco. If you need help quitting, ask your health care provider.  Take over-the-counter and prescription medicines only as told by your health care provider. This includes supplements.  Keep all follow-up visits as told by your health care provider. Contact a health care provider if:  You are: ? Having trouble sticking to your exercise or diet  plan. ? Struggling to quit smoking or control your use of alcohol. Summary  Dyslipidemia often involves a high level of cholesterol or triglycerides, which are types of lipids.  Treatment depends on the type of dyslipidemia that you have and your other risk factors for heart disease and stroke.  For many people, treatment starts with lifestyle changes, such as diet and exercise.  Your health care provider may prescribe medicine to lower lipids. This information is not intended to replace advice given to you by your health care provider. Make sure you discuss any questions you have with your health care provider. Document Revised: 01/09/2018 Document Reviewed: 12/16/2017 Elsevier Patient Education  2020 [employer].  Fall Prevention in the Home, Adult Falls can cause injuries. They can happen to people of all ages. There are many things you can do to make your home safe and to help prevent falls. Ask for help when making these changes, if needed. What actions can I take to prevent falls? General Instructions  Use good lighting in all rooms. Replace any light bulbs that burn out.  Turn on the lights when you go into a dark area. Use night-lights.  Keep items that you use often in easy-to-reach places. Lower the shelves around your home if necessary.  Set up your furniture so you have a clear path. Avoid moving your furniture around.  Do not have throw rugs and other things on the floor that can make you trip.  Avoid walking on wet floors.  If any of your floors are uneven, fix them.  Add color or contrast  paint or tape to clearly mark and help you see: ? Any grab bars or handrails. ? First and last steps of stairways. ? Where the edge of each step is.  If you use a stepladder: ? Make sure that it is fully opened. Do not climb a closed stepladder. ? Make sure that both sides of the stepladder are locked into place. ? Ask someone to hold the stepladder for you while you use it.  If there are any pets around you, be aware of where they are. What can I do in the bathroom?      Keep the floor dry. Clean up any water that spills onto the floor as soon as it happens.  Remove soap buildup in the tub or shower regularly.  Use non-skid mats or decals on the floor of the tub or shower.  Attach bath mats securely with double-sided, non-slip rug tape.  If you need to sit down in the shower, use a plastic, non-slip stool.  Install grab bars by the toilet and in the tub and shower. Do not use towel bars as grab bars. What can I do in the bedroom?  Make sure that you have a light by your bed that is easy to reach.  Do not use any sheets or blankets that are too big for your bed. They should not hang down onto the floor.  Have a firm chair that has side arms. You can use this for support while you get dressed. What can I do in the kitchen?  Clean up any spills right away.  If you need to reach something above you, use a strong step stool that has a grab bar.  Keep electrical cords out of the way.  Do not use floor polish or wax that makes floors slippery. If you must use wax, use non-skid floor wax. What can I do with my stairs?  Do  not leave any items on the stairs.  Make sure that you have a light switch at the top of the stairs and the bottom of the stairs. If you do not have them, ask someone to add them for you.  Make sure that there are handrails on both sides of the stairs, and use them. Fix handrails that are broken or loose. Make sure that handrails are as long as the  stairways.  Install non-slip stair treads on all stairs in your home.  Avoid having throw rugs at the top or bottom of the stairs. If you do have throw rugs, attach them to the floor with carpet tape.  Choose a carpet that does not hide the edge of the steps on the stairway.  Check any carpeting to make sure that it is firmly attached to the stairs. Fix any carpet that is loose or worn. What can I do on the outside of my home?  Use bright outdoor lighting.  Regularly fix the edges of walkways and driveways and fix any cracks.  Remove anything that might make you trip as you walk through a door, such as a raised step or threshold.  Trim any bushes or trees on the path to your home.  Regularly check to see if handrails are loose or broken. Make sure that both sides of any steps have handrails.  Install guardrails along the edges of any raised decks and porches.  Clear walking paths of anything that might make someone trip, such as tools or rocks.  Have any leaves, snow, or ice cleared regularly.  Use sand or salt on walking paths during winter.  Clean up any spills in your garage right away. This includes grease or oil spills. What other actions can I take?  Wear shoes that: ? Have a low heel. Do not wear high heels. ? Have rubber bottoms. ? Are comfortable and fit you well. ? Are closed at the toe. Do not wear open-toe sandals.  Use tools that help you move around (mobility aids) if they are needed. These include: ? Canes. ? Walkers. ? Scooters. ? Crutches.  Review your medicines with your doctor. Some medicines can make you feel dizzy. This can increase your chance of falling. Ask your doctor what other things you can do to help prevent falls. Where to find more information  Centers for Disease Control and Prevention, [facility]AD[facility]: https://garcia.biz/  [employer] on Aging: BrainJudge.co.uk Contact a doctor if:  You are afraid of falling at home.  You  feel weak, drowsy, or dizzy at home.  You fall at home. Summary  There are many simple things that you can do to make your home safe and to help prevent falls.  Ways to make your home safe include removing tripping hazards and installing grab bars in the bathroom.  Ask for help when making these changes in your home. This information is not intended to replace advice given to you by your health care provider. Make sure you discuss any questions you have with your health care provider. Document Revised: 09/07/2018 Document Reviewed: 12/30/2016 Elsevier Patient Education  2020 [employer].

## 2020-02-28 LAB — CBC WITH DIFFERENTIAL/PLATELET
Absolute Monocytes: 372 cells/uL (ref 200–950)
Basophils Absolute: 53 cells/uL (ref 0–200)
Basophils Relative: 0.9 %
Eosinophils Absolute: 189 cells/uL (ref 15–500)
Eosinophils Relative: 3.2 %
HCT: 29.7 % — ABNORMAL LOW (ref 35.0–45.0)
Hemoglobin: 10.1 g/dL — ABNORMAL LOW (ref 11.7–15.5)
Lymphs Abs: 1505 cells/uL (ref 850–3900)
MCH: 32.8 pg (ref 27.0–33.0)
MCHC: 34 g/dL (ref 32.0–36.0)
MCV: 96.4 fL (ref 80.0–100.0)
MPV: 9.2 fL (ref 7.5–12.5)
Monocytes Relative: 6.3 %
Neutro Abs: 3782 cells/uL (ref 1500–7800)
Neutrophils Relative %: 64.1 %
Platelets: 284 10*3/uL (ref 140–400)
RBC: 3.08 10*6/uL — ABNORMAL LOW (ref 3.80–5.10)
RDW: 12.2 % (ref 11.0–15.0)
Total Lymphocyte: 25.5 %
WBC: 5.9 10*3/uL (ref 3.8–10.8)

## 2020-02-28 LAB — LIPID PANEL
Cholesterol: 142 mg/dL (ref <200)
HDL: 42 mg/dL — ABNORMAL LOW (ref >=50)
LDL Cholesterol (Calc): 81 mg/dL (calc)
Non-HDL Cholesterol (Calc): 100 mg/dL (calc) (ref <130)
Total CHOL/HDL Ratio: 3.4 (calc) (ref <5.0)
Triglycerides: 91 mg/dL (ref <150)

## 2020-02-28 LAB — HEMOGLOBIN A1C
Hgb A1c MFr Bld: 5.9 % of total Hgb — ABNORMAL HIGH (ref <5.7)
Mean Plasma Glucose: 123 (calc)
eAG (mmol/L): 6.8 (calc)

## 2020-02-28 LAB — HEPATITIS C ANTIBODY
Hepatitis C Ab: NONREACTIVE
SIGNAL TO CUT-OFF: 0.02 (ref <1.00)

## 2020-02-28 LAB — BASIC METABOLIC PANEL WITH GFR
BUN/Creatinine Ratio: 13 (calc) (ref 6–22)
BUN: 19 mg/dL (ref 7–25)
CO2: 28 mmol/L (ref 20–32)
Calcium: 9.7 mg/dL (ref 8.6–10.4)
Chloride: 106 mmol/L (ref 98–110)
Creat: 1.46 mg/dL — ABNORMAL HIGH (ref 0.50–0.99)
GFR, Est African American: 42 mL/min/{1.73_m2} — ABNORMAL LOW (ref >=60)
GFR, Est Non African American: 37 mL/min/{1.73_m2} — ABNORMAL LOW (ref >=60)
Glucose, Bld: 88 mg/dL (ref 65–99)
Potassium: 4.3 mmol/L (ref 3.5–5.3)
Sodium: 140 mmol/L (ref 135–146)

## 2020-02-28 LAB — CYTOLOGY - PAP
Comment: NEGATIVE
Diagnosis: NEGATIVE
High risk HPV: NEGATIVE

## 2020-03-01 DIAGNOSIS — R7303 Prediabetes: Secondary | ICD-10-CM | POA: Insufficient documentation

## 2020-03-03 ENCOUNTER — Ambulatory Visit (INDEPENDENT_AMBULATORY_CARE_PROVIDER_SITE_OTHER): Payer: Medicare Other

## 2020-03-03 ENCOUNTER — Other Ambulatory Visit: Payer: Self-pay

## 2020-03-03 DIAGNOSIS — Z0000 Encounter for general adult medical examination without abnormal findings: Secondary | ICD-10-CM

## 2020-03-03 DIAGNOSIS — Z0100 Encounter for examination of eyes and vision without abnormal findings: Secondary | ICD-10-CM | POA: Diagnosis not present

## 2020-03-03 NOTE — Progress Notes (Signed)
Subjective:   Cynthia Roth is a 69 y.o. female who presents for an Initial Medicare Annual Wellness Visit.  I connected with [name]  today by telephone and verified that I am speaking with the correct person using two identifiers. Location patient: home Location provider: work Persons participating in the virtual visit: patient, provider.   I discussed the limitations, risks, security and privacy concerns of performing an evaluation and management service by telephone and the availability of in person appointments. I also discussed with the patient that there may be a patient responsible charge related to this service. The patient expressed understanding and verbally consented to this telephonic visit.    Interactive audio and video telecommunications were attempted between this provider and patient, however failed, due to patient having technical difficulties OR patient did not have access to video capability.  We continued and completed visit with audio only.    Review of Systems    N/A Cardiac Risk Factors include: advanced age (>75men, >39 women);hypertension;dyslipidemia     Objective:    Today's Vitals   There is no height or weight on file to calculate BMI.  Advanced Directives 03/03/2020 04/25/2019 12/22/2018 03/17/2017 03/07/2017 07/24/2014 05/10/2012  Does Patient Have a Medical Advance Directive? No No No No No No Patient does not have advance directive;Patient would not like information  Copy of [place] in Chart? - - - No - copy requested No - copy requested - -  Would patient like information on creating a medical advance directive? No - Patient declined No - Patient declined No - Patient declined - - No - patient declined information -  Pre-existing out of facility DNR order (yellow form or pink MOST form) - - - - - - -    Current Medications (verified) Outpatient Encounter Medications as of 03/03/2020  Medication Sig  .  aspirin 500 MG tablet Take 325 mg by mouth daily as needed for pain.   [name] aspirin 81 MG tablet Take 81 mg by mouth daily.  [name] atorvastatin (LIPITOR) 20 MG tablet Take 1 tablet (20 mg total) by mouth daily.  [name] BIDIL 20-37.5 MG tablet TAKE (1/2) TABLET THREE TIMES DAILY.  [name] Bioflavonoid Products (VITAMIN C-BIOFLAVONOIDS) 1000-100 MG TBCR Take 1 tablet by mouth daily.  . Camphor-Eucalyptus-Menthol (VICKS VAPORUB EX) Apply 1 application topically at bedtime.  . carvedilol (COREG) 12.5 MG tablet Take 1 tablet (12.5 mg total) by mouth 2 (two) times daily with a meal. Please call for office visit [phone]  . Cholecalciferol (VITAMIN D3) 250 MCG (10000 UT) TABS Take 1 tablet by mouth every other day.   [name] CINNAMON PO Take 1 capsule by mouth daily.  . ferrous sulfate 325 (65 FE) MG tablet TAKE 1 TABLET BY MOUTH EVERY DAY WITH BREAKFAST  . furosemide (LASIX) 20 MG tablet Take 1 tablet (20 mg total) by mouth every other day.  . Magnesium 250 MG TABS Take 200 mg by mouth daily.   [name] MELATONIN GUMMIES PO Take by mouth at bedtime.  [name] OVER THE COUNTER MEDICATION Apply 1 application topically at bedtime. Sleep balm, apply to face every night  . SUPER B COMPLEX/C PO Take 1 tablet by mouth daily.  [name] lisinopril (ZESTRIL) 10 MG tablet Take 1 tablet (10 mg total) by mouth at bedtime.  [name] spironolactone (ALDACTONE) 25 MG tablet Take 0.5 tablets (12.5 mg total) by mouth at bedtime.   No facility-administered encounter medications on file as of 03/03/2020.    Allergies (  verified) Decongestant [pseudoephedrine hcl er]   History: Past Medical History:  Diagnosis Date  . AICD (automatic cardioverter/defibrillator) present   . Allergy   . Biventricular ICD -Medtronic    DOI 12/13 - Medtronic Viva XR CRT-D defibrillator, serial #[id number].    . CHF (congestive heart failure) ([place])   . Chronic systolic heart failure ([place]) 12/02/2011  . Colon polyps    Dr Mar Daring [facility]  . Dyslipidemia 12/02/2011  . Gout    "very  seldom" (05/10/2012)  . Hx of colonic polyps   . Hyperlipidemia   . Hypertension    "used to have this; now my BP is low" (05/10/2012)  . LBBB (left bundle branch block) 12/02/2011  . Nonischemic cardiomyopathy ([place])   . Pre-diabetes   . Prediabetes   . Presence of permanent cardiac pacemaker   . Tubular adenoma 1999   history of   Past Surgical History:  Procedure Laterality Date  . BI-VENTRICULAR IMPLANTABLE CARDIOVERTER DEFIBRILLATOR N/A 05/10/2012   Procedure: BI-VENTRICULAR IMPLANTABLE CARDIOVERTER DEFIBRILLATOR  (CRT-D);  Surgeon: Deboraha Sprang, MD;  Location: [facility] CATH LAB;  Service: Cardiovascular;  Laterality: N/A;  . BIV ICD GENERATOR CHANGEOUT N/A 12/22/2018   Procedure: BIV ICD GENERATOR CHANGEOUT;  Surgeon: Deboraha Sprang, MD;  Location: [facility] CV LAB;  Service: Cardiovascular;  Laterality: N/A;  . CARDIAC CATHETERIZATION  12-03-11   [facility]ie coronary angiography  . CARDIAC DEFIBRILLATOR PLACEMENT  05/10/2012   CRT-D implantation (05/10/2012)  . COLONOSCOPY WITH PROPOFOL N/A 03/17/2017   Procedure: COLONOSCOPY WITH PROPOFOL;  Surgeon: Juanita Craver, MD;  Location: [facility] ENDOSCOPY;  Service: Endoscopy;  Laterality: N/A;  . LEFT HEART CATHETERIZATION WITH CORONARY ANGIOGRAM N/A 12/03/2011   Procedure: LEFT HEART CATHETERIZATION WITH CORONARY ANGIOGRAM;  Surgeon: Larey Dresser, MD;  Location[facility] C[facility] LAB;  Service: Cardiovascular;  Laterality: N/A;  . PARTIAL HYSTERECTOMY  1980's   Family History  Problem Relation Age of Onset  . Angina Mother        diagnosed at age of 76's, unsure whether it was a true diagnosis. unsure about the treatment  . Dementia Mother   . Sleep apnea Brother   . Kidney disease Father   . Diabetes Father   . Diabetes Brother   . Heart failure Maternal Grandmother        diagnosed in age of 68's. still living at age of 82's.  [name] Heart attack Maternal Grandmother   . Stroke Maternal Grandmother   . Breast cancer Maternal Grandmother   . Colon  cancer Maternal Grandmother   . Colon cancer Other        family history  . Intellectual disability Other   . Hypertension Neg Hx    Social History   Socioeconomic History  . Marital status: Divorced    Spouse name: n/a  . Number of children: 1  . Years of education: Master's  . Highest education level: Not on file  Occupational History  . Occupation: Conservator, museum/gallery  Tobacco Use  . Smoking status: Never Smoker  . Smokeless tobacco: Never Used  Vaping Use  . Vaping Use: Never used  Substance and Sexual Activity  . Alcohol use: No    Alcohol/week: 0.0 standard drinks  . Drug use: No  . Sexual activity: Never  Other Topics Concern  . Not on file  Social History Narrative   Lives in [place], and her mother, grandmother and one brother live with her. Divorced  with one adult son who lives in [place], [place]. One brother lives in [place], [place]. Another lives in [place]. 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   Social Determinants of Health   Financial Resource Strain: Low Risk   . Difficulty of Paying Living Expenses: Not hard at all  Food Insecurity: No Food Insecurity  . Worried About Charity fundraiser in the Last Year: Never true  . Ran Out of Food in the Last Year: Never true  Transportation Needs: No Transportation Needs  . Lack of Transportation (Medical): No  . Lack of Transportation (Non-Medical): No  Physical Activity: Inactive  . Days of Exercise per Week: 0 days  . Minutes of Exercise per Session: 0 min  Stress: No Stress Concern Present  . Feeling of Stress : Not at all  Social Connections: Moderately Integrated  . Frequency of Communication with Friends and Family: More than three times a week  . Frequency of Social Gatherings with Friends and Family: More than three times a week  . Attends Religious Services: More than 4 times per year  . Active Member of Clubs or Organizations: Yes    . Attends Archivist Meetings: More than 4 times per year  . Marital Status: Divorced    Tobacco Counseling Counseling given: Not Answered   Clinical Intake:  Pre-visit preparation completed: Yes  Pain : No/denies pain     Nutritional Risks: None Diabetes: No  How often do you need to have someone help you when you read instructions, pamphlets, or other written materials from your doctor or pharmacy?: 1 - Never What is the last grade level you completed in school?: Graduate School  Diabetic?No  Interpreter Needed?: No  Information entered by :: [place] of Daily Living In your present state of health, do you have any difficulty performing the following activities: 03/03/2020  Hearing? N  Vision? Y  Comment has had some issues seeing even with glasses  Difficulty concentrating or making decisions? Y  Comment occassional has issues with brain fog  Walking or climbing stairs? Y  Comment Has knee pain  Dressing or bathing? N  Doing errands, shopping? N  Preparing Food and eating ? N  Using the Toilet? N  In the past six months, have you accidently leaked urine? N  Do you have problems with loss of bowel control? N  Managing your Medications? N  Managing your Finances? N  Housekeeping or managing your Housekeeping? N  Some recent data might be hidden    Patient Care Team: [name], MD as PCP - General (Family Medicine) Skeet Latch, MD as PCP - Cardiology (Cardiology) Deboraha Sprang, MD as PCP - Electrophysiology (Cardiology) Larey Dresser, MD as Consulting Physician (Cardiology) Juanita Craver, MD as Consulting Physician (Gastroenterology)  Indicate any recent Medical Services you may have received from other than [facility] providers in the past year (date may be approximate).     Assessment:   This is a routine wellness examination for Yamilka.  Hearing/Vision screen  Hearing Screening   125Hz  250Hz  500Hz  1000Hz  2000Hz   3000Hz  4000Hz  6000Hz  8000Hz   Right ear:           Left ear:           Vision Screening Comments: Patient states has not had eyes checked in greater than 10 years   Dietary issues and exercise activities discussed: Current Exercise Habits: The patient does not participate in regular exercise  at present  Goals    . Weight (lb) < 150 lb (68 kg)      Depression Screen PHQ 2/9 Scores 03/03/2020 02/27/2020 09/03/2019 01/24/2017 10/27/2016  PHQ - 2 Score 0 0 6 2 1   PHQ- 9 Score 0 - 18 6 -    Fall Risk Fall Risk  03/03/2020 02/27/2020 10/19/2016  Falls in the past year? 1 1 No  Number falls in past yr: 0 0 -  Injury with Fall? 0 1 -  Comment has bruising to arm and abdomen Skin bruises on the arm -  Risk for fall due to : History of fall(s) - -  Follow up Falls evaluation completed;Falls prevention discussed - -    Any stairs in or around the home? Yes  If so, are there any without handrails? No  Home free of loose throw rugs in walkways, pet beds, electrical cords, etc? Yes  Adequate lighting in your home to reduce risk of falls? Yes   ASSISTIVE DEVICES UTILIZED TO PREVENT FALLS:  Life alert? No  Use of a cane, walker or w/c? No  Grab bars in the bathroom? No  Shower chair or bench in shower? No  Elevated toilet seat or a handicapped toilet? No    Cognitive Function:     6CIT Screen 03/03/2020  What Year? 0 points  What month? 0 points  What time? 0 points  Count back from 20 0 points  Months in reverse 0 points  Repeat phrase 0 points  Total Score 0    Immunizations Immunization History  Administered Date(s) Administered  . Fluad Quad(high Dose 65+) 02/22/2019  . PFIZER SARS-COV-2 Vaccination 07/02/2019, 07/23/2019  . Pneumococcal Conjugate-13 02/27/2020    TDAP status: Due, Education has been provided regarding the importance of this vaccine. Advised may receive this vaccine at local pharmacy or Health Dept. Aware to provide a copy of the vaccination record if obtained  from local pharmacy or Health Dept. Verbalized acceptance and understanding. Flu Vaccine status: Declined, Education has been provided regarding the importance of this vaccine but patient still declined. Advised may receive this vaccine at local pharmacy or Health Dept. Aware to provide a copy of the vaccination record if obtained from local pharmacy or Health Dept. Verbalized acceptance and understanding. Pneumococcal vaccine status: Up to date Covid-19 vaccine status: Completed vaccines  Qualifies for Shingles Vaccine? Yes   Zostavax completed No   Shingrix Completed?: No.    Education has been provided regarding the importance of this vaccine. Patient has been advised to call insurance company to determine out of pocket expense if they have not yet received this vaccine. Advised may also receive vaccine at local pharmacy or Health Dept. Verbalized acceptance and understanding.  Screening Tests Health Maintenance  Topic Date Due  . TETANUS/TDAP  Never done  . DEXA SCAN  Never done  . INFLUENZA VACCINE  12/30/2019  . PNA vac Low Risk Adult (2 of 2 - PPSV23) 02/26/2021  . MAMMOGRAM  05/06/2021  . COLONOSCOPY  03/18/2027  . COVID-19 Vaccine  Completed  . Hepatitis C Screening  Completed    Health Maintenance  Health Maintenance Due  Topic Date Due  . TETANUS/TDAP  Never done  . DEXA SCAN  Never done  . INFLUENZA VACCINE  12/30/2019     Colorectal cancer screening: Completed 03/17/2017. Repeat every 10 years Mammogram status: Completed 05/07/2019. Repeat every year Bone Density status: Ordered 02/27/2020. Pt provided with contact info and advised to call to schedule appt.  Lung Cancer Screening: (Low Dose CT Chest recommended if Age 34-80 years, 30 pack-year currently smoking OR have quit w/in 15years.) does not qualify.   Lung Cancer Screening Referral: N/A   Additional Screening:  Hepatitis C Screening: does qualify; Completed 02/27/2020  Vision Screening: Recommended  annual ophthalmology exams for early detection of glaucoma and other disorders of the eye. Is the patient up to date with their annual eye exam?  No  Who is the provider or what is the name of the office in which the patient attends annual eye exams? Patient does not have an eye provider at this time  If pt is not established with a provider, would they like to be referred to a provider to establish care? Yes .   Dental Screening: Recommended annual dental exams for proper oral hygiene  Community Resource Referral / Chronic Care Management: CRR required this visit?  No   CCM required this visit?  No      Plan:     I have personally reviewed and noted the following in the patient's chart:   . Medical and social history . Use of alcohol, tobacco or illicit drugs  . Current medications and supplements . Functional ability and status . Nutritional status . Physical activity . Advanced directives . List of other physicians . Hospitalizations, surgeries, and ER visits in previous 12 months . Vitals . Screenings to include cognitive, depression, and falls . Referrals and appointments  In addition, I have reviewed and discussed with patient certain preventive protocols, quality metrics, and best practice recommendations. A written personalized care plan for preventive services as well as general preventive health recommendations were provided to patient.     Ofilia Neas, LPN   29/09/2839   Nurse Notes: None

## 2020-03-03 NOTE — Patient Instructions (Signed)
Cynthia Roth , Thank you for taking time to come for your Medicare Wellness Visit. I appreciate your ongoing commitment to your health goals. Please review the following plan we discussed and let me know if I can assist you in the future.   Screening recommendations/referrals: Colonoscopy: Up to date, next due 03/17/2022 Mammogram: Up to date, next due 05/06/2020 Bone Density: Currently due, was ordered on 02/27/2020 please call and get scheduled Recommended yearly ophthalmology/optometry visit for glaucoma screening and checkup Recommended yearly dental visit for hygiene and checkup  Vaccinations: Influenza vaccine: Currently due, you may receive in our office or at your local pharmacy.  Pneumococcal vaccine: Completed series Tdap vaccine: currently due, please contact your insurance to discuss cost or you may await and injury to receive  Shingles vaccine: Currently due, please contact your pharmacy to discuss cost and to receive     Advanced directives: Advance directive discussed with you today. Even though you declined this today please call our office should you change your mind and we can give you the proper paperwork for you to fill out.   Conditions/risks identified: None   Next appointment: None    Preventive Care 65 Years and Older, Female Preventive care refers to lifestyle choices and visits with your health care provider that can promote health and wellness. What does preventive care include?  A yearly physical exam. This is also called an annual well check.  Dental exams once or twice a year.  Routine eye exams. Ask your health care provider how often you should have your eyes checked.  Personal lifestyle choices, including:  Daily care of your teeth and gums.  Regular physical activity.  Eating a healthy diet.  Avoiding tobacco and drug use.  Limiting alcohol use.  Practicing safe sex.  Taking low-dose aspirin every day.  Taking vitamin and mineral  supplements as recommended by your health care provider. What happens during an annual well check? The services and screenings done by your health care provider during your annual well check will depend on your age, overall health, lifestyle risk factors, and family history of disease. Counseling  Your health care provider may ask you questions about your:  Alcohol use.  Tobacco use.  Drug use.  Emotional well-being.  Home and relationship well-being.  Sexual activity.  Eating habits.  History of falls.  Memory and ability to understand (cognition).  Work and work Statistician.  Reproductive health. Screening  You may have the following tests or measurements:  Height, weight, and BMI.  Blood pressure.  Lipid and cholesterol levels. These may be checked every 5 years, or more frequently if you are over 38 years old.  Skin check.  Lung cancer screening. You may have this screening every year starting at age 22 if you have a 30-pack-year history of smoking and currently smoke or have quit within the past 15 years.  Fecal occult blood test (FOBT) of the stool. You may have this test every year starting at age 56.  Flexible sigmoidoscopy or colonoscopy. You may have a sigmoidoscopy every 5 years or a colonoscopy every 10 years starting at age 72.  Hepatitis C blood test.  Hepatitis B blood test.  Sexually transmitted disease (STD) testing.  Diabetes screening. This is done by checking your blood sugar (glucose) after you have not eaten for a while (fasting). You may have this done every 1-3 years.  Bone density scan. This is done to screen for osteoporosis. You may have this done starting at age  65.  Mammogram. This may be done every 1-2 years. Talk to your health care provider about how often you should have regular mammograms. Talk with your health care provider about your test results, treatment options, and if necessary, the need for more tests. Vaccines  Your  health care provider may recommend certain vaccines, such as:  Influenza vaccine. This is recommended every year.  Tetanus, diphtheria, and acellular pertussis (Tdap, Td) vaccine. You may need a Td booster every 10 years.  Zoster vaccine. You may need this after age 43.  Pneumococcal 13-valent conjugate (PCV13) vaccine. One dose is recommended after age 93.  Pneumococcal polysaccharide (PPSV23) vaccine. One dose is recommended after age 60. Talk to your health care provider about which screenings and vaccines you need and how often you need them. This information is not intended to replace advice given to you by your health care provider. Make sure you discuss any questions you have with your health care provider. Document Released: 06/13/2015 Document Revised: 02/04/2016 Document Reviewed: 03/18/2015 Elsevier Interactive Patient Education  2017 [employer] Prevention in the Home Falls can cause injuries. They can happen to people of all ages. There are many things you can do to make your home safe and to help prevent falls. What can I do on the outside of my home?  Regularly fix the edges of walkways and driveways and fix any cracks.  Remove anything that might make you trip as you walk through a door, such as a raised step or threshold.  Trim any bushes or trees on the path to your home.  Use bright outdoor lighting.  Clear any walking paths of anything that might make someone trip, such as rocks or tools.  Regularly check to see if handrails are loose or broken. Make sure that both sides of any steps have handrails.  Any raised decks and porches should have guardrails on the edges.  Have any leaves, snow, or ice cleared regularly.  Use sand or salt on walking paths during winter.  Clean up any spills in your garage right away. This includes oil or grease spills. What can I do in the bathroom?  Use night lights.  Install grab bars by the toilet and in the tub and  shower. Do not use towel bars as grab bars.  Use non-skid mats or decals in the tub or shower.  If you need to sit down in the shower, use a plastic, non-slip stool.  Keep the floor dry. Clean up any water that spills on the floor as soon as it happens.  Remove soap buildup in the tub or shower regularly.  Attach bath mats securely with double-sided non-slip rug tape.  Do not have throw rugs and other things on the floor that can make you trip. What can I do in the bedroom?  Use night lights.  Make sure that you have a light by your bed that is easy to reach.  Do not use any sheets or blankets that are too big for your bed. They should not hang down onto the floor.  Have a firm chair that has side arms. You can use this for support while you get dressed.  Do not have throw rugs and other things on the floor that can make you trip. What can I do in the kitchen?  Clean up any spills right away.  Avoid walking on wet floors.  Keep items that you use a lot in easy-to-reach places.  If you need  to reach something above you, use a strong step stool that has a grab bar.  Keep electrical cords out of the way.  Do not use floor polish or wax that makes floors slippery. If you must use wax, use non-skid floor wax.  Do not have throw rugs and other things on the floor that can make you trip. What can I do with my stairs?  Do not leave any items on the stairs.  Make sure that there are handrails on both sides of the stairs and use them. Fix handrails that are broken or loose. Make sure that handrails are as long as the stairways.  Check any carpeting to make sure that it is firmly attached to the stairs. Fix any carpet that is loose or worn.  Avoid having throw rugs at the top or bottom of the stairs. If you do have throw rugs, attach them to the floor with carpet tape.  Make sure that you have a light switch at the top of the stairs and the bottom of the stairs. If you do not  have them, ask someone to add them for you. What else can I do to help prevent falls?  Wear shoes that:  Do not have high heels.  Have rubber bottoms.  Are comfortable and fit you well.  Are closed at the toe. Do not wear sandals.  If you use a stepladder:  Make sure that it is fully opened. Do not climb a closed stepladder.  Make sure that both sides of the stepladder are locked into place.  Ask someone to hold it for you, if possible.  Clearly mark and make sure that you can see:  Any grab bars or handrails.  First and last steps.  Where the edge of each step is.  Use tools that help you move around (mobility aids) if they are needed. These include:  Canes.  Walkers.  Scooters.  Crutches.  Turn on the lights when you go into a dark area. Replace any light bulbs as soon as they burn out.  Set up your furniture so you have a clear path. Avoid moving your furniture around.  If any of your floors are uneven, fix them.  If there are any pets around you, be aware of where they are.  Review your medicines with your doctor. Some medicines can make you feel dizzy. This can increase your chance of falling. Ask your doctor what other things that you can do to help prevent falls. This information is not intended to replace advice given to you by your health care provider. Make sure you discuss any questions you have with your health care provider. Document Released: 03/13/2009 Document Revised: 10/23/2015 Document Reviewed: 06/21/2014 Elsevier Interactive Patient Education  2017 [employer].

## 2020-03-22 ENCOUNTER — Other Ambulatory Visit: Payer: Self-pay | Admitting: Internal Medicine

## 2020-04-21 ENCOUNTER — Ambulatory Visit (INDEPENDENT_AMBULATORY_CARE_PROVIDER_SITE_OTHER): Payer: Medicare Other

## 2020-04-21 DIAGNOSIS — I428 Other cardiomyopathies: Secondary | ICD-10-CM

## 2020-04-21 DIAGNOSIS — I5042 Chronic combined systolic (congestive) and diastolic (congestive) heart failure: Secondary | ICD-10-CM

## 2020-04-21 LAB — CUP PACEART REMOTE DEVICE CHECK
Battery Remaining Longevity: 51 mo
Battery Voltage: 2.97 V
Brady Statistic RV Percent Paced: 5.56 %
Date Time Interrogation Session: 20211122024237
HighPow Impedance: 73 Ohm
Implantable Lead Implant Date: 20131211
Implantable Lead Implant Date: 20131211
Implantable Lead Implant Date: 20131211
Implantable Lead Location: 753858
Implantable Lead Location: 753859
Implantable Lead Location: 753860
Implantable Lead Model: 181
Implantable Lead Model: 4396
Implantable Lead Model: 5076
Implantable Lead Serial Number: 32342
Implantable Pulse Generator Implant Date: 20200724
Lead Channel Impedance Value: 342 Ohm
Lead Channel Impedance Value: 475 Ohm
Lead Channel Impedance Value: 513 Ohm
Lead Channel Impedance Value: 589 Ohm
Lead Channel Impedance Value: 589 Ohm
Lead Channel Impedance Value: 988 Ohm
Lead Channel Pacing Threshold Amplitude: 0.625 V
Lead Channel Pacing Threshold Amplitude: 0.625 V
Lead Channel Pacing Threshold Amplitude: 2.375 V
Lead Channel Pacing Threshold Pulse Width: 0.4 ms
Lead Channel Pacing Threshold Pulse Width: 0.4 ms
Lead Channel Pacing Threshold Pulse Width: 1.5 ms
Lead Channel Sensing Intrinsic Amplitude: 10.3 mV
Lead Channel Sensing Intrinsic Amplitude: 2.9 mV
Lead Channel Setting Pacing Amplitude: 1.5 V
Lead Channel Setting Pacing Amplitude: 2.5 V
Lead Channel Setting Pacing Amplitude: 3 V
Lead Channel Setting Pacing Pulse Width: 0.4 ms
Lead Channel Setting Pacing Pulse Width: 1.5 ms
Lead Channel Setting Sensing Sensitivity: 0.45 mV

## 2020-04-22 NOTE — Progress Notes (Signed)
Remote ICD transmission.   

## 2020-05-05 ENCOUNTER — Other Ambulatory Visit: Payer: Self-pay | Admitting: Family Medicine

## 2020-05-05 DIAGNOSIS — Z1231 Encounter for screening mammogram for malignant neoplasm of breast: Secondary | ICD-10-CM

## 2020-05-07 ENCOUNTER — Encounter: Payer: Self-pay | Admitting: Family Medicine

## 2020-05-07 DIAGNOSIS — H25813 Combined forms of age-related cataract, bilateral: Secondary | ICD-10-CM | POA: Diagnosis not present

## 2020-05-07 DIAGNOSIS — H3562 Retinal hemorrhage, left eye: Secondary | ICD-10-CM | POA: Diagnosis not present

## 2020-05-07 DIAGNOSIS — H35033 Hypertensive retinopathy, bilateral: Secondary | ICD-10-CM | POA: Diagnosis not present

## 2020-05-07 DIAGNOSIS — R7309 Other abnormal glucose: Secondary | ICD-10-CM | POA: Diagnosis not present

## 2020-05-19 DIAGNOSIS — H3562 Retinal hemorrhage, left eye: Secondary | ICD-10-CM | POA: Diagnosis not present

## 2020-05-19 DIAGNOSIS — H35012 Changes in retinal vascular appearance, left eye: Secondary | ICD-10-CM | POA: Diagnosis not present

## 2020-05-19 DIAGNOSIS — H3582 Retinal ischemia: Secondary | ICD-10-CM | POA: Diagnosis not present

## 2020-05-19 DIAGNOSIS — H348321 Tributary (branch) retinal vein occlusion, left eye, with retinal neovascularization: Secondary | ICD-10-CM | POA: Diagnosis not present

## 2020-05-20 ENCOUNTER — Telehealth: Payer: Self-pay | Admitting: Family Medicine

## 2020-05-20 ENCOUNTER — Other Ambulatory Visit: Payer: Self-pay | Admitting: Internal Medicine

## 2020-05-20 MED ORDER — LISINOPRIL 10 MG PO TABS: 10.0000 mg | ORAL_TABLET | Freq: Every day | ORAL | 3 refills | Status: DC

## 2020-05-20 NOTE — Telephone Encounter (Signed)
Rx sent in

## 2020-05-20 NOTE — Telephone Encounter (Signed)
Patient is calling and requesting a refill lisinopril (ZESTRIL) 10 MG tablet sent to [id number]   [place], [place] [address]  Phone:  [phone] Fax:  [phone] CB is [phone]

## 2020-06-01 ENCOUNTER — Other Ambulatory Visit: Payer: Self-pay | Admitting: Internal Medicine

## 2020-06-11 ENCOUNTER — Encounter: Payer: Self-pay | Admitting: Cardiovascular Disease

## 2020-06-11 ENCOUNTER — Other Ambulatory Visit: Payer: Self-pay

## 2020-06-11 ENCOUNTER — Ambulatory Visit: Payer: Medicare Other | Admitting: Cardiovascular Disease

## 2020-06-11 VITALS — BP 115/62 | HR 68 | Temp 96.4°F | Ht 64.0 in | Wt 194.8 lb

## 2020-06-11 DIAGNOSIS — I10 Essential (primary) hypertension: Secondary | ICD-10-CM | POA: Diagnosis not present

## 2020-06-11 DIAGNOSIS — I5022 Chronic systolic (congestive) heart failure: Secondary | ICD-10-CM

## 2020-06-11 DIAGNOSIS — I447 Left bundle-branch block, unspecified: Secondary | ICD-10-CM

## 2020-06-11 DIAGNOSIS — Z6833 Body mass index (BMI) 33.0-33.9, adult: Secondary | ICD-10-CM | POA: Diagnosis not present

## 2020-06-11 DIAGNOSIS — E66811 Obesity, class 1: Secondary | ICD-10-CM | POA: Insufficient documentation

## 2020-06-11 DIAGNOSIS — E785 Hyperlipidemia, unspecified: Secondary | ICD-10-CM

## 2020-06-11 DIAGNOSIS — E669 Obesity, unspecified: Secondary | ICD-10-CM

## 2020-06-11 DIAGNOSIS — I428 Other cardiomyopathies: Secondary | ICD-10-CM

## 2020-06-11 HISTORY — DX: Obesity, class 1: E66.811

## 2020-06-11 HISTORY — DX: Obesity, unspecified: E66.9

## 2020-06-11 NOTE — Patient Instructions (Addendum)
Medication Instructions:  Your physician recommends that you continue on your current medications as directed. Please refer to the Current Medication list given to you today.  *If you need a refill on your cardiac medications before your next appointment, please call your pharmacy*  Lab Work: NONE   Testing/Procedures: NONE  Follow-Up: At [facility], you and your health needs are our priority.  As part of our continuing mission to provide you with exceptional heart care, we have created designated Provider Care Teams.  These Care Teams include your primary Cardiologist (physician) and Advanced Practice Providers (APPs -  Physician Assistants and Nurse Practitioners) who all work together to provide you with the care you need, when you need it.  We recommend signing up for the patient portal called "MyChart".  Sign up information is provided on this After Visit Summary.  MyChart is used to connect with patients for Virtual Visits (Telemedicine).  Patients are able to view lab/test results, encounter notes, upcoming appointments, etc.  Non-urgent messages can be sent to your provider as well.   To learn more about what you can do with MyChart, go to [email or web address].    Your next appointment:   6 month(s)  You will receive a reminder letter in the mail two months in advance. If you don't receive a letter, please call our office to schedule the follow-up appointment.  The format for your next appointment:   In Person  Provider:   You may see Skeet Latch, MD or one of the following Advanced Practice Providers on your designated Care Team:    Kerin Ransom, PA-C  [place], [place]  Coletta Memos, [place]  You have been referred to   Ambulatory referral to Nutrition and Diabetic Education  Where: [facility] Address: [address] [place] [address] Phone: [phone]  IF YOU DO NOT HEAR FROM NUTRITION IN 2 WEEKS CALL THE  NUMBER ABOVE  SOMEONE FROM THE PREP TEAM ([place]) [place]. IF YOU DO NOT HEAR FROM THEM IN 2 WEEKS CALL THE OFFICE TO FOLLOW UP

## 2020-06-11 NOTE — Progress Notes (Signed)
Cardiology Office Note   Date:  06/11/2020   ID:  December, Hedtke 1951-04-04, MRN [id number]  PCP:  [name], MD  Cardiologist:   Skeet Latch, MD  Electrophysiologist: Dr. Caryl Comes  No chief complaint on file.     History of Present Illness: Cynthia Roth is a 70 y.o. female with chronic systolic and diastolic heart failure resolved after CRT-D, CAD, hypertension and LBBB here to establish care.  She was initially a patient of Dr. [name]. She was admitted to [facility] 11/2011 with acute systolic and diastolic heart failure.  Her left ventricle was severely dilated and  LVEF was 15%.  She also had a LBBB.  LHC at that time showed mild, non-obstructive CAD.  Repeat echo 3 months later showed persistently depressed LV functoin.  She had a CRT-D implanted 04/2012.  Follow up echo 3/204 showed LVEF 20% with mild-moderate MR and normal RV function.  Repeat echo 06/2015 showed an improvement to 55-60% and it was 50-55% 82018.  She last saw Dr. [name] 12/2016 and has been discharged from HF clinic given her clinical improvement.    [name] has been feeling well.  She does Zumba 3 days per week.  She has no chest pain or shortness of breath with exertion.  She reports feeling drowsy all the time.  She sleeps approximately 5 hours per night.  She wakes up  2-3 times per night to urinate.  She is unsure whether she snores.  She doesn't feel rested in the mornings and she falls asleep easily during the day.  She sometimes has palpitations when lying down but not throughout the day.  She does get lightheaded 4-5 times per week.  This typically occurs with positional changes.  She denies syncope.  Lisinopril was reduced from 40mg  to 20mg  due to hypotension.  She has very mild edema but no orthopnea or PND.   At her last appointment lisinopril was reduced due to hypotension.  This did improve her hypotension and dizziness.  She followed up with EP and general cardiology  on 10/2019 and was doing well.  She is frustrated that she can't lose weight.  She is unsure what to eat.  She doesn't eat much and only eats one meal to day.  She has been busy helping to care for family member and not exercising.  She feels tired and has struggled with sleep.  She has been feeling depressed at times.  She fell down 4-5 steps outside her house.  She was carrying garbage bags and tripped.  She hd bad scrapes but no broken bones.  She has an occasional twinge of chest pain when laying down in bed.  It occurs when she is feeling stressed and anxious.  She is working with Dr. Volanda Napoleon about this.   Past Medical History:  Diagnosis Date  . AICD (automatic cardioverter/defibrillator) present   . Allergy   . Biventricular ICD -Medtronic    DOI 12/13 - Medtronic Viva XR CRT-D defibrillator, serial #[id number].    . CHF (congestive heart failure) ([place])   . Chronic systolic heart failure ([place]) 12/02/2011  . Colon polyps    Dr Mar Daring [facility]  . Dyslipidemia 12/02/2011  . Gout    "very seldom" (05/10/2012)  . Hx of colonic polyps   . Hyperlipidemia   . Hypertension    "used to have this; now my BP is low" (05/10/2012)  . LBBB (left bundle branch block) 12/02/2011  . Nonischemic  cardiomyopathy ([place])   . Obesity (BMI 30.0-34.9) 06/11/2020  . Pre-diabetes   . Prediabetes   . Presence of permanent cardiac pacemaker   . Tubular adenoma 1999   history of    Past Surgical History:  Procedure Laterality Date  . BI-VENTRICULAR IMPLANTABLE CARDIOVERTER DEFIBRILLATOR N/A 05/10/2012   Procedure: BI-VENTRICULAR IMPLANTABLE CARDIOVERTER DEFIBRILLATOR  (CRT-D);  Surgeon: Deboraha Sprang, MD;  Location: [facility] CATH LAB;  Service: Cardiovascular;  Laterality: N/A;  . BIV ICD GENERATOR CHANGEOUT N/A 12/22/2018   Procedure: BIV ICD GENERATOR CHANGEOUT;  Surgeon: Deboraha Sprang, MD;  Location: [facility] LAB;  Service: Cardiovascular;  Laterality: N/A;  . CARDIAC CATHETERIZATION  12-03-11   selectie  coronary angiography  . CARDIAC DEFIBRILLATOR PLACEMENT  05/10/2012   CRT-D implantation (05/10/2012)  . COLONOSCOPY WITH PROPOFOL N/A 03/17/2017   Procedure: COLONOSCOPY WITH PROPOFOL;  Surgeon: Juanita Craver, MD;  Location: [facility] ENDOSCOPY;  Service: Endoscopy;  Laterality: N/A;  . LEFT HEART CATHETERIZATION WITH CORONARY ANGIOGRAM N/A 12/03/2011   Procedure: LEFT HEART CATHETERIZATION WITH CORONARY ANGIOGRAM;  Surgeon: Larey Dresser, MD;  Location: [facility] CATH LAB;  Service: Cardiovascular;  Laterality: N/A;  . PARTIAL HYSTERECTOMY  1980's     Current Outpatient Medications  Medication Sig Dispense Refill  . aspirin 81 MG tablet Take 81 mg by mouth daily.    [name] atorvastatin (LIPITOR) 20 MG tablet TAKE 1 TABLET BY MOUTH EVERY DAY 90 tablet 1  . Bioflavonoid Products (VITAMIN C-BIOFLAVONOIDS) 1000-100 MG TBCR Take 1 tablet by mouth daily.    . Camphor-Eucalyptus-Menthol (VICKS VAPORUB EX) Apply 1 application topically at bedtime.    . carvedilol (COREG) 12.5 MG tablet Take 1 tablet (12.5 mg total) by mouth 2 (two) times daily with a meal. Please call for office visit [phone] 180 tablet 1  . Cholecalciferol (VITAMIN D3) 250 MCG (10000 UT) TABS Take 1 tablet by mouth every other day.     [name] CINNAMON PO Take 1 capsule by mouth daily.    . ferrous sulfate 325 (65 FE) MG tablet TAKE 1 TABLET BY MOUTH EVERY DAY WITH BREAKFAST 90 tablet 3  . furosemide (LASIX) 20 MG tablet Take 1 tablet (20 mg total) by mouth every other day. 15 tablet 9  . lisinopril (ZESTRIL) 10 MG tablet Take 1 tablet (10 mg total) by mouth at bedtime. 90 tablet 3  . Magnesium 250 MG TABS Take 200 mg by mouth daily.     [name] MELATONIN GUMMIES PO Take by mouth at bedtime.    [name] OVER THE COUNTER MEDICATION Apply 1 application topically at bedtime. Sleep balm, apply to face every night    . spironolactone (ALDACTONE) 25 MG tablet TAKE 1/2 TABLET BY MOUTH AT BEDTIME 45 tablet 2  . SUPER B COMPLEX/C PO Take 1 tablet by mouth daily.    [name]  BIDIL 20-37.5 MG tablet TAKE (1/2) TABLET THREE TIMES DAILY. 135 tablet 2   No current facility-administered medications for this visit.    Allergies:   Decongestant [pseudoephedrine hcl er]    Social History:  The patient  reports that she has never smoked. She has never used smokeless tobacco. She reports that she does not drink alcohol and does not use drugs.   Family History:  The patient's family history includes Angina in her mother; Breast cancer in her maternal grandmother; Colon cancer in her maternal grandmother and another family member; Dementia in her mother; Diabetes in her brother and father; Heart attack in her maternal grandmother; Heart  failure in her maternal grandmother; Intellectual disability in an other family member; Kidney disease in her father; Sleep apnea in her brother; Stroke in her maternal grandmother.    ROS:  Please see the history of present illness.   Otherwise, review of systems are positive for none.   All other systems are reviewed and negative.    PHYSICAL EXAM: VS:  BP 115/62   Pulse 68   Temp (!) 96.4 F (35.8 C)   Ht 5\' 4"  (1.626 m)   Wt 194 lb 12.8 oz (88.4 kg)   SpO2 97%   BMI 33.44 kg/m  , BMI Body mass index is 33.44 kg/m. GENERAL:  Well appearing HEENT:  Pupils equal round and reactive, fundi not visualized, oral mucosa unremarkable NECK:  No jugular venous distention, waveform within normal limits, carotid upstroke brisk and symmetric, no bruits, no thyromegaly LYMPHATICS:  No cervical adenopathy LUNGS:  Clear to auscultation bilaterally HEART:  RRR.  PMI not displaced or sustained,S1 and S2 within normal limits, no S3, no S4, no clicks, no rubs, no murmurs ABD:  Flat, positive bowel sounds normal in frequency in pitch, no bruits, no rebound, no guarding, no midline pulsatile mass, no hepatomegaly, no splenomegaly EXT:  2 plus pulses throughout, no edema, no cyanosis no clubbing SKIN:  No rashes no nodules NEURO:  Cranial nerves II  through XII grossly intact, motor grossly intact throughout PSYCH:  Cognitively intact, oriented to person place and time    EKG:  EKG is not ordered today.  Echo 01/18/17:  Study Conclusions  - Left ventricle: The cavity size was normal. Wall thickness was   normal. Systolic function was normal. The estimated ejection   fraction was in the range of 50% to 55%. Wall motion was normal;   there were no regional wall motion abnormalities. Features are   consistent with a pseudonormal left ventricular filling pattern,   with concomitant abnormal relaxation and increased filling   pressure (grade 2 diastolic dysfunction). - Mitral valve: There was mild regurgitation.  Echo 11/2011:  LVEF 15%.  Diffuse hypokinesis.  Moderate mitral regurgitation.  IVC dilated with normal respirophasic variation.  Recent Labs: 02/27/2020: BUN 19; Creat 1.46; Hemoglobin 10.1; Platelets 284; Potassium 4.3; Sodium 140    Lipid Panel    Component Value Date/Time   CHOL 142 02/27/2020 0845   TRIG 91 02/27/2020 0845   HDL 42 (L) 02/27/2020 0845   CHOLHDL 3.4 02/27/2020 0845   VLDL 12.0 02/22/2019 1350   LDLCALC 81 02/27/2020 0845      Wt Readings from Last 3 Encounters:  06/11/20 194 lb 12.8 oz (88.4 kg)  02/27/20 197 lb 12.8 oz (89.7 kg)  11/14/19 196 lb (88.9 kg)      ASSESSMENT AND PLAN:  # Chronic systolic and diastolic heart failure: LVEF improved to 50 to 55% 12/2016.  She has a CRT-D device that is followed by Dr. Caryl Comes and functioning well.  She has no heart failure symptoms.  She is euvolemic on exam.  Continue carvedilol, BiDil, lisinopril, spironolactone, and furosemide.  Renal function stable 01/2020.  # Hypertension: BP well controlled.  Continue BiDil, carvedilol, lisinopril, and spironolactone.  # Obesity: Ms. Larance has been struggling with weight loss.  She is not really clear on what she should be eating.  We will refer her to a nutritionist.  We will also have her enroll in  the PREP exercise program through [facility] for education and exercise training.  We did discuss that weight  training would help increase her muscle mass, improve her stability, and raise her basal metabolic rate.  She expressed understanding.  # CAD: No symptoms of ischemia.  Continue aspirin, atorvastatin, carvedilol.  LDL was slightly above goal 01/2020.  Working on diet and exercise as above is likely to get her under 70, which is her goal.   Current medicines are reviewed at length with the patient today.  The patient does not have concerns regarding medicines.  The following changes have been made: None  Labs/ tests ordered today include:   Orders Placed This Encounter  Procedures  . Ambulatory referral to Nutrition and Diabetic Education     Disposition:   FU with Raeqwon Lux C. Oval Linsey, MD, [facility] in 6 months.      Signed, Tania Steinhauser C. Oval Linsey, MD, [facility]  06/11/2020 10:33 AM    [facility]

## 2020-06-19 ENCOUNTER — Other Ambulatory Visit: Payer: Self-pay | Admitting: Family Medicine

## 2020-06-19 DIAGNOSIS — Z13820 Encounter for screening for osteoporosis: Secondary | ICD-10-CM

## 2020-06-19 DIAGNOSIS — E2839 Other primary ovarian failure: Secondary | ICD-10-CM

## 2020-06-24 ENCOUNTER — Other Ambulatory Visit: Payer: Self-pay

## 2020-06-24 ENCOUNTER — Ambulatory Visit
Admission: RE | Admit: 2020-06-24 | Discharge: 2020-06-24 | Disposition: A | Discharge status: Home / Self Care | Payer: Medicare Other | Source: Ambulatory Visit | Attending: Family Medicine | Admitting: Family Medicine

## 2020-06-24 DIAGNOSIS — E2839 Other primary ovarian failure: Secondary | ICD-10-CM

## 2020-06-24 DIAGNOSIS — Z13820 Encounter for screening for osteoporosis: Secondary | ICD-10-CM

## 2020-06-24 DIAGNOSIS — Z1231 Encounter for screening mammogram for malignant neoplasm of breast: Secondary | ICD-10-CM | POA: Diagnosis not present

## 2020-06-24 DIAGNOSIS — Z780 Asymptomatic menopausal state: Secondary | ICD-10-CM | POA: Diagnosis not present

## 2020-06-24 LAB — HM MAMMOGRAPHY

## 2020-06-27 ENCOUNTER — Other Ambulatory Visit (HOSPITAL_COMMUNITY): Payer: Self-pay | Admitting: Internal Medicine

## 2020-07-04 ENCOUNTER — Encounter: Payer: Self-pay | Admitting: Family Medicine

## 2020-07-21 ENCOUNTER — Ambulatory Visit (INDEPENDENT_AMBULATORY_CARE_PROVIDER_SITE_OTHER): Payer: Medicare Other

## 2020-07-21 DIAGNOSIS — I428 Other cardiomyopathies: Secondary | ICD-10-CM | POA: Diagnosis not present

## 2020-07-21 LAB — CUP PACEART REMOTE DEVICE CHECK
Battery Remaining Longevity: 51 mo
Battery Voltage: 2.96 V
Brady Statistic RV Percent Paced: 6.1 %
Date Time Interrogation Session: 20220220200900
HighPow Impedance: 72 Ohm
Implantable Lead Implant Date: 20131211
Implantable Lead Implant Date: 20131211
Implantable Lead Implant Date: 20131211
Implantable Lead Location: 753858
Implantable Lead Location: 753859
Implantable Lead Location: 753860
Implantable Lead Model: 181
Implantable Lead Model: 4396
Implantable Lead Model: 5076
Implantable Lead Serial Number: 32342
Implantable Pulse Generator Implant Date: 20200724
Lead Channel Impedance Value: 323 Ohm
Lead Channel Impedance Value: 475 Ohm
Lead Channel Impedance Value: 570 Ohm
Lead Channel Impedance Value: 627 Ohm
Lead Channel Impedance Value: 627 Ohm
Lead Channel Impedance Value: 931 Ohm
Lead Channel Pacing Threshold Amplitude: 0.625 V
Lead Channel Pacing Threshold Amplitude: 0.75 V
Lead Channel Pacing Threshold Amplitude: 2.375 V
Lead Channel Pacing Threshold Pulse Width: 0.4 ms
Lead Channel Pacing Threshold Pulse Width: 0.4 ms
Lead Channel Pacing Threshold Pulse Width: 1.5 ms
Lead Channel Sensing Intrinsic Amplitude: 2.8 mV
Lead Channel Sensing Intrinsic Amplitude: 9.8 mV
Lead Channel Setting Pacing Amplitude: 1.5 V
Lead Channel Setting Pacing Amplitude: 2.5 V
Lead Channel Setting Pacing Amplitude: 3 V
Lead Channel Setting Pacing Pulse Width: 0.4 ms
Lead Channel Setting Pacing Pulse Width: 1.5 ms
Lead Channel Setting Sensing Sensitivity: 0.45 mV

## 2020-07-24 NOTE — Progress Notes (Signed)
Remote ICD transmission.   

## 2020-09-04 ENCOUNTER — Other Ambulatory Visit: Payer: Self-pay | Admitting: Family Medicine

## 2020-09-04 DIAGNOSIS — D649 Anemia, unspecified: Secondary | ICD-10-CM

## 2020-09-08 ENCOUNTER — Emergency Department (HOSPITAL_COMMUNITY)
Admission: EM | Admit: 2020-09-08 | Discharge: 2020-09-08 | Disposition: A | Discharge status: Home / Self Care | Payer: Medicare Other | Attending: Emergency Medicine | Admitting: Emergency Medicine

## 2020-09-08 ENCOUNTER — Encounter (HOSPITAL_COMMUNITY): Payer: Self-pay | Admitting: Emergency Medicine

## 2020-09-08 ENCOUNTER — Other Ambulatory Visit: Payer: Self-pay

## 2020-09-08 ENCOUNTER — Emergency Department (HOSPITAL_COMMUNITY): Payer: Medicare Other

## 2020-09-08 DIAGNOSIS — S62355A Nondisplaced fracture of shaft of fourth metacarpal bone, left hand, initial encounter for closed fracture: Secondary | ICD-10-CM | POA: Diagnosis not present

## 2020-09-08 DIAGNOSIS — Z7982 Long term (current) use of aspirin: Secondary | ICD-10-CM | POA: Diagnosis not present

## 2020-09-08 DIAGNOSIS — H00011 Hordeolum externum right upper eyelid: Secondary | ICD-10-CM | POA: Diagnosis not present

## 2020-09-08 DIAGNOSIS — Z79899 Other long term (current) drug therapy: Secondary | ICD-10-CM | POA: Insufficient documentation

## 2020-09-08 DIAGNOSIS — S6992XA Unspecified injury of left wrist, hand and finger(s), initial encounter: Secondary | ICD-10-CM | POA: Diagnosis present

## 2020-09-08 DIAGNOSIS — W010XXA Fall on same level from slipping, tripping and stumbling without subsequent striking against object, initial encounter: Secondary | ICD-10-CM | POA: Diagnosis not present

## 2020-09-08 DIAGNOSIS — M7989 Other specified soft tissue disorders: Secondary | ICD-10-CM | POA: Diagnosis not present

## 2020-09-08 DIAGNOSIS — S90112A Contusion of left great toe without damage to nail, initial encounter: Secondary | ICD-10-CM | POA: Insufficient documentation

## 2020-09-08 DIAGNOSIS — Z950 Presence of cardiac pacemaker: Secondary | ICD-10-CM | POA: Insufficient documentation

## 2020-09-08 DIAGNOSIS — I5022 Chronic systolic (congestive) heart failure: Secondary | ICD-10-CM | POA: Diagnosis not present

## 2020-09-08 DIAGNOSIS — Z955 Presence of coronary angioplasty implant and graft: Secondary | ICD-10-CM | POA: Insufficient documentation

## 2020-09-08 DIAGNOSIS — I110 Hypertensive heart disease with heart failure: Secondary | ICD-10-CM | POA: Diagnosis not present

## 2020-09-08 MED ORDER — DOXYCYCLINE HYCLATE 100 MG PO CAPS: 100.0000 mg | ORAL_CAPSULE | Freq: Two times a day (BID) | ORAL | 0 refills | Status: DC

## 2020-09-08 NOTE — ED Provider Notes (Signed)
[place] DEPT Provider Note   CSN: [id number] Arrival date & time: 09/08/20  1443     History Chief Complaint  Patient presents with  . Eye Problem  . Foot Pain    Cynthia Roth is a 70 y.o. female.  70 yo F with a chief complaints of left foot pain and a spot on her right upper eyelid.  Is been going on for a couple weeks.  The patient had an episode where she tripped and inverted her left ankle.  Had some pain and bruising along all of her toes on that side but worst on the great toe.  Has had some persistent pain since and felt she needed to come to get an x-ray performed.  She has had the lesion on her right eyelid for about a week or so.  Mildly uncomfortable.  Sometimes makes her have increased watering.  She denies any fevers or chills denies any drainage.  Denies trauma to the area.  Had seen her ophthalmologist just prior to this happening.  The history is provided by the patient.  Eye Problem Location:  Right eye Quality:  Aching Severity:  Moderate Onset quality:  Gradual Duration:  2 weeks Timing:  Constant Progression:  Partially resolved Chronicity:  New Context: direct trauma   Relieved by:  Nothing Worsened by:  Nothing Ineffective treatments:  None tried Associated symptoms: no headaches, no nausea, no redness and no vomiting   Foot Pain Pertinent negatives include no chest pain, no headaches and no shortness of breath.       Past Medical History:  Diagnosis Date  . AICD (automatic cardioverter/defibrillator) present   . Allergy   . Biventricular ICD -Medtronic    DOI 12/13 - Medtronic Viva XR CRT-D defibrillator, serial #[id number].    . CHF (congestive heart failure) ([place])   . Chronic systolic heart failure ([place]) 12/02/2011  . Colon polyps    Dr Mar Daring [facility]  . Dyslipidemia 12/02/2011  . Gout    "very seldom" (05/10/2012)  . Hx of colonic polyps   . Hyperlipidemia   . Hypertension    "used to have  this; now my BP is low" (05/10/2012)  . LBBB (left bundle branch block) 12/02/2011  . Nonischemic cardiomyopathy ([place])   . Obesity (BMI 30.0-34.9) 06/11/2020  . Pre-diabetes   . Prediabetes   . Presence of permanent cardiac pacemaker   . Tubular adenoma 1999   history of    Patient Active Problem List   Diagnosis Date Noted  . Obesity (BMI 30.0-34.9) 06/11/2020  . Prediabetes 03/01/2020  . Biventricular implantable cardioverter-defibrillator in situ   . Nonischemic cardiomyopathy ([place]) 03/28/2012  . Chronic systolic heart failure ([place]) 01/05/2012  . HTN (hypertension) 12/02/2011  . Dyslipidemia 12/02/2011  . LBBB (left bundle branch block) 12/02/2011    Past Surgical History:  Procedure Laterality Date  . BI-VENTRICULAR IMPLANTABLE CARDIOVERTER DEFIBRILLATOR N/A 05/10/2012   Procedure: BI-VENTRICULAR IMPLANTABLE CARDIOVERTER DEFIBRILLATOR  (CRT-D);  Surgeon: Deboraha Sprang, MD;  Location: [facility] CATH LAB;  Service: Cardiovascular;  Laterality: N/A;  . BIV ICD GENERATOR CHANGEOUT N/A 12/22/2018   Procedure: BIV ICD GENERATOR CHANGEOUT;  Surgeon: Deboraha Sprang, MD;  Location: [facility] CV LAB;  Service: Cardiovascular;  Laterality: N/A;  . CARDIAC CATHETERIZATION  12-03-11   [facility]ie coronary angiography  . CARDIAC DEFIBRILLATOR PLACEMENT  05/10/2012   CRT-D implantation (05/10/2012)  . COLONOSCOPY WITH PROPOFOL N/A 03/17/2017   Procedure: COLONOSCOPY WITH PROPOFOL;  Surgeon: Collene Mares,  Mar Daring, MD;  Location: [facility] ENDOSCOPY;  Service: Endoscopy;  Laterality: N/A;  . LEFT HEART CATHETERIZATION WITH CORONARY ANGIOGRAM N/A 12/03/2011   Procedure: LEFT HEART CATHETERIZATION WITH CORONARY ANGIOGRAM;  Surgeon: Larey Dresser, MD;  Location: [facility] CATH LAB;  Service: Cardiovascular;  Laterality: N/A;  . PARTIAL HYSTERECTOMY  1980's     OB History   No obstetric history on file.     Family History  Problem Relation Age of Onset  . Angina Mother        diagnosed at age of 10's, unsure whether it  was a true diagnosis. unsure about the treatment  . Dementia Mother   . Sleep apnea Brother   . Kidney disease Father   . Diabetes Father   . Diabetes Brother   . Heart failure Maternal Grandmother        diagnosed in age of 59's. still living at age of 42's.  [name] Heart attack Maternal Grandmother   . Stroke Maternal Grandmother   . Breast cancer Maternal Grandmother   . Colon cancer Maternal Grandmother   . Colon cancer Other        family history  . Intellectual disability Other   . Hypertension Neg Hx     Social History   Tobacco Use  . Smoking status: Never Smoker  . Smokeless tobacco: Never Used  Vaping Use  . Vaping Use: Never used  Substance Use Topics  . Alcohol use: No    Alcohol/week: 0.0 standard drinks  . Drug use: No    Home Medications Prior to Admission medications   Medication Sig Start Date End Date Taking? Authorizing Provider  doxycycline (VIBRAMYCIN) 100 MG capsule Take 1 capsule (100 mg total) by mouth 2 (two) times daily. One po bid x 7 days 09/08/20  Yes Deno Etienne, DO  aspirin 81 MG tablet Take 81 mg by mouth daily.    [provider]  atorvastatin (LIPITOR) 20 MG tablet TAKE 1 TABLET BY MOUTH EVERY DAY 06/02/20   Deboraha Sprang, MD  Bioflavonoid Products (VITAMIN C-BIOFLAVONOIDS) 1000-100 MG TBCR Take 1 tablet by mouth daily.    [provider]  Camphor-Eucalyptus-Menthol (VICKS VAPORUB EX) Apply 1 application topically at bedtime.    [provider]  carvedilol (COREG) 12.5 MG tablet TAKE 1 TABLET BY MOUTH 2 (TWO) TIMES DAILY WITH A MEAL. CALL FOR OFFICE VISIT [phone] 06/27/20   Skeet Latch, MD  Cholecalciferol (VITAMIN D3) 250 MCG (10000 UT) TABS Take 1 tablet by mouth every other day.     [provider]  CINNAMON PO Take 1 capsule by mouth daily.    [provider]  ferrous sulfate 325 (65 FE) MG tablet TAKE 1 TABLET BY MOUTH EVERY DAY WITH BREAKFAST 09/04/20   [name], MD  furosemide  (LASIX) 20 MG tablet Take 1 tablet (20 mg total) by mouth every other day. 01/11/20   Deboraha Sprang, MD  lisinopril (ZESTRIL) 10 MG tablet Take 1 tablet (10 mg total) by mouth at bedtime. 05/20/20 08/18/20  [name], MD  Magnesium 250 MG TABS Take 200 mg by mouth daily.     [provider]  MELATONIN GUMMIES PO Take by mouth at bedtime.    [provider]  OVER THE COUNTER MEDICATION Apply 1 application topically at bedtime. Sleep balm, apply to face every night    [provider]  spironolactone (ALDACTONE) 25 MG tablet TAKE 1/2 TABLET BY MOUTH AT BEDTIME 05/20/20   Caryl Comes,  Revonda Standard, MD  SUPER B COMPLEX/C PO Take 1 tablet by mouth daily.    [provider]    Allergies    Decongestant [pseudoephedrine hcl er]  Review of Systems   Review of Systems  Constitutional: Negative for chills and fever.  HENT: Negative for congestion and rhinorrhea.   Eyes: Negative for redness and visual disturbance.  Respiratory: Negative for shortness of breath and wheezing.   Cardiovascular: Negative for chest pain and palpitations.  Gastrointestinal: Negative for nausea and vomiting.  Genitourinary: Negative for dysuria and urgency.  Musculoskeletal: Negative for arthralgias and myalgias.  Skin: Negative for pallor and wound.  Neurological: Negative for dizziness and headaches.    Physical Exam Updated Vital Signs BP 94/64   Pulse 83   Temp 99.3 F (37.4 C) (Oral)   Resp 18   Ht 5' 4.5" (1.638 m)   Wt 86.6 kg   SpO2 98%   BMI 32.28 kg/m   Physical Exam Vitals and nursing note reviewed.  Constitutional:      General: She is not in acute distress.    Appearance: She is well-developed. She is not diaphoretic.  HENT:     Head: Normocephalic and atraumatic.  Eyes:     Pupils: Pupils are equal, round, and reactive to light.   Cardiovascular:     Rate and Rhythm: Normal rate and regular rhythm.     Heart sounds: No murmur heard. No friction rub. No  gallop.   Pulmonary:     Effort: Pulmonary effort is normal.     Breath sounds: No wheezing or rales.  Abdominal:     General: There is no distension.     Palpations: Abdomen is soft.     Tenderness: There is no abdominal tenderness.  Musculoskeletal:        General: No tenderness.     Cervical back: Normal range of motion and neck supple.       Legs:  Skin:    General: Skin is warm and dry.  Neurological:     Mental Status: She is alert and oriented to person, place, and time.  Psychiatric:        Behavior: Behavior normal.     ED Results / Procedures / Treatments   Labs (all labs ordered are listed, but only abnormal results are displayed) Labs Reviewed - No data to display  EKG None  Radiology DG Ankle Complete Left  Result Date: 09/08/2020 CLINICAL DATA:  Swelling and discoloration for 2 weeks, foot and ankle pain EXAM: LEFT FOOT - COMPLETE 3+ VIEW; LEFT ANKLE COMPLETE - 3+ VIEW COMPARISON:  None. FINDINGS: Left ankle: Frontal, oblique, and lateral views of the left ankle are obtained. No fracture, subluxation, or dislocation. Joint spaces are well preserved. Large inferior calcaneal spur. Soft tissues are grossly unremarkable. Left foot: Frontal, oblique, lateral views of the left foot are obtained. There is a minimally displaced oblique fracture through the fourth proximal phalanx, with evidence of mild callus formation suggesting subacute injury. No other acute bony abnormalities. Joint spaces are well preserved. Mild dorsal soft tissue swelling of the forefoot. IMPRESSION: 1. Minimally displaced fourth proximal phalangeal fracture, with minimal callus formation suggesting subacute injury. 2. Mild dorsal soft tissue swelling of the forefoot. 3. Unremarkable left ankle. Electronically Signed   By: Randa Ngo M.D.   On: 09/08/2020 15:47   DG Foot Complete Left  Result Date: 09/08/2020 CLINICAL DATA:  Swelling and discoloration for 2 weeks, foot and ankle pain EXAM: LEFT  FOOT - COMPLETE 3+ VIEW; LEFT ANKLE COMPLETE - 3+ VIEW COMPARISON:  None. FINDINGS: Left ankle: Frontal, oblique, and lateral views of the left ankle are obtained. No fracture, subluxation, or dislocation. Joint spaces are well preserved. Large inferior calcaneal spur. Soft tissues are grossly unremarkable. Left foot: Frontal, oblique, lateral views of the left foot are obtained. There is a minimally displaced oblique fracture through the fourth proximal phalanx, with evidence of mild callus formation suggesting subacute injury. No other acute bony abnormalities. Joint spaces are well preserved. Mild dorsal soft tissue swelling of the forefoot. IMPRESSION: 1. Minimally displaced fourth proximal phalangeal fracture, with minimal callus formation suggesting subacute injury. 2. Mild dorsal soft tissue swelling of the forefoot. 3. Unremarkable left ankle. Electronically Signed   By: Randa Ngo M.D.   On: 09/08/2020 15:47    Procedures Procedures   Medications Ordered in ED Medications - No data to display  ED Course  I have reviewed the triage vital signs and the nursing notes.  Pertinent labs & imaging results that were available during my care of the patient were reviewed by me and considered in my medical decision making (see chart for details).    MDM Rules/Calculators/A&P                          70 yo F with a chief complaints of a stye and a foot injury.  Going on for about a week or so.  Will obtain a plain film of the left foot and ankle.  Start on antibiotics and warm compresses for the stye.  Ophthalmology follow-up.  X-ray viewed by me with the proximal left fourth metacarpal fracture without displacement.  Read as subacute by radiology.  Plain film of the ankle without fracture is viewed by me.  Discharge home.  4:13 PM:  I have discussed the diagnosis/risks/treatment options with the patient and believe the pt to be eligible for discharge home to follow-up with PCP. We also  discussed returning to the ED immediately if new or worsening sx occur. We discussed the sx which are most concerning (e.g., sudden worsening pain, fever, inability to tolerate by mouth) that necessitate immediate return. Medications administered to the patient during their visit and any new prescriptions provided to the patient are listed below.  Medications given during this visit Medications - No data to display   The patient appears reasonably screen and/or stabilized for discharge and I doubt any other medical condition or other [facility] requiring further screening, evaluation, or treatment in the ED at this time prior to discharge.    Final Clinical Impression(s) / ED Diagnoses Final diagnoses:  Closed nondisplaced fracture of shaft of fourth metacarpal bone of left hand, initial encounter  Hordeolum externum of right upper eyelid    Rx / DC Orders ED Discharge Orders         Ordered    doxycycline (VIBRAMYCIN) 100 MG capsule  2 times daily,   Status:  Discontinued        09/08/20 1505    doxycycline (VIBRAMYCIN) 100 MG capsule  2 times daily        09/08/20 [place], [place], DO 09/09/20 1613

## 2020-09-08 NOTE — ED Triage Notes (Signed)
Patient reports having a "stye" on her right for about 3 months with greater enlargement the past few days. She has noticed changes in vision in the same eye. The patient also reports falling several week ago. Post fall she has had pain in the left foot and change in skin color.

## 2020-09-08 NOTE — Discharge Instructions (Signed)
Warm compresses at least 4 times a day.  Follow-up with your ophthalmologist.  Take up to 4 over the counter ibuprofen tablets 3 times a day or 2 over-the-counter naproxen tablets twice a day for pain. Also take tylenol 1000mg (2 extra strength) four times a day.

## 2020-10-27 LAB — CUP PACEART REMOTE DEVICE CHECK
Battery Remaining Longevity: 44 mo
Battery Voltage: 2.96 V
Brady Statistic RV Percent Paced: 6.05 %
Date Time Interrogation Session: 20220526013737
HighPow Impedance: 69 Ohm
Implantable Lead Implant Date: 20131211
Implantable Lead Implant Date: 20131211
Implantable Lead Implant Date: 20131211
Implantable Lead Location: 753858
Implantable Lead Location: 753859
Implantable Lead Location: 753860
Implantable Lead Model: 181
Implantable Lead Model: 4396
Implantable Lead Model: 5076
Implantable Lead Serial Number: 32342
Implantable Pulse Generator Implant Date: 20200724
Lead Channel Impedance Value: 323 Ohm
Lead Channel Impedance Value: 475 Ohm
Lead Channel Impedance Value: 513 Ohm
Lead Channel Impedance Value: 570 Ohm
Lead Channel Impedance Value: 570 Ohm
Lead Channel Impedance Value: 931 Ohm
Lead Channel Pacing Threshold Amplitude: 0.625 V
Lead Channel Pacing Threshold Amplitude: 0.625 V
Lead Channel Pacing Threshold Amplitude: 2.375 V
Lead Channel Pacing Threshold Pulse Width: 0.4 ms
Lead Channel Pacing Threshold Pulse Width: 0.4 ms
Lead Channel Pacing Threshold Pulse Width: 1.5 ms
Lead Channel Sensing Intrinsic Amplitude: 11.3 mV
Lead Channel Sensing Intrinsic Amplitude: 3.1 mV
Lead Channel Setting Pacing Amplitude: 1.5 V
Lead Channel Setting Pacing Amplitude: 2.5 V
Lead Channel Setting Pacing Amplitude: 3 V
Lead Channel Setting Pacing Pulse Width: 0.4 ms
Lead Channel Setting Pacing Pulse Width: 1.5 ms
Lead Channel Setting Sensing Sensitivity: 0.45 mV

## 2020-10-28 ENCOUNTER — Ambulatory Visit (INDEPENDENT_AMBULATORY_CARE_PROVIDER_SITE_OTHER): Payer: Medicare Other

## 2020-10-28 DIAGNOSIS — I428 Other cardiomyopathies: Secondary | ICD-10-CM

## 2020-10-28 DIAGNOSIS — I5022 Chronic systolic (congestive) heart failure: Secondary | ICD-10-CM

## 2020-11-10 ENCOUNTER — Encounter: Payer: Self-pay | Admitting: *Deleted

## 2020-11-12 ENCOUNTER — Other Ambulatory Visit: Payer: Self-pay | Admitting: Internal Medicine

## 2020-11-19 NOTE — Progress Notes (Signed)
Remote ICD transmission.   

## 2020-12-03 ENCOUNTER — Other Ambulatory Visit: Payer: Self-pay | Admitting: Internal Medicine

## 2020-12-12 DIAGNOSIS — H3562 Retinal hemorrhage, left eye: Secondary | ICD-10-CM | POA: Diagnosis not present

## 2020-12-12 DIAGNOSIS — H0011 Chalazion right upper eyelid: Secondary | ICD-10-CM | POA: Diagnosis not present

## 2020-12-12 DIAGNOSIS — H3582 Retinal ischemia: Secondary | ICD-10-CM | POA: Diagnosis not present

## 2020-12-12 DIAGNOSIS — H348321 Tributary (branch) retinal vein occlusion, left eye, with retinal neovascularization: Secondary | ICD-10-CM | POA: Diagnosis not present

## 2020-12-23 ENCOUNTER — Other Ambulatory Visit: Payer: Self-pay | Admitting: Internal Medicine

## 2021-01-02 ENCOUNTER — Other Ambulatory Visit: Payer: Self-pay | Admitting: Internal Medicine

## 2021-01-05 ENCOUNTER — Encounter: Payer: Medicare Other | Admitting: Internal Medicine

## 2021-01-09 ENCOUNTER — Encounter: Payer: Medicare Other | Admitting: Internal Medicine

## 2021-01-09 DIAGNOSIS — I428 Other cardiomyopathies: Secondary | ICD-10-CM

## 2021-01-09 DIAGNOSIS — Z95810 Presence of automatic (implantable) cardiac defibrillator: Secondary | ICD-10-CM

## 2021-01-09 DIAGNOSIS — I447 Left bundle-branch block, unspecified: Secondary | ICD-10-CM

## 2021-01-09 DIAGNOSIS — I5022 Chronic systolic (congestive) heart failure: Secondary | ICD-10-CM

## 2021-01-27 ENCOUNTER — Telehealth: Payer: Self-pay

## 2021-01-27 ENCOUNTER — Ambulatory Visit (INDEPENDENT_AMBULATORY_CARE_PROVIDER_SITE_OTHER): Payer: Medicare Other

## 2021-01-27 DIAGNOSIS — I428 Other cardiomyopathies: Secondary | ICD-10-CM | POA: Diagnosis not present

## 2021-01-27 NOTE — Telephone Encounter (Signed)
Scheduled remote reviewed. Normal device function.  Optivol fluid index above threshold with thoracic impedance trending below daily reference.  Patient called and advised. States she not noticed any swelling in her feet/lower legs, abdominal swelling or shortness of breath. Reports compliance with medications on file including spironolactone 12.5 mg at bedtime, lasix 20 mg every other day, coreg 12.5 mg BID. Advised I will forward to Dr. Caryl Comes and we will call with any changes. Appreciative of call.

## 2021-01-28 LAB — CUP PACEART REMOTE DEVICE CHECK
Battery Remaining Longevity: 37 mo
Battery Voltage: 2.95 V
Brady Statistic RV Percent Paced: 6.31 %
Date Time Interrogation Session: 20220827064151
HighPow Impedance: 78 Ohm
Implantable Lead Implant Date: 20131211
Implantable Lead Implant Date: 20131211
Implantable Lead Implant Date: 20131211
Implantable Lead Location: 753858
Implantable Lead Location: 753859
Implantable Lead Location: 753860
Implantable Lead Model: 181
Implantable Lead Model: 4396
Implantable Lead Model: 5076
Implantable Lead Serial Number: 32342
Implantable Pulse Generator Implant Date: 20200724
Lead Channel Impedance Value: 323 Ohm
Lead Channel Impedance Value: 456 Ohm
Lead Channel Impedance Value: 456 Ohm
Lead Channel Impedance Value: 589 Ohm
Lead Channel Impedance Value: 608 Ohm
Lead Channel Impedance Value: 855 Ohm
Lead Channel Pacing Threshold Amplitude: 0.625 V
Lead Channel Pacing Threshold Amplitude: 0.625 V
Lead Channel Pacing Threshold Amplitude: 2.375 V
Lead Channel Pacing Threshold Pulse Width: 0.4 ms
Lead Channel Pacing Threshold Pulse Width: 0.4 ms
Lead Channel Pacing Threshold Pulse Width: 1.5 ms
Lead Channel Sensing Intrinsic Amplitude: 1.8 mV
Lead Channel Sensing Intrinsic Amplitude: 8.6 mV
Lead Channel Setting Pacing Amplitude: 1.5 V
Lead Channel Setting Pacing Amplitude: 2.5 V
Lead Channel Setting Pacing Amplitude: 3 V
Lead Channel Setting Pacing Pulse Width: 0.4 ms
Lead Channel Setting Pacing Pulse Width: 1.5 ms
Lead Channel Setting Sensing Sensitivity: 0.45 mV

## 2021-02-09 NOTE — Progress Notes (Signed)
Remote ICD transmission.   

## 2021-02-10 NOTE — Progress Notes (Incomplete)
Cardiology Office Note   Date:  02/10/2021   ID:  [name], Cynthia Roth [birth year], MRN [id number]  PCP:  [name], MD  Cardiologist:   Madelin Rear  Electrophysiologist: Dr. Caryl Comes  No chief complaint on file.     History of Present Illness: Cynthia Roth is a 70 y.o. female with chronic systolic and diastolic heart failure resolved after CRT-D, CAD, hypertension and LBBB here for follow-up. She initially established care 06/11/2020.  She was initially a patient of Dr. [name]. She was admitted to [facility] 11/2011 with acute systolic and diastolic heart failure.  Her left ventricle was severely dilated and  LVEF was 15%.  She also had a LBBB.  LHC at that time showed mild, non-obstructive CAD.  Repeat echo 3 months later showed persistently depressed LV functoin.  She had a CRT-D implanted 04/2012.  Follow up echo 3/204 showed LVEF 20% with mild-moderate MR and normal RV function.  Repeat echo 06/2015 showed an improvement to 55-60% and it was 50-55% 82018.  She last saw Dr. [name] 12/2016 and has been discharged from HF clinic given her clinical improvement.    Cynthia Roth has been feeling well.  She does Zumba 3 days per week.  She has no chest pain or shortness of breath with exertion.  She reports feeling drowsy all the time.  She sleeps approximately 5 hours per night.  She wakes up  2-3 times per night to urinate.  She is unsure whether she snores.  She doesn't feel rested in the mornings and she falls asleep easily during the day.  She sometimes has palpitations when lying down but not throughout the day.  She does get lightheaded 4-5 times per week.  This typically occurs with positional changes.  She denies syncope.  Lisinopril was reduced from '40mg'$  to '20mg'$  due to hypotension.  She has very mild edema but no orthopnea or PND.   Lisinopril was reduced from '40mg'$  to '20mg'$  due to hypotension.  This did improve her hypotension and dizziness.  She followed up with  EP and general cardiology on 10/2019 and was doing well.  She is frustrated that she can't lose weight.  She is unsure what to eat.  She doesn't eat much and only eats one meal to day.  She has been busy helping to care for family member and not exercising.  She feels tired and has struggled with sleep.  She has been feeling depressed at times.  She fell down 4-5 steps outside her house.  She was carrying garbage bags and tripped.  She hd bad scrapes but no broken bones.  She has an occasional twinge of chest pain when laying down in bed.  It occurs when she is feeling stressed and anxious.  She is working with Dr. Volanda Napoleon about this.  At her last appointment, she was struggling with her weight, so she was referred to the PREP program. Today,  She denies any palpitations, chest pain, or shortness of breath. No lightheadedness, headaches, syncope, orthopnea, or PND. Also has no lower extremity edema or exertional symptoms.   Past Medical History:  Diagnosis Date   AICD (automatic cardioverter/defibrillator) present    Allergy    Biventricular ICD -Medtronic    DOI 12/13 - Medtronic Viva XR CRT-D defibrillator, serial #BLF [id number].     CHF (congestive heart failure) (HCC)    Chronic systolic heart failure ([place]) 12/02/2011   Colon polyps    Dr Mechele Collin  Dyslipidemia 12/02/2011   Gout    "very seldom" (05/10/2012)   Hx of colonic polyps    Hyperlipidemia    Hypertension    "used to have this; now my BP is low" (05/10/2012)   LBBB (left bundle branch block) 12/02/2011   Nonischemic cardiomyopathy ([place])    Obesity (BMI 30.0-34.9) 06/11/2020   Pre-diabetes    Prediabetes    Presence of permanent cardiac pacemaker    Tubular adenoma 1999   history of    Past Surgical History:  Procedure Laterality Date   BI-VENTRICULAR IMPLANTABLE CARDIOVERTER DEFIBRILLATOR N/A 05/10/2012   Procedure: BI-VENTRICULAR IMPLANTABLE CARDIOVERTER DEFIBRILLATOR  (CRT-D);  Surgeon: Deboraha Sprang, MD;  Location:  [facility] CATH LAB;  Service: Cardiovascular;  Laterality: N/A;   BIV ICD GENERATOR CHANGEOUT N/A 12/22/2018   Procedure: BIV ICD GENERATOR CHANGEOUT;  Surgeon: Deboraha Sprang, MD;  Location: [place] CV LAB;  Service: Cardiovascular;  Laterality: N/A;   CARDIAC CATHETERIZATION  12-03-11   selectie coronary angiography   CARDIAC DEFIBRILLATOR PLACEMENT  05/10/2012   CRT-D implantation (05/10/2012)   COLONOSCOPY WITH PROPOFOL N/A 03/17/2017   Procedure: COLONOSCOPY WITH PROPOFOL;  Surgeon: Juanita Craver, MD;  Location: [facility] ENDOSCOPY;  Service: Endoscopy;  Laterality: N/A;   LEFT HEART CATHETERIZATION WITH CORONARY ANGIOGRAM N/A 12/03/2011   Procedure: LEFT HEART CATHETERIZATION WITH CORONARY ANGIOGRAM;  Surgeon: Larey Dresser, MD;  Location: [facility] CATH LAB;  Service: Cardiovascular;  Laterality: N/A;   PARTIAL HYSTERECTOMY  1980's     Current Outpatient Medications  Medication Sig Dispense Refill   aspirin 81 MG tablet Take 81 mg by mouth daily.     atorvastatin (LIPITOR) 20 MG tablet TAKE 1 TABLET BY MOUTH EVERY DAY 90 tablet 1   BIDIL 20-37.5 MG tablet TAKE (1/2) TABLET THREE TIMES DAILY. 135 tablet 2   Bioflavonoid Products (VITAMIN C-BIOFLAVONOIDS) 1000-100 MG TBCR Take 1 tablet by mouth daily.     Camphor-Eucalyptus-Menthol (VICKS VAPORUB EX) Apply 1 application topically at bedtime.     carvedilol (COREG) 12.5 MG tablet TAKE 1 TABLET BY MOUTH 2 (TWO) TIMES DAILY WITH A MEAL. CALL FOR OFFICE VISIT [phone] 180 tablet 3   Cholecalciferol (VITAMIN D3) 250 MCG (10000 UT) TABS Take 1 tablet by mouth every other day.      CINNAMON PO Take 1 capsule by mouth daily.     doxycycline (VIBRAMYCIN) 100 MG capsule Take 1 capsule (100 mg total) by mouth 2 (two) times daily. One po bid x 7 days 14 capsule 0   ferrous sulfate 325 (65 FE) MG tablet TAKE 1 TABLET BY MOUTH EVERY DAY WITH BREAKFAST 90 tablet 3   furosemide (LASIX) 20 MG tablet TAKE 1 TABLET BY MOUTH EVERY OTHER DAY 45 tablet 2   lisinopril  (ZESTRIL) 10 MG tablet Take 1 tablet (10 mg total) by mouth at bedtime. 90 tablet 3   Magnesium 250 MG TABS Take 200 mg by mouth daily.      MELATONIN GUMMIES PO Take by mouth at bedtime.     OVER THE COUNTER MEDICATION Apply 1 application topically at bedtime. Sleep balm, apply to face every night     spironolactone (ALDACTONE) 25 MG tablet TAKE 1/2 TABLET BY MOUTH EVERY DAY AT BEDTIME 45 tablet 2   SUPER B COMPLEX/C PO Take 1 tablet by mouth daily.     No current facility-administered medications for this visit.    Allergies:   Decongestant [pseudoephedrine hcl er]    Social History:  The patient  reports that she has never smoked. She has never used smokeless tobacco. She reports that she does not drink alcohol and does not use drugs.   Family History:  The patient's family history includes Angina in her mother; Breast cancer in her maternal grandmother; Colon cancer in her maternal grandmother and another family member; Dementia in her mother; Diabetes in her brother and father; Heart attack in her maternal grandmother; Heart failure in her maternal grandmother; Intellectual disability in an other family member; Kidney disease in her father; Sleep apnea in her brother; Stroke in her maternal grandmother.    ROS:   Please see the history of present illness. (+) All other systems are reviewed and negative.    PHYSICAL EXAM: VS:  There were no vitals taken for this visit. , BMI There is no height or weight on file to calculate BMI. GENERAL:  Well appearing HEENT:  Pupils equal round and reactive, fundi not visualized, oral mucosa unremarkable NECK:  No jugular venous distention, waveform within normal limits, carotid upstroke brisk and symmetric, no bruits, no thyromegaly LYMPHATICS:  No cervical adenopathy LUNGS:  Clear to auscultation bilaterally HEART:  RRR.  PMI not displaced or sustained,S1 and S2 within normal limits, no S3, no S4, no clicks, no rubs, no murmurs ABD:  Flat,  positive bowel sounds normal in frequency in pitch, no bruits, no rebound, no guarding, no midline pulsatile mass, no hepatomegaly, no splenomegaly EXT:  2 plus pulses throughout, no edema, no cyanosis no clubbing SKIN:  No rashes no nodules NEURO:  Cranial nerves II through XII grossly intact, motor grossly intact throughout PSYCH:  Cognitively intact, oriented to person place and time    EKG:   02/11/2021: Sinus ***. Rate *** bpm. 06/11/2020: EKG was not ordered.  Echo 01/18/17: Study Conclusions - Left ventricle: The cavity size was normal. Wall thickness was   normal. Systolic function was normal. The estimated ejection   fraction was in the range of 50% to 55%. Wall motion was normal;   there were no regional wall motion abnormalities. Features are   consistent with a pseudonormal left ventricular filling pattern,   with concomitant abnormal relaxation and increased filling   pressure (grade 2 diastolic dysfunction). - Mitral valve: There was mild regurgitation.  Echo 11/2011:  LVEF 15%.  Diffuse hypokinesis.  Moderate mitral regurgitation.  IVC dilated with normal respirophasic variation.  Recent Labs: 02/27/2020: BUN 19; Creat 1.46; Hemoglobin 10.1; Platelets 284; Potassium 4.3; Sodium 140    Lipid Panel    Component Value Date/Time   CHOL 142 02/27/2020 0845   TRIG 91 02/27/2020 0845   HDL 42 (L) 02/27/2020 0845   CHOLHDL 3.4 02/27/2020 0845   VLDL 12.0 02/22/2019 1350   LDLCALC 81 02/27/2020 0845      Wt Readings from Last 3 Encounters:  09/08/20 191 lb (86.6 kg)  06/11/20 194 lb 12.8 oz (88.4 kg)  02/27/20 197 lb 12.8 oz (89.7 kg)      ASSESSMENT AND PLAN: No problem-specific Assessment & Plan notes found for this encounter.  # Chronic systolic and diastolic heart failure: LVEF improved to 50 to 55% 12/2016.  She has a CRT-D device that is followed by Dr. Caryl Comes and functioning well.  She has no heart failure symptoms.  She is euvolemic on exam.  Continue  carvedilol, BiDil, lisinopril, spironolactone, and furosemide.  Renal function stable 01/2020.  # Hypertension: BP well controlled.  Continue BiDil, carvedilol, lisinopril, and spironolactone.  # Obesity: Ms. Gunst has been  struggling with weight loss.  She is not really clear on what she should be eating.  We will refer her to a nutritionist.  We will also have her enroll in the PREP exercise program through the [facility] for education and exercise training.  We did discuss that weight training would help increase her muscle mass, improve her stability, and raise her basal metabolic rate.  She expressed understanding.  # CAD: No symptoms of ischemia.  Continue aspirin, atorvastatin, carvedilol.  LDL was slightly above goal 01/2020.  Working on diet and exercise as above is likely to get her under 70, which is her goal.   Current medicines are reviewed at length with the patient today.  The patient does not have concerns regarding medicines.  The following changes have been made: None  Labs/ tests ordered today include:   No orders of the defined types were placed in this encounter.    Disposition:   FU with Tiffany C. Oval Linsey, MD, [facility] in *** months.    I,Mathew Stumpf,acting as a Education administrator for Skeet Latch, MD.,have documented all relevant documentation on the behalf of Skeet Latch, MD,as directed by  Skeet Latch, MD while in the presence of Skeet Latch, MD.  ***  Signed, Tiffany C. Oval Linsey, MD, [facility]  02/10/2021 1:46 PM    [facility]

## 2021-02-11 ENCOUNTER — Encounter (HOSPITAL_BASED_OUTPATIENT_CLINIC_OR_DEPARTMENT_OTHER): Payer: Self-pay | Admitting: Cardiovascular Disease

## 2021-02-11 ENCOUNTER — Ambulatory Visit (HOSPITAL_BASED_OUTPATIENT_CLINIC_OR_DEPARTMENT_OTHER): Payer: Medicare Other | Admitting: Cardiovascular Disease

## 2021-02-11 ENCOUNTER — Other Ambulatory Visit: Payer: Self-pay

## 2021-02-11 VITALS — BP 110/72 | HR 59 | Ht 64.5 in | Wt 184.3 lb

## 2021-02-11 DIAGNOSIS — I428 Other cardiomyopathies: Secondary | ICD-10-CM

## 2021-02-11 DIAGNOSIS — Z5181 Encounter for therapeutic drug level monitoring: Secondary | ICD-10-CM

## 2021-02-11 DIAGNOSIS — Z95810 Presence of automatic (implantable) cardiac defibrillator: Secondary | ICD-10-CM

## 2021-02-11 DIAGNOSIS — I5042 Chronic combined systolic (congestive) and diastolic (congestive) heart failure: Secondary | ICD-10-CM

## 2021-02-11 DIAGNOSIS — I10 Essential (primary) hypertension: Secondary | ICD-10-CM

## 2021-02-11 DIAGNOSIS — I2510 Atherosclerotic heart disease of native coronary artery without angina pectoris: Secondary | ICD-10-CM

## 2021-02-11 NOTE — Progress Notes (Signed)
Cardiology Office Note  Date:  02/22/2021   ID:  Cynthia, Roth 1951-03-10, MRN [id number]  PCP:  [name], MD  Cardiologist:   Skeet Latch, MD  Electrophysiologist: Dr. Caryl Comes  No chief complaint on file.    History of Present Illness: Cynthia Roth is a 70 y.o. female with chronic systolic and diastolic heart failure resolved after CRT-D, CAD, hypertension and LBBB here for follow-up. She initially established care 06/11/2020 .  She was initially a patient of Dr. [name]. She was admitted to [facility] 11/2011 with acute systolic and diastolic heart failure.  Her left ventricle was severely dilated and  LVEF was 15%.  She also had a LBBB.  LHC at that time showed mild, non-obstructive CAD.  Repeat echo 3 months later showed persistently depressed LV functoin.  She had a CRT-D implanted 04/2012.  Follow up echo 3/204 showed LVEF 20% with mild-moderate MR and normal RV function.  Repeat echo 06/2015 showed an improvement to 55-60% and it was 50-55% 82018.  She saw Dr. [name] 12/2016 and had been discharged from HF clinic given her clinical improvement.    Lisinopril was reduced from '40mg'$  to '20mg'$  due to hypotension. This did improve her hypotension and dizziness. Today, she is feeling well overall. However, for most of 2022 she has been suffering from bilateral knee and ankle pain. She has only tried OTC medication and does not currently follow with orthopedics. Since her last visit her breathing is stable. At times she feels dizzy, including when she gets up from a chair and she will "stumble" for a little bit. Occasionally she misses her mid-day dose of Bidil, but is normally compliant with her other doses. For exercise, she has started walking for an hour at the mall, but inconsistently. Lately, she has been trying to increase her walking speed.  For her diet, she has stopped cooking her meals and is ordering out more often. She does not eat large portions. From  one order she is able to cover up to 3 meals. Commonly she drinks soda but tries to buy smaller cans. She endorses bilateral ankle edema, and is using orthotic inserts in her shoes. After a lot of walking her ankles will become swollen. Today, she has not had her morning medication because she did not eat yet. She denies any palpitations, chest pain,  No headaches, syncope, orthopnea, or PND. Also has no lower extremity edema or exertional symptoms.   Past Medical History:  Diagnosis Date   AICD (automatic cardioverter/defibrillator) present    Allergy    Biventricular ICD -Medtronic    DOI 12/13 - Medtronic Viva XR CRT-D defibrillator, serial #BLF [id number].     CAD in native artery 02/22/2021   CHF (congestive heart failure) ([place])    Chronic systolic heart failure ([place]) 12/02/2011   Colon polyps    Dr Mar Daring [facility]   Dyslipidemia 12/02/2011   Gout    "very seldom" (05/10/2012)   Hx of colonic polyps    Hyperlipidemia    Hypertension    "used to have this; now my BP is low" (05/10/2012)   LBBB (left bundle branch block) 12/02/2011   Nonischemic cardiomyopathy ([place])    Obesity (BMI 30.0-34.9) 06/11/2020   Pre-diabetes    Prediabetes    Presence of permanent cardiac pacemaker    Tubular adenoma 1999   history of    Past Surgical History:  Procedure Laterality Date   BI-VENTRICULAR IMPLANTABLE CARDIOVERTER DEFIBRILLATOR N/A 05/10/2012  Procedure: BI-VENTRICULAR IMPLANTABLE CARDIOVERTER DEFIBRILLATOR  (CRT-D);  Surgeon: Deboraha Sprang, MD;  Location: [facility] CATH LAB;  Service: Cardiovascular;  Laterality: N/A;   BIV ICD GENERATOR CHANGEOUT N/A 12/22/2018   Procedure: BIV ICD GENERATOR CHANGEOUT;  Surgeon: Deboraha Sprang, MD;  Location: [place] CV LAB;  Service: Cardiovascular;  Laterality: N/A;   CARDIAC CATHETERIZATION  12-03-11   selectie coronary angiography   CARDIAC DEFIBRILLATOR PLACEMENT  05/10/2012   CRT-D implantation (05/10/2012)   COLONOSCOPY WITH PROPOFOL N/A 03/17/2017    Procedure: COLONOSCOPY WITH PROPOFOL;  Surgeon: Juanita Craver, MD;  Location: [facility] ENDOSCOPY;  Service: Endoscopy;  Laterality: N/A;   LEFT HEART CATHETERIZATION WITH CORONARY ANGIOGRAM N/A 12/03/2011   Procedure: LEFT HEART CATHETERIZATION WITH CORONARY ANGIOGRAM;  Surgeon: Larey Dresser, MD;  Location: [facility] CATH LAB;  Service: Cardiovascular;  Laterality: N/A;   PARTIAL HYSTERECTOMY  1980's     Current Outpatient Medications  Medication Sig Dispense Refill   aspirin 81 MG tablet Take 81 mg by mouth daily.     atorvastatin (LIPITOR) 20 MG tablet TAKE 1 TABLET BY MOUTH EVERY DAY 90 tablet 1   Bioflavonoid Products (VITAMIN C-BIOFLAVONOIDS) 1000-100 MG TBCR Take 1 tablet by mouth daily.     Camphor-Eucalyptus-Menthol (VICKS VAPORUB EX) Apply 1 application topically at bedtime.     carvedilol (COREG) 12.5 MG tablet TAKE 1 TABLET BY MOUTH 2 (TWO) TIMES DAILY WITH A MEAL. CALL FOR OFFICE VISIT [phone] 180 tablet 3   Cholecalciferol (VITAMIN D3) 250 MCG (10000 UT) TABS Take 1 tablet by mouth every other day.      CINNAMON PO Take 1 capsule by mouth daily.     doxycycline (VIBRAMYCIN) 100 MG capsule Take 1 capsule (100 mg total) by mouth 2 (two) times daily. One po bid x 7 days 14 capsule 0   ferrous sulfate 325 (65 FE) MG tablet TAKE 1 TABLET BY MOUTH EVERY DAY WITH BREAKFAST 90 tablet 3   furosemide (LASIX) 20 MG tablet TAKE 1 TABLET BY MOUTH EVERY OTHER DAY 45 tablet 2   isosorbide-hydrALAZINE (BIDIL) 20-37.5 MG tablet Take by mouth as directed. TAKE 1/2 TABLET TWICE A DAY     Magnesium 250 MG TABS Take 200 mg by mouth daily.      MELATONIN GUMMIES PO Take by mouth at bedtime.     OVER THE COUNTER MEDICATION Apply 1 application topically at bedtime. Sleep balm, apply to face every night     spironolactone (ALDACTONE) 25 MG tablet TAKE 1/2 TABLET BY MOUTH EVERY DAY AT BEDTIME 45 tablet 2   SUPER B COMPLEX/C PO Take 1 tablet by mouth daily.     lisinopril (ZESTRIL) 10 MG tablet Take 1 tablet (10  mg total) by mouth at bedtime. 90 tablet 3   No current facility-administered medications for this visit.    Allergies:   Decongestant [pseudoephedrine hcl er]    Social History:  The patient  reports that she has never smoked. She has never used smokeless tobacco. She reports that she does not drink alcohol and does not use drugs.   Family History:  The patient's family history includes Angina in her mother; Breast cancer in her maternal grandmother; Colon cancer in her maternal grandmother and another family member; Dementia in her mother; Diabetes in her brother and father; Heart attack in her maternal grandmother; Heart failure in her maternal grandmother; Intellectual disability in an other family member; Kidney disease in her father; Sleep apnea in her brother; Stroke in her maternal  grandmother.    ROS:   Please see the history of present illness. (+) Bilateral knee and ankle pain (+) Dizziness (+) Bilateral ankle edema All other systems are reviewed and negative.    PHYSICAL EXAM: VS:  BP 110/72   Pulse (!) 59   Ht 5' 4.5" (1.638 m)   Wt 184 lb 4.8 oz (83.6 kg)   BMI 31.15 kg/m  , BMI Body mass index is 31.15 kg/m. GENERAL:  Well appearing HEENT:  Pupils equal round and reactive, fundi not visualized, oral mucosa unremarkable NECK:  No jugular venous distention, waveform within normal limits, carotid upstroke brisk and symmetric, no bruits, no thyromegaly LYMPHATICS:  No cervical adenopathy LUNGS:  Clear to auscultation bilaterally HEART:  RRR.  PMI not displaced or sustained,S1 and S2 within normal limits, no S3, no S4, no clicks, no rubs, no murmurs ABD:  Flat, positive bowel sounds normal in frequency in pitch, no bruits, no rebound, no guarding, no midline pulsatile mass, no hepatomegaly, no splenomegaly EXT:  2 plus pulses throughout, no edema, no cyanosis no clubbing SKIN:  No rashes no nodules NEURO:  Cranial nerves II through XII grossly intact, motor grossly  intact throughout [facility]:  Cognitively intact, oriented to person place and time   EKG:   02/11/2021: Atrial-sensed, Ventricular-paced. Rate 59 bpm. 06/11/2020: EKG was not ordered.  Echo 01/18/17:   Study Conclusions   - Left ventricle: The cavity size was normal. Wall thickness was   normal. Systolic function was normal. The estimated ejection   fraction was in the range of 50% to 55%. Wall motion was normal;   there were no regional wall motion abnormalities. Features are   consistent with a pseudonormal left ventricular filling pattern,   with concomitant abnormal relaxation and increased filling   pressure (grade 2 diastolic dysfunction). - Mitral valve: There was mild regurgitation.  Echo 11/2011:  LVEF 15%.  Diffuse hypokinesis.  Moderate mitral regurgitation.  IVC dilated with normal respirophasic variation.  Recent Labs: 02/27/2020: BUN 19; Creat 1.46; Hemoglobin 10.1; Platelets 284; Potassium 4.3; Sodium 140    Lipid Panel    Component Value Date/Time   CHOL 142 02/27/2020 0845   TRIG 91 02/27/2020 0845   HDL 42 (L) 02/27/2020 0845   CHOLHDL 3.4 02/27/2020 0845   VLDL 12.0 02/22/2019 1350   LDLCALC 81 02/27/2020 0845      Wt Readings from Last 3 Encounters:  02/11/21 184 lb 4.8 oz (83.6 kg)  09/08/20 191 lb (86.6 kg)  06/11/20 194 lb 12.8 oz (88.4 kg)      ASSESSMENT AND PLAN: HTN (hypertension) BP is well-controlled.  She still struggles with dizziness.  We will reduce BiDil ot twice per day.  Continue carvdilol and spironolactone.    Nonischemic cardiomyopathy (HCC) LVEF improved to 50-55%.  She is euvolemic and doing well.  She continues to have low BP.  We will reduce her Bidil from tid to bid.  Continue carvedilol, lisinopril, spironolactone and lasix.   Biventricular implantable cardioverter-defibrillator in situ Follows with Dr. Caryl Comes.  Device stable.   CAD in native artery Non-obstructive disease on cath during her work up for heart failure.  She  has no ischemic symptoms.  Continue aspirin, carvedilol, and atorvastatin.    Current medicines are reviewed at length with the patient today.  The patient does not have concerns regarding medicines.  The following changes have been made: None  Labs/ tests ordered today include:   Orders Placed This Encounter  Procedures  Lipid panel   Comprehensive metabolic panel   Amb Referral To Provider Referral Exercise Program (P.R.E.P)   EKG 12-Lead   ECHOCARDIOGRAM COMPLETE      Disposition:   FU with Selvin Yun C. Oval Linsey, MD, [facility] in 6 months.    I,Mathew Stumpf,acting as a Education administrator for Skeet Latch, MD.,have documented all relevant documentation on the behalf of Skeet Latch, MD,as directed by  Skeet Latch, MD while in the presence of Skeet Latch, MD.  I, [place] Oval Linsey, MD have reviewed all documentation for this visit.  The documentation of the exam, diagnosis, procedures, and orders on 02/22/2021 are all accurate and complete.   Signed, Ayce Pietrzyk C. Oval Linsey, MD, [facility]  02/22/2021 6:37 PM    [place]

## 2021-02-11 NOTE — Assessment & Plan Note (Signed)
BP is well-controlled.  She still struggles with dizziness.  We will reduce BiDil ot twice per day.  Continue carvdilol and spironolactone.

## 2021-02-11 NOTE — Patient Instructions (Addendum)
Medication Instructions:  DECREASE YOUR BIDIL TO TWICE A DAY   *If you need a refill on your cardiac medications before your next appointment, please call your pharmacy*  Lab Work: FASTING LP/CMET   If you have labs (blood work) drawn today and your tests are completely normal, you will receive your results only by: [place] (if you have MyChart) OR A paper copy in the mail If you have any lab test that is abnormal or we need to change your treatment, we will call you to review the results.  Testing/Procedures: Your physician has requested that you have an echocardiogram. Echocardiography is a painless test that uses sound waves to create images of your heart. It provides your doctor with information about the size and shape of your heart and how well your heart's chambers and valves are working. This procedure takes approximately one hour. There are no restrictions for this procedure.   Follow-Up: At [facility], you and your health needs are our priority.  As part of our continuing mission to provide you with exceptional heart care, we have created designated Provider Care Teams.  These Care Teams include your primary Cardiologist (physician) and Advanced Practice Providers (APPs -  Physician Assistants and Nurse Practitioners) who all work together to provide you with the care you need, when you need it.  We recommend signing up for the patient portal called "MyChart".  Sign up information is provided on this After Visit Summary.  MyChart is used to connect with patients for Virtual Visits (Telemedicine).  Patients are able to view lab/test results, encounter notes, upcoming appointments, etc.  Non-urgent messages can be sent to your provider as well.   To learn more about what you can do with MyChart, go to [email or web address].    Your next appointment:   12 month(s)  The format for your next appointment:   In Person  Provider:   Skeet Latch, MD  Other  Instructions  Consider Ozempic for weight loss.  PAM OR LORA  FROM PREP [employer] PRO[facility] WILL BE CALLING YOU

## 2021-02-12 ENCOUNTER — Telehealth: Payer: Self-pay

## 2021-02-12 NOTE — Telephone Encounter (Signed)
Called to discuss PREP program, left voicemail asking for return call. 

## 2021-02-13 ENCOUNTER — Telehealth: Payer: Self-pay | Admitting: Family Medicine

## 2021-02-13 NOTE — Telephone Encounter (Signed)
Left message for patient to call back and schedule Medicare Annual Wellness Visit (AWV) either virtually or in office. Left  my Herbie Drape number [phone]   Last AWV 03/03/20 please schedule at anytime with [facility] Nurse Health Advisor 1 or 2   This should be a 45 minute visit.

## 2021-02-22 ENCOUNTER — Encounter (HOSPITAL_BASED_OUTPATIENT_CLINIC_OR_DEPARTMENT_OTHER): Payer: Self-pay | Admitting: Cardiovascular Disease

## 2021-02-22 DIAGNOSIS — I2510 Atherosclerotic heart disease of native coronary artery without angina pectoris: Secondary | ICD-10-CM

## 2021-02-22 HISTORY — DX: Atherosclerotic heart disease of native coronary artery without angina pectoris: I25.10

## 2021-02-22 NOTE — Assessment & Plan Note (Signed)
Follows with Dr. Caryl Comes.  Device stable.

## 2021-02-22 NOTE — Assessment & Plan Note (Signed)
Non-obstructive disease on cath during her work up for heart failure.  She has no ischemic symptoms.  Continue aspirin, carvedilol, and atorvastatin.

## 2021-02-22 NOTE — Assessment & Plan Note (Signed)
LVEF improved to 50-55%.  She is euvolemic and doing well.  She continues to have low BP.  We will reduce her Bidil from tid to bid.  Continue carvedilol, lisinopril, spironolactone and lasix.

## 2021-02-26 ENCOUNTER — Other Ambulatory Visit (HOSPITAL_BASED_OUTPATIENT_CLINIC_OR_DEPARTMENT_OTHER): Payer: Medicare Other

## 2021-03-02 ENCOUNTER — Encounter (HOSPITAL_BASED_OUTPATIENT_CLINIC_OR_DEPARTMENT_OTHER): Payer: Self-pay

## 2021-03-04 ENCOUNTER — Telehealth (HOSPITAL_BASED_OUTPATIENT_CLINIC_OR_DEPARTMENT_OTHER): Payer: Self-pay | Admitting: Cardiovascular Disease

## 2021-03-04 NOTE — Telephone Encounter (Signed)
Lm for patient to call and discuss rescheduling the Echo ordered by Dr. Oval Linsey

## 2021-03-26 ENCOUNTER — Other Ambulatory Visit: Payer: Self-pay

## 2021-03-26 ENCOUNTER — Ambulatory Visit (INDEPENDENT_AMBULATORY_CARE_PROVIDER_SITE_OTHER): Payer: Medicare Other

## 2021-03-26 DIAGNOSIS — I5042 Chronic combined systolic (congestive) and diastolic (congestive) heart failure: Secondary | ICD-10-CM

## 2021-03-26 DIAGNOSIS — I10 Essential (primary) hypertension: Secondary | ICD-10-CM | POA: Diagnosis not present

## 2021-03-27 LAB — ECHOCARDIOGRAM COMPLETE
AR max vel: 1.98 cm2
AV Area VTI: 1.94 cm2
AV Area mean vel: 1.86 cm2
AV Mean grad: 3 mmHg
AV Peak grad: 5.6 mmHg
Ao pk vel: 1.18 m/s
Area-P 1/2: 3.87 cm2
Calc EF: 56.2 %
S' Lateral: 2.36 cm
Single Plane A2C EF: 56.3 %
Single Plane A4C EF: 54.5 %

## 2021-03-31 ENCOUNTER — Ambulatory Visit: Payer: Medicare Other | Admitting: Internal Medicine

## 2021-03-31 ENCOUNTER — Encounter: Payer: Self-pay | Admitting: Internal Medicine

## 2021-03-31 ENCOUNTER — Other Ambulatory Visit: Payer: Self-pay

## 2021-03-31 VITALS — BP 100/60 | HR 71 | Ht 64.5 in | Wt 182.0 lb

## 2021-03-31 DIAGNOSIS — I447 Left bundle-branch block, unspecified: Secondary | ICD-10-CM | POA: Diagnosis not present

## 2021-03-31 DIAGNOSIS — I5022 Chronic systolic (congestive) heart failure: Secondary | ICD-10-CM | POA: Diagnosis not present

## 2021-03-31 DIAGNOSIS — Z95810 Presence of automatic (implantable) cardiac defibrillator: Secondary | ICD-10-CM

## 2021-03-31 DIAGNOSIS — I428 Other cardiomyopathies: Secondary | ICD-10-CM | POA: Diagnosis not present

## 2021-03-31 LAB — CUP PACEART INCLINIC DEVICE CHECK
Battery Remaining Longevity: 37 mo
Battery Voltage: 2.95 V
Brady Statistic AP VP Percent: 0.04 %
Brady Statistic AP VS Percent: 0 %
Brady Statistic AS VP Percent: 95.9 %
Brady Statistic AS VS Percent: 4.05 %
Brady Statistic RA Percent Paced: 0.06 %
Brady Statistic RV Percent Paced: 5.45 %
Date Time Interrogation Session: 20221101164351
HighPow Impedance: 72 Ohm
Implantable Lead Implant Date: 20131211
Implantable Lead Implant Date: 20131211
Implantable Lead Implant Date: 20131211
Implantable Lead Location: 753858
Implantable Lead Location: 753859
Implantable Lead Location: 753860
Implantable Lead Model: 181
Implantable Lead Model: 4396
Implantable Lead Model: 5076
Implantable Lead Serial Number: 32342
Implantable Pulse Generator Implant Date: 20200724
Lead Channel Impedance Value: 1026 Ohm
Lead Channel Impedance Value: 323 Ohm
Lead Channel Impedance Value: 475 Ohm
Lead Channel Impedance Value: 532 Ohm
Lead Channel Impedance Value: 551 Ohm
Lead Channel Impedance Value: 608 Ohm
Lead Channel Pacing Threshold Amplitude: 0.5 V
Lead Channel Pacing Threshold Amplitude: 0.625 V
Lead Channel Pacing Threshold Amplitude: 0.75 V
Lead Channel Pacing Threshold Amplitude: 0.75 V
Lead Channel Pacing Threshold Amplitude: 2.375 V
Lead Channel Pacing Threshold Amplitude: 2.75 V
Lead Channel Pacing Threshold Amplitude: 4 V
Lead Channel Pacing Threshold Pulse Width: 0.4 ms
Lead Channel Pacing Threshold Pulse Width: 0.4 ms
Lead Channel Pacing Threshold Pulse Width: 0.4 ms
Lead Channel Pacing Threshold Pulse Width: 0.4 ms
Lead Channel Pacing Threshold Pulse Width: 1.5 ms
Lead Channel Pacing Threshold Pulse Width: 1.5 ms
Lead Channel Pacing Threshold Pulse Width: 1.5 ms
Lead Channel Sensing Intrinsic Amplitude: 10.6 mV
Lead Channel Sensing Intrinsic Amplitude: 2 mV
Lead Channel Setting Pacing Amplitude: 1.5 V
Lead Channel Setting Pacing Amplitude: 2 V
Lead Channel Setting Pacing Amplitude: 3 V
Lead Channel Setting Pacing Pulse Width: 0.4 ms
Lead Channel Setting Pacing Pulse Width: 1.5 ms
Lead Channel Setting Sensing Sensitivity: 0.45 mV

## 2021-03-31 NOTE — Progress Notes (Signed)
Patient Care Team: [name], MD as PCP - General (Family Medicine) Skeet Latch, MD as PCP - Cardiology (Cardiology) Deboraha Sprang, MD as PCP - Electrophysiology (Cardiology) Larey Dresser, MD as Consulting Physician (Cardiology) Juanita Craver, MD as Consulting Physician (Gastroenterology)   HPI  Cynthia Roth is a 70 y.o. female Seen in followup for congestive heart failure with CRT-D implantation for nonischemic cardiomyopathy 12/13; s/p gen change 7/20   interval normalization of LV function   The patient denies chest pain, nocturnal dyspnea, orthopnea .  There have been no palpitations, lightheadedness or syncope.  Complains of shortness of breath but notices it only on the stairs some peripheral edema.   DATE TEST EF   10/13 Echo   15 %   1/17 Echo   55-60 %   8/18 Echo  50-55%   10/22 Echo  50-55% Asymmetric LVH (See Below)       Date Cr K Hgb  2/16   13.8  8/18 1.31 4.7 9.9  11/20  1.78 5.1 10.3  9/21 1.46  10.1     Past Medical History:  Diagnosis Date   AICD (automatic cardioverter/defibrillator) present    Allergy    Biventricular ICD -Medtronic    DOI 12/13 - Medtronic Viva XR CRT-D defibrillator, serial #[id number].     CAD in native artery 02/22/2021   CHF (congestive heart failure) ([place])    Chronic systolic heart failure ([place]) 12/02/2011   Colon polyps    Dr Mar D[facility]   Dyslipidemia 12/02/2011   Gout    "very seldom" (05/10/2012)   Hx of colonic polyps    Hyperlipidemia    Hypertension    "used to have this; now my BP is low" (05/10/2012)   LBBB (left bundle branch block) 12/02/2011   Nonischemic cardiomyopathy ([place])    Obesity (BMI 30.0-34.9) 06/11/2020   Pre-diabetes    Prediabetes    Presence of permanent cardiac pacemaker    Tubular adenoma 1999   history of    Past Surgical History:  Procedure Laterality Date   BI-VENTRICULAR IMPLANTABLE CARDIOVERTER DEFIBRILLATOR N/A 05/10/2012   Procedure:  BI-VENTRICULAR IMPLANTABLE CARDIOVERTER DEFIBRILLATOR  (CRT-D);  Surgeon: Deboraha Sprang, MD;  Location: [facility] CATH LAB;  Service: Cardiovascular;  Laterality: N/A;   BIV ICD GENERATOR CHANGEOUT N/A 12/22/2018   Procedure: BIV ICD GENERATOR CHANGEOUT;  Surgeon: Deboraha Sprang, MD;  Location: [facility] CV LAB;  Service: Cardiovascular;  Laterality: N/A;   CARDIAC CATHETERIZATION  12-03-11   selectie coronary angiography   CARDIAC DEFIBRILLATOR PLACEMENT  05/10/2012   CRT-D implantation (05/10/2012)   COLONOSCOPY WITH PROPOFOL N/A 03/17/2017   Procedure: COLONOSCOPY WITH PROPOFOL;  Surgeon: Juanita Craver, MD;  Location: [facility] ENDOSCOPY;  Service: Endoscopy;  Laterality: N/A;   LEFT HEART CATHETERIZATION WITH CORONARY ANGIOGRAM N/A 12/03/2011   Procedure: LEFT HEART CATHETERIZATION WITH CORONARY ANGIOGRAM;  Surgeon: Larey Dresser, MD;  Location: [facility] CATH LAB;  Service: Cardiovascular;  Laterality: N/A;   PARTIAL HYSTERECTOMY  1980's    Current Outpatient Medications  Medication Sig Dispense Refill   aspirin 81 MG tablet Take 81 mg by mouth daily.     atorvastatin (LIPITOR) 20 MG tablet TAKE 1 TABLET BY MOUTH EVERY DAY 90 tablet 1   Bioflavonoid Products (VITAMIN C-BIOFLAVONOIDS) 1000-100 MG TBCR Take 1 tablet by mouth daily.     Camphor-Eucalyptus-Menthol (VICKS VAPORUB EX) Apply 1 application topically at bedtime.     carvedilol (COREG) 12.5  MG tablet TAKE 1 TABLET BY MOUTH 2 (TWO) TIMES DAILY WITH A MEAL. CALL FOR OFFICE VISIT [phone] 180 tablet 3   Cholecalciferol (VITAMIN D3) 250 MCG (10000 UT) TABS Take 1 tablet by mouth every other day.      CINNAMON PO Take 1 capsule by mouth daily.     doxycycline (VIBRAMYCIN) 100 MG capsule Take 1 capsule (100 mg total) by mouth 2 (two) times daily. One po bid x 7 days 14 capsule 0   ferrous sulfate 325 (65 FE) MG tablet TAKE 1 TABLET BY MOUTH EVERY DAY WITH BREAKFAST 90 tablet 3   furosemide (LASIX) 20 MG tablet TAKE 1 TABLET BY MOUTH EVERY OTHER DAY 45  tablet 2   isosorbide-hydrALAZINE (BIDIL) 20-37.5 MG tablet Take by mouth as directed. TAKE 1/2 TABLET TWICE A DAY     lisinopril (ZESTRIL) 10 MG tablet Take 1 tablet (10 mg total) by mouth at bedtime. 90 tablet 3   Magnesium 250 MG TABS Take 200 mg by mouth daily.      MELATONIN GUMMIES PO Take by mouth at bedtime.     OVER THE COUNTER MEDICATION Apply 1 application topically at bedtime. Sleep balm, apply to face every night     spironolactone (ALDACTONE) 25 MG tablet TAKE 1/2 TABLET BY MOUTH EVERY DAY AT BEDTIME 45 tablet 2   SUPER B COMPLEX/C PO Take 1 tablet by mouth daily.     No current facility-administered medications for this visit.    Allergies  Allergen Reactions   Decongestant [Pseudoephedrine Hcl Er]     Unknown reaction    Review of Systems negative except from HPI and PMH  Physical Exam BP 100/60 (BP Location: Left Arm, Patient Position: Sitting, Cuff Size: Normal)   Pulse 71   Ht 5' 4.5" (1.638 m)   Wt 182 lb (82.6 kg)   SpO2 98%   BMI 30.76 kg/m  Well developed and well nourished in no acute distress HENT normal Neck supple with JVP-flat Clear Device pocket well healed; without hematoma or erythema.  There is no tethering  Regular rate and rhythm, no  murmur Abd-soft with active BS No Clubbing cyanosis  edema; large ankles  Skin-warm and dry A & Oriented  Grossly normal sensory and motor function  ECG P-synchronous/ AV  Pacing @ 71 with QRS neg Lead 1 and  QRS neg Lead V1   CrCl cannot be calculated (Patient's most recent lab result is older than the maximum 21 days allowed.). I was at Assessment and  Plan  Nonischemic cardiomyopathy interval improvement  Anemia  HFpEF  Implantable defibrillator-Medtronic-CRT-high LV pacing threshold  Renal insufficiency grade 3  Orthostatic lightheadedness  Lightheadedness is much improved with a down titrated medications.  We will continue carvedilol 12.5, BiDil one half twice daily lisinopril 10 and  Aldactone 12.5.  She has been feet but no significant pitting edema.  We will continue her on furosemide 20 mg every other day  Still with some dyspnea.  I will reach out to Dr. Oval Linsey to consider the use of an SGLT2  I have also suggested she reach out to her primary care physician to consider evaluation of her anemia.  It looks as if her hemoglobin was in the 13 range and relatively stably about 10 years ago.  I do not know whether the delta is sufficient to be of concern and the fact that it is longstanding mitigates the associated concerns.

## 2021-03-31 NOTE — Patient Instructions (Signed)

## 2021-04-06 ENCOUNTER — Ambulatory Visit (HOSPITAL_BASED_OUTPATIENT_CLINIC_OR_DEPARTMENT_OTHER): Payer: Medicare Other | Admitting: Cardiovascular Disease

## 2021-04-16 ENCOUNTER — Ambulatory Visit (INDEPENDENT_AMBULATORY_CARE_PROVIDER_SITE_OTHER): Payer: Medicare Other

## 2021-04-16 VITALS — BP 120/72 | HR 64 | Temp 98.3°F | Ht 64.0 in | Wt 185.0 lb

## 2021-04-16 DIAGNOSIS — Z0000 Encounter for general adult medical examination without abnormal findings: Secondary | ICD-10-CM | POA: Diagnosis not present

## 2021-04-16 NOTE — Patient Instructions (Addendum)
Cynthia Roth , Thank you for taking time to come for your Medicare Wellness Visit. I appreciate your ongoing commitment to your health goals. Please review the following plan we discussed and let me know if I can assist you in the future.   These are the goals we discussed:  Goals      Increase physical activity     Patient want to lose weight, increase walking exercise and Change diet.     Weight (lb) < 150 lb (68 kg)        This is a list of the screening recommended for you and due dates:  Health Maintenance  Topic Date Due   COVID-19 Vaccine (4 - Booster for Pfizer series) 05/02/2021*   Zoster (Shingles) Vaccine (1 of 2) 07/17/2021*   Flu Shot  08/28/2021*   Pneumonia Vaccine (2 - PPSV23 if available, else PCV20) 04/16/2022*   Tetanus Vaccine  04/16/2022*   Mammogram  06/24/2022   Colon Cancer Screening  03/18/2027   DEXA scan (bone density measurement)  Completed   Hepatitis C Screening: USPSTF Recommendation to screen - Ages 41-79 yo.  Completed   HPV Vaccine  Aged Out  *Topic was postponed. The date shown is not the original due date.   Advanced directives: Yes  Conditions/risks identified: None  Next appointment: Follow up in one year for your annual wellness visit    Preventive Care 65 Years and Older, Female Preventive care refers to lifestyle choices and visits with your health care provider that can promote health and wellness. What does preventive care include? A yearly physical exam. This is also called an annual well check. Dental exams once or twice a year. Routine eye exams. Ask your health care provider how often you should have your eyes checked. Personal lifestyle choices, including: Daily care of your teeth and gums. Regular physical activity. Eating a healthy diet. Avoiding tobacco and drug use. Limiting alcohol use. Practicing safe sex. Taking low-dose aspirin every day. Taking vitamin and mineral supplements as recommended by your health care  provider. What happens during an annual well check? The services and screenings done by your health care provider during your annual well check will depend on your age, overall health, lifestyle risk factors, and family history of disease. Counseling  Your health care provider may ask you questions about your: Alcohol use. Tobacco use. Drug use. Emotional well-being. Home and relationship well-being. Sexual activity. Eating habits. History of falls. Memory and ability to understand (cognition). Work and work Statistician. Reproductive health. Screening  You may have the following tests or measurements: Height, weight, and BMI. Blood pressure. Lipid and cholesterol levels. These may be checked every 5 years, or more frequently if you are over 94 years old. Skin check. Lung cancer screening. You may have this screening every year starting at age 69 if you have a 30-pack-year history of smoking and currently smoke or have quit within the past 15 years. Fecal occult blood test (FOBT) of the stool. You may have this test every year starting at age 69. Flexible sigmoidoscopy or colonoscopy. You may have a sigmoidoscopy every 5 years or a colonoscopy every 10 years starting at age 69. Hepatitis C blood test. Hepatitis B blood test. Sexually transmitted disease (STD) testing. Diabetes screening. This is done by checking your blood sugar (glucose) after you have not eaten for a while (fasting). You may have this done every 1-3 years. Bone density scan. This is done to screen for osteoporosis. You may have this  done starting at age 69. Mammogram. This may be done every 1-2 years. Talk to your health care provider about how often you should have regular mammograms. Talk with your health care provider about your test results, treatment options, and if necessary, the need for more tests. Vaccines  Your health care provider may recommend certain vaccines, such as: Influenza vaccine. This is  recommended every year. Tetanus, diphtheria, and acellular pertussis (Tdap, Td) vaccine. You may need a Td booster every 10 years. Zoster vaccine. You may need this after age 69. Pneumococcal 13-valent conjugate (PCV13) vaccine. One dose is recommended after age 69. Pneumococcal polysaccharide (PPSV23) vaccine. One dose is recommended after age 69. Talk to your health care provider about which screenings and vaccines you need and how often you need them. This information is not intended to replace advice given to you by your health care provider. Make sure you discuss any questions you have with your health care provider. Document Released: 06/13/2015 Document Revised: 02/04/2016 Document Reviewed: 03/18/2015 Elsevier Interactive Patient Education  2017 [place] Prevention in the Home Falls can cause injuries. They can happen to people of all ages. There are many things you can do to make your home safe and to help prevent falls. What can I do on the outside of my home? Regularly fix the edges of walkways and driveways and fix any cracks. Remove anything that might make you trip as you walk through a door, such as a raised step or threshold. Trim any bushes or trees on the path to your home. Use bright outdoor lighting. Clear any walking paths of anything that might make someone trip, such as rocks or tools. Regularly check to see if handrails are loose or broken. Make sure that both sides of any steps have handrails. Any raised decks and porches should have guardrails on the edges. Have any leaves, snow, or ice cleared regularly. Use sand or salt on walking paths during winter. Clean up any spills in your garage right away. This includes oil or grease spills. What can I do in the bathroom? Use night lights. Install grab bars by the toilet and in the tub and shower. Do not use towel bars as grab bars. Use non-skid mats or decals in the tub or shower. If you need to sit down in  the shower, use a plastic, non-slip stool. Keep the floor dry. Clean up any water that spills on the floor as soon as it happens. Remove soap buildup in the tub or shower regularly. Attach bath mats securely with double-sided non-slip rug tape. Do not have throw rugs and other things on the floor that can make you trip. What can I do in the bedroom? Use night lights. Make sure that you have a light by your bed that is easy to reach. Do not use any sheets or blankets that are too big for your bed. They should not hang down onto the floor. Have a firm chair that has side arms. You can use this for support while you get dressed. Do not have throw rugs and other things on the floor that can make you trip. What can I do in the kitchen? Clean up any spills right away. Avoid walking on wet floors. Keep items that you use a lot in easy-to-reach places. If you need to reach something above you, use a strong step stool that has a grab bar. Keep electrical cords out of the way. Do not use floor polish or wax  that makes floors slippery. If you must use wax, use non-skid floor wax. Do not have throw rugs and other things on the floor that can make you trip. What can I do with my stairs? Do not leave any items on the stairs. Make sure that there are handrails on both sides of the stairs and use them. Fix handrails that are broken or loose. Make sure that handrails are as long as the stairways. Check any carpeting to make sure that it is firmly attached to the stairs. Fix any carpet that is loose or worn. Avoid having throw rugs at the top or bottom of the stairs. If you do have throw rugs, attach them to the floor with carpet tape. Make sure that you have a light switch at the top of the stairs and the bottom of the stairs. If you do not have them, ask someone to add them for you. What else can I do to help prevent falls? Wear shoes that: Do not have high heels. Have rubber bottoms. Are comfortable  and fit you well. Are closed at the toe. Do not wear sandals. If you use a stepladder: Make sure that it is fully opened. Do not climb a closed stepladder. Make sure that both sides of the stepladder are locked into place. Ask someone to hold it for you, if possible. Clearly mark and make sure that you can see: Any grab bars or handrails. First and last steps. Where the edge of each step is. Use tools that help you move around (mobility aids) if they are needed. These include: Canes. Walkers. Scooters. Crutches. Turn on the lights when you go into a dark area. Replace any light bulbs as soon as they burn out. Set up your furniture so you have a clear path. Avoid moving your furniture around. If any of your floors are uneven, fix them. If there are any pets around you, be aware of where they are. Review your medicines with your doctor. Some medicines can make you feel dizzy. This can increase your chance of falling. Ask your doctor what other things that you can do to help prevent falls. This information is not intended to replace advice given to you by your health care provider. Make sure you discuss any questions you have with your health care provider. Document Released: 03/13/2009 Document Revised: 10/23/2015 Document Reviewed: 06/21/2014 Elsevier Interactive Patient Education  2017 [employer].

## 2021-04-16 NOTE — Progress Notes (Signed)
Subjective:   Cynthia Roth is a 70 y.o. female who presents for Medicare Annual (Subsequent) preventive examination.  Review of Systems    No ROS Cardiac Risk Factors include: advanced age (>39men, >38 women);hypertension     Objective:    Today's Vitals   04/16/21 0809  BP: 120/72  Pulse: 64  Temp: 98.3 F (36.8 C)  Weight: 185 lb (83.9 kg)  Height: 5\' 4"  (1.626 m)   Body mass index is 31.76 kg/m.  Advanced Directives 04/16/2021 03/03/2020 04/25/2019 12/22/2018 03/17/2017 03/07/2017 07/24/2014  Does Patient Have a Medical Advance Directive? No No No No No No No  Copy of Healthcare Power of Attorney in Chart? - - - - No - copy requested No - copy requested -  Would patient like information on creating a medical advance directive? No - Patient declined No - Patient declined No - Patient declined No - Patient declined - - No - patient declined information  Pre-existing out of facility DNR order (yellow form or pink MOST form) - - - - - - -    Current Medications (verified) Outpatient Encounter Medications as of 04/16/2021  Medication Sig   aspirin 81 MG tablet Take 81 mg by mouth daily.   atorvastatin (LIPITOR) 20 MG tablet TAKE 1 TABLET BY MOUTH EVERY DAY   Bioflavonoid Products (VITAMIN C-BIOFLAVONOIDS) 1000-100 MG TBCR Take 1 tablet by mouth daily.   Camphor-Eucalyptus-Menthol (VICKS VAPORUB EX) Apply 1 application topically at bedtime.   carvedilol (COREG) 12.5 MG tablet TAKE 1 TABLET BY MOUTH 2 (TWO) TIMES DAILY WITH A MEAL. CALL FOR OFFICE VISIT [phone]   Cholecalciferol (VITAMIN D3) 250 MCG (10000 UT) TABS Take 1 tablet by mouth every other day.    CINNAMON PO Take 1 capsule by mouth daily.   ferrous sulfate 325 (65 FE) MG tablet TAKE 1 TABLET BY MOUTH EVERY DAY WITH BREAKFAST   furosemide (LASIX) 20 MG tablet TAKE 1 TABLET BY MOUTH EVERY OTHER DAY   isosorbide-hydrALAZINE (BIDIL) 20-37.5 MG tablet Take by mouth as directed. TAKE 1/2 TABLET TWICE A DAY    Magnesium 250 MG TABS Take 200 mg by mouth daily.    MELATONIN GUMMIES PO Take by mouth at bedtime.   OVER THE COUNTER MEDICATION Apply 1 application topically at bedtime. Sleep balm, apply to face every night   spironolactone (ALDACTONE) 25 MG tablet TAKE 1/2 TABLET BY MOUTH EVERY DAY AT BEDTIME   SUPER B COMPLEX/C PO Take 1 tablet by mouth daily.   doxycycline (VIBRAMYCIN) 100 MG capsule Take 1 capsule (100 mg total) by mouth 2 (two) times daily. One po bid x 7 days (Patient not taking: Reported on 04/16/2021)   lisinopril (ZESTRIL) 10 MG tablet Take 1 tablet (10 mg total) by mouth at bedtime.   No facility-administered encounter medications on file as of 04/16/2021.    Allergies (verified) Decongestant [pseudoephedrine hcl er]   History: Past Medical History:  Diagnosis Date   AICD (automatic cardioverter/defibrillator) present    Allergy    Biventricular ICD -Medtronic    DOI 12/13 - Medtronic Viva XR CRT-D defibrillator, serial #[id number] H.     CAD in native artery 02/22/2021   CHF (congestive heart failure) ([place])    Chronic systolic heart failure ([place]) 12/02/2011   Colon polyps    Dr Mar Daring [facility]   Dyslipidemia 12/02/2011   Gout    "very seldom" (05/10/2012)   Hx of colonic polyps    Hyperlipidemia    Hypertension    "  used to have this; now my BP is low" (05/10/2012)   LBBB (left bundle branch block) 12/02/2011   Nonischemic cardiomyopathy ([place])    Obesity (BMI 30.0-34.9) 06/11/2020   Pre-diabetes    Prediabetes    Presence of permanent cardiac pacemaker    Tubular adenoma 1999   history of   Past Surgical History:  Procedure Laterality Date   BI-VENTRICULAR IMPLANTABLE CARDIOVERTER DEFIBRILLATOR N/A 05/10/2012   Procedure: BI-VENTRICULAR IMPLANTABLE CARDIOVERTER DEFIBRILLATOR  (CRT-D);  Surgeon: Deboraha Sprang, MD;  Location: [facility] CATH LAB;  Service: Cardiovascular;  Laterality: N/A;   BIV ICD GENERATOR CHANGEOUT N/A 12/22/2018   Procedure: BIV ICD GENERATOR  CHANGEOUT;  Surgeon: Deboraha Sprang, MD;  Location: [facility] CV LAB;  Service: Cardiovascular;  Laterality: N/A;   CARDIAC CATHETERIZATION  12-03-11   selectie coronary angiography   CARDIAC DEFIBRILLATOR PLACEMENT  05/10/2012   CRT-D implantation (05/10/2012)   COLONOSCOPY WITH PROPOFOL N/A 03/17/2017   Procedure: COLONOSCOPY WITH PROPOFOL;  Surgeon: Juanita Craver, MD;  Location: [facility] ENDOSCOPY;  Service: Endoscopy;  Laterality: N/A;   LEFT HEART CATHETERIZATION WITH CORONARY ANGIOGRAM N/A 12/03/2011   Procedure: LEFT HEART CATHETERIZATION WITH CORONARY ANGIOGRAM;  Surgeon: Larey Dresser, MD;  Location: [facility] CATH LAB;  Service: Cardiovascular;  Laterality: N/A;   PARTIAL HYSTERECTOMY  1980's   Family History  Problem Relation Age of Onset   Angina Mother        diagnosed at age of 40's, unsure whether it was a true diagnosis. unsure about the treatment   Dementia Mother    Sleep apnea Brother    Kidney disease Father    Diabetes Father    Diabetes Brother    Heart failure Maternal Grandmother        diagnosed in age of 46's. still living at age of 32's.   Heart attack Maternal Grandmother    Stroke Maternal Grandmother    Breast cancer Maternal Grandmother    Colon cancer Maternal Grandmother    Colon cancer Other        family history   Intellectual disability Other    Hypertension Neg Hx    Social History   Socioeconomic History   Marital status: Divorced    Spouse name: n/a   Number of children: 1   Years of education: Master's   Highest education level: Not on file  Occupational History   Occupation: Conservator, museum/gallery  Tobacco Use   Smoking status: Never   Smokeless tobacco: Never  Vaping Use   Vaping Use: Never used  Substance and Sexual Activity   Alcohol use: No    Alcohol/week: 0.0 standard drinks   Drug use: No   Sexual activity: Never  Other Topics Concern   Not on file  Social History Narrative   Lives in  [place], and her mother, grandmother and one brother live with her. Divorced with one adult son who lives in [place], [place]. One brother lives in [place], [place]. Another lives in [place]. 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   Social Determinants of Health   Financial Resource Strain: Low Risk    Difficulty of Paying Living Expenses: Not hard at all  Food Insecurity: No Food Insecurity   Worried About Charity fundraiser in the Last Year: Never true   [place] in the Last Year: Never true  Transportation Needs: No Transportation Needs   Lack of Transportation (Medical):  No   Lack of Transportation (Non-Medical): No  Physical Activity: Insufficiently Active   Days of Exercise per Week: 5 days   Minutes of Exercise per Session: 20 min  Stress: No Stress Concern Present   Feeling of Stress : Not at all  Social Connections: Moderately Integrated   Frequency of Communication with Friends and Family: More than three times a week   Frequency of Social Gatherings with Friends and Family: More than three times a week   Attends Religious Services: More than 4 times per year   Active Member of [employer] or Organizations: Yes   Attends Music therapist: More than 4 times per year   Marital Status: Divorced    Clinical Intake:  Diabetic? No  Interpreter Needed?: No+Activities of Daily Living In your present state of health, do you have any difficulty performing the following activities: 04/16/2021  Hearing? N  Vision? N  Comment Wears glasses. Followed by [facility]  Difficulty concentrating or making decisions? N  Walking or climbing stairs? N  Dressing or bathing? N  Doing errands, shopping? N  Preparing Food and eating ? N  Using the Toilet? N  In the past six months, have you accidently leaked urine? N  Do you have problems with loss of bowel control? N  Managing your Medications? N  Managing your Finances? N  Housekeeping or  managing your Housekeeping? N  Some recent data might be hidden    Patient Care Team: [name], MD as PCP - General (Family Medicine) Skeet Latch, MD as PCP - Cardiology (Cardiology) Deboraha Sprang, MD as PCP - Electrophysiology (Cardiology) Larey Dresser, MD as Consulting Physician (Cardiology) Juanita Craver, MD as Consulting Physician (Gastroenterology)  Indicate any recent Medical Services you may have received from other than [facility] providers in the past year (date may be approximate).     Assessment:   This is a routine wellness examination for Sh[place]ona.  Hearing/Vision screen Hearing Screening - Comments:: No difficulty hearing Vision Screening - Comments:: Wears glasses. Follow[facility]  Dietary issues and exercise activities discussed: Current Exercise Habits: Home exercise routine, Type of exercise: stretching;walking, Time (Minutes): 20, Frequency (Times/Week): 4, Weekly Exercise (Minutes/Week): 80, Intensity: Mild  Diet: Regular    Goals Addressed             This Visit's Progress    Increase physical activity       Patient want to lose weight, increase walking exercise and Change diet.       Depression Screen PHQ 2/9 Scores 04/16/2021 03/03/2020 02/27/2020 09/03/2019 01/24/2017 10/27/2016  PHQ - 2 Score 0 0 0 6 2 1   PHQ- 9 Score - 0 - 18 6 -    Fall Risk Fall Risk  04/16/2021 03/03/2020 02/27/2020 10/19/2016  Falls in the past year? 1 1 1  No  Number falls in past yr: 0 0 0 -  Comment Fall followed by PCP. No injuries - - -  Injury with Fall? 0 0 1 -  Comment - has bruising to arm and abdomen Skin bruises on the arm -  Risk for fall due to : - History of fall(s) - -  Follow up - Falls evaluation completed;Falls prevention discussed - -    FALL RISK PREVENTION PERTAINING TO THE HOME:  Any stairs in or around the home? Yes  If so, are there any without handrails? No  Home free of loose throw rugs in walkways, pet beds,  electrical  cords, etc? No. Patient states work being done in home at present.  Adequate lighting in your home to reduce risk of falls? Yes   ASSISTIVE DEVICES UTILIZED TO PREVENT FALLS:  Life alert? No  Use of a cane, walker or w/c? No  Grab bars in the bathroom? Yes  Shower chair or bench in shower? No  Elevated toilet seat or a handicapped toilet? No   TIMED UP AND GO:  Was the test performed?  Yes .  Length of time to ambulate 10 feet: 5 sec.   Gait steady and fast without use of assistive device  Cognitive Function:     6CIT Screen 04/16/2021 03/03/2020  What Year? 0 points 0 points  What month? 0 points 0 points  What time? 0 points 0 points  Count back from 20 0 points 0 points  Months in reverse 0 points 0 points  Repeat phrase 0 points 0 points  Total Score 0 0    Immunizations Immunization History  Administered Date(s) Administered   Fluad Quad(high Dose 65+) 02/22/2019   PFIZER(Purple Top)SARS-COV-2 Vaccination 07/02/2019, 07/23/2019   Pneumococcal Conjugate-13 02/27/2020    TDAP status: Due, Education has been provided regarding the importance of this vaccine. Advised may receive this vaccine at local pharmacy or Health Dept. Aware to provide a copy of the vaccination record if obtained from local pharmacy or Health Dept. Verbalized acceptance and understanding.  Flu Vaccine status: Due, Education has been provided regarding the importance of this vaccine. Advised may receive this vaccine at local pharmacy or Health Dept. Aware to provide a copy of the vaccination record if obtained from local pharmacy or Health Dept. Verbalized acceptance and understanding.  Pneumococcal vaccine status: Due, Education has been provided regarding the importance of this vaccine. Advised may receive this vaccine at local pharmacy or Health Dept. Aware to provide a copy of the vaccination record if obtained from local pharmacy or Health Dept. Verbalized acceptance and  understanding.  Covid-19 vaccine status: Information provided on how to obtain vaccines.   Qualifies for Shingles Vaccine? Yes   Zostavax completed No   Shingrix Completed?: No.    Education has been provided regarding the importance of this vaccine. Patient has been advised to call insurance company to determine out of pocket expense if they have not yet received this vaccine. Advised may also receive vaccine at local pharmacy or Health Dept. Verbalized acceptance and understanding.  Screening Tests Health Maintenance  Topic Date Due   COVID-19 Vaccine (4 - Booster for Pfizer series) 05/02/2021 (Originally 06/10/2020)   Zoster Vaccines- Shingrix (1 of 2) 07/17/2021 (Originally 10/22/2001)   INFLUENZA VACCINE  08/28/2021 (Originally 12/29/2020)   Pneumonia Vaccine 10+ Years old (2 - PPSV23 if available, else PCV20) 04/16/2022 (Originally 02/26/2021)   TETANUS/TDAP  04/16/2022 (Originally [birth year]/23/1970)   MAMMOGRAM  06/24/2022   COLONOSCOPY (Pts 45-85yrs Insurance coverage will need to be confirmed)  03/18/2027   DEXA SCAN  Completed   Hepatitis C Screening  Completed   HPV VACCINES  Aged Out    Health Maintenance  There are no preventive care reminders to display for this patient.  Vision Screening: Recommended annual ophthalmology exams for early detection of glaucoma and other disorders of the eye. Is the patient up to date with their annual eye exam?  Yes  Who is the provider or what is the name of the office in which the patient attends annual eye exams? Followed by [facility]  Dental Screening: Recommended annual dental exams  for proper oral hygiene  Community Resource Referral / Chronic Care Management:  CRR required this visit?  No   CCM required this visit?  No      Plan:     I have personally reviewed and noted the following in the patient's chart:   Medical and social history Use of alcohol, tobacco or illicit drugs  Current medications and supplements  including opioid prescriptions. Not currently taking opioids Functional ability and status Nutritional status Physical activity Advanced directives List of other physicians Hospitalizations, surgeries, and ER visits in previous 12 months Vitals Screenings to include cognitive, depression, and falls Referrals and appointments  In addition, I have reviewed and discussed with patient certain preventive protocols, quality metrics, and best practice recommendations. A written personalized care plan for preventive services as well as general preventive health recommendations were provided to patient.     Criselda Peaches, LPN   [id number]

## 2021-04-17 ENCOUNTER — Other Ambulatory Visit: Payer: Self-pay | Admitting: Family Medicine

## 2021-04-28 ENCOUNTER — Ambulatory Visit (INDEPENDENT_AMBULATORY_CARE_PROVIDER_SITE_OTHER): Payer: Medicare Other

## 2021-04-28 DIAGNOSIS — I428 Other cardiomyopathies: Secondary | ICD-10-CM | POA: Diagnosis not present

## 2021-04-28 LAB — CUP PACEART REMOTE DEVICE CHECK
Battery Remaining Longevity: 34 mo
Battery Voltage: 2.95 V
Brady Statistic AP VP Percent: 0.03 %
Brady Statistic AP VS Percent: 0 %
Brady Statistic AS VP Percent: 98.56 %
Brady Statistic AS VS Percent: 1.41 %
Brady Statistic RA Percent Paced: 0.03 %
Brady Statistic RV Percent Paced: 3.81 %
Date Time Interrogation Session: 20221129004444
HighPow Impedance: 69 Ohm
Implantable Lead Implant Date: 20131211
Implantable Lead Implant Date: 20131211
Implantable Lead Implant Date: 20131211
Implantable Lead Location: 753858
Implantable Lead Location: 753859
Implantable Lead Location: 753860
Implantable Lead Model: 181
Implantable Lead Model: 4396
Implantable Lead Model: 5076
Implantable Lead Serial Number: 32342
Implantable Pulse Generator Implant Date: 20200724
Lead Channel Impedance Value: 323 Ohm
Lead Channel Impedance Value: 513 Ohm
Lead Channel Impedance Value: 532 Ohm
Lead Channel Impedance Value: 532 Ohm
Lead Channel Impedance Value: 551 Ohm
Lead Channel Impedance Value: 988 Ohm
Lead Channel Pacing Threshold Amplitude: 0.625 V
Lead Channel Pacing Threshold Amplitude: 0.625 V
Lead Channel Pacing Threshold Amplitude: 2.375 V
Lead Channel Pacing Threshold Pulse Width: 0.4 ms
Lead Channel Pacing Threshold Pulse Width: 0.4 ms
Lead Channel Pacing Threshold Pulse Width: 1.5 ms
Lead Channel Sensing Intrinsic Amplitude: 10.6 mV
Lead Channel Sensing Intrinsic Amplitude: 3 mV
Lead Channel Setting Pacing Amplitude: 1.5 V
Lead Channel Setting Pacing Amplitude: 2.5 V
Lead Channel Setting Pacing Amplitude: 3 V
Lead Channel Setting Pacing Pulse Width: 0.4 ms
Lead Channel Setting Pacing Pulse Width: 1.5 ms
Lead Channel Setting Sensing Sensitivity: 0.45 mV

## 2021-05-06 NOTE — Progress Notes (Signed)
Remote ICD transmission.   

## 2021-06-14 ENCOUNTER — Other Ambulatory Visit: Payer: Self-pay | Admitting: Family Medicine

## 2021-06-14 DIAGNOSIS — D649 Anemia, unspecified: Secondary | ICD-10-CM

## 2021-06-18 ENCOUNTER — Other Ambulatory Visit: Payer: Self-pay | Admitting: Family Medicine

## 2021-06-18 DIAGNOSIS — Z1231 Encounter for screening mammogram for malignant neoplasm of breast: Secondary | ICD-10-CM

## 2021-07-09 ENCOUNTER — Ambulatory Visit: Payer: Medicare Other

## 2021-07-13 ENCOUNTER — Other Ambulatory Visit: Payer: Self-pay | Admitting: Cardiovascular Disease

## 2021-07-13 NOTE — Telephone Encounter (Signed)
Rx(s) sent to pharmacy electronically.  

## 2021-07-15 ENCOUNTER — Other Ambulatory Visit (HOSPITAL_COMMUNITY): Payer: Self-pay | Admitting: Cardiovascular Disease

## 2021-07-28 ENCOUNTER — Ambulatory Visit (INDEPENDENT_AMBULATORY_CARE_PROVIDER_SITE_OTHER): Payer: Medicare Other

## 2021-07-28 DIAGNOSIS — I428 Other cardiomyopathies: Secondary | ICD-10-CM

## 2021-07-28 DIAGNOSIS — I5022 Chronic systolic (congestive) heart failure: Secondary | ICD-10-CM

## 2021-07-28 LAB — CUP PACEART REMOTE DEVICE CHECK
Battery Remaining Longevity: 31 mo
Battery Voltage: 2.94 V
Brady Statistic RV Percent Paced: 4.8 %
Date Time Interrogation Session: 20230228010548
HighPow Impedance: 72 Ohm
Implantable Lead Implant Date: 20131211
Implantable Lead Implant Date: 20131211
Implantable Lead Implant Date: 20131211
Implantable Lead Location: 753858
Implantable Lead Location: 753859
Implantable Lead Location: 753860
Implantable Lead Model: 181
Implantable Lead Model: 4396
Implantable Lead Model: 5076
Implantable Lead Serial Number: 32342
Implantable Pulse Generator Implant Date: 20200724
Lead Channel Impedance Value: 1064 Ohm
Lead Channel Impedance Value: 342 Ohm
Lead Channel Impedance Value: 494 Ohm
Lead Channel Impedance Value: 570 Ohm
Lead Channel Impedance Value: 570 Ohm
Lead Channel Impedance Value: 608 Ohm
Lead Channel Pacing Threshold Amplitude: 0.625 V
Lead Channel Pacing Threshold Amplitude: 0.75 V
Lead Channel Pacing Threshold Amplitude: 2.375 V
Lead Channel Pacing Threshold Pulse Width: 0.4 ms
Lead Channel Pacing Threshold Pulse Width: 0.4 ms
Lead Channel Pacing Threshold Pulse Width: 1.5 ms
Lead Channel Sensing Intrinsic Amplitude: 2.9 mV
Lead Channel Sensing Intrinsic Amplitude: 9 mV
Lead Channel Setting Pacing Amplitude: 1.5 V
Lead Channel Setting Pacing Amplitude: 2.5 V
Lead Channel Setting Pacing Amplitude: 3 V
Lead Channel Setting Pacing Pulse Width: 0.4 ms
Lead Channel Setting Pacing Pulse Width: 1.5 ms
Lead Channel Setting Sensing Sensitivity: 0.45 mV

## 2021-08-04 NOTE — Progress Notes (Signed)
Remote ICD transmission.   

## 2021-08-24 ENCOUNTER — Other Ambulatory Visit: Payer: Self-pay | Admitting: Family Medicine

## 2021-10-23 ENCOUNTER — Other Ambulatory Visit: Payer: Self-pay | Admitting: Family Medicine

## 2021-10-23 ENCOUNTER — Other Ambulatory Visit: Payer: Self-pay | Admitting: Internal Medicine

## 2021-10-23 DIAGNOSIS — D649 Anemia, unspecified: Secondary | ICD-10-CM

## 2021-10-27 ENCOUNTER — Ambulatory Visit (INDEPENDENT_AMBULATORY_CARE_PROVIDER_SITE_OTHER): Payer: Medicare Other

## 2021-10-27 DIAGNOSIS — I428 Other cardiomyopathies: Secondary | ICD-10-CM

## 2021-10-27 DIAGNOSIS — I5022 Chronic systolic (congestive) heart failure: Secondary | ICD-10-CM

## 2021-10-27 LAB — CUP PACEART REMOTE DEVICE CHECK
Battery Remaining Longevity: 26 mo
Battery Voltage: 2.94 V
Brady Statistic RV Percent Paced: 4.79 %
Date Time Interrogation Session: 20230530013321
HighPow Impedance: 74 Ohm
Implantable Lead Implant Date: 20131211
Implantable Lead Implant Date: 20131211
Implantable Lead Implant Date: 20131211
Implantable Lead Location: 753858
Implantable Lead Location: 753859
Implantable Lead Location: 753860
Implantable Lead Model: 181
Implantable Lead Model: 4396
Implantable Lead Model: 5076
Implantable Lead Serial Number: 32342
Implantable Pulse Generator Implant Date: 20200724
Lead Channel Impedance Value: 1045 Ohm
Lead Channel Impedance Value: 342 Ohm
Lead Channel Impedance Value: 494 Ohm
Lead Channel Impedance Value: 570 Ohm
Lead Channel Impedance Value: 589 Ohm
Lead Channel Impedance Value: 608 Ohm
Lead Channel Pacing Threshold Amplitude: 0.625 V
Lead Channel Pacing Threshold Amplitude: 0.625 V
Lead Channel Pacing Threshold Amplitude: 2.375 V
Lead Channel Pacing Threshold Pulse Width: 0.4 ms
Lead Channel Pacing Threshold Pulse Width: 0.4 ms
Lead Channel Pacing Threshold Pulse Width: 1.5 ms
Lead Channel Sensing Intrinsic Amplitude: 3.3 mV
Lead Channel Sensing Intrinsic Amplitude: 9.6 mV
Lead Channel Setting Pacing Amplitude: 1.5 V
Lead Channel Setting Pacing Amplitude: 2.5 V
Lead Channel Setting Pacing Amplitude: 3 V
Lead Channel Setting Pacing Pulse Width: 0.4 ms
Lead Channel Setting Pacing Pulse Width: 1.5 ms
Lead Channel Setting Sensing Sensitivity: 0.45 mV

## 2021-11-06 NOTE — Progress Notes (Signed)
Remote ICD transmission.   

## 2021-11-16 ENCOUNTER — Other Ambulatory Visit: Payer: Self-pay | Admitting: Family Medicine

## 2021-11-16 DIAGNOSIS — D649 Anemia, unspecified: Secondary | ICD-10-CM

## 2021-11-20 ENCOUNTER — Other Ambulatory Visit: Payer: Self-pay | Admitting: Family Medicine

## 2021-12-09 ENCOUNTER — Other Ambulatory Visit: Payer: Self-pay | Admitting: Internal Medicine

## 2022-01-28 ENCOUNTER — Ambulatory Visit (INDEPENDENT_AMBULATORY_CARE_PROVIDER_SITE_OTHER): Payer: Medicare Other

## 2022-01-28 DIAGNOSIS — I428 Other cardiomyopathies: Secondary | ICD-10-CM | POA: Diagnosis not present

## 2022-02-01 LAB — CUP PACEART REMOTE DEVICE CHECK
Battery Remaining Longevity: 20 mo
Battery Voltage: 2.93 V
Brady Statistic RV Percent Paced: 6.61 %
Date Time Interrogation Session: 20230831063517
HighPow Impedance: 84 Ohm
Implantable Lead Implant Date: 20131211
Implantable Lead Implant Date: 20131211
Implantable Lead Implant Date: 20131211
Implantable Lead Location: 753858
Implantable Lead Location: 753859
Implantable Lead Location: 753860
Implantable Lead Model: 181
Implantable Lead Model: 4396
Implantable Lead Model: 5076
Implantable Lead Serial Number: 32342
Implantable Pulse Generator Implant Date: 20200724
Lead Channel Impedance Value: 1064 Ohm
Lead Channel Impedance Value: 380 Ohm
Lead Channel Impedance Value: 532 Ohm
Lead Channel Impedance Value: 608 Ohm
Lead Channel Impedance Value: 646 Ohm
Lead Channel Impedance Value: 646 Ohm
Lead Channel Pacing Threshold Amplitude: 0.625 V
Lead Channel Pacing Threshold Amplitude: 0.625 V
Lead Channel Pacing Threshold Amplitude: 2.375 V
Lead Channel Pacing Threshold Pulse Width: 0.4 ms
Lead Channel Pacing Threshold Pulse Width: 0.4 ms
Lead Channel Pacing Threshold Pulse Width: 1.5 ms
Lead Channel Sensing Intrinsic Amplitude: 10 mV
Lead Channel Sensing Intrinsic Amplitude: 3.1 mV
Lead Channel Setting Pacing Amplitude: 1.5 V
Lead Channel Setting Pacing Amplitude: 2.5 V
Lead Channel Setting Pacing Amplitude: 3 V
Lead Channel Setting Pacing Pulse Width: 0.4 ms
Lead Channel Setting Pacing Pulse Width: 1.5 ms
Lead Channel Setting Sensing Sensitivity: 0.45 mV

## 2022-02-05 ENCOUNTER — Other Ambulatory Visit: Payer: Self-pay | Admitting: Cardiovascular Disease

## 2022-02-05 ENCOUNTER — Telehealth (HOSPITAL_BASED_OUTPATIENT_CLINIC_OR_DEPARTMENT_OTHER): Payer: Self-pay | Admitting: Cardiovascular Disease

## 2022-02-05 NOTE — Telephone Encounter (Signed)
Called patient to schedule follow up with Dr. Oval Linsey and left a v/m.

## 2022-02-18 NOTE — Progress Notes (Signed)
Remote ICD transmission.   

## 2022-02-19 ENCOUNTER — Other Ambulatory Visit: Payer: Self-pay | Admitting: Internal Medicine

## 2022-02-20 ENCOUNTER — Other Ambulatory Visit: Payer: Self-pay | Admitting: Family Medicine

## 2022-02-20 DIAGNOSIS — D649 Anemia, unspecified: Secondary | ICD-10-CM

## 2022-04-26 ENCOUNTER — Ambulatory Visit (INDEPENDENT_AMBULATORY_CARE_PROVIDER_SITE_OTHER): Payer: Medicare Other | Admitting: Family Medicine

## 2022-04-26 ENCOUNTER — Ambulatory Visit (INDEPENDENT_AMBULATORY_CARE_PROVIDER_SITE_OTHER): Payer: Medicare Other

## 2022-04-26 VITALS — BP 122/84 | HR 69 | Temp 98.7°F | Ht 63.0 in | Wt 188.6 lb

## 2022-04-26 DIAGNOSIS — F439 Reaction to severe stress, unspecified: Secondary | ICD-10-CM

## 2022-04-26 DIAGNOSIS — G8929 Other chronic pain: Secondary | ICD-10-CM

## 2022-04-26 DIAGNOSIS — M25511 Pain in right shoulder: Secondary | ICD-10-CM

## 2022-04-26 DIAGNOSIS — M25512 Pain in left shoulder: Secondary | ICD-10-CM

## 2022-04-26 DIAGNOSIS — E782 Mixed hyperlipidemia: Secondary | ICD-10-CM | POA: Diagnosis not present

## 2022-04-26 DIAGNOSIS — Z23 Encounter for immunization: Secondary | ICD-10-CM

## 2022-04-26 DIAGNOSIS — M25561 Pain in right knee: Secondary | ICD-10-CM | POA: Diagnosis not present

## 2022-04-26 DIAGNOSIS — M1711 Unilateral primary osteoarthritis, right knee: Secondary | ICD-10-CM | POA: Diagnosis not present

## 2022-04-26 DIAGNOSIS — I10 Essential (primary) hypertension: Secondary | ICD-10-CM | POA: Diagnosis not present

## 2022-04-26 DIAGNOSIS — J302 Other seasonal allergic rhinitis: Secondary | ICD-10-CM

## 2022-04-26 DIAGNOSIS — R7303 Prediabetes: Secondary | ICD-10-CM | POA: Diagnosis not present

## 2022-04-26 DIAGNOSIS — R600 Localized edema: Secondary | ICD-10-CM | POA: Diagnosis not present

## 2022-04-26 DIAGNOSIS — Z0000 Encounter for general adult medical examination without abnormal findings: Secondary | ICD-10-CM | POA: Diagnosis not present

## 2022-04-26 DIAGNOSIS — K5900 Constipation, unspecified: Secondary | ICD-10-CM

## 2022-04-26 LAB — LIPID PANEL
Cholesterol: 162 mg/dL (ref 0–200)
HDL: 42.6 mg/dL (ref >39.00)
LDL Cholesterol: 99 mg/dL (ref 0–99)
NonHDL: 119.18
Total CHOL/HDL Ratio: 4
Triglycerides: 100 mg/dL (ref 0.0–149.0)
VLDL: 20 mg/dL (ref 0.0–40.0)

## 2022-04-26 LAB — CBC WITH DIFFERENTIAL/PLATELET
Basophils Absolute: 0 10*3/uL (ref 0.0–0.1)
Basophils Relative: 0.9 % (ref 0.0–3.0)
Eosinophils Absolute: 0.1 10*3/uL (ref 0.0–0.7)
Eosinophils Relative: 2.9 % (ref 0.0–5.0)
HCT: 34.5 % — ABNORMAL LOW (ref 36.0–46.0)
Hemoglobin: 12 g/dL (ref 12.0–15.0)
Lymphocytes Relative: 41.6 % (ref 12.0–46.0)
Lymphs Abs: 1.8 10*3/uL (ref 0.7–4.0)
MCHC: 34.9 g/dL (ref 30.0–36.0)
MCV: 94.5 fl (ref 78.0–100.0)
Monocytes Absolute: 0.5 10*3/uL (ref 0.1–1.0)
Monocytes Relative: 10.8 % (ref 3.0–12.0)
Neutro Abs: 1.9 10*3/uL (ref 1.4–7.7)
Neutrophils Relative %: 43.8 % (ref 43.0–77.0)
Platelets: 368 10*3/uL (ref 150.0–400.0)
RBC: 3.65 Mil/uL — ABNORMAL LOW (ref 3.87–5.11)
RDW: 13.4 % (ref 11.5–15.5)
WBC: 4.4 10*3/uL (ref 4.0–10.5)

## 2022-04-26 LAB — BASIC METABOLIC PANEL
BUN: 8 mg/dL (ref 6–23)
CO2: 30 mEq/L (ref 19–32)
Calcium: 9.7 mg/dL (ref 8.4–10.5)
Chloride: 104 mEq/L (ref 96–112)
Creatinine, Ser: 0.89 mg/dL (ref 0.40–1.20)
GFR: 65.65 mL/min (ref >60.00)
Glucose, Bld: 90 mg/dL (ref 70–99)
Potassium: 3.8 mEq/L (ref 3.5–5.1)
Sodium: 140 mEq/L (ref 135–145)

## 2022-04-26 LAB — TSH: TSH: 1.71 u[IU]/mL (ref 0.35–5.50)

## 2022-04-26 LAB — HEMOGLOBIN A1C: Hgb A1c MFr Bld: 6.4 % (ref 4.6–6.5)

## 2022-04-26 MED ORDER — FEXOFENADINE HCL 180 MG PO TABS: 180.0000 mg | ORAL_TABLET | Freq: Every day | ORAL | 1 refills | Status: DC

## 2022-04-26 NOTE — Patient Instructions (Addendum)
You can use over-the-counter MiraLAX for constipation.  It is a powder that you put in something you are drinking.  Typically MiraLAX is more of a delayed reaction meaning you will likely not have a bowel movement that same day but may be a day or so later.  You can take this daily and then reduce the amount you are taking once you start having regular BMs.  You can also use over-the-counter Allegra, Zyrtec, or Claritin for your allergies.  These are antihistamines which help reduce the runny nose, postnasal drainage, and other allergy associated symptoms.  For some people allergy medications cause drowsiness and urinary retention.  You can take half a tab of the Allegra to see if you notice improvement in symptoms.  A prescription was sent to her local pharmacy.  Consider counseling as an option to help with current distress.  Behavioral Health Services: -to make an appointment contact the office/provider you are interested in seeing.  No referral is needed.  The below is not an all inclusive list, but will help you get started.  [email or web address] -counseling located off of [address].  [email or web address] -website helps you find providers in your area  [facility] -Located off of [address]. across from [place]  Dr. Darleene Cleaver is a Teacher, music with [facility]. [phone]  [facility]  [phone] -a place in town that has counseling and Psychiatry services.    [facility].  Faith based counseling.  Reed Breech Cottle, LPC Typically works in the office, but doing a hybrid schedule due to COVID-19. Phone number is [phone] for appointments.

## 2022-04-26 NOTE — Progress Notes (Signed)
Subjective:     Cynthia Roth is a 71 y.o. female and is here for a comprehensive physical exam.  Pt notes increased stress as a caregiver and from family in general.  Pt having b/l shoulder pain, R knee pain. Using topical rubs.  Pt with intermittent constipation.  Increased allergy symptoms.  Interested in flu vaccine.  Social History   Socioeconomic History   Marital status: Divorced    Spouse name: n/a   Number of children: 1   Years of education: Master's   Highest education level: Not on file  Occupational History   Occupation: Conservator, museum/gallery  Tobacco Use   Smoking status: Never   Smokeless tobacco: Never  Vaping Use   Vaping Use: Never used  Substance and Sexual Activity   Alcohol use: No    Alcohol/week: 0.0 standard drinks of alcohol   Drug use: No   Sexual activity: Never  Other Topics Concern   Not on file  Social History Narrative   Lives in [place], and her mother, grandmother and one brother live with her. Divorced with one adult son who lives in [place], [place]. One brother lives in [place], [place]. Another lives in [place]. 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   Social Determinants of Health   Financial Resource Strain: Low Risk  (04/16/2021)   Overall Financial Resource Strain (CARDIA)    Difficulty of Paying Living Expenses: Not hard at all  Food Insecurity: No Food Insecurity (04/16/2021)   Hunger Vital Sign    Worried About Running Out of Food in the Last Year: Never true    Ran Out of Food in the Last Year: Never true  Transportation Needs: No Transportation Needs (04/16/2021)   PRAPARE - Hydrologist (Medical): No    Lack of Transportation (Non-Medical): No  Physical Activity: Insufficiently Active (04/16/2021)   Exercise Vital Sign    Days of Exercise per Week: 5 days    Minutes of Exercise per Session: 20 min  Stress: No Stress Concern  Present (04/16/2021)   [place]    Feeling of Stress : Not at all  Social Connections: Moderately Integrated (04/16/2021)   Social Connection and Isolation Panel [NHANES]    Frequency of Communication with Friends and Family: More than three times a week    Frequency of Social Gatherings with Friends and Family: More than three times a week    Attends Religious Services: More than 4 times per year    Active Member of Clubs or Organizations: Yes    Attends Archivist Meetings: More than 4 times per year    Marital Status: Divorced  Intimate Partner Violence: Not At Risk (04/16/2021)   Humiliation, Afraid, Rape, and Kick questionnaire    Fear of Current or Ex-Partner: No    Emotionally Abused: No    Physically Abused: No    Sexually Abused: No   Health Maintenance  Topic Date Due   Zoster Vaccines- Shingrix (1 of 2) Never done   Pneumonia Vaccine 73+ Years old (2 - PPSV23 or PCV20) 02/26/2021   INFLUENZA VACCINE  12/29/2021   COVID-19 Vaccine (4 - 2023-24 season) 01/29/2022   Medicare Annual Wellness (AWV)  04/16/2022   MAMMOGRAM  06/24/2022   COLONOSCOPY (Pts 45-9yr Insurance coverage will need to be confirmed)  03/18/2027   DEXA SCAN  Completed  Hepatitis C Screening  Completed   HPV VACCINES  Aged Out    The following portions of the patient's history were reviewed and updated as appropriate: allergies, current medications, past family history, past medical history, past social history, past surgical history, and problem list.  Review of Systems Pertinent items noted in HPI and remainder of comprehensive ROS otherwise negative.   Objective:    BP 122/84 (BP Location: Right Arm, Patient Position: Sitting, Cuff Size: Normal)   Pulse 69   Temp 98.7 F (37.1 C) (Oral)   Ht '5\' 3"'$  (1.6 m)   Wt 188 lb 9.6 oz (85.5 kg)   SpO2 96%   BMI 33.41 kg/m  General appearance: alert and no distress Head:  Normocephalic, without obvious abnormality, atraumatic Eyes: conjunctivae/corneas clear. PERRL, EOM's intact. Fundi benign. Ears: normal TM's and external ear canals both ears Nose: Nares normal. Septum midline. Mucosa normal. No drainage or sinus tenderness. Throat: lips, mucosa, and tongue normal; teeth and gums normal Neck: no adenopathy, no carotid bruit, no JVD, supple, symmetrical, trachea midline, and thyroid not enlarged, symmetric, no tenderness/mass/nodules Lungs: clear to auscultation bilaterally Heart: regular rate and rhythm, S1, S2 normal, no murmur, click, rub or gallop Abdomen: soft, non-tender; bowel sounds normal; no masses,  no organomegaly Extremities: extremities normal, atraumatic, no cyanosis or edema Pulses: 2+ and symmetric Skin: Skin color, texture, turgor normal. No rashes or lesions Lymph nodes: Cervical, supraclavicular, and axillary nodes normal. Neurologic: Alert and oriented X 3, normal strength and tone. Normal symmetric reflexes. Normal coordination and gait       04/26/2022   11:01 AM 04/16/2021    8:31 AM 03/03/2020    9:19 AM  Depression screen PHQ 2/9  Decreased Interest  0 0  Down, Depressed, Hopeless 1 0 0  PHQ - 2 Score 1 0 0  Altered sleeping 0  0  Tired, decreased energy 3  0  Change in appetite 1  0  Feeling bad or failure about yourself  1  0  Trouble concentrating 0  0  Moving slowly or fidgety/restless 0  0  Suicidal thoughts 0  0  PHQ-9 Score 6  0  Difficult doing work/chores   Not difficult at all    Assessment:    Healthy female exam.      Plan:    Anticipatory guidance given including wearing seatbelts, smoke detectors in the home, increasing physical activity, increasing p.o. intake of water and vegetables. -labs -mammogram due.  To be scheduled by pt -last pap 02/28/2020 with OB/Gyn -colonoscopy done 03/17/2017 -immunizations reviewed -given handout -next CPE in 1 yr See After Visit Summary for Counseling  Recommendations   Well adult exam  Need for influenza vaccination  - Plan: Flu Vaccine QUAD High Dose(Fluad)  Stress -pt encouraged to set boundaries with family -advised to consider counseling.  Given info on area [facility] providers  - Plan: TSH  Chronic pain of right knee -likely 2/2 OA -continue topical analgesics, heat, tylenol arthritis strength, etc. -f/u with Ortho  - Plan: CBC with Differential/Platelet, DG Knee Complete 4 Views Right  Acute pain of both shoulders  -supportive care  -Ortho f/u - Plan: CBC with Differential/Platelet  Mixed hyperlipidemia -continue lifestyle modifications -continue lipitor 20 mg  - Plan: CBC with Differential/Platelet, Lipid panel  Prediabetes  - Plan: Hemoglobin A1c  Essential hypertension  -controlled -continue current meds including lisinopril 10 mg, spironolactone, coreg 12.5 mg BID, Bidil - Plan: CBC with Differential/Platelet, Basic metabolic panel, TSH  Seasonal allergies  - Plan: fexofenadine (ALLEGRA) 180 MG tablet  Constipation, unspecified constipation type -lifestyle modifications -miralax daily prn  F/u in 2 months  Grier Mitts, MD

## 2022-04-29 ENCOUNTER — Ambulatory Visit (INDEPENDENT_AMBULATORY_CARE_PROVIDER_SITE_OTHER): Payer: Medicare Other

## 2022-04-29 ENCOUNTER — Telehealth: Payer: Self-pay | Admitting: Family Medicine

## 2022-04-29 DIAGNOSIS — I428 Other cardiomyopathies: Secondary | ICD-10-CM | POA: Diagnosis not present

## 2022-04-29 NOTE — Telephone Encounter (Signed)
Patient stopped by wanted her xray results from her appointment with Dr.Banks. She is unable to get into her mychart and will need a call. Please leave a voicemail if patient does not answer.        Please advise

## 2022-04-30 NOTE — Telephone Encounter (Signed)
Per result note for imaging done on knee on 04/26/22: Arthritis noted in knee.   Attempted to call patient to give results. Please transfer to [place] if pt returns call.

## 2022-05-01 LAB — CUP PACEART REMOTE DEVICE CHECK
Battery Remaining Longevity: 19 mo
Battery Voltage: 2.92 V
Brady Statistic RV Percent Paced: 5.76 %
Date Time Interrogation Session: 20231130000519
HighPow Impedance: 71 Ohm
Implantable Lead Connection Status: 753985
Implantable Lead Connection Status: 753985
Implantable Lead Connection Status: 753985
Implantable Lead Implant Date: 20131211
Implantable Lead Implant Date: 20131211
Implantable Lead Implant Date: 20131211
Implantable Lead Location: 753858
Implantable Lead Location: 753859
Implantable Lead Location: 753860
Implantable Lead Model: 181
Implantable Lead Model: 4396
Implantable Lead Model: 5076
Implantable Lead Serial Number: 32342
Implantable Pulse Generator Implant Date: 20200724
Lead Channel Impedance Value: 342 Ohm
Lead Channel Impedance Value: 475 Ohm
Lead Channel Impedance Value: 551 Ohm
Lead Channel Impedance Value: 551 Ohm
Lead Channel Impedance Value: 551 Ohm
Lead Channel Impedance Value: 969 Ohm
Lead Channel Pacing Threshold Amplitude: 0.625 V
Lead Channel Pacing Threshold Amplitude: 0.75 V
Lead Channel Pacing Threshold Amplitude: 2.375 V
Lead Channel Pacing Threshold Pulse Width: 0.4 ms
Lead Channel Pacing Threshold Pulse Width: 0.4 ms
Lead Channel Pacing Threshold Pulse Width: 1.5 ms
Lead Channel Sensing Intrinsic Amplitude: 10.1 mV
Lead Channel Sensing Intrinsic Amplitude: 2.6 mV
Lead Channel Setting Pacing Amplitude: 1.5 V
Lead Channel Setting Pacing Amplitude: 2.5 V
Lead Channel Setting Pacing Amplitude: 3 V
Lead Channel Setting Pacing Pulse Width: 0.4 ms
Lead Channel Setting Pacing Pulse Width: 1.5 ms
Lead Channel Setting Sensing Sensitivity: 0.45 mV
Zone Setting Status: 755011
Zone Setting Status: 755011

## 2022-05-03 ENCOUNTER — Other Ambulatory Visit: Payer: Self-pay | Admitting: Cardiovascular Disease

## 2022-05-03 NOTE — Telephone Encounter (Signed)
Rx request sent to pharmacy.  

## 2022-05-04 NOTE — Telephone Encounter (Signed)
See result note.  

## 2022-05-09 ENCOUNTER — Encounter: Payer: Self-pay | Admitting: Family Medicine

## 2022-05-19 NOTE — Progress Notes (Signed)
Remote ICD transmission.   

## 2022-05-20 ENCOUNTER — Other Ambulatory Visit: Payer: Self-pay | Admitting: Family Medicine

## 2022-05-20 DIAGNOSIS — J302 Other seasonal allergic rhinitis: Secondary | ICD-10-CM

## 2022-05-21 ENCOUNTER — Other Ambulatory Visit: Payer: Self-pay | Admitting: Internal Medicine

## 2022-06-22 ENCOUNTER — Other Ambulatory Visit: Payer: Self-pay | Admitting: Internal Medicine

## 2022-07-07 ENCOUNTER — Other Ambulatory Visit: Payer: Self-pay | Admitting: Internal Medicine

## 2022-07-08 NOTE — Telephone Encounter (Signed)
Pt. Last seen in 2022, over due for appointment, refill refused. Message sent to patient to call and schedule.

## 2022-07-08 NOTE — Telephone Encounter (Signed)
Dr Oval Linsey pt. Please address. Thank you

## 2022-07-16 IMAGING — MG MM DIGITAL SCREENING BILAT W/ TOMO AND CAD
6 of 10 series · 6 of 30 positions shown · non-contrast
Comparison: Previous exam(s).

CLINICAL DATA: Screening.

EXAM:
DIGITAL SCREENING BILATERAL MAMMOGRAM WITH TOMO AND CAD

[L MLO synth-2D (1 of 2)]
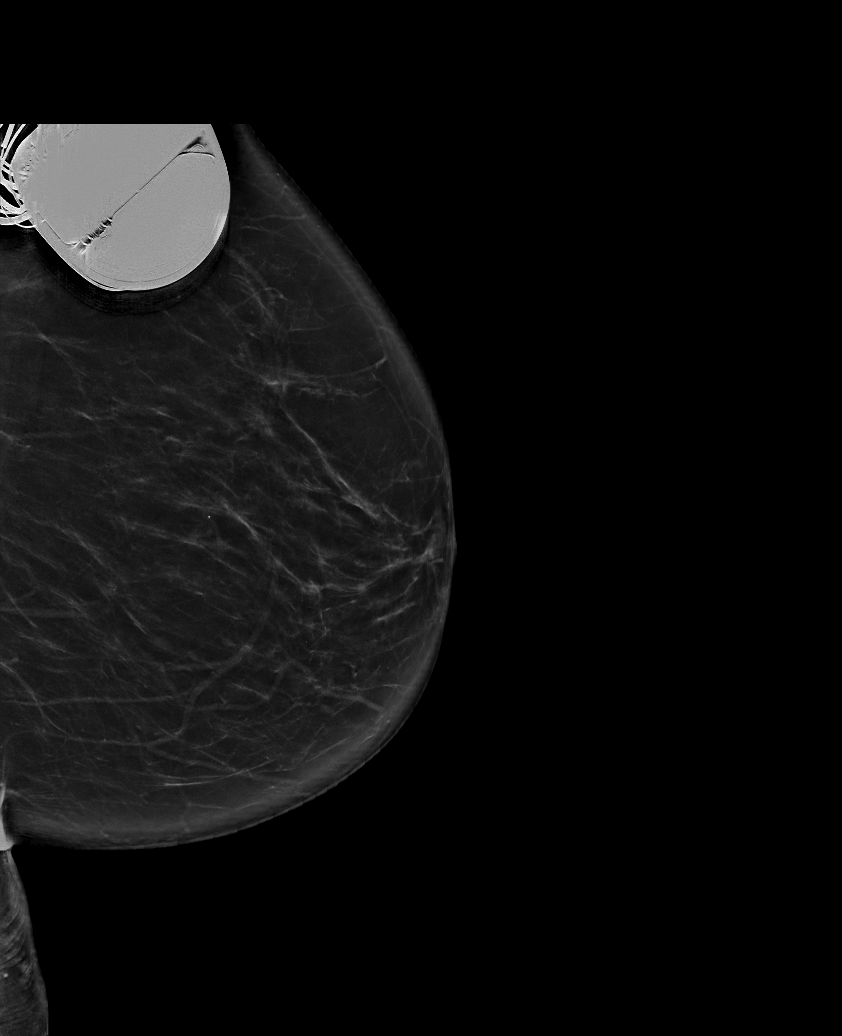

[R MLO synth-2D]
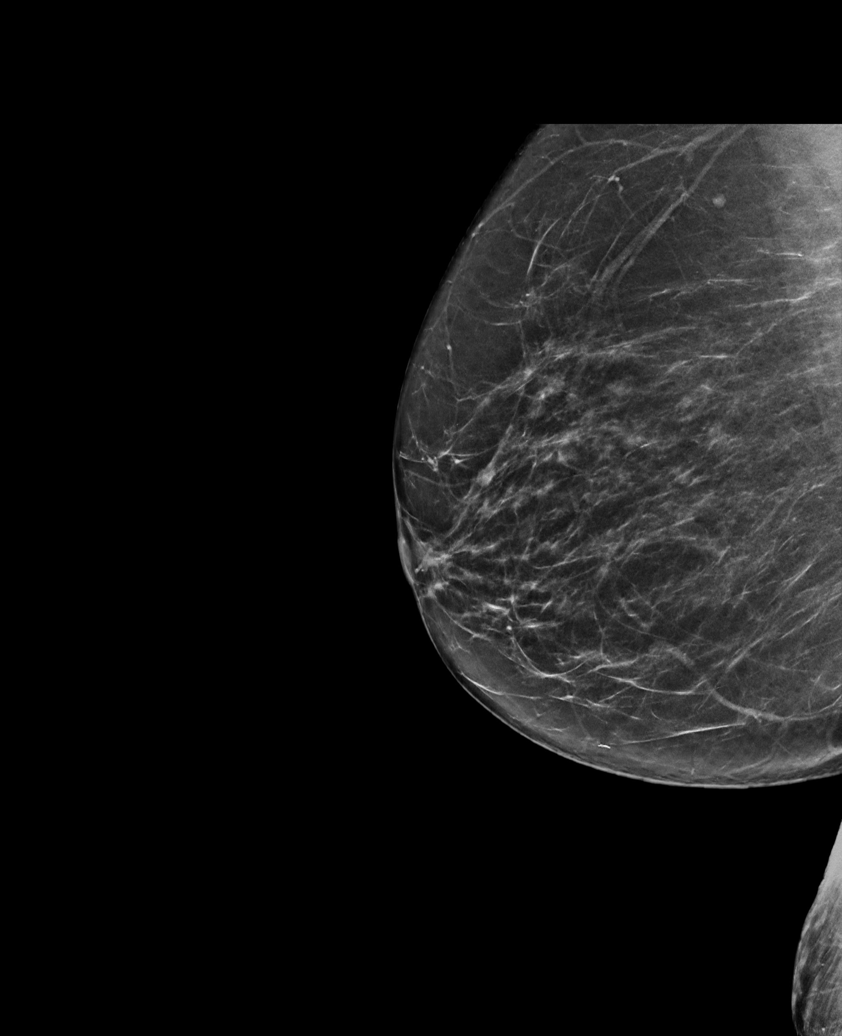

[L CC synth-2D]
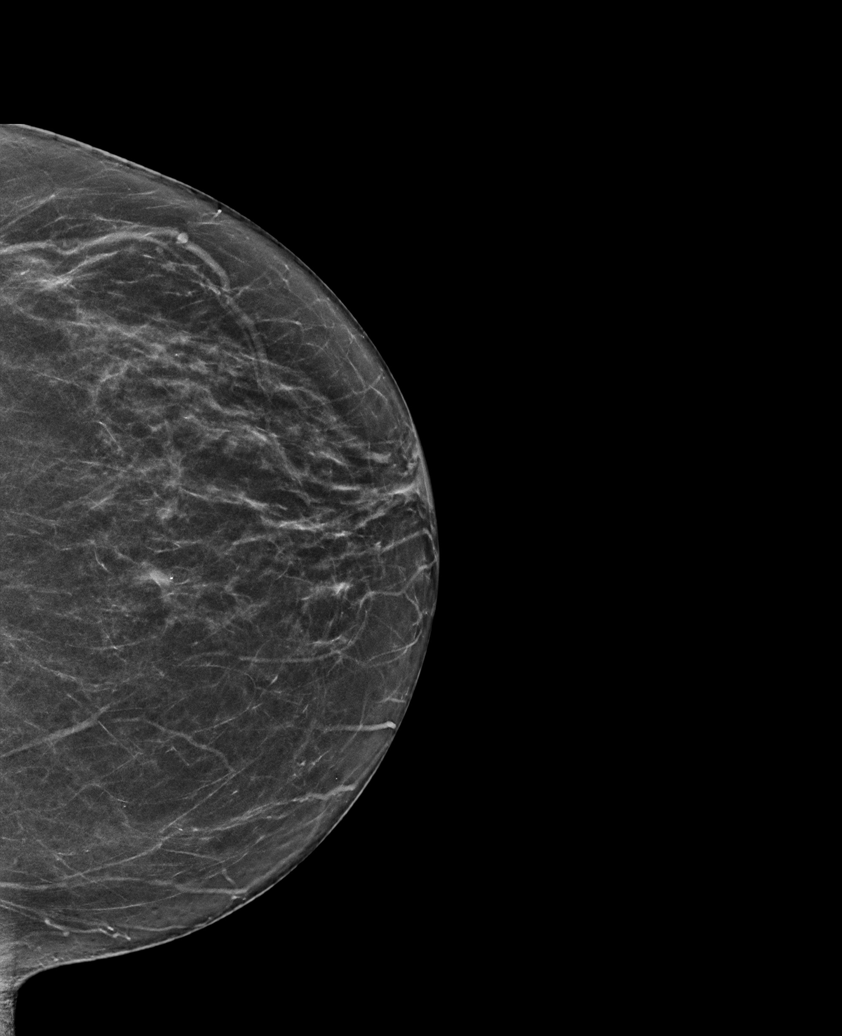

[L MLO synth-2D (2 of 2)]
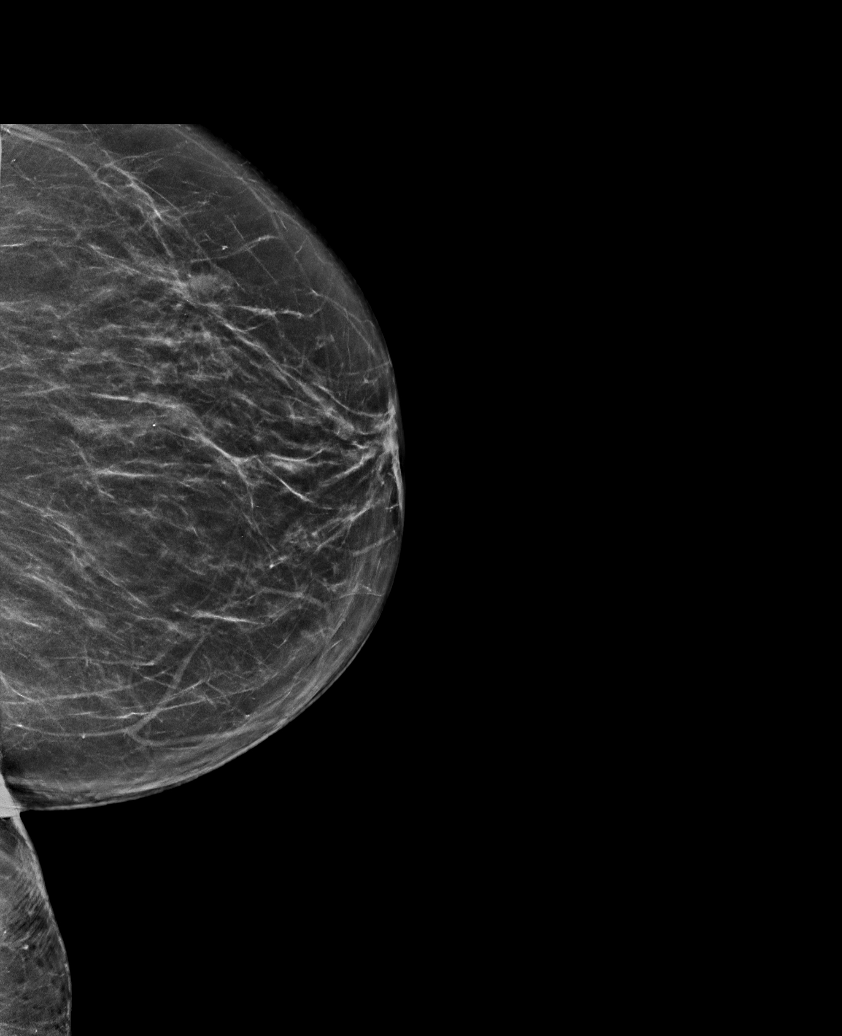

[R CC synth-2D]
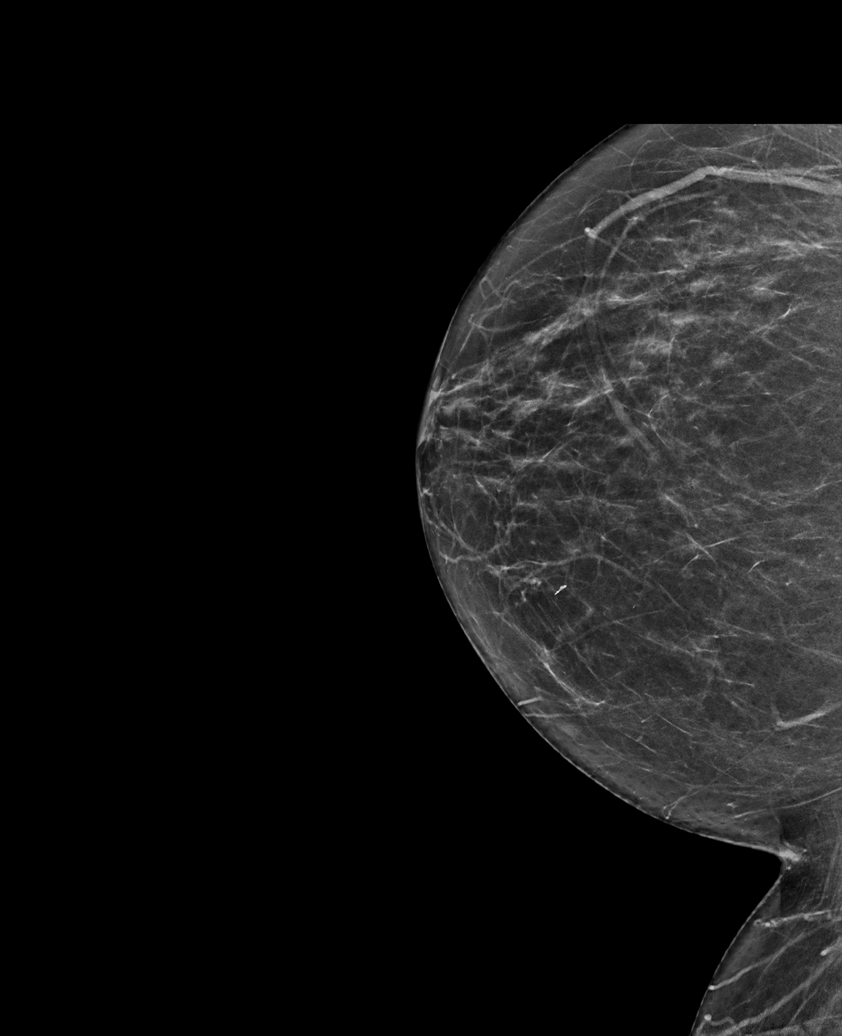

[R CC tomo · tomo slice 33/64.0]
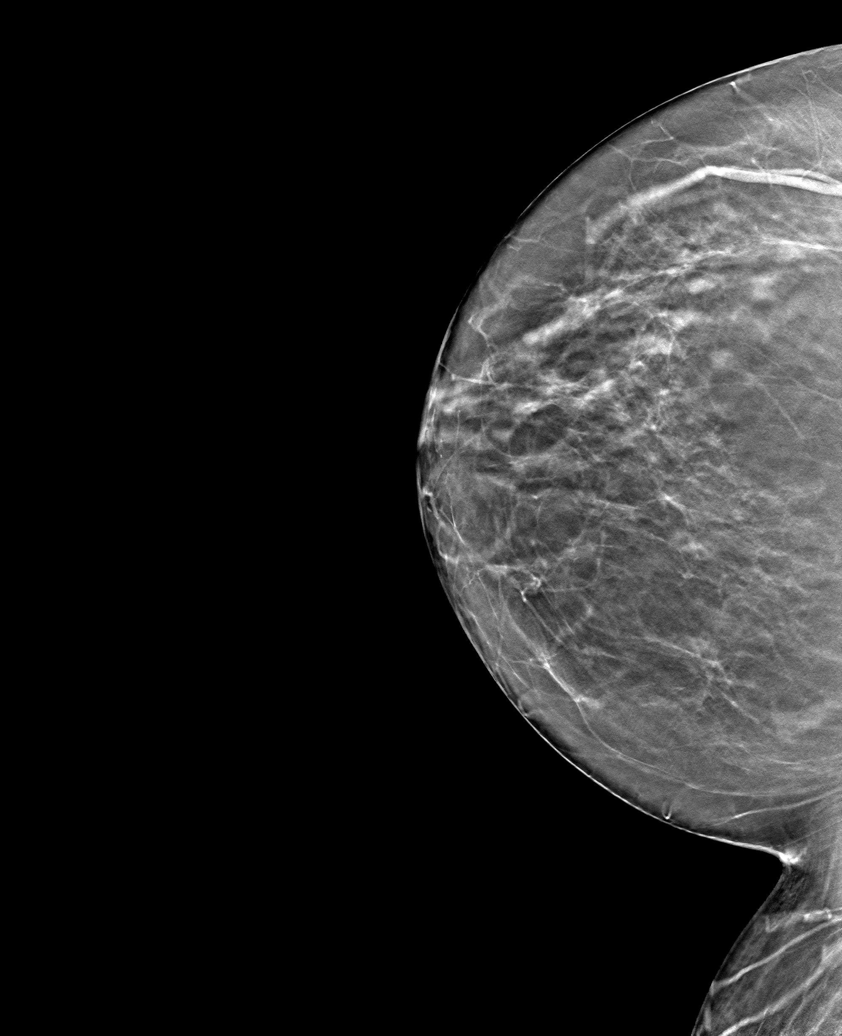

[6 of 30 positions shown; findings below may reference images not displayed]

ACR Breast Density Category b: There are scattered areas of
fibroglandular density.
FINDINGS: There are no findings suspicious for malignancy. The images were
evaluated with computer-aided detection.
IMPRESSION: No mammographic evidence of malignancy. A result letter of this
screening mammogram will be mailed directly to the patient.

RECOMMENDATION:
Screening mammogram in one year. (Code:ZP-7-VX7)

BI-RADS CATEGORY  1: Negative.

## 2022-07-24 ENCOUNTER — Other Ambulatory Visit: Payer: Self-pay | Admitting: Cardiovascular Disease

## 2022-07-28 ENCOUNTER — Other Ambulatory Visit: Payer: Self-pay | Admitting: Family Medicine

## 2022-07-28 ENCOUNTER — Other Ambulatory Visit: Payer: Self-pay | Admitting: Internal Medicine

## 2022-07-29 ENCOUNTER — Ambulatory Visit (INDEPENDENT_AMBULATORY_CARE_PROVIDER_SITE_OTHER): Payer: Medicare Other

## 2022-07-29 DIAGNOSIS — I428 Other cardiomyopathies: Secondary | ICD-10-CM

## 2022-07-29 LAB — CUP PACEART REMOTE DEVICE CHECK
Battery Remaining Longevity: 18 mo
Battery Voltage: 2.92 V
Brady Statistic RV Percent Paced: 5.75 %
Date Time Interrogation Session: 20240228235638
HighPow Impedance: 84 Ohm
Implantable Lead Connection Status: 753985
Implantable Lead Connection Status: 753985
Implantable Lead Connection Status: 753985
Implantable Lead Implant Date: 20131211
Implantable Lead Implant Date: 20131211
Implantable Lead Implant Date: 20131211
Implantable Lead Location: 753858
Implantable Lead Location: 753859
Implantable Lead Location: 753860
Implantable Lead Model: 181
Implantable Lead Model: 4396
Implantable Lead Model: 5076
Implantable Lead Serial Number: 32342
Implantable Pulse Generator Implant Date: 20200724
Lead Channel Impedance Value: 1045 Ohm
Lead Channel Impedance Value: 342 Ohm
Lead Channel Impedance Value: 551 Ohm
Lead Channel Impedance Value: 551 Ohm
Lead Channel Impedance Value: 551 Ohm
Lead Channel Impedance Value: 570 Ohm
Lead Channel Pacing Threshold Amplitude: 0.75 V
Lead Channel Pacing Threshold Amplitude: 0.75 V
Lead Channel Pacing Threshold Amplitude: 2.375 V
Lead Channel Pacing Threshold Pulse Width: 0.4 ms
Lead Channel Pacing Threshold Pulse Width: 0.4 ms
Lead Channel Pacing Threshold Pulse Width: 1.5 ms
Lead Channel Sensing Intrinsic Amplitude: 3 mV
Lead Channel Sensing Intrinsic Amplitude: 8.8 mV
Lead Channel Setting Pacing Amplitude: 1.5 V
Lead Channel Setting Pacing Amplitude: 2.5 V
Lead Channel Setting Pacing Amplitude: 3 V
Lead Channel Setting Pacing Pulse Width: 0.4 ms
Lead Channel Setting Pacing Pulse Width: 1.5 ms
Lead Channel Setting Sensing Sensitivity: 0.45 mV
Zone Setting Status: 755011
Zone Setting Status: 755011

## 2022-08-02 ENCOUNTER — Other Ambulatory Visit: Payer: Self-pay | Admitting: Family Medicine

## 2022-08-02 DIAGNOSIS — Z1231 Encounter for screening mammogram for malignant neoplasm of breast: Secondary | ICD-10-CM

## 2022-08-05 ENCOUNTER — Ambulatory Visit: Payer: Medicare Other

## 2022-08-12 ENCOUNTER — Ambulatory Visit: Payer: Medicare Other | Attending: Internal Medicine | Admitting: Internal Medicine

## 2022-08-12 ENCOUNTER — Encounter: Payer: Self-pay | Admitting: Internal Medicine

## 2022-08-12 VITALS — BP 114/78 | HR 83 | Ht 63.0 in | Wt 191.6 lb

## 2022-08-12 DIAGNOSIS — Z95810 Presence of automatic (implantable) cardiac defibrillator: Secondary | ICD-10-CM

## 2022-08-12 DIAGNOSIS — Z79899 Other long term (current) drug therapy: Secondary | ICD-10-CM | POA: Diagnosis not present

## 2022-08-12 DIAGNOSIS — I428 Other cardiomyopathies: Secondary | ICD-10-CM | POA: Diagnosis not present

## 2022-08-12 DIAGNOSIS — I5022 Chronic systolic (congestive) heart failure: Secondary | ICD-10-CM

## 2022-08-12 MED ORDER — METOPROLOL SUCCINATE ER 25 MG PO TB24: 25.0000 mg | ORAL_TABLET | Freq: Every day | ORAL | 3 refills | Status: DC

## 2022-08-12 MED ORDER — SPIRONOLACTONE 25 MG PO TABS: 12.5000 mg | ORAL_TABLET | Freq: Every day | ORAL | 3 refills | Status: DC

## 2022-08-12 NOTE — Progress Notes (Signed)
Patient Care Team: [name], MD as PCP - General (Family Medicine) Skeet Latch, MD as PCP - Cardiology (Cardiology) Deboraha Sprang, MD as PCP - Electrophysiology (Cardiology) Larey Dresser, MD as Consulting Physician (Cardiology) Juanita Craver, MD as Consulting Physician (Gastroenterology)   HPI  Cynthia Roth is a 72 y.o. female Seen in followup for congestive heart failure with CRT-D implantation for nonischemic cardiomyopathy 12/13; s/p gen change 7/20   interval normalization of LV function   Denies chest pain, chronic mild sob but lightheadedness and fatigue  DATE TEST EF   10/13 Echo   15 %   1/17 Echo   55-60 %   8/18 Echo  50-55%   10/22 Echo  50-55% Asymmetric LVH (See Below)       Date Cr K Hgb  2/16   13.8  8/18 1.31 4.7 9.9  11/20  1.78 5.1 10.3  9/21 1.46  10.1  11/23 0.89 3.8 12.0     Past Medical History:  Diagnosis Date   AICD (automatic cardioverter/defibrillator) present    Allergy    Biventricular ICD -Medtronic    DOI 12/13 - Medtronic Viva XR CRT-D defibrillator, serial #[id number].     CAD in native artery 02/22/2021   CHF (congestive heart failure) ([facility])    Chronic systolic heart failure ([facility]) 12/02/2011   Colon polyps    Dr Mar Daring [facility]   Dyslipidemia 12/02/2011   Gout    "very seldom" (05/10/2012)   Hx of colonic polyps    Hyperlipidemia    Hypertension    "used to have this; now my BP is low" (05/10/2012)   LBBB (left bundle branch block) 12/02/2011   Nonischemic cardiomyopathy ([place])    Obesity (BMI 30.0-34.9) 06/11/2020   Pre-diabetes    Prediabetes    Presence of permanent cardiac pacemaker    Tubular adenoma 1999   history of    Past Surgical History:  Procedure Laterality Date   BI-VENTRICULAR IMPLANTABLE CARDIOVERTER DEFIBRILLATOR N/A 05/10/2012   Procedure: BI-VENTRICULAR IMPLANTABLE CARDIOVERTER DEFIBRILLATOR  (CRT-D);  Surgeon: Deboraha Sprang, MD;  [facility] C[facility] LAB;  Service:  Cardiovascular;  Laterality: N/A;   BIV ICD GENERATOR CHANGEOUT N/A 12/22/2018   Procedure: BIV ICD GENERATOR CHANGEOUT;  Surgeon: Deboraha Sprang, MD;  Location: [facility] CV LAB;  Service: Cardiovascular;  Laterality: N/A;   CARDIAC CATHETERIZATION  12-03-11   selectie coronary angiography   CARDIAC DEFIBRILLATOR PLACEMENT  05/10/2012   CRT-D implantation (05/10/2012)   COLONOSCOPY WITH PROPOFOL N/A 03/17/2017   Procedure: COLONOSCOPY WITH PROPOFOL;  Surgeon: Juanita Craver, MD;  Location: [facility] ENDOSCOPY;  Service: Endoscopy;  Laterality: N/A;   LEFT HEART CATHETERIZATION WITH CORONARY ANGIOGRAM N/A 12/03/2011   Procedure: LEFT HEART CATHETERIZATION WITH CORONARY ANGIOGRAM;  Surgeon: Larey Dresser, MD;  Location: [facility] CATH LAB;  Service: Cardiovascular;  Laterality: N/A;   PARTIAL HYSTERECTOMY  1980's    Current Outpatient Medications  Medication Sig Dispense Refill   aspirin 81 MG tablet Take 81 mg by mouth daily.     atorvastatin (LIPITOR) 20 MG tablet Take 1 tablet (20 mg total) by mouth daily. Please schedule appointment with Dr. Oval Linsey for refills. 2nd attempt 30 tablet 0   carvedilol (COREG) 12.5 MG tablet TAKE 1 TABLET BY MOUTH 2 (TWO) TIMES DAILY WITH A MEAL. CALL FOR OFFICE VISIT [phone] 180 tablet 3   fexofenadine (ALLEGRA) 180 MG tablet TAKE 1 TABLET BY MOUTH EVERY DAY (Patient taking differently:  Take 180 mg by mouth daily. prn) 90 tablet 1   furosemide (LASIX) 20 MG tablet TAKE 1 TABLET BY MOUTH EVERY OTHER DAY 30 tablet 0   isosorbide-hydrALAZINE (BIDIL) 20-37.5 MG tablet Take by mouth as directed. TAKE 1/2 TABLET TWICE A DAY     lisinopril (ZESTRIL) 10 MG tablet TAKE 1 TABLET BY MOUTH EVERYDAY AT BEDTIME 90 tablet 0   spironolactone (ALDACTONE) 25 MG tablet TAKE 1/2 TABLET BY MOUTH EVERY DAY AT BEDTIME 15 tablet 0   CINNAMON PO Take 1 capsule by mouth daily. (Patient not taking: Reported on 04/26/2022)     No current facility-administered medications for this visit.     Allergies  Allergen Reactions   Decongestant [Pseudoephedrine Hcl Er]     Unknown reaction    Review of Systems negative except from HPI and PMH  Physical Exam BP 114/78   Pulse 83   Ht 5\' 3"  (1.6 m)   Wt 191 lb 9.6 oz (86.9 kg)   SpO2 99%   BMI 33.94 kg/m  Well developed and well nourished in no acute distress HENT normal Neck supple with JVP-flat Clear Device pocket well healed; without hematoma or erythema.  There is no tethering  Regular rate and rhythm, no   gallop No   murmur Abd-soft with active BS No Clubbing cyanosis   edema Skin-warm and dry A & Oriented  Grossly normal sensory and motor function  ECG sinus with P synchronous pacing at 83 Intervals 14/13/39 Negative QRS lead V1 largely isoelectric QRS in lead I  Device function is  normal.  Programming changes   none  See Paceart for details    CrCl cannot be calculated (Patient's most recent lab result is older than the maximum 21 days allowed.). I was at Assessment and  Plan  Nonischemic cardiomyopathy interval improvement  Anemia  HFpEF  Implantable defibrillator-Medtronic-CRT-high LV pacing threshold  Renal insufficiency grade 3  Orthostatic lightheadedness    With recovered LV function and lightheadness and BP on the low side at home will stop hydralazine and lisinopril, and change her carvedilol to metoprolol for more BB with perhaps less BP lowering affect given the absence of concomitant a blockade.   Continue aldactone  Pt heart failure status is stable. Continue furosemide qod 20 mg.  Electrolytes are stable.

## 2022-08-12 NOTE — Patient Instructions (Addendum)
Medication Instructions:  Your physician has recommended you make the following change in your medication:   ** Stop Carvedilol  ** Stop Hydralazine  ** Stop :Lisinopril  ** Begin Metoprolol '25mg'$  - 1 tablet by mouth daily  ** Continue Spironolactone '25mg'$  - 1/2 tablet IN THE MORNING    *If you need a refill on your cardiac medications before your next appointment, please call your pharmacy*   Lab Work: BMET in 2 weeks If you have labs (blood work) drawn today and your tests are completely normal, you will receive your results only by: [facility] (if you have MyChart) OR A paper copy in the mail If you have any lab test that is abnormal or we need to change your treatment, we will call you to review the results.   Testing/Procedures: None ordered.    Follow-Up: At [facility], you and your health needs are our priority.  As part of our continuing mission to provide you with exceptional heart care, we have created designated Provider Care Teams.  These Care Teams include your primary Cardiologist (physician) and Advanced Practice Providers (APPs -  Physician Assistants and Nurse Practitioners) who all work together to provide you with the care you need, when you need it.  We recommend signing up for the patient portal called "MyChart".  Sign up information is provided on this After Visit Summary.  MyChart is used to connect with patients for Virtual Visits (Telemedicine).  Patients are able to view lab/test results, encounter notes, upcoming appointments, etc.  Non-urgent messages can be sent to your provider as well.   To learn more about what you can do with MyChart, go to [email or web address].    Your next appointment:   12 months with Dr Caryl Comes

## 2022-08-26 ENCOUNTER — Ambulatory Visit: Payer: Medicare Other | Attending: Internal Medicine

## 2022-08-26 DIAGNOSIS — Z95810 Presence of automatic (implantable) cardiac defibrillator: Secondary | ICD-10-CM | POA: Diagnosis not present

## 2022-08-26 DIAGNOSIS — Z79899 Other long term (current) drug therapy: Secondary | ICD-10-CM | POA: Diagnosis not present

## 2022-08-26 DIAGNOSIS — I428 Other cardiomyopathies: Secondary | ICD-10-CM | POA: Diagnosis not present

## 2022-08-26 DIAGNOSIS — I5022 Chronic systolic (congestive) heart failure: Secondary | ICD-10-CM | POA: Diagnosis not present

## 2022-08-27 LAB — BASIC METABOLIC PANEL
BUN/Creatinine Ratio: 9 — ABNORMAL LOW (ref 12–28)
BUN: 8 mg/dL (ref 8–27)
CO2: 25 mmol/L (ref 20–29)
Calcium: 9.8 mg/dL (ref 8.7–10.3)
Chloride: 104 mmol/L (ref 96–106)
Creatinine, Ser: 0.88 mg/dL (ref 0.57–1.00)
Glucose: 84 mg/dL (ref 70–99)
Potassium: 4.2 mmol/L (ref 3.5–5.2)
Sodium: 145 mmol/L — ABNORMAL HIGH (ref 134–144)
eGFR: 71 mL/min/{1.73_m2} (ref >59)

## 2022-08-27 NOTE — Progress Notes (Signed)
Remote ICD transmission.   

## 2022-09-10 ENCOUNTER — Telehealth: Payer: Self-pay

## 2022-09-10 NOTE — Patient Outreach (Signed)
  Care Coordination   09/10/2022 Name: Cynthia Roth MRN: [id number] DOB: 16-Aug-1950   Care Coordination Outreach Attempts:  An unsuccessful telephone outreach was attempted today to offer the patient information about available care coordination services as a benefit of their health plan.   Follow Up Plan:  Additional outreach attempts will be made to offer the patient care coordination information and services.   Encounter Outcome:  No Answer   Care Coordination Interventions:  No, not indicated    Bevelyn Ngo, BSW, CDP Social Worker, Certified Dementia Practitioner [facility] Care Management  Care Coordination [phone]

## 2022-09-13 ENCOUNTER — Inpatient Hospital Stay: Admission: RE | Admit: 2022-09-13 | Payer: Medicare Other | Source: Ambulatory Visit

## 2022-10-28 ENCOUNTER — Ambulatory Visit (INDEPENDENT_AMBULATORY_CARE_PROVIDER_SITE_OTHER): Payer: Medicare Other

## 2022-10-28 DIAGNOSIS — I428 Other cardiomyopathies: Secondary | ICD-10-CM

## 2022-10-28 LAB — CUP PACEART REMOTE DEVICE CHECK
Battery Remaining Longevity: 15 mo
Battery Voltage: 2.91 V
Brady Statistic AP VP Percent: 0.01 %
Brady Statistic AP VS Percent: 0 %
Brady Statistic AS VP Percent: 98.35 %
Brady Statistic AS VS Percent: 1.64 %
Brady Statistic RA Percent Paced: 0.01 %
Brady Statistic RV Percent Paced: 13.8 %
Date Time Interrogation Session: 20240529200711
HighPow Impedance: 99 Ohm
Implantable Lead Connection Status: 753985
Implantable Lead Connection Status: 753985
Implantable Lead Connection Status: 753985
Implantable Lead Implant Date: 20131211
Implantable Lead Implant Date: 20131211
Implantable Lead Implant Date: 20131211
Implantable Lead Location: 753858
Implantable Lead Location: 753859
Implantable Lead Location: 753860
Implantable Lead Model: 181
Implantable Lead Model: 4396
Implantable Lead Model: 5076
Implantable Lead Serial Number: 32342
Implantable Pulse Generator Implant Date: 20200724
Lead Channel Impedance Value: 1045 Ohm
Lead Channel Impedance Value: 361 Ohm
Lead Channel Impedance Value: 513 Ohm
Lead Channel Impedance Value: 532 Ohm
Lead Channel Impedance Value: 532 Ohm
Lead Channel Impedance Value: 570 Ohm
Lead Channel Pacing Threshold Amplitude: 0.625 V
Lead Channel Pacing Threshold Amplitude: 0.75 V
Lead Channel Pacing Threshold Amplitude: 2.375 V
Lead Channel Pacing Threshold Pulse Width: 0.4 ms
Lead Channel Pacing Threshold Pulse Width: 0.4 ms
Lead Channel Pacing Threshold Pulse Width: 1.5 ms
Lead Channel Sensing Intrinsic Amplitude: 10 mV
Lead Channel Sensing Intrinsic Amplitude: 2.8 mV
Lead Channel Setting Pacing Amplitude: 1.5 V
Lead Channel Setting Pacing Amplitude: 2.5 V
Lead Channel Setting Pacing Amplitude: 3 V
Lead Channel Setting Pacing Pulse Width: 0.4 ms
Lead Channel Setting Pacing Pulse Width: 1.5 ms
Lead Channel Setting Sensing Sensitivity: 0.45 mV
Zone Setting Status: 755011
Zone Setting Status: 755011

## 2022-11-01 ENCOUNTER — Ambulatory Visit
Admission: RE | Admit: 2022-11-01 | Discharge: 2022-11-01 | Disposition: A | Discharge status: Home / Self Care | Payer: Medicare Other | Source: Ambulatory Visit | Attending: Family Medicine | Admitting: Family Medicine

## 2022-11-01 DIAGNOSIS — Z1231 Encounter for screening mammogram for malignant neoplasm of breast: Secondary | ICD-10-CM | POA: Diagnosis not present

## 2022-11-16 NOTE — Progress Notes (Signed)
Remote ICD transmission.   

## 2023-01-27 ENCOUNTER — Ambulatory Visit (INDEPENDENT_AMBULATORY_CARE_PROVIDER_SITE_OTHER): Payer: Medicare Other

## 2023-01-27 DIAGNOSIS — I428 Other cardiomyopathies: Secondary | ICD-10-CM

## 2023-01-27 LAB — CUP PACEART REMOTE DEVICE CHECK
Battery Remaining Longevity: 12 mo
Battery Voltage: 2.9 V
Brady Statistic RV Percent Paced: 13.64 %
Date Time Interrogation Session: 20240829011250
HighPow Impedance: 95 Ohm
Implantable Lead Connection Status: 753985
Implantable Lead Connection Status: 753985
Implantable Lead Connection Status: 753985
Implantable Lead Implant Date: 20131211
Implantable Lead Implant Date: 20131211
Implantable Lead Implant Date: 20131211
Implantable Lead Location: 753858
Implantable Lead Location: 753859
Implantable Lead Location: 753860
Implantable Lead Model: 181
Implantable Lead Model: 4396
Implantable Lead Model: 5076
Implantable Lead Serial Number: 32342
Implantable Pulse Generator Implant Date: 20200724
Lead Channel Impedance Value: 342 Ohm
Lead Channel Impedance Value: 532 Ohm
Lead Channel Impedance Value: 532 Ohm
Lead Channel Impedance Value: 608 Ohm
Lead Channel Impedance Value: 627 Ohm
Lead Channel Impedance Value: 950 Ohm
Lead Channel Pacing Threshold Amplitude: 0.625 V
Lead Channel Pacing Threshold Amplitude: 0.625 V
Lead Channel Pacing Threshold Amplitude: 2.375 V
Lead Channel Pacing Threshold Pulse Width: 0.4 ms
Lead Channel Pacing Threshold Pulse Width: 0.4 ms
Lead Channel Pacing Threshold Pulse Width: 1.5 ms
Lead Channel Sensing Intrinsic Amplitude: 10.5 mV
Lead Channel Sensing Intrinsic Amplitude: 2.8 mV
Lead Channel Setting Pacing Amplitude: 1.5 V
Lead Channel Setting Pacing Amplitude: 2.5 V
Lead Channel Setting Pacing Amplitude: 3 V
Lead Channel Setting Pacing Pulse Width: 0.4 ms
Lead Channel Setting Pacing Pulse Width: 1.5 ms
Lead Channel Setting Sensing Sensitivity: 0.45 mV
Zone Setting Status: 755011
Zone Setting Status: 755011

## 2023-02-03 NOTE — Progress Notes (Signed)
Remote ICD transmission.   

## 2023-03-05 ENCOUNTER — Other Ambulatory Visit: Payer: Self-pay | Admitting: Internal Medicine

## 2023-04-27 ENCOUNTER — Encounter: Payer: Medicare Other | Admitting: Family Medicine

## 2023-04-29 ENCOUNTER — Ambulatory Visit (INDEPENDENT_AMBULATORY_CARE_PROVIDER_SITE_OTHER): Payer: Medicare Other

## 2023-04-29 DIAGNOSIS — I428 Other cardiomyopathies: Secondary | ICD-10-CM

## 2023-04-30 LAB — CUP PACEART REMOTE DEVICE CHECK
Battery Remaining Longevity: 9 mo
Battery Voltage: 2.88 V
Brady Statistic RV Percent Paced: 13.67 %
Date Time Interrogation Session: 20241128224033
HighPow Impedance: 88 Ohm
Implantable Lead Connection Status: 753985
Implantable Lead Connection Status: 753985
Implantable Lead Connection Status: 753985
Implantable Lead Implant Date: 20131211
Implantable Lead Implant Date: 20131211
Implantable Lead Implant Date: 20131211
Implantable Lead Location: 753858
Implantable Lead Location: 753859
Implantable Lead Location: 753860
Implantable Lead Model: 181
Implantable Lead Model: 4396
Implantable Lead Model: 5076
Implantable Lead Serial Number: 32342
Implantable Pulse Generator Implant Date: 20200724
Lead Channel Impedance Value: 342 Ohm
Lead Channel Impedance Value: 494 Ohm
Lead Channel Impedance Value: 494 Ohm
Lead Channel Impedance Value: 532 Ohm
Lead Channel Impedance Value: 551 Ohm
Lead Channel Impedance Value: 912 Ohm
Lead Channel Pacing Threshold Amplitude: 0.625 V
Lead Channel Pacing Threshold Amplitude: 0.75 V
Lead Channel Pacing Threshold Amplitude: 2.375 V
Lead Channel Pacing Threshold Pulse Width: 0.4 ms
Lead Channel Pacing Threshold Pulse Width: 0.4 ms
Lead Channel Pacing Threshold Pulse Width: 1.5 ms
Lead Channel Sensing Intrinsic Amplitude: 2.9 mV
Lead Channel Sensing Intrinsic Amplitude: 7.4 mV
Lead Channel Setting Pacing Amplitude: 1.5 V
Lead Channel Setting Pacing Amplitude: 2.5 V
Lead Channel Setting Pacing Amplitude: 3 V
Lead Channel Setting Pacing Pulse Width: 0.4 ms
Lead Channel Setting Pacing Pulse Width: 1.5 ms
Lead Channel Setting Sensing Sensitivity: 0.45 mV
Zone Setting Status: 755011
Zone Setting Status: 755011

## 2023-05-02 ENCOUNTER — Encounter: Payer: Self-pay | Admitting: Family Medicine

## 2023-05-02 ENCOUNTER — Ambulatory Visit (INDEPENDENT_AMBULATORY_CARE_PROVIDER_SITE_OTHER): Payer: Medicare Other | Admitting: Family Medicine

## 2023-05-02 VITALS — BP 120/80 | HR 101 | Temp 99.0°F | Ht 63.0 in | Wt 200.2 lb

## 2023-05-02 DIAGNOSIS — I10 Essential (primary) hypertension: Secondary | ICD-10-CM | POA: Diagnosis not present

## 2023-05-02 DIAGNOSIS — R413 Other amnesia: Secondary | ICD-10-CM | POA: Diagnosis not present

## 2023-05-02 DIAGNOSIS — E782 Mixed hyperlipidemia: Secondary | ICD-10-CM

## 2023-05-02 DIAGNOSIS — K029 Dental caries, unspecified: Secondary | ICD-10-CM

## 2023-05-02 DIAGNOSIS — L83 Acanthosis nigricans: Secondary | ICD-10-CM

## 2023-05-02 DIAGNOSIS — Z0000 Encounter for general adult medical examination without abnormal findings: Secondary | ICD-10-CM

## 2023-05-02 DIAGNOSIS — Z13820 Encounter for screening for osteoporosis: Secondary | ICD-10-CM

## 2023-05-02 DIAGNOSIS — R7303 Prediabetes: Secondary | ICD-10-CM | POA: Diagnosis not present

## 2023-05-02 DIAGNOSIS — F439 Reaction to severe stress, unspecified: Secondary | ICD-10-CM

## 2023-05-02 DIAGNOSIS — R0989 Other specified symptoms and signs involving the circulatory and respiratory systems: Secondary | ICD-10-CM

## 2023-05-02 NOTE — Progress Notes (Signed)
Established Patient Office Visit   Subjective  Patient ID: Cynthia Roth, female    DOB: 08-05-1950  Age: 72 y.o. MRN: [id number]  Chief Complaint  Patient presents with   Annual Exam   Memory Loss    Patient is a 72 year old female seen for CPE and ongoing concerns.  Patient endorses continued stress dealing with family members and friends.  Patient states she feels compelled to care for people however is causing her stress.  Patient's boyfriend currently awaiting trial out of state.  Patient taking care of his adult son.  Also taking care of several family members.  Patient is tired of always being the one to contribute financially.  Patient avoids taking care of herself to care for others.  Has not been to the dentist in years.  Endorses gingival bleeding.  Patient notes change in memory.  Having issues with word finding.    Patient Active Problem List   Diagnosis Date Noted   CAD in native artery 02/22/2021   Obesity (BMI 30.0-34.9) 06/11/2020   Prediabetes 03/01/2020   Biventricular implantable cardioverter-defibrillator in situ    Nonischemic cardiomyopathy (HCC) 03/28/2012   Chronic systolic heart failure (H[facility]) 01/05/2012   HTN (hypertension) 12/02/2011   Dyslipidemia 12/02/2011   LBBB (left bundle branch block) 12/02/2011   Past Medical History:  Diagnosis Date   AICD (automatic cardioverter/defibrillator) present    Allergy    Biventricular ICD -Medtronic    DOI 12/13 - Medtronic Viva XR CRT-D defibrillator, serial #[id number].     CAD in native artery 02/22/2021   CHF (congestive heart failure) (HCC)    Chronic systolic heart failure (H[facility]) 12/02/2011   Colon polyps    Dr Jolee Ewing [facility]   Dyslipidemia 12/02/2011   Gout    "very seldom" (05/10/2012)   Hx of colonic polyps    Hyperlipidemia    Hypertension    "used to have this; now my BP is low" (05/10/2012)   LBBB (left bundle branch block) 12/02/2011   Nonischemic cardiomyopathy ([facility])    Obesity (BMI  30.0-34.9) 06/11/2020   Pre-diabetes    Prediabetes    Presence of permanent cardiac pacemaker    Tubular adenoma 1999   history of   Past Surgical History:  Procedure Laterality Date   BI-VENTRICULAR IMPLANTABLE CARDIOVERTER DEFIBRILLATOR N/A 05/10/2012   Procedure: BI-VENTRICULAR IMPLANTABLE CARDIOVERTER DEFIBRILLATOR  (CRT-D);  Surgeon: Duke Salvia, MD;  Location: [facility] CATH LAB;  Service: Cardiovascular;  Laterality: N/A;   BIV ICD GENERATOR CHANGEOUT N/A 12/22/2018   Procedure: BIV ICD GENERATOR CHANGEOUT;  Surgeon: Duke Salvia, MD;  Location: [facility] INVASIVE CV LAB;  Service: Cardiovascular;  Laterality: N/A;   CARDIAC CATHETERIZATION  12-03-11   selectie coronary angiography   CARDIAC DEFIBRILLATOR PLACEMENT  05/10/2012   CRT-D implantation (05/10/2012)   COLONOSCOPY WITH PROPOFOL N/A 03/17/2017   Procedure: COLONOSCOPY WITH PROPOFOL;  Surgeon: Charna Elizabeth, MD;  Location: [facility] ENDOSCOPY;  Service: Endoscopy;  Laterality: N/A;   LEFT HEART CATHETERIZATION WITH CORONARY ANGIOGRAM N/A 12/03/2011   Procedure: LEFT HEART CATHETERIZATION WITH CORONARY ANGIOGRAM;  Surgeon: Laurey Morale, MD;  Location[facility] CATH LAB;  Service: Cardiovascular;  Laterality: N/A;   PARTIAL HYSTERECTOMY  1980's   Social History   Tobacco Use   Smoking status: Never   Smokeless tobacco: Never  Vaping Use   Vaping status: Never Used  Substance Use Topics   Alcohol use: No    Alcohol/week: 0.0 standard drinks of alcohol   Drug use:  No   Family History  Problem Relation Age of Onset   Angina Mother        diagnosed at age of 49's, unsure whether it was a true diagnosis. unsure about the treatment   Dementia Mother    Sleep apnea Brother    Kidney disease Father    Diabetes Father    Diabetes Brother    Heart failure Maternal Grandmother        diagnosed in age of 5's. still living at age of 28's.   Heart attack Maternal Grandmother    Stroke Maternal Grandmother    Breast cancer Maternal Grandmother     Colon cancer Maternal Grandmother    Colon cancer Other        family history   Intellectual disability Other    Hypertension Neg Hx    Allergies  Allergen Reactions   Decongestant [Pseudoephedrine Hcl Er]     Unknown reaction      ROS Negative unless stated above    Objective:     BP 120/80 (BP Location: Left Arm, Patient Position: Sitting, Cuff Size: Normal)   Pulse (!) 101   Temp 99 F (37.2 C) (Oral)   Ht 5\' 3"  (1.6 m)   Wt 200 lb 3.2 oz (90.8 kg)   SpO2 97%   BMI 35.46 kg/m  BP Readings from Last 3 Encounters:  05/02/23 120/80  08/12/22 114/78  04/26/22 122/84   Wt Readings from Last 3 Encounters:  05/02/23 200 lb 3.2 oz (90.8 kg)  08/12/22 191 lb 9.6 oz (86.9 kg)  04/26/22 188 lb 9.6 oz (85.5 kg)      Physical Exam Constitutional:      Appearance: Normal appearance.  HENT:     Head: Normocephalic and atraumatic.     Right Ear: Tympanic membrane, ear canal and external ear normal.     Left Ear: Tympanic membrane, ear canal and external ear normal.     Nose: Nose normal.     Mouth/Throat:     Mouth: Mucous membranes are moist.     Dentition: Abnormal dentition. Dental caries present.     Pharynx: No oropharyngeal exudate or posterior oropharyngeal erythema.  Eyes:     General: No scleral icterus.    Extraocular Movements: Extraocular movements intact.     Conjunctiva/sclera: Conjunctivae normal.     Pupils: Pupils are equal, round, and reactive to light.  Neck:     Thyroid: No thyromegaly.     Vascular: Carotid bruit present.     Comments: Left carotid bruit Cardiovascular:     Rate and Rhythm: Normal rate and regular rhythm.     Pulses: Normal pulses.     Heart sounds: Normal heart sounds. No murmur heard.    No friction rub.  Pulmonary:     Effort: Pulmonary effort is normal.     Breath sounds: Normal breath sounds. No wheezing, rhonchi or rales.  Abdominal:     General: Bowel sounds are normal.     Palpations: Abdomen is soft.      Tenderness: There is no abdominal tenderness.  Musculoskeletal:        General: No deformity. Normal range of motion.  Lymphadenopathy:     Cervical: No cervical adenopathy.  Skin:    General: Skin is warm and dry.     Findings: No lesion.     Comments: Acanthosis nigricans of anterior and posterior neck.  Neurological:     General: No focal deficit present.  Mental Status: She is alert and oriented to person, place, and time.  Psychiatric:        Mood and Affect: Mood normal.        Thought Content: Thought content normal.       05/02/2023    2:54 PM 04/26/2022   11:01 AM 04/16/2021    8:31 AM  Depression screen PHQ 2/9  Decreased Interest 0  0  Down, Depressed, Hopeless 0 1 0  PHQ - 2 Score 0 1 0  Altered sleeping 0 0   Tired, decreased energy 0 3   Change in appetite 0 1   Feeling bad or failure about yourself  0 1   Trouble concentrating 0 0   Moving slowly or fidgety/restless 0 0   Suicidal thoughts 0 0   PHQ-9 Score 0 6       05/02/2023    2:54 PM 09/03/2019    4:47 PM  GAD 7 : Generalized Anxiety Score  Nervous, Anxious, on Edge 0 0  Control/stop worrying 0 0  Worry too much - different things 0 2  Trouble relaxing 0 0  Restless 0 0  Easily annoyed or irritable  0  Afraid - awful might happen 0 1  Total GAD 7 Score  3      No results found for any visits on 05/02/23.    Assessment & Plan:  Routine adult health maintenance -Age-appropriate health screenings discussed -Obtain labs -Last Pap 02/28/2020 -Mammogram done 11/28/2022 -Colonoscopy done 03/17/2017 -Immunizations reviewed.  COVID and flu vaccines done November 2024.  Declines shingles and pneumonia vaccines this visit Next CPE in 1 year  Essential hypertension -Controlled -Continue Toprol XL 25 mg daily, spironolactone 12.5 mg. -Also taking Lasix 20 mg every other day -     TSH -     T4, free -     Comprehensive metabolic panel  Mixed hyperlipidemia -Previously on Lipitor 20 mg  daily.   -Appears needs follow-up with cardiology -     Comprehensive metabolic panel -     Lipid panel  Prediabetes -Hemoglobin A1c 6.4% on 04/26/2022 -Concern A1c has increased given acanthosis nigricans.  Start medication if needed for A1c greater than 6.5%. -     Hemoglobin A1c  Memory change -Discussed possible symptoms including electrolyte abnormality, thyroid dysfunction, dementia -Will obtain labs -Will have patient follow-up in the next few weeks for dedicated memory testing.  Based on results may need imaging and referral for neuropsych testing. -     TSH -     T4, free -     Comprehensive metabolic panel -     CBC with Differential/Platelet -     Vitamin B12 -     Hemoglobin A1c -     VITAMIN D 25 Hydroxy (Vit-D Deficiency, Fractures) -     Folate  Bruit of left carotid artery -CT soft tissue neck with contrast 11/14/2012 with focal atherosclerotic calcification at the bifurcation of the left carotid artery without evidence of hemodynamically significant stenosis. -Consider carotid ultrasound.  Discussed with patient in further detail at next OFV. -     Lipid panel  Acanthosis nigricans  Dental caries -     Hemoglobin A1c  Stress -Patient strongly encouraged to set boundaries -Self-care -Consider counseling  Screening for osteoporosis         -     Bone density scan   Return in about 5 weeks (around 06/06/2023) for memory testing.   Deeann Saint,  MD

## 2023-06-06 ENCOUNTER — Ambulatory Visit: Payer: Medicare PPO | Admitting: Family Medicine

## 2023-06-06 ENCOUNTER — Encounter: Payer: Self-pay | Admitting: Family Medicine

## 2023-06-06 VITALS — BP 132/84 | HR 90 | Temp 98.3°F | Ht 63.0 in | Wt 196.2 lb

## 2023-06-06 DIAGNOSIS — F4321 Adjustment disorder with depressed mood: Secondary | ICD-10-CM

## 2023-06-06 DIAGNOSIS — R1310 Dysphagia, unspecified: Secondary | ICD-10-CM | POA: Insufficient documentation

## 2023-06-06 DIAGNOSIS — L83 Acanthosis nigricans: Secondary | ICD-10-CM | POA: Diagnosis not present

## 2023-06-06 DIAGNOSIS — R413 Other amnesia: Secondary | ICD-10-CM

## 2023-06-06 DIAGNOSIS — Z860100 Personal history of colon polyps, unspecified: Secondary | ICD-10-CM | POA: Insufficient documentation

## 2023-06-06 DIAGNOSIS — K219 Gastro-esophageal reflux disease without esophagitis: Secondary | ICD-10-CM | POA: Insufficient documentation

## 2023-06-06 DIAGNOSIS — K5730 Diverticulosis of large intestine without perforation or abscess without bleeding: Secondary | ICD-10-CM | POA: Insufficient documentation

## 2023-06-06 LAB — POCT GLYCOSYLATED HEMOGLOBIN (HGB A1C): Hemoglobin A1C: 5.8 % — AB (ref 4.0–5.6)

## 2023-06-06 NOTE — Patient Instructions (Addendum)
 While grief can also affect your memory, a referral for neuropsych testing and an order for CT scan were placed to further evaluate your changes in memory.

## 2023-06-06 NOTE — Progress Notes (Signed)
 Established Patient Office Visit   Subjective  Patient ID: Cynthia Roth, female    DOB: 1950-07-26  Age: 73 y.o. MRN: [id number]  Chief Complaint  Patient presents with   Memory Loss  Patient presents to visit alone.  Patient is a 73 year old female seen for follow-up on memory.  Patient with continued word finding and memory issues.  Endorses increased stress with family as well as grief due to loss of a family member in the last week.  Patient's son has also been in the hospital due to a kidney issue.  Patient states that time she cannot recall what he was in the hospital for when people ask.  Patient states she was in faith-based counseling years ago but has not been back since.  Patient states she is used to being the one to care for everyone else/deal with her problems.  Pt notes darkening of skin at base of neck.    Patient Active Problem List   Diagnosis Date Noted   Dysphagia 06/06/2023   Gastroesophageal reflux disease 06/06/2023   History of colonic polyps 06/06/2023   Morbid (severe) obesity due to excess calories (HCC) 06/06/2023   Diverticulosis of large intestine without perforation or abscess without bleeding 06/06/2023   CAD in native artery 02/22/2021   Obesity (BMI 30.0-34.9) 06/11/2020   Prediabetes 03/01/2020   Biventricular implantable cardioverter-defibrillator in situ    Nonischemic cardiomyopathy (HCC) 03/28/2012   Chronic systolic heart failure (HCC) 01/05/2012   HTN (hypertension) 12/02/2011   Dyslipidemia 12/02/2011   LBBB (left bundle branch block) 12/02/2011   Past Medical History:  Diagnosis Date   AICD (automatic cardioverter/defibrillator) present    Allergy    Biventricular ICD -Medtronic    DOI 12/13 - Medtronic Viva XR CRT-D defibrillator, serial #[id number] H.     CAD in native artery 02/22/2021   CHF (congestive heart failure) (HCC)    Chronic systolic heart failure (HCC) 12/02/2011   Colon polyps    Dr Renaye Mann-GI    Dyslipidemia 12/02/2011   Gout    very seldom (05/10/2012)   Hx of colonic polyps    Hyperlipidemia    Hypertension    used to have this; now my BP is low (05/10/2012)   LBBB (left bundle branch block) 12/02/2011   Nonischemic cardiomyopathy (HCC)    Obesity (BMI 30.0-34.9) 06/11/2020   Pre-diabetes    Prediabetes    Presence of permanent cardiac pacemaker    Tubular adenoma 1999   history of   Past Surgical History:  Procedure Laterality Date   BI-VENTRICULAR IMPLANTABLE CARDIOVERTER DEFIBRILLATOR N/A 05/10/2012   Procedure: BI-VENTRICULAR IMPLANTABLE CARDIOVERTER DEFIBRILLATOR  (CRT-D);  Surgeon: Elspeth JAYSON Sage, MD;  Location: [facility] CATH LAB;  Service: Cardiovascular;  Laterality: N/A;   BIV ICD GENERATOR CHANGEOUT N/A 12/22/2018   Procedure: BIV ICD GENERATOR CHANGEOUT;  Surgeon: Sage Elspeth JAYSON, MD;  Location: [facility] INVASIVE CV LAB;  Service: Cardiovascular;  Laterality: N/A;   CARDIAC CATHETERIZATION  12-03-11   selectie coronary angiography   CARDIAC DEFIBRILLATOR PLACEMENT  05/10/2012   CRT-D implantation (05/10/2012)   COLONOSCOPY WITH PROPOFOL  N/A 03/17/2017   Procedure: COLONOSCOPY WITH PROPOFOL ;  Surgeon: Kristie Renaye, MD;  Location: [facility] ENDOSCOPY;  Service: Endoscopy;  Laterality: N/A;   LEFT HEART CATHETERIZATION WITH CORONARY ANGIOGRAM N/A 12/03/2011   Procedure: LEFT HEART CATHETERIZATION WITH CORONARY ANGIOGRAM;  Surgeon: Ezra GORMAN Shuck, MD;  Location: [facility] CATH LAB;  Service: Cardiovascular;  Laterality: N/A;   PARTIAL HYSTERECTOMY  1980's  Social History   Tobacco Use   Smoking status: Never   Smokeless tobacco: Never  Vaping Use   Vaping status: Never Used  Substance Use Topics   Alcohol use: No    Alcohol/week: 0.0 standard drinks of alcohol   Drug use: No   Family History  Problem Relation Age of Onset   Angina Mother        diagnosed at age of 89's, unsure whether it was a true diagnosis. unsure about the treatment   Dementia Mother    Sleep apnea Brother     Kidney disease Father    Diabetes Father    Diabetes Brother    Heart failure Maternal Grandmother        diagnosed in age of 40's. still living at age of 33's.   Heart attack Maternal Grandmother    Stroke Maternal Grandmother    Breast cancer Maternal Grandmother    Colon cancer Maternal Grandmother    Colon cancer Other        family history   Intellectual disability Other    Hypertension Neg Hx    Allergies  Allergen Reactions   Decongestant [Pseudoephedrine Hcl Er]     Unknown reaction   Pseudoephedrine Other (See Comments)      ROS Negative unless stated above    Objective:     BP 132/84 (BP Location: Left Arm, Patient Position: Sitting, Cuff Size: Normal)   Pulse 90   Temp 98.3 F (36.8 C) (Oral)   Ht 5' 3 (1.6 m)   Wt 196 lb 3.2 oz (89 kg)   SpO2 97%   BMI 34.76 kg/m  BP Readings from Last 3 Encounters:  06/06/23 132/84  05/02/23 120/80  08/12/22 114/78   Wt Readings from Last 3 Encounters:  06/06/23 196 lb 3.2 oz (89 kg)  05/02/23 200 lb 3.2 oz (90.8 kg)  08/12/22 191 lb 9.6 oz (86.9 kg)      Physical Exam Constitutional:      General: She is not in acute distress.    Appearance: Normal appearance.  HENT:     Head: Normocephalic and atraumatic.     Nose: Nose normal.     Mouth/Throat:     Mouth: Mucous membranes are moist.  Cardiovascular:     Rate and Rhythm: Normal rate and regular rhythm.     Heart sounds: Normal heart sounds. No murmur heard.    No gallop.  Pulmonary:     Effort: Pulmonary effort is normal. No respiratory distress.     Breath sounds: Normal breath sounds. No wheezing, rhonchi or rales.  Skin:    General: Skin is warm and dry.     Comments: Hyperpigmentation at base of neck.  Neurological:     Mental Status: She is alert and oriented to person, place, and time.     No results found for any visits on 06/06/23.    Assessment & Plan:  Memory change -MoCA administered this visit.  Score 15/30.  No extra point  given for education.  Concern score slightly lower than actual ability due to decreased effort. -No decreased effort may be a factor must also consider other causes such as vascular disease.  Labs from 05/02/2023 reviewed. -Will proceed with referral for neuropsych testing and CT head to further evaluate. -Family involvement encouraged. -     Ambulatory referral to Psychology -     CT HEAD WO CONTRAST ( ); Future  Acanthosis nigricans -Hemoglobin A1c 5.8% this visit -  POCT glycosylated hemoglobin (Hb A1C)  Grief -Patient advised to set up counseling appointment. -Also discussed the importance of setting boundaries.   Return in about 12 weeks (around 08/29/2023).   Clotilda JONELLE Single, MD

## 2023-06-16 ENCOUNTER — Telehealth: Payer: Self-pay | Admitting: *Deleted

## 2023-06-16 NOTE — Telephone Encounter (Signed)
   Pre-operative Risk Assessment    Patient Name: Cynthia Roth  DOB: 08/12/1950 MRN: [id number]   Date of last office visit: 08/12/2022 Date of next office visit: None   Request for Surgical Clearance    Procedure:   Colonoscopy  Date of Surgery:  Clearance 08/12/23                                 Surgeon:  Dr. Jeani Hawking Surgeon's Group or Practice Name:  [facility] Phone number:  [phone] Fax number:  [phone]   Type of Clearance Requested:   - Medical  - Pharmacy:  Hold Aspirin Not Indicated   Type of Anesthesia:   Propofol   Additional requests/questions:    Signed, Emmit Pomfret   06/16/2023, 11:30 AM

## 2023-06-17 ENCOUNTER — Other Ambulatory Visit: Payer: Self-pay | Admitting: Gastroenterology

## 2023-06-17 NOTE — Telephone Encounter (Signed)
   Name: Cynthia Roth  DOB: 10/27/1950  MRN: [id number]  Primary Cardiologist: Chilton Si, MD   Preoperative team, please contact this patient and set up a phone call appointment for further preoperative risk assessment. Please obtain consent and complete medication review. Thank you for your help.  I confirm that guidance regarding antiplatelet and oral anticoagulation therapy has been completed and, if necessary, noted below.  Patient is on ASA 81 mg however not requested to be held.  I also confirmed the patient resides in the state of [place]. As per [facility] Medical Board telemedicine laws, the patient must reside in the state in which the provider is licensed.   Napoleon Form, Leodis Rains, NP 06/17/2023, 6:38 AM [facility]

## 2023-06-17 NOTE — Telephone Encounter (Signed)
 1st attempt to reach pt regarding surgical clearance and the need for an TELE appointment.  Left pt a detailed message to call back and get that scheduled.

## 2023-06-20 NOTE — Telephone Encounter (Signed)
Called patient to try to set up an Tele appt, but patient mention if she can get a call back tomorrow due that she is driving and can not do 2 things at once.

## 2023-06-21 NOTE — Telephone Encounter (Signed)
3rd attempt to reach pt to schedule tele preop appt. I will update the requesting office.

## 2023-08-01 LAB — CUP PACEART REMOTE DEVICE CHECK
Battery Remaining Longevity: 6 mo
Battery Voltage: 2.86 V
Brady Statistic RV Percent Paced: 16.92 %
Date Time Interrogation Session: 20250303023619
HighPow Impedance: 95 Ohm
Implantable Lead Connection Status: 753985
Implantable Lead Connection Status: 753985
Implantable Lead Connection Status: 753985
Implantable Lead Implant Date: 20131211
Implantable Lead Implant Date: 20131211
Implantable Lead Implant Date: 20131211
Implantable Lead Location: 753858
Implantable Lead Location: 753859
Implantable Lead Location: 753860
Implantable Lead Model: 181
Implantable Lead Model: 4396
Implantable Lead Model: 5076
Implantable Lead Serial Number: 32342
Implantable Pulse Generator Implant Date: 20200724
Lead Channel Impedance Value: 342 Ohm
Lead Channel Impedance Value: 475 Ohm
Lead Channel Impedance Value: 513 Ohm
Lead Channel Impedance Value: 608 Ohm
Lead Channel Impedance Value: 627 Ohm
Lead Channel Impedance Value: 893 Ohm
Lead Channel Pacing Threshold Amplitude: 0.625 V
Lead Channel Pacing Threshold Amplitude: 0.75 V
Lead Channel Pacing Threshold Amplitude: 2.375 V
Lead Channel Pacing Threshold Pulse Width: 0.4 ms
Lead Channel Pacing Threshold Pulse Width: 0.4 ms
Lead Channel Pacing Threshold Pulse Width: 1.5 ms
Lead Channel Sensing Intrinsic Amplitude: 3.5 mV
Lead Channel Sensing Intrinsic Amplitude: 8.6 mV
Lead Channel Setting Pacing Amplitude: 1.5 V
Lead Channel Setting Pacing Amplitude: 2.5 V
Lead Channel Setting Pacing Amplitude: 3 V
Lead Channel Setting Pacing Pulse Width: 0.4 ms
Lead Channel Setting Pacing Pulse Width: 1.5 ms
Lead Channel Setting Sensing Sensitivity: 0.45 mV
Zone Setting Status: 755011
Zone Setting Status: 755011

## 2023-08-29 ENCOUNTER — Encounter: Payer: Self-pay | Admitting: Family Medicine

## 2023-08-29 ENCOUNTER — Ambulatory Visit: Payer: Medicare PPO | Admitting: Family Medicine

## 2023-08-29 ENCOUNTER — Telehealth: Payer: Self-pay

## 2023-08-29 VITALS — BP 118/72 | HR 93 | Temp 98.5°F | Ht 63.0 in | Wt 192.4 lb

## 2023-08-29 DIAGNOSIS — R413 Other amnesia: Secondary | ICD-10-CM | POA: Diagnosis not present

## 2023-08-29 DIAGNOSIS — I10 Essential (primary) hypertension: Secondary | ICD-10-CM | POA: Diagnosis not present

## 2023-08-29 NOTE — Progress Notes (Signed)
 Established Patient Office Visit   Subjective  Patient ID: Cynthia Roth, female    DOB: 08-May-1951  Age: 73 y.o. MRN: [id number]  Chief Complaint  Patient presents with   Follow-up    3 month follow-up for memory changes, fatigue     Pt is a 73 yo female seen for f/u.  Pt states she did not hear anything back about memory testing or CT head.  Patient admits she does not answer most phone calls and does not remember how to access her voicemail.  Pt still dealing with family and others relying on her for assistance.  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  At times gives the man money as he is not working.  Pt feels bad because the man's father is incarcerated in [place] and he does not have any family in the area.  At one point pt was friends/dating the man who was incarcerated.  Patient states he called several times per day and at times asked for money.  Pt helping her brother, his gf, and the gf's sister.  Pt also taking care of her mother and grandmother's estates, but not having much help from family.  Pt endorses increased stress, not wanting to get out of bed at times.  Has thought about faith-based counseling but has not scheduled anything.  Patient mentions not taking her medications consistently because she does not feel like it.  Patient has old pill bottles with her from Cardiology this visit.  It appears she is likely out.    Patient Active Problem List   Diagnosis Date Noted   Dysphagia 06/06/2023   Gastroesophageal reflux disease 06/06/2023   History of colonic polyps 06/06/2023   Morbid (severe) obesity due to excess calories (HCC) 06/06/2023   Diverticulosis of large intestine without perforation or abscess without bleeding 06/06/2023   CAD in native artery 02/22/2021   Obesity (BMI 30.0-34.9) 06/11/2020   Prediabetes 03/01/2020   Biventricular implantable cardioverter-defibrillator in situ    Nonischemic  cardiomyopathy (HCC) 03/28/2012   Chronic systolic heart failure (HCC) 01/05/2012   HTN (hypertension) 12/02/2011   Dyslipidemia 12/02/2011   LBBB (left bundle branch block) 12/02/2011   Past Medical History:  Diagnosis Date   AICD (automatic cardioverter/defibrillator) present    Allergy    Biventricular ICD -Medtronic    DOI 12/13 - Medtronic Viva XR CRT-D defibrillator, serial #[id number] H.     CAD in native artery 02/22/2021   CHF (congestive heart failure) (HCC)    Chronic systolic heart failure (HCC) 12/02/2011   Colon polyps    Dr Jolee Ewing Mann-GI   Dyslipidemia 12/02/2011   Gout    "very seldom" (05/10/2012)   Hx of colonic polyps    Hyperlipidemia    Hypertension    "used to have this; now my BP is low" (05/10/2012)   LBBB (left bundle branch block) 12/02/2011   Nonischemic cardiomyopathy (HCC)    Obesity (BMI 30.0-34.9) 06/11/2020   Pre-diabetes    Prediabetes    Presence of permanent cardiac pacemaker    Tubular adenoma 1999   history of   Past Surgical History:  Procedure Laterality Date   BI-VENTRICULAR IMPLANTABLE CARDIOVERTER DEFIBRILLATOR N/A 05/10/2012   Procedure: BI-VENTRICULAR IMPLANTABLE CARDIOVERTER DEFIBRILLATOR  (CRT-D);  Surgeon: Duke Salvia, MD;  Location: [facility] CATH LAB;  Service: Cardiovascular;  Laterality: N/A;   BIV ICD GENERATOR CHANGEOUT N/A 12/22/2018   Procedure: BIV ICD GENERATOR CHANGEOUT;  Surgeon:  Duke Salvia, MD;  Location: [facility] INVASIVE CV LAB;  Service: Cardiovascular;  Laterality: N/A;   CARDIAC CATHETERIZATION  12-03-11   selectie coronary angiography   CARDIAC DEFIBRILLATOR PLACEMENT  05/10/2012   CRT-D implantation (05/10/2012)   COLONOSCOPY WITH PROPOFOL N/A 03/17/2017   Procedure: COLONOSCOPY WITH PROPOFOL;  Surgeon: Charna Elizabeth, MD;  Location: [facility] ENDOSCOPY;  Service: Endoscopy;  Laterality: N/A;   LEFT HEART CATHETERIZATION WITH CORONARY ANGIOGRAM N/A 12/03/2011   Procedure: LEFT HEART CATHETERIZATION WITH CORONARY ANGIOGRAM;   Surgeon: Laurey Morale, MD;  Location: [facility] CATH LAB;  Service: Cardiovascular;  Laterality: N/A;   PARTIAL HYSTERECTOMY  1980's   Social History   Tobacco Use   Smoking status: Never   Smokeless tobacco: Never  Vaping Use   Vaping status: Never Used  Substance Use Topics   Alcohol use: No    Alcohol/week: 0.0 standard drinks of alcohol   Drug use: No   Family History  Problem Relation Age of Onset   Angina Mother        diagnosed at age of 70's, unsure whether it was a true diagnosis. unsure about the treatment   Dementia Mother    Sleep apnea Brother    Kidney disease Father    Diabetes Father    Diabetes Brother    Heart failure Maternal Grandmother        diagnosed in age of 74's. still living at age of 62's.   Heart attack Maternal Grandmother    Stroke Maternal Grandmother    Breast cancer Maternal Grandmother    Colon cancer Maternal Grandmother    Colon cancer Other        family history   Intellectual disability Other    Hypertension Neg Hx    Allergies  Allergen Reactions   Decongestant [Pseudoephedrine Hcl Er]     Unknown reaction   Pseudoephedrine Other (See Comments)      ROS Negative unless stated above    Objective:     BP 118/72 (BP Location: Left Arm, Patient Position: Sitting, Cuff Size: Normal)   Pulse 93   Temp 98.5 F (36.9 C) (Oral)   Ht 5\' 3"  (1.6 m)   Wt 192 lb 6.4 oz (87.3 kg)   SpO2 97%   BMI 34.08 kg/m  BP Readings from Last 3 Encounters:  08/29/23 118/72  06/06/23 132/84  05/02/23 120/80   Wt Readings from Last 3 Encounters:  08/29/23 192 lb 6.4 oz (87.3 kg)  06/06/23 196 lb 3.2 oz (89 kg)  05/02/23 200 lb 3.2 oz (90.8 kg)      Physical Exam Constitutional:      General: She is not in acute distress.    Appearance: Normal appearance.  HENT:     Head: Normocephalic and atraumatic.     Nose: Nose normal.     Mouth/Throat:     Mouth: Mucous membranes are moist.  Eyes:     Extraocular Movements: Extraocular  movements intact.     Conjunctiva/sclera: Conjunctivae normal.     Pupils: Pupils are equal, round, and reactive to light.  Cardiovascular:     Rate and Rhythm: Normal rate.  Pulmonary:     Effort: Pulmonary effort is normal. No respiratory distress.     Breath sounds: Normal breath sounds. No wheezing, rhonchi or rales.  Skin:    General: Skin is warm and dry.  Neurological:     Mental Status: She is alert and oriented to person, place, and time.  No results found for any visits on 08/29/23.    Assessment & Plan:  Memory change  Essential hypertension  Pt with continued stress and changes in memory.  Likely multifactorial.  Pt given contact info for prevously placed neuropsych testing referral to call and set up appointment.  CT head scheduled while patient clinic however patient has a conflict with date.  Will call to have imaging rescheduled.  Given continued concerns regarding patient's need to help others discussed family involvement.  Patient to add her son to [facility].  Discussed plan send to next appointment.  Will await testing results for further recommendations.  BP stable.  Continue current medications.  Concern patient may need refills, advised to contact Cardiology.  Return in about 2 months (around 10/29/2023).   Deeann Saint, MD

## 2023-08-29 NOTE — Telephone Encounter (Signed)
 Called and left patient a message with appt for CT [address] on thur April 17th at 10:45am

## 2023-09-05 ENCOUNTER — Ambulatory Visit (INDEPENDENT_AMBULATORY_CARE_PROVIDER_SITE_OTHER)

## 2023-09-05 DIAGNOSIS — I5022 Chronic systolic (congestive) heart failure: Secondary | ICD-10-CM

## 2023-09-05 DIAGNOSIS — I428 Other cardiomyopathies: Secondary | ICD-10-CM

## 2023-09-06 LAB — CUP PACEART REMOTE DEVICE CHECK
Battery Remaining Longevity: 4 mo
Battery Voltage: 2.85 V
Brady Statistic RV Percent Paced: 15.12 %
Date Time Interrogation Session: 20250407031807
HighPow Impedance: 96 Ohm
Implantable Lead Connection Status: 753985
Implantable Lead Connection Status: 753985
Implantable Lead Connection Status: 753985
Implantable Lead Implant Date: 20131211
Implantable Lead Implant Date: 20131211
Implantable Lead Implant Date: 20131211
Implantable Lead Location: 753858
Implantable Lead Location: 753859
Implantable Lead Location: 753860
Implantable Lead Model: 181
Implantable Lead Model: 4396
Implantable Lead Model: 5076
Implantable Lead Serial Number: 32342
Implantable Pulse Generator Implant Date: 20200724
Lead Channel Impedance Value: 323 Ohm
Lead Channel Impedance Value: 494 Ohm
Lead Channel Impedance Value: 532 Ohm
Lead Channel Impedance Value: 589 Ohm
Lead Channel Impedance Value: 589 Ohm
Lead Channel Impedance Value: 912 Ohm
Lead Channel Pacing Threshold Amplitude: 0.625 V
Lead Channel Pacing Threshold Amplitude: 0.75 V
Lead Channel Pacing Threshold Amplitude: 2.375 V
Lead Channel Pacing Threshold Pulse Width: 0.4 ms
Lead Channel Pacing Threshold Pulse Width: 0.4 ms
Lead Channel Pacing Threshold Pulse Width: 1.5 ms
Lead Channel Sensing Intrinsic Amplitude: 2.8 mV
Lead Channel Sensing Intrinsic Amplitude: 9.3 mV
Lead Channel Setting Pacing Amplitude: 1.5 V
Lead Channel Setting Pacing Amplitude: 2.5 V
Lead Channel Setting Pacing Amplitude: 3 V
Lead Channel Setting Pacing Pulse Width: 0.4 ms
Lead Channel Setting Pacing Pulse Width: 1.5 ms
Lead Channel Setting Sensing Sensitivity: 0.45 mV
Zone Setting Status: 755011
Zone Setting Status: 755011

## 2023-09-08 ENCOUNTER — Other Ambulatory Visit

## 2023-09-08 ENCOUNTER — Telehealth: Payer: Self-pay

## 2023-09-08 NOTE — Telephone Encounter (Signed)
 Copied from CRM [id number]. Topic: General - Other >> Sep 08, 2023  9:31 AM Aletta Edouard wrote: Reason for CRM: patient would like a call back regarding her appt on the 17th

## 2023-09-08 NOTE — Telephone Encounter (Signed)
Called patient, patient is aware

## 2023-09-12 ENCOUNTER — Encounter: Payer: Self-pay | Admitting: Internal Medicine

## 2023-09-13 DIAGNOSIS — I10 Essential (primary) hypertension: Secondary | ICD-10-CM | POA: Diagnosis not present

## 2023-09-13 DIAGNOSIS — Z8249 Family history of ischemic heart disease and other diseases of the circulatory system: Secondary | ICD-10-CM | POA: Diagnosis not present

## 2023-09-13 DIAGNOSIS — G3184 Mild cognitive impairment, so stated: Secondary | ICD-10-CM | POA: Diagnosis not present

## 2023-09-13 DIAGNOSIS — F4320 Adjustment disorder, unspecified: Secondary | ICD-10-CM | POA: Diagnosis not present

## 2023-09-13 DIAGNOSIS — E669 Obesity, unspecified: Secondary | ICD-10-CM | POA: Diagnosis not present

## 2023-09-13 DIAGNOSIS — J309 Allergic rhinitis, unspecified: Secondary | ICD-10-CM | POA: Diagnosis not present

## 2023-09-13 DIAGNOSIS — E785 Hyperlipidemia, unspecified: Secondary | ICD-10-CM | POA: Diagnosis not present

## 2023-09-13 DIAGNOSIS — R609 Edema, unspecified: Secondary | ICD-10-CM | POA: Diagnosis not present

## 2023-09-13 DIAGNOSIS — Z7982 Long term (current) use of aspirin: Secondary | ICD-10-CM | POA: Diagnosis not present

## 2023-09-14 ENCOUNTER — Telehealth: Payer: Self-pay

## 2023-09-14 NOTE — Telephone Encounter (Signed)
 Copied from CRM [id number]. Topic: Clinical - Medical Advice >> Sep 14, 2023 11:07 AM Elita Guitar wrote: Reason for CRM: Tanya Fantasia from [facility] imaging called and stated that they have not received the authorization that is needed for the patient's appt with them tomorrow. She stated that they will have to cancel the appt until it is faxed to them. Please call and advise at [phone]

## 2023-09-15 ENCOUNTER — Other Ambulatory Visit

## 2023-09-17 ENCOUNTER — Encounter: Payer: Self-pay | Admitting: Internal Medicine

## 2023-09-23 ENCOUNTER — Encounter (HOSPITAL_COMMUNITY): Payer: Self-pay | Admitting: Gastroenterology

## 2023-09-30 ENCOUNTER — Ambulatory Visit (HOSPITAL_COMMUNITY): Admission: RE | Admit: 2023-09-30 | Payer: Medicare PPO | Source: Home / Self Care | Admitting: Gastroenterology

## 2023-09-30 SURGERY — COLONOSCOPY WITH PROPOFOL
Anesthesia: Monitor Anesthesia Care

## 2023-10-06 ENCOUNTER — Ambulatory Visit (INDEPENDENT_AMBULATORY_CARE_PROVIDER_SITE_OTHER)

## 2023-10-06 DIAGNOSIS — I428 Other cardiomyopathies: Secondary | ICD-10-CM

## 2023-10-06 LAB — CUP PACEART REMOTE DEVICE CHECK
Battery Remaining Longevity: 4 mo
Battery Voltage: 2.84 V
Brady Statistic RV Percent Paced: 17.19 %
Date Time Interrogation Session: 20250507205435
HighPow Impedance: 96 Ohm
Implantable Lead Connection Status: 753985
Implantable Lead Connection Status: 753985
Implantable Lead Connection Status: 753985
Implantable Lead Implant Date: 20131211
Implantable Lead Implant Date: 20131211
Implantable Lead Implant Date: 20131211
Implantable Lead Location: 753858
Implantable Lead Location: 753859
Implantable Lead Location: 753860
Implantable Lead Model: 181
Implantable Lead Model: 4396
Implantable Lead Model: 5076
Implantable Lead Serial Number: 32342
Implantable Pulse Generator Implant Date: 20200724
Lead Channel Impedance Value: 323 Ohm
Lead Channel Impedance Value: 513 Ohm
Lead Channel Impedance Value: 513 Ohm
Lead Channel Impedance Value: 532 Ohm
Lead Channel Impedance Value: 532 Ohm
Lead Channel Impedance Value: 931 Ohm
Lead Channel Pacing Threshold Amplitude: 0.625 V
Lead Channel Pacing Threshold Amplitude: 0.75 V
Lead Channel Pacing Threshold Amplitude: 2.375 V
Lead Channel Pacing Threshold Pulse Width: 0.4 ms
Lead Channel Pacing Threshold Pulse Width: 0.4 ms
Lead Channel Pacing Threshold Pulse Width: 1.5 ms
Lead Channel Sensing Intrinsic Amplitude: 10.1 mV
Lead Channel Sensing Intrinsic Amplitude: 2.8 mV
Lead Channel Setting Pacing Amplitude: 1.5 V
Lead Channel Setting Pacing Amplitude: 2.5 V
Lead Channel Setting Pacing Amplitude: 3 V
Lead Channel Setting Pacing Pulse Width: 0.4 ms
Lead Channel Setting Pacing Pulse Width: 1.5 ms
Lead Channel Setting Sensing Sensitivity: 0.45 mV
Zone Setting Status: 755011
Zone Setting Status: 755011

## 2023-10-26 ENCOUNTER — Ambulatory Visit: Payer: Self-pay | Admitting: Cardiology

## 2023-10-26 NOTE — Progress Notes (Signed)
 Remote ICD transmission.

## 2023-10-26 NOTE — Addendum Note (Signed)
 Addended by: Edra Govern D on: 10/26/2023 03:26 PM   Modules accepted: Orders, Level of Service

## 2023-11-02 ENCOUNTER — Other Ambulatory Visit: Payer: Self-pay | Admitting: Internal Medicine

## 2023-11-02 ENCOUNTER — Other Ambulatory Visit: Payer: Self-pay | Admitting: Family Medicine

## 2023-11-02 DIAGNOSIS — J302 Other seasonal allergic rhinitis: Secondary | ICD-10-CM

## 2023-11-06 NOTE — Progress Notes (Unsigned)
 Cardiology Office Note    Date:  11/06/2023  ID:  Cynthia Roth, Cynthia Roth 1950/06/06, MRN [id number] PCP:  Viola Greulich, MD  Cardiologist:  Maudine Sos, MD  Electrophysiologist:  Richardo Chandler, MD   Chief Complaint: ***  History of Present Illness: .    Cynthia Roth is a 72 y.o. female with visit-pertinent history of chronic systolic and diastolic heart failure resolved after CRT-D, CAD, hypertension and left bundle branch block.  Patient was previously followed by Dr. Mitzie Anda.  She was admitted to [facility] in 11/2011 with acute systolic and diastolic heart failure.  Her LV was severely dilated and LVEF was 15%, also had a left bundle branch block.  LHC at that time showed mild, nonobstructive CAD.  Repeat echo 3 months later showed persistently depressed LV function.  She had a CRT-D implanted in 04/2012.  Follow-up echo in 07/2012 showed LVEF 20% with mild to moderate MR and normal RV function.  Repeat echo in 06/2015 showed improved EF to 55 to 60% and was again 50 to 55% in 2018.  She was seen by Dr. Mitzie Anda in 12/2016 and had been discharged from heart failure clinic given her clinical improvement.  Patient establish care with Dr. Theodis Fiscal on 06/11/2020.  Her lisinopril  has been reduced from 40 mg to 20 mg due to hypotension.  Patient noted that time she is overall been feeling well however she been having problems with bilateral knee and ankle pain.  Patient has started [place] an [place] the mall however this was inconsistent.  Echocardiogram on 03/26/2021 indicated LVEF 50 to 55%, no RWMA, severe asymmetric LVH of the basal septal segment, LV diastolic parameters were consistent with G1 DD, RV systolic function and size was normal, normal artery pressures, aortic valve regurgitation was not visualized, no stenosis was present.  Today she presents for follow-up.  She reports that she   Nonischemic cardiomyopathy/ICD:  CAD:  HTN:   Labwork independently  reviewed:   ROS: .   *** denies chest pain, shortness of breath, lower extremity edema, fatigue, palpitations, melena, hematuria, hemoptysis, diaphoresis, weakness, presyncope, syncope, orthopnea, and PND.  All other systems are reviewed and otherwise negative.  Studies Reviewed: Aaron Aas    EKG:  EKG is ordered today, personally reviewed, demonstrating ***     CV Studies: Cardiac studies reviewed are outlined and summarized above. Otherwise please see EMR for full report. Cardiac Studies & Procedures   ______________________________________________________________________________________________     ECHOCARDIOGRAM  ECHOCARDIOGRAM COMPLETE 03/26/2021  Narrative ECHOCARDIOGRAM REPORT    Patient Name:   Cynthia Roth Date of Exam: 03/26/2021 Medical Rec #:  [id number]                Height:       64.5 in Accession #:    [id number]               Weight:       184.3 lb Date of Birth:  Aug 14, 1950                BSA:          1.900 m Patient Age:    70 years                 BP:           110/72 mmHg Patient Gender: F  HR:           72 bpm. Exam Location:  Outpatient  Procedure: 2D Echo, Color Doppler, Cardiac Doppler and Strain Analysis  Indications:    R06.9 DOE; R60.0 Lower extremity edema  History:        Patient has prior history of Echocardiogram examinations, most recent 01/18/2017. Cardiomyopathy and CHF, Pacemaker and Defibrillator, Arrythmias:LBBB, Signs/Symptoms:Dyspnea, Dizziness/Lightheadedness and Edema; Risk Factors:Hypertension, Dyslipidemia and Non-Smoker. Patient denies chest pain and SOB. She has had DOE and bilateral leg edema for 6 months. She has been experiencing dizziness for over 1 year.  Sonographer:    Richarda Chance RVT, RDCS (AE), RDMS Referring Phys: [id number] TIFFANY [place]  IMPRESSIONS   1. Left ventricular ejection fraction, by estimation, is 50 to 55%. The left ventricle has low normal function. The left ventricle  has no regional wall motion abnormalities. There is severe asymmetric left ventricular hypertrophy of the basal-septal segment. Left ventricular diastolic parameters are consistent with Grade I diastolic dysfunction (impaired relaxation). The average left ventricular global longitudinal strain is -15.2 %. 2. Right ventricular systolic function is normal. The right ventricular size is normal. There is normal pulmonary artery systolic pressure. 3. The mitral valve is normal in structure. Trivial mitral valve regurgitation. No evidence of mitral stenosis. 4. The aortic valve is tricuspid. Aortic valve regurgitation is not visualized. No aortic stenosis is present. 5. The inferior vena cava is normal in size with greater than 50% respiratory variability, suggesting right atrial pressure of 3 mmHg.  Comparison(s): No significant change from prior study. EF 50 %.  Conclusion(s)/Recommendation(s): Low normal LVEF and GLS. Normal left atrial pressure and grade 1 diastolic dysfunction. Normal RVSP. No significant valve disease.  FINDINGS Left Ventricle: Left ventricular ejection fraction, by estimation, is 50 to 55%. The left ventricle has low normal function. The left ventricle has no regional wall motion abnormalities. The average left ventricular global longitudinal strain is -15.2 %. The left ventricular internal cavity size was normal in size. There is severe asymmetric left ventricular hypertrophy of the basal-septal segment. Left ventricular diastolic parameters are consistent with Grade I diastolic dysfunction (impaired relaxation).  Right Ventricle: The right ventricular size is normal. No increase in right ventricular wall thickness. Right ventricular systolic function is normal. There is normal pulmonary artery systolic pressure. The tricuspid regurgitant velocity is 2.20 m/s, and with an assumed right atrial pressure of 3 mmHg, the estimated right ventricular systolic pressure is 22.4  mmHg.  Left Atrium: Left atrial size was normal in size.  Right Atrium: Right atrial size was normal in size.  Pericardium: There is no evidence of pericardial effusion.  Mitral Valve: The mitral valve is normal in structure. Trivial mitral valve regurgitation. No evidence of mitral valve stenosis.  Tricuspid Valve: The tricuspid valve is normal in structure. Tricuspid valve regurgitation is mild . No evidence of tricuspid stenosis.  Aortic Valve: The aortic valve is tricuspid. Aortic valve regurgitation is not visualized. No aortic stenosis is present. Aortic valve mean gradient measures 3.0 mmHg. Aortic valve peak gradient measures 5.6 mmHg. Aortic valve area, by VTI measures 1.94 cm.  Pulmonic Valve: The pulmonic valve was grossly normal. Pulmonic valve regurgitation is mild. No evidence of pulmonic stenosis.  Aorta: The aortic root, ascending aorta, aortic arch and descending aorta are all structurally normal, with no evidence of dilitation or obstruction.  Venous: The inferior vena cava is normal in size with greater than 50% respiratory variability, suggesting right atrial pressure of 3 mmHg.  IAS/Shunts: The  atrial septum is grossly normal.  Additional Comments: A device lead is visualized.   LEFT VENTRICLE PLAX 2D LVIDd:         3.79 cm     Diastology LVIDs:         2.36 cm     LV e' medial:    5.11 cm/s LV PW:         1.23 cm     LV E/e' medial:  11.6 LV IVS:        0.65 cm     LV e' lateral:   7.07 cm/s LVOT diam:     1.90 cm     LV E/e' lateral: 8.4 LV SV:         45 LV SV Index:   24          2D Longitudinal Strain LVOT Area:     2.84 cm    2D Strain GLS (A2C):   -16.2 % 2D Strain GLS (A3C):   -15.1 % 2D Strain GLS (A4C):   -14.2 % LV Volumes (MOD)           2D Strain GLS Avg:     -15.2 % LV vol d, MOD A2C: 53.8 ml LV vol d, MOD A4C: 58.4 ml LV vol s, MOD A2C: 23.5 ml LV vol s, MOD A4C: 26.6 ml 3D Volume EF: LV SV MOD A2C:     30.3 ml 3D EF:        56 % LV SV  MOD A4C:     58.4 ml LV EDV:       101 ml LV SV MOD BP:      32.1 ml LV ESV:       44 ml LV SV:        57 ml  RIGHT VENTRICLE RV S prime:     13.10 cm/s TAPSE (M-mode): 2.0 cm  LEFT ATRIUM             Index        RIGHT ATRIUM           Index LA diam:        2.80 cm 1.47 cm/m   RA Area:     14.30 cm LA Vol (A2C):   60.0 ml 31.58 ml/m  RA Volume:   36.00 ml  18.95 ml/m LA Vol (A4C):   29.2 ml 15.37 ml/m LA Biplane Vol: 42.6 ml 22.42 ml/m AORTIC VALVE                    PULMONIC VALVE AV Area (Vmax):    1.98 cm     PV Vmax:          0.96 m/s AV Area (Vmean):   1.86 cm     PV Peak grad:     3.7 mmHg AV Area (VTI):     1.94 cm     PR End Diast Vel: 4.39 msec AV Vmax:           118.00 cm/s AV Vmean:          79.300 cm/s AV VTI:            0.231 m AV Peak Grad:      5.6 mmHg AV Mean Grad:      3.0 mmHg LVOT Vmax:         82.50 cm/s LVOT Vmean:        52.100 cm/s LVOT VTI:  0.158 m LVOT/AV VTI ratio: 0.68  AORTA Ao Root diam: 3.10 cm Ao Asc diam:  3.10 cm Ao Arch diam: 2.5 cm  MITRAL VALVE               TRICUSPID VALVE MV Area (PHT): 3.87 cm    TR Peak grad:   19.4 mmHg MV Decel Time: 196 msec    TR Vmax:        220.00 cm/s MV E velocity: 59.50 cm/s MV A velocity: 66.90 cm/s  SHUNTS MV E/A ratio:  0.89        Systemic VTI:  0.16 m Systemic Diam: 1.90 cm  Sheryle Donning MD Electronically signed by Sheryle Donning MD Signature Date/Time: 03/27/2021/9:23:07 AM    Final          ______________________________________________________________________________________________       Current Reported Medications:.    No outpatient medications have been marked as taking for the 11/09/23 encounter (Appointment) with Jowanda Heeg D, NP.    Physical Exam:    VS:  There were no vitals taken for this visit.   Wt Readings from Last 3 Encounters:  08/29/23 192 lb 6.4 oz (87.3 kg)  06/06/23 196 lb 3.2 oz (89 kg)  05/02/23 200 lb 3.2 oz (90.8 kg)     GEN: Well nourished, well developed in no acute distress NECK: No JVD; No carotid bruits CARDIAC: ***RRR, no murmurs, rubs, gallops RESPIRATORY:  Clear to auscultation without rales, wheezing or rhonchi  ABDOMEN: Soft, non-tender, non-distended EXTREMITIES:  No edema; No acute deformity     Asessement and Plan:.     ***     Disposition: F/u with ***  Signed, Vernon Maish D Tasha Diaz, NP

## 2023-11-07 ENCOUNTER — Ambulatory Visit

## 2023-11-07 DIAGNOSIS — I428 Other cardiomyopathies: Secondary | ICD-10-CM

## 2023-11-07 DIAGNOSIS — I5022 Chronic systolic (congestive) heart failure: Secondary | ICD-10-CM

## 2023-11-07 LAB — CUP PACEART REMOTE DEVICE CHECK
Battery Remaining Longevity: 3 mo
Battery Voltage: 2.79 V
Brady Statistic RV Percent Paced: 17.22 %
Date Time Interrogation Session: 20250609003739
HighPow Impedance: 100 Ohm
Implantable Lead Connection Status: 753985
Implantable Lead Connection Status: 753985
Implantable Lead Connection Status: 753985
Implantable Lead Implant Date: 20131211
Implantable Lead Implant Date: 20131211
Implantable Lead Implant Date: 20131211
Implantable Lead Location: 753858
Implantable Lead Location: 753859
Implantable Lead Location: 753860
Implantable Lead Model: 181
Implantable Lead Model: 4396
Implantable Lead Model: 5076
Implantable Lead Serial Number: 32342
Implantable Pulse Generator Implant Date: 20200724
Lead Channel Impedance Value: 304 Ohm
Lead Channel Impedance Value: 418 Ohm
Lead Channel Impedance Value: 475 Ohm
Lead Channel Impedance Value: 475 Ohm
Lead Channel Impedance Value: 494 Ohm
Lead Channel Impedance Value: 817 Ohm
Lead Channel Pacing Threshold Amplitude: 0.75 V
Lead Channel Pacing Threshold Amplitude: 0.875 V
Lead Channel Pacing Threshold Amplitude: 2.375 V
Lead Channel Pacing Threshold Pulse Width: 0.4 ms
Lead Channel Pacing Threshold Pulse Width: 0.4 ms
Lead Channel Pacing Threshold Pulse Width: 1.5 ms
Lead Channel Sensing Intrinsic Amplitude: 2.8 mV
Lead Channel Sensing Intrinsic Amplitude: 9.1 mV
Lead Channel Setting Pacing Amplitude: 1.5 V
Lead Channel Setting Pacing Amplitude: 2.5 V
Lead Channel Setting Pacing Amplitude: 3 V
Lead Channel Setting Pacing Pulse Width: 0.4 ms
Lead Channel Setting Pacing Pulse Width: 1.5 ms
Lead Channel Setting Sensing Sensitivity: 0.45 mV
Zone Setting Status: 755011
Zone Setting Status: 755011

## 2023-11-09 ENCOUNTER — Encounter: Payer: Self-pay | Admitting: Cardiology

## 2023-11-09 ENCOUNTER — Ambulatory Visit: Attending: Cardiology | Admitting: Cardiology

## 2023-11-09 VITALS — BP 118/68 | HR 82 | Ht 63.0 in | Wt 191.0 lb

## 2023-11-09 DIAGNOSIS — E782 Mixed hyperlipidemia: Secondary | ICD-10-CM | POA: Diagnosis not present

## 2023-11-09 DIAGNOSIS — Z79899 Other long term (current) drug therapy: Secondary | ICD-10-CM

## 2023-11-09 DIAGNOSIS — I517 Cardiomegaly: Secondary | ICD-10-CM | POA: Diagnosis not present

## 2023-11-09 DIAGNOSIS — I10 Essential (primary) hypertension: Secondary | ICD-10-CM

## 2023-11-09 DIAGNOSIS — I5022 Chronic systolic (congestive) heart failure: Secondary | ICD-10-CM

## 2023-11-09 DIAGNOSIS — I428 Other cardiomyopathies: Secondary | ICD-10-CM | POA: Diagnosis not present

## 2023-11-09 DIAGNOSIS — Z95810 Presence of automatic (implantable) cardiac defibrillator: Secondary | ICD-10-CM

## 2023-11-09 MED ORDER — METOPROLOL SUCCINATE ER 25 MG PO TB24: 25.0000 mg | ORAL_TABLET | Freq: Every day | ORAL | 3 refills | Status: AC

## 2023-11-09 NOTE — Patient Instructions (Signed)
 Medication Instructions:  Start Metoprolol  Succinate 25 mg once a day *If you need a refill on your cardiac medications before your next appointment, please call your pharmacy*  Lab Work: Today we are going to draw a Cmet, CBC, and Lipid panel If you have labs (blood work) drawn today and your tests are completely normal, you will receive your results only by: MyChart Message (if you have MyChart) OR A paper copy in the mail If you have any lab test that is abnormal or we need to change your treatment, we will call you to review the results.  Testing/Procedures: Your physician has requested that you have an echocardiogram. Echocardiography is a painless test that uses sound waves to create images of your heart. It provides your doctor with information about the size and shape of your heart and how well your heart's chambers and valves are working. This procedure takes approximately one hour. There are no restrictions for this procedure. Please do NOT wear cologne, perfume, aftershave, or lotions (deodorant is allowed). Please arrive 15 minutes prior to your appointment time.  Please note: We ask at that you not bring children with you during ultrasound (echo/ vascular) testing. Due to room size and safety concerns, children are not allowed in the ultrasound rooms during exams. Our front office staff cannot provide observation of children in our lobby area while testing is being conducted. An adult accompanying a patient to their appointment will only be allowed in the ultrasound room at the discretion of the ultrasound technician under special circumstances. We apologize for any inconvenience.  Follow-Up: At [facility], you and your health needs are our priority.  As part of our continuing mission to provide you with exceptional heart care, our providers are all part of one team.  This team includes your primary Cardiologist (physician) and Advanced Practice Providers or APPs (Physician  Assistants and Nurse Practitioners) who all work together to provide you with the care you need, when you need it.  Your next appointment:   After Echo  Provider:   Katlyn West, NP, Then, Maudine Sos, MD will plan to see you again in 4-5 month(s).   We recommend signing up for the patient portal called MyChart.  Sign up information is provided on this After Visit Summary.  MyChart is used to connect with patients for Virtual Visits (Telemedicine).  Patients are able to view lab/test results, encounter notes, upcoming appointments, etc.  Non-urgent messages can be sent to your provider as well.   To learn more about what you can do with MyChart, go to [email or web address].

## 2023-11-10 ENCOUNTER — Ambulatory Visit: Payer: Self-pay | Admitting: Cardiology

## 2023-11-10 DIAGNOSIS — E782 Mixed hyperlipidemia: Secondary | ICD-10-CM

## 2023-11-10 DIAGNOSIS — Z79899 Other long term (current) drug therapy: Secondary | ICD-10-CM

## 2023-11-10 LAB — CBC
Hematocrit: 42 % (ref 34.0–46.6)
Hemoglobin: 13.9 g/dL (ref 11.1–15.9)
MCH: 32 pg (ref 26.6–33.0)
MCHC: 33.1 g/dL (ref 31.5–35.7)
MCV: 97 fL (ref 79–97)
Platelets: 275 10*3/uL (ref 150–450)
RBC: 4.34 x10E6/uL (ref 3.77–5.28)
RDW: 12.6 % (ref 11.7–15.4)
WBC: 5.2 10*3/uL (ref 3.4–10.8)

## 2023-11-10 LAB — COMPREHENSIVE METABOLIC PANEL WITH GFR
ALT: 8 IU/L (ref 0–32)
AST: 19 IU/L (ref 0–40)
Albumin: 4.6 g/dL (ref 3.8–4.8)
Alkaline Phosphatase: 88 IU/L (ref 44–121)
BUN/Creatinine Ratio: 15 (ref 12–28)
BUN: 14 mg/dL (ref 8–27)
Bilirubin Total: 0.6 mg/dL (ref 0.0–1.2)
CO2: 22 mmol/L (ref 20–29)
Calcium: 10.1 mg/dL (ref 8.7–10.3)
Chloride: 104 mmol/L (ref 96–106)
Creatinine, Ser: 0.96 mg/dL (ref 0.57–1.00)
Globulin, Total: 2.9 g/dL (ref 1.5–4.5)
Glucose: 90 mg/dL (ref 70–99)
Potassium: 4 mmol/L (ref 3.5–5.2)
Sodium: 143 mmol/L (ref 134–144)
Total Protein: 7.5 g/dL (ref 6.0–8.5)
eGFR: 63 mL/min/{1.73_m2} (ref >59)

## 2023-11-10 LAB — LIPID PANEL
Chol/HDL Ratio: 4.7 ratio — ABNORMAL HIGH (ref 0.0–4.4)
Cholesterol, Total: 242 mg/dL — ABNORMAL HIGH (ref 100–199)
HDL: 51 mg/dL (ref >39)
LDL Chol Calc (NIH): 176 mg/dL — ABNORMAL HIGH (ref 0–99)
Triglycerides: 84 mg/dL (ref 0–149)
VLDL Cholesterol Cal: 15 mg/dL (ref 5–40)

## 2023-11-14 NOTE — Progress Notes (Signed)
 Remote ICD transmission.

## 2023-11-15 MED ORDER — ATORVASTATIN CALCIUM 20 MG PO TABS: 20.0000 mg | ORAL_TABLET | Freq: Every day | ORAL | 3 refills | Status: DC

## 2023-11-15 NOTE — Telephone Encounter (Signed)
-----   Message from Katlyn D West sent at 11/10/2023  7:51 AM EDT ----- Please let Cynthia Roth know that her CBC shows no evidence of anemia or infection, her kidney function is normal and her electrolytes are normal.  Her liver function is also normal.  Her LDL or bad  cholesterol is elevated, recommend restarting atorvastatin  20 mg daily.  She will need to recheck of her fasting lipid profile and LFTs in 2 months. ----- Message ----- From: Interface, [facility] Lab Results In Sent: 11/09/2023  11:36 PM EDT To: Katlyn D West, NP

## 2023-11-15 NOTE — Telephone Encounter (Signed)
 Called patient advised of below they verbalized understanding Will have to send a letter for the patient. Will order the labs tomorrow when I get back to [employer]

## 2023-11-22 NOTE — Telephone Encounter (Signed)
-----   Message from Cynthia Roth sent at 11/10/2023  7:51 AM EDT ----- Please let [name] know that her CBC shows no evidence of anemia or infection, her kidney function is normal and her electrolytes are normal.  Her liver function is also normal.  Her LDL or bad  cholesterol is elevated, recommend restarting atorvastatin  20 mg daily.  She will need to recheck of her fasting lipid profile and LFTs in 2 months. ----- Message ----- From: Interface, [facility] Lab Results In Sent: 11/09/2023  11:36 PM EDT To: Cynthia D West, NP

## 2023-11-22 NOTE — Telephone Encounter (Signed)
Sent labs to patient.

## 2023-12-08 ENCOUNTER — Ambulatory Visit

## 2023-12-08 DIAGNOSIS — I5022 Chronic systolic (congestive) heart failure: Secondary | ICD-10-CM

## 2023-12-08 LAB — CUP PACEART REMOTE DEVICE CHECK
Battery Remaining Longevity: 1 mo
Battery Voltage: 2.82 V
Brady Statistic RV Percent Paced: 12.3 %
Date Time Interrogation Session: 20250709191716
HighPow Impedance: 105 Ohm
Implantable Lead Connection Status: 753985
Implantable Lead Connection Status: 753985
Implantable Lead Connection Status: 753985
Implantable Lead Implant Date: 20131211
Implantable Lead Implant Date: 20131211
Implantable Lead Implant Date: 20131211
Implantable Lead Location: 753858
Implantable Lead Location: 753859
Implantable Lead Location: 753860
Implantable Lead Model: 181
Implantable Lead Model: 4396
Implantable Lead Model: 5076
Implantable Lead Serial Number: 32342
Implantable Pulse Generator Implant Date: 20200724
Lead Channel Impedance Value: 323 Ohm
Lead Channel Impedance Value: 475 Ohm
Lead Channel Impedance Value: 532 Ohm
Lead Channel Impedance Value: 551 Ohm
Lead Channel Impedance Value: 551 Ohm
Lead Channel Impedance Value: 931 Ohm
Lead Channel Pacing Threshold Amplitude: 0.625 V
Lead Channel Pacing Threshold Amplitude: 0.75 V
Lead Channel Pacing Threshold Amplitude: 2.375 V
Lead Channel Pacing Threshold Pulse Width: 0.4 ms
Lead Channel Pacing Threshold Pulse Width: 0.4 ms
Lead Channel Pacing Threshold Pulse Width: 1.5 ms
Lead Channel Sensing Intrinsic Amplitude: 10.3 mV
Lead Channel Sensing Intrinsic Amplitude: 2.9 mV
Lead Channel Setting Pacing Amplitude: 1.5 V
Lead Channel Setting Pacing Amplitude: 2.5 V
Lead Channel Setting Pacing Amplitude: 3 V
Lead Channel Setting Pacing Pulse Width: 0.4 ms
Lead Channel Setting Pacing Pulse Width: 1.5 ms
Lead Channel Setting Sensing Sensitivity: 0.45 mV
Zone Setting Status: 755011
Zone Setting Status: 755011

## 2023-12-12 ENCOUNTER — Other Ambulatory Visit: Payer: Self-pay

## 2023-12-22 NOTE — Progress Notes (Signed)
 Remote ICD transmission.

## 2023-12-23 ENCOUNTER — Ambulatory Visit (HOSPITAL_COMMUNITY)
Admission: RE | Admit: 2023-12-23 | Discharge: 2023-12-23 | Disposition: A | Discharge status: Home / Self Care | Source: Ambulatory Visit | Attending: Cardiology | Admitting: Cardiology

## 2023-12-23 DIAGNOSIS — I5022 Chronic systolic (congestive) heart failure: Secondary | ICD-10-CM | POA: Diagnosis not present

## 2023-12-23 DIAGNOSIS — I517 Cardiomegaly: Secondary | ICD-10-CM | POA: Insufficient documentation

## 2023-12-23 LAB — ECHOCARDIOGRAM COMPLETE
Area-P 1/2: 4.39 cm2
Calc EF: 24 %
S' Lateral: 4.9 cm
Single Plane A2C EF: 26.2 %
Single Plane A4C EF: 22.4 %

## 2023-12-27 ENCOUNTER — Telehealth: Payer: Self-pay

## 2023-12-27 NOTE — Telephone Encounter (Signed)
 Please let Ms. Perleberg know that her echocardiogram indicates her EF has again reduced, when seen in office she was asymptomatic, she should monitor for increased shortness of breath, weight gain and lower extremity edema and notify the office if present. She needs an urgent follow up with EP as on review of her device the longevity is listed as only one month, she has not seen Dr. Fernande in over a year.  Per Katlyn West NP

## 2023-12-27 NOTE — Telephone Encounter (Signed)
 Called patient advised of below they verbalized understanding Patient reports that they are not symptomatic gave patient the phone number to the office.

## 2023-12-30 ENCOUNTER — Ambulatory Visit: Admitting: Cardiology

## 2024-01-08 NOTE — Progress Notes (Signed)
 Electrophysiology Office Note:   Date:  01/12/2024  ID:  Cynthia Roth, Cynthia Roth [birth year], MRN [id number]  Primary Cardiologist: Annabella Scarce, MD Electrophysiologist: Fonda Kitty, MD      History of Present Illness:   Cynthia Roth is a 73 y.o. female with h/o chronic systolic heart failure secondary to nonischemic cardiomyopathy and left bundle branch block status post CRT-D, hypertension who is being seen today for CRT-D follow-up.  Discussed the use of AI scribe software for clinical note transcription with the patient, who gave verbal consent to proceed.  History of Present Illness Cynthia Roth is a 73 year old female with heart failure and a pacemaker who presents with concerns about her pacemaker function and heart function.  She has a history of heart failure for which a CRT-D  was implanted years ago due to low heart pumping function. Initially, the device improved her heart function, and she did well for a period of time. She feels 'okay' but not fine, and denies feeling worse over the past six months compared to a year ago. She experiences swelling in her legs, particularly in the right leg, which is not new and does not appear to be worsening. She applies a topical treatment to alleviate the pain associated with the swelling. No issues with breathing, including shortness of breath or difficulty breathing while lying down.  No new or acute complaints today.  Review of systems complete and found to be negative unless listed in HPI.   EP Information / Studies Reviewed:    EKG is ordered today. Personal review as below.  EKG Interpretation Date/Time:  Monday January 09 2024 12:00:45 EDT Ventricular Rate:  88 PR Interval:    QRS Duration:  150 QT Interval:  456 QTC Calculation: 551 R Axis:   123  Text Interpretation: Bi Ventricular-paced rhythm When compared with ECG of 09-Jan-2024 10:55, Vent. rate has increased BY   4 BPM Confirmed by  Kitty Fonda [phone]) on 01/12/2024 10:49:47 AM   Echo 11/2023:  1. Left ventricular ejection fraction, by estimation, is 20 to 25%. Left  ventricular ejection fraction by 3D volume is 22 %. Left ventricular  ejection fraction by 2D MOD biplane is 24.0 %. The left ventricle has  severely decreased function. The left  ventricle demonstrates global hypokinesis with septal-lateral dyssynchrony  suggestive of LBBB. The left ventricular internal cavity size was mildly  dilated. Left ventricular diastolic parameters are consistent with Grade I  diastolic dysfunction (impaired   relaxation).   2. Right ventricular systolic function is mildly reduced. The right  ventricular size is normal. Tricuspid regurgitation signal is inadequate  for assessing PA pressure.   3. Left atrial size was mild to moderately dilated.   4. The mitral valve is normal in structure. Mild mitral valve  regurgitation. No evidence of mitral stenosis.   5. The aortic valve is tricuspid. Aortic valve regurgitation is not  visualized. No aortic stenosis is present.   6. The inferior vena cava is normal in size with greater than 50%  respiratory variability, suggesting right atrial pressure of 3 mmHg.       Physical Exam:   VS:  BP 122/80 (BP Location: Right Arm, Patient Position: Sitting, Cuff Size: Large)   Pulse 88   Ht 5' 3 (1.6 m)   Wt 190 lb (86.2 kg)   SpO2 97%   BMI 33.66 kg/m    Wt Readings from Last 3 Encounters:  01/09/24 190 lb (86.2 kg)  11/09/23 191  lb (86.6 kg)  08/29/23 192 lb 6.4 oz (87.3 kg)     GEN: Well nourished, well developed in no acute distress NECK: No JVD CARDIAC: Normal rate, regular rhythm.  Well-healed left chest ICD pocket. RESPIRATORY:  Clear to auscultation without rales, wheezing or rhonchi  ABDOMEN: Soft, non-distended EXTREMITIES:  No edema; No deformity   ASSESSMENT AND PLAN:    #S/p CRT-D: 12-lead ECG is consistent with RV apical pacing.  This is different from her prior  BiV paced ECGs. This is likely due to lack of capture from CS lead; she has chronically elevated capture thresholds. Of note, EF appears to have declined in the setting which I suspect is due to lack of adequate BiV pacing. #Pacemaker lead malfunction: - In clinic device interrogation was performed today.  Appropriate device function and stable lead parameters.  Chronically elevated CS capture threshold.  We tested multiple vectors and there is no way to program around this, albeit this is only a bipolar lead.  We did see more appropriate biventricular pacing by maximally pre-exciting the LV lead and increasing the output.  She has 1 month estimated longevity until ERI.  I believe the best option would be implanting a new CS or left bundle branch area pacing lead, most likely the latter, at the time of generator change.  This would require either patency of the left axillary vein, which I am doubtful of given the age of her device, or tunneling from the right chest.  We would abandon her old CS lead.  Patient would like for me to discuss this plan with her son.  I will try to call him so that we can finalize plan at time of generator change. - Continue remote monitoring.  #Chronic systolic heart failure: LVEF initially recovered after CRT implant.  Most recent echo shows drop in LVEF to 20-25%.  I suspect this is due to lack of effective BiV pacing.  Appears reasonably compensated on exam.  She does have chronic lower extremity edema. #Non-ischemic cardiomyopathy:  #LBBB: -Continue close follow-up with general cardiology. -Continue metoprolol  and spironolactone .  Follow up with Dr. Kennyth at time of generator change.   Signed, Fonda Kennyth, MD

## 2024-01-09 ENCOUNTER — Ambulatory Visit

## 2024-01-09 ENCOUNTER — Ambulatory Visit: Attending: Cardiology | Admitting: Cardiology

## 2024-01-09 ENCOUNTER — Encounter: Payer: Self-pay | Admitting: Cardiology

## 2024-01-09 VITALS — BP 122/80 | HR 88 | Ht 63.0 in | Wt 190.0 lb

## 2024-01-09 DIAGNOSIS — I447 Left bundle-branch block, unspecified: Secondary | ICD-10-CM

## 2024-01-09 DIAGNOSIS — T82110A Breakdown (mechanical) of cardiac electrode, initial encounter: Secondary | ICD-10-CM | POA: Diagnosis not present

## 2024-01-09 DIAGNOSIS — Z95810 Presence of automatic (implantable) cardiac defibrillator: Secondary | ICD-10-CM | POA: Diagnosis not present

## 2024-01-09 DIAGNOSIS — I428 Other cardiomyopathies: Secondary | ICD-10-CM

## 2024-01-09 DIAGNOSIS — I5022 Chronic systolic (congestive) heart failure: Secondary | ICD-10-CM

## 2024-01-09 LAB — CUP PACEART INCLINIC DEVICE CHECK
Date Time Interrogation Session: 20250811134212
Implantable Lead Connection Status: 753985
Implantable Lead Connection Status: 753985
Implantable Lead Connection Status: 753985
Implantable Lead Implant Date: 20131211
Implantable Lead Implant Date: 20131211
Implantable Lead Implant Date: 20131211
Implantable Lead Location: 753858
Implantable Lead Location: 753859
Implantable Lead Location: 753860
Implantable Lead Model: 181
Implantable Lead Model: 4396
Implantable Lead Model: 5076
Implantable Lead Serial Number: 32342
Implantable Pulse Generator Implant Date: 20200724

## 2024-01-09 NOTE — Patient Instructions (Signed)
 Medication Instructions:  Your physician recommends that you continue on your current medications as directed. Please refer to the Current Medication list given to you today.  *If you need a refill on your cardiac medications before your next appointment, please call your pharmacy*  Testing/Procedures: ICD Generator Change and Lead Addition - we will contact you when its time to schedule  Follow-Up: At [facility], you and your health needs are our priority.  As part of our continuing mission to provide you with exceptional heart care, our providers are all part of one team.  This team includes your primary Cardiologist (physician) and Advanced Practice Providers or APPs (Physician Assistants and Nurse Practitioners) who all work together to provide you with the care you need, when you need it.

## 2024-01-10 LAB — CUP PACEART REMOTE DEVICE CHECK
Battery Remaining Longevity: 1 mo
Battery Voltage: 2.8 V
Brady Statistic RV Percent Paced: 11.36 %
Date Time Interrogation Session: 20250810192717
HighPow Impedance: 95 Ohm
Implantable Lead Connection Status: 753985
Implantable Lead Connection Status: 753985
Implantable Lead Connection Status: 753985
Implantable Lead Implant Date: 20131211
Implantable Lead Implant Date: 20131211
Implantable Lead Implant Date: 20131211
Implantable Lead Location: 753858
Implantable Lead Location: 753859
Implantable Lead Location: 753860
Implantable Lead Model: 181
Implantable Lead Model: 4396
Implantable Lead Model: 5076
Implantable Lead Serial Number: 32342
Implantable Pulse Generator Implant Date: 20200724
Lead Channel Impedance Value: 323 Ohm
Lead Channel Impedance Value: 513 Ohm
Lead Channel Impedance Value: 513 Ohm
Lead Channel Impedance Value: 532 Ohm
Lead Channel Impedance Value: 532 Ohm
Lead Channel Impedance Value: 893 Ohm
Lead Channel Pacing Threshold Amplitude: 0.75 V
Lead Channel Pacing Threshold Amplitude: 0.75 V
Lead Channel Pacing Threshold Amplitude: 2.375 V
Lead Channel Pacing Threshold Pulse Width: 0.4 ms
Lead Channel Pacing Threshold Pulse Width: 0.4 ms
Lead Channel Pacing Threshold Pulse Width: 1.5 ms
Lead Channel Sensing Intrinsic Amplitude: 2.8 mV
Lead Channel Sensing Intrinsic Amplitude: 9.6 mV
Lead Channel Setting Pacing Amplitude: 1.5 V
Lead Channel Setting Pacing Amplitude: 2.5 V
Lead Channel Setting Pacing Amplitude: 3 V
Lead Channel Setting Pacing Pulse Width: 0.4 ms
Lead Channel Setting Pacing Pulse Width: 1.5 ms
Lead Channel Setting Sensing Sensitivity: 0.45 mV
Zone Setting Status: 755011
Zone Setting Status: 755011

## 2024-01-11 ENCOUNTER — Ambulatory Visit: Payer: Self-pay | Admitting: Cardiology

## 2024-01-13 ENCOUNTER — Telehealth: Payer: Self-pay

## 2024-01-13 NOTE — Telephone Encounter (Signed)
 ICD reached RRT 01/13/24.   Per Dr. Shaune last OV note,  I believe the best option would be implanting a new CS or left bundle branch area pacing lead, most likely the latter, at the time of generator change.  This would require either patency of the left axillary vein, which I am doubtful of given the age of her device, or tunneling from the right chest.  We would abandon her old CS lead.  Patient would like for me to discuss this plan with her son.  I will try to call him so that we can finalize plan at time of generator change.  Routing to Dr. Kennyth for follow up.

## 2024-01-18 NOTE — Progress Notes (Unsigned)
 Cardiology Office Note    Date:  01/19/2024  ID:  Kentrell, Guettler 1951-02-11, MRN [id number] PCP:  Mercer Clotilda SAUNDERS, MD  Cardiologist:  Annabella Scarce, MD  Electrophysiologist:  Fonda Kitty, MD   Chief Complaint: Follow up for HFrEF   History of Present Illness: .    Cynthia Roth is a 73 y.o. female with visit-pertinent history of chronic systolic and diastolic heart failure resolved after CRT-D, CAD, hypertension and left bundle branch block.  Patient was previously followed by Dr. Rolan.  She was admitted to [facility] in 11/2011 with acute systolic and diastolic heart failure.  Her LV was severely dilated and LVEF was 15%, also had a left bundle branch block.  LHC at that time showed mild, nonobstructive CAD.  Repeat echo 3 months later showed persistently depressed LV function.  She had a CRT-D implanted in 04/2012.  Follow-up echo in 07/2012 showed LVEF 20% with mild to moderate MR and normal RV function.  Repeat echo in 06/2015 showed improved EF to 55 to 60% and was again 50 to 55% in 2018.  She was seen by Dr. Rolan in 12/2016 and had been discharged from heart failure clinic given her clinical improvement.  Patient establish care with Dr. Scarce on 06/11/2020.  Her lisinopril  has been reduced from 40 mg to 20 mg due to hypotension.  Patient noted that time she is overall been feeling well however she been having problems with bilateral knee and ankle pain.  Patient has started walking for an hour at the mall however this was inconsistent.  Echocardiogram on 03/26/2021 indicated LVEF 50 to 55%, no RWMA, severe asymmetric LVH of the basal septal segment, LV diastolic parameters were consistent with G1 DD, RV systolic function and size was normal, normal artery pressures, aortic valve regurgitation was not visualized, no stenosis was present.  Patient was seen in clinic on 11/09/2023, she reported she been doing well.  She reported she had not been exercising  lately as she had been busy taking care of numerous family members.  She reported she had also stopped a majority of her medications, was unsure as to why.  Patient's metoprolol  succinate 25 mg daily was resumed and echocardiogram was checked.  Echocardiogram on 12/23/2023 indicated LVEF 20 to 25%, global hypokinesis with septal lateral dyssynchrony suggestive of left bundle branch block, internal cavity size was mildly dilated, G1 DD, RV systolic function was mildly reduced, there are no significant valve abnormalities.  Patient followed up with Dr. Kitty to establish care and to check on her device battery.  It was noted that patient had a lack of adequate BiV pacing, noted to have 1 month estimated longevity until ERI or, felt that best option would be to implant a new CS or left bundle branch area pacing lead at time of generator change.  Today she presents for follow-up.  She reports that she has been doing, ok. She notes she has a lot going on, notes a family member is currently in the hospital. She reports she is the oldest of all of her siblings and enjoys caring for others. She notes she has been fatigued in recent weeks, and feels she has not been very active however notes that she is regularly going and visiting people throughout the day and attending her church twice a week.  Appears to tolerate this very well.  She denies any chest pain, shortness of breath, lower extremity edema, orthopnea or PND.  She denies any palpitations,  presyncope or syncope.  ROS: .   Today she denies chest pain, shortness of breath, lower extremity edema, palpitations, melena, hematuria, hemoptysis, diaphoresis, weakness, presyncope, syncope, orthopnea, and PND.  All other systems are reviewed and otherwise negative. Studies Reviewed: SABRA   EKG:  EKG is not ordered today.  CV Studies: Cardiac studies reviewed are outlined and summarized above. Otherwise please see EMR for full report. Cardiac Studies & Procedures    ______________________________________________________________________________________________     ECHOCARDIOGRAM  ECHOCARDIOGRAM COMPLETE 12/23/2023  Narrative ECHOCARDIOGRAM REPORT    Patient Name:   Cynthia Roth Date of Exam: 12/23/2023 Medical Rec #:  [id number]                Height:       63.0 in Accession #:    [id number]               Weight:       191.0 lb Date of Birth:  1951/05/19                BSA:          1.896 m Patient Age:    73 years                 BP:           118/68 mmHg Patient Gender: F                        HR:           81 bpm. Exam Location:  [address]  Procedure: 2D Echo, Cardiac Doppler, Color Doppler and 3D Echo (Both Spectral and Color Flow Doppler were utilized during procedure).  Indications:    Heart Failure I50.9; LVH  History:        Patient has prior history of Echocardiogram examinations, most recent 03/27/2021. CAD, Defibrillator, Arrythmias:LBBB; Risk Factors:Hypertension and Dyslipidemia.  Sonographer:    Augustin Seals RDCS Referring Phys: [id number] Hammond Obeirne D Ayvin Lipinski  IMPRESSIONS   1. Left ventricular ejection fraction, by estimation, is 20 to 25%. Left ventricular ejection fraction by 3D volume is 22 %. Left ventricular ejection fraction by 2D MOD biplane is 24.0 %. The left ventricle has severely decreased function. The left ventricle demonstrates global hypokinesis with septal-lateral dyssynchrony suggestive of LBBB. The left ventricular internal cavity size was mildly dilated. Left ventricular diastolic parameters are consistent with Grade I diastolic dysfunction (impaired relaxation). 2. Right ventricular systolic function is mildly reduced. The right ventricular size is normal. Tricuspid regurgitation signal is inadequate for assessing PA pressure. 3. Left atrial size was mild to moderately dilated. 4. The mitral valve is normal in structure. Mild mitral valve regurgitation. No evidence of mitral stenosis. 5. The  aortic valve is tricuspid. Aortic valve regurgitation is not visualized. No aortic stenosis is present. 6. The inferior vena cava is normal in size with greater than 50% respiratory variability, suggesting right atrial pressure of 3 mmHg.  FINDINGS Left Ventricle: Left ventricular ejection fraction, by estimation, is 20 to 25%. Left ventricular ejection fraction by 2D MOD biplane is 24.0 %. Left ventricular ejection fraction by 3D volume is 22 %. The left ventricle has severely decreased function. The left ventricle demonstrates global hypokinesis. The left ventricular internal cavity size was mildly dilated. There is no left ventricular hypertrophy. Left ventricular diastolic parameters are consistent with Grade I diastolic dysfunction (impaired relaxation).  Right Ventricle: The right ventricular size is normal. No increase in right ventricular  wall thickness. Right ventricular systolic function is mildly reduced. Tricuspid regurgitation signal is inadequate for assessing PA pressure.  Left Atrium: Left atrial size was mild to moderately dilated.  Right Atrium: Right atrial size was normal in size.  Pericardium: There is no evidence of pericardial effusion.  Mitral Valve: The mitral valve is normal in structure. Mild mitral valve regurgitation. No evidence of mitral valve stenosis.  Tricuspid Valve: The tricuspid valve is normal in structure. Tricuspid valve regurgitation is not demonstrated.  Aortic Valve: The aortic valve is tricuspid. Aortic valve regurgitation is not visualized. No aortic stenosis is present.  Pulmonic Valve: The pulmonic valve was normal in structure. Pulmonic valve regurgitation is not visualized.  Aorta: The aortic root is normal in size and structure.  Venous: The inferior vena cava is normal in size with greater than 50% respiratory variability, suggesting right atrial pressure of 3 mmHg.  IAS/Shunts: No atrial level shunt detected by color flow  Doppler.  Additional Comments: 3D was performed not requiring image post processing on an independent workstation and was abnormal. A device lead is visualized in the right ventricle.   LEFT VENTRICLE PLAX 2D                        Biplane EF (MOD) LVIDd:         5.30 cm         LV Biplane EF:   Left LVIDs:         4.90 cm                          ventricular LV PW:         1.00 cm                          ejection LV IVS:        0.90 cm                          fraction by LVOT diam:     2.10 cm                          2D MOD LV SV:         54                               biplane is LV SV Index:   28                               24.0 %. LVOT Area:     3.46 cm  LV Volumes (MOD)               3D Volume EF LV vol d, MOD    164.0 ml      LV 3D EF:    Left A2C:                                        ventricul LV vol d, MOD    192.0 ml                   ar A4C:  ejection LV vol s, MOD    121.0 ml                   fraction A2C:                                        by 3D LV vol s, MOD    149.0 ml                   volume is A4C:                                        22 %. LV SV MOD A2C:   43.0 ml LV SV MOD A4C:   192.0 ml LV SV MOD BP:    42.9 ml       3D Volume EF: 3D EF:        22 % LV EDV:       217 ml LV ESV:       169 ml LV SV:        48 ml  RIGHT VENTRICLE             IVC RV Basal diam:  2.90 cm     IVC diam: 1.80 cm RV Mid diam:    1.60 cm RV S prime:     11.20 cm/s TAPSE (M-mode): 1.5 cm  LEFT ATRIUM             Index        RIGHT ATRIUM           Index LA diam:        3.40 cm 1.79 cm/m   RA Area:     14.10 cm LA Vol (A2C):   85.1 ml 44.88 ml/m  RA Volume:   29.70 ml  15.66 ml/m LA Vol (A4C):   45.1 ml 23.78 ml/m LA Biplane Vol: 65.0 ml 34.28 ml/m AORTIC VALVE LVOT Vmax:   77.90 cm/s LVOT Vmean:  54.050 cm/s LVOT VTI:    0.155 m  AORTA Ao Root diam: 2.80 cm Ao Asc diam:  3.10 cm  MITRAL VALVE MV Area (PHT):  4.39 cm    SHUNTS MV Decel Time: 173 msec    Systemic VTI:  0.15 m MV E velocity: 55.50 cm/s  Systemic Diam: 2.10 cm MV A velocity: 92.20 cm/s MV E/A ratio:  0.60  Dalton McleanMD Electronically signed by Ezra Kanner Signature Date/Time: 12/23/2023/5:24:40 PM    Final          ______________________________________________________________________________________________       Current Reported Medications:.    Current Meds  Medication Sig   aspirin  81 MG tablet Take 81 mg by mouth daily.   atorvastatin  (LIPITOR) 20 MG tablet Take 1 tablet (20 mg total) by mouth daily.   metoprolol  succinate (TOPROL -XL) 25 MG 24 hr tablet Take 1 tablet (25 mg total) by mouth daily.    Physical Exam:    VS:  BP 106/72   Pulse 82   Ht 5' 3 (1.6 m)   Wt 191 lb 12.8 oz (87 kg)   SpO2 97%   BMI 33.98 kg/m    Wt Readings from Last 3 Encounters:  01/19/24 191 lb 12.8 oz (87 kg)  01/09/24 190 lb (86.2 kg)  11/09/23 191 lb (  86.6 kg)    GEN: Well nourished, well developed in no acute distress NECK: No JVD; No carotid bruits CARDIAC: RRR, no murmurs, rubs, gallops RESPIRATORY:  Clear to auscultation without rales, wheezing or rhonchi  ABDOMEN: Soft, non-tender, non-distended EXTREMITIES:  No edema; No acute deformity     Asessement and Plan:.    Nonischemic cardiomyopathy/ICD/severe LVH: Echo in 2013 indicated LVEF 15% with severely dilated LV and left bundle branch block.  LHC showed at the time showed mild nonobstructive CAD.  Patient had CRT-D implanted in 04/2012.  Patient with recovery of EF in 2017, discharge from heart failure clinic in 2018.  Last echo in 2022 indicated LVEF 50 to 55%, no RWMA, severe asymmetric LVH of the basal septal segment, LV diastolic parameters are consistent with G1 DD, RV systolic function and size was normal.  Office visit on 6/11 patient's reported that she had stopped all of her medications, was restarted on metoprolol  succinate 25 mg daily.  Echo  12/23/2023 indicated LVEF 20 to 25%, global hypokinesis with septal lateral dyssynchrony suggestive of left bundle branch block, internal cavity size was mildly dilated, G1 DD, RV systolic function was mildly reduced, there are no significant valve abnormalities. Seen by Dr. Kennyth, felt drop was likely contributed to by lack of adequate BiV pacing, patient to undergo generator change and to implant a new CS or left bundle branch area pacing lead. Today she reports that she has had increased fatigue in recent weeks, denies any shortness of breath, lower extremity edema, orthopnea or PND.  She reports that she has been tolerating metoprolol  well.  Escalation of GDMT currently limited by blood pressure.  Patient to follow-up with Dr. Kennyth regarding CRT-D generator change.  Encouraged patient to monitor for increased lower extremity edema, shortness of breath, weight gain of 2 to 3 pounds overnight or 5 pounds in a week.  Check CBC and CMET.  CAD: Cardiac catheterization in 2013 indicated mild nonobstructive CAD. Stable with no anginal symptoms. No indication for ischemic evaluation.  Heart healthy diet and regular cardiovascular exercise encouraged.  Continue aspirin  81 mg daily and Lipitor 20 mg daily.  HTN: Blood pressure today 106/72.  Continue metoprolol  succinate 25 mg nightly.  Hyperlipidemia: Last lipid profile on 11/09/2023 indicated total cholesterol 242, HDL 51, triglycerides 84 and LDL 176.  Patient was restarted on atorvastatin .  Check fasting lipid profile and CMET.   Disposition: F/u with Brenlee Koskela, NP in 8 weeks.   Signed, Carolos Fecher D Conner Neiss, NP

## 2024-01-19 ENCOUNTER — Ambulatory Visit: Attending: Cardiology | Admitting: Cardiology

## 2024-01-19 ENCOUNTER — Encounter: Payer: Self-pay | Admitting: Cardiology

## 2024-01-19 VITALS — BP 106/72 | HR 82 | Ht 63.0 in | Wt 191.8 lb

## 2024-01-19 DIAGNOSIS — I5022 Chronic systolic (congestive) heart failure: Secondary | ICD-10-CM

## 2024-01-19 DIAGNOSIS — I428 Other cardiomyopathies: Secondary | ICD-10-CM

## 2024-01-19 DIAGNOSIS — E782 Mixed hyperlipidemia: Secondary | ICD-10-CM | POA: Diagnosis not present

## 2024-01-19 DIAGNOSIS — I10 Essential (primary) hypertension: Secondary | ICD-10-CM | POA: Diagnosis not present

## 2024-01-19 DIAGNOSIS — T82110D Breakdown (mechanical) of cardiac electrode, subsequent encounter: Secondary | ICD-10-CM

## 2024-01-19 DIAGNOSIS — T82110A Breakdown (mechanical) of cardiac electrode, initial encounter: Secondary | ICD-10-CM | POA: Diagnosis not present

## 2024-01-19 DIAGNOSIS — Z95810 Presence of automatic (implantable) cardiac defibrillator: Secondary | ICD-10-CM

## 2024-01-19 DIAGNOSIS — I447 Left bundle-branch block, unspecified: Secondary | ICD-10-CM

## 2024-01-19 LAB — LIPID PANEL

## 2024-01-19 NOTE — Patient Instructions (Signed)
 Medication Instructions:  No changes *If you need a refill on your cardiac medications before your next appointment, please call your pharmacy*  Lab Work: Today we are going to draw a CBC, Cmet, Lipid panel If you have labs (blood work) drawn today and your tests are completely normal, you will receive your results only by: MyChart Message (if you have MyChart) OR A paper copy in the mail If you have any lab test that is abnormal or we need to change your treatment, we will call you to review the results.  Testing/Procedures: No testing  Follow-Up: At [facility], you and your health needs are our priority.  As part of our continuing mission to provide you with exceptional heart care, our providers are all part of one team.  This team includes your primary Cardiologist (physician) and Advanced Practice Providers or APPs (Physician Assistants and Nurse Practitioners) who all work together to provide you with the care you need, when you need it.  Your next appointment:   6-8 week(s)  Provider:   Katlyn West, NP   We recommend signing up for the patient portal called MyChart.  Sign up information is provided on this After Visit Summary.  MyChart is used to connect with patients for Virtual Visits (Telemedicine).  Patients are able to view lab/test results, encounter notes, upcoming appointments, etc.  Non-urgent messages can be sent to your provider as well.   To learn more about what you can do with MyChart, go to [email or web address].

## 2024-01-20 ENCOUNTER — Encounter: Payer: Self-pay | Admitting: Cardiology

## 2024-01-20 ENCOUNTER — Ambulatory Visit: Payer: Self-pay | Admitting: Cardiology

## 2024-01-20 DIAGNOSIS — E782 Mixed hyperlipidemia: Secondary | ICD-10-CM

## 2024-01-20 LAB — LIPID PANEL
Cholesterol, Total: 179 mg/dL (ref 100–199)
HDL: 43 mg/dL (ref >39)
LDL CALC COMMENT:: 4.2 ratio (ref 0.0–4.4)
LDL Chol Calc (NIH): 113 mg/dL — AB (ref 0–99)
Triglycerides: 126 mg/dL (ref 0–149)
VLDL Cholesterol Cal: 23 mg/dL (ref 5–40)

## 2024-01-20 LAB — COMPREHENSIVE METABOLIC PANEL WITH GFR
ALT: 12 IU/L (ref 0–32)
AST: 19 IU/L (ref 0–40)
Albumin: 4.5 g/dL (ref 3.8–4.8)
Alkaline Phosphatase: 95 IU/L (ref 44–121)
BUN/Creatinine Ratio: 15 (ref 12–28)
BUN: 12 mg/dL (ref 8–27)
Bilirubin Total: 0.8 mg/dL (ref 0.0–1.2)
CO2: 26 mmol/L (ref 20–29)
Calcium: 10.1 mg/dL (ref 8.7–10.3)
Chloride: 104 mmol/L (ref 96–106)
Creatinine, Ser: 0.81 mg/dL (ref 0.57–1.00)
Globulin, Total: 2.7 g/dL (ref 1.5–4.5)
Glucose: 90 mg/dL (ref 70–99)
Potassium: 4.7 mmol/L (ref 3.5–5.2)
Sodium: 144 mmol/L (ref 134–144)
Total Protein: 7.2 g/dL (ref 6.0–8.5)
eGFR: 77 mL/min/1.73 (ref >59)

## 2024-01-20 LAB — CBC
Hematocrit: 40 % (ref 34.0–46.6)
Hemoglobin: 13.2 g/dL (ref 11.1–15.9)
MCH: 31.4 pg (ref 26.6–33.0)
MCHC: 33 g/dL (ref 31.5–35.7)
MCV: 95 fL (ref 79–97)
Platelets: 286 x10E3/uL (ref 150–450)
RBC: 4.2 x10E6/uL (ref 3.77–5.28)
RDW: 12.9 % (ref 11.7–15.4)
WBC: 5.8 x10E3/uL (ref 3.4–10.8)

## 2024-01-23 ENCOUNTER — Telehealth: Payer: Self-pay | Admitting: Cardiology

## 2024-01-23 NOTE — Telephone Encounter (Signed)
 Pt was going to pick up some papers today on 5th floor but is unable to stop by until tomorrow morning.

## 2024-01-23 NOTE — Telephone Encounter (Signed)
 Patient returned call regarding getting her procedure scheduled.

## 2024-01-23 NOTE — Telephone Encounter (Signed)
 Patient has been provided dates for her procedure. She will speak with her son and call back.

## 2024-01-23 NOTE — Telephone Encounter (Signed)
 Spoke with the patient and scheduled her procedure with Dr. Kennyth. She will come by the office and pick up soap and instruction letter.

## 2024-01-26 NOTE — Telephone Encounter (Signed)
-----   Message from Katlyn D West sent at 01/20/2024 12:44 PM EDT ----- Please let Ms. Bramblett know that her CBC shows no evidence of anemia or infection.  Her kidney function and electrolytes are normal.  Her liver function is normal.  Her LDL or bad cholesterol  remains above goal, she is currently at 113.  Recommend increasing her atorvastatin  to 40 mg daily, she will need a recheck of her fasting lipid profile and LFTs in 8 weeks.  Thank you. ----- Message ----- From: Rebecka Memos Lab Results In Sent: 01/19/2024  11:36 PM EDT To: Katlyn D West, NP

## 2024-01-26 NOTE — Telephone Encounter (Signed)
 Left message to call back.

## 2024-02-03 ENCOUNTER — Telehealth: Payer: Self-pay | Admitting: Cardiology

## 2024-02-03 NOTE — Telephone Encounter (Signed)
 Pt said she was talking to a nurse and pt was driving so the nurse told her she would call her back at 4 but she has not heard from anyone. Please advise

## 2024-02-03 NOTE — Telephone Encounter (Signed)
 voicemail full- sent sms message with call back number

## 2024-02-05 ENCOUNTER — Ambulatory Visit: Payer: Self-pay | Admitting: Cardiology

## 2024-02-06 MED ORDER — ATORVASTATIN CALCIUM 40 MG PO TABS: 40.0000 mg | ORAL_TABLET | Freq: Every day | ORAL | 3 refills | Status: AC

## 2024-02-09 ENCOUNTER — Encounter

## 2024-02-09 LAB — CUP PACEART REMOTE DEVICE CHECK
Battery Remaining Longevity: INVALID
Battery Voltage: 2.76 V
Brady Statistic RV Percent Paced: 97.51 %
Date Time Interrogation Session: 20250910232127
HighPow Impedance: 93 Ohm
Implantable Lead Connection Status: 753985
Implantable Lead Connection Status: 753985
Implantable Lead Connection Status: 753985
Implantable Lead Implant Date: 20131211
Implantable Lead Implant Date: 20131211
Implantable Lead Implant Date: 20131211
Implantable Lead Location: 753858
Implantable Lead Location: 753859
Implantable Lead Location: 753860
Implantable Lead Model: 181
Implantable Lead Model: 4396
Implantable Lead Model: 5076
Implantable Lead Serial Number: 32342
Implantable Pulse Generator Implant Date: 20200724
Lead Channel Impedance Value: 323 Ohm
Lead Channel Impedance Value: 456 Ohm
Lead Channel Impedance Value: 475 Ohm
Lead Channel Impedance Value: 494 Ohm
Lead Channel Impedance Value: 532 Ohm
Lead Channel Impedance Value: 855 Ohm
Lead Channel Pacing Threshold Amplitude: 0.75 V
Lead Channel Pacing Threshold Amplitude: 0.75 V
Lead Channel Pacing Threshold Amplitude: 2.375 V
Lead Channel Pacing Threshold Pulse Width: 0.4 ms
Lead Channel Pacing Threshold Pulse Width: 0.4 ms
Lead Channel Pacing Threshold Pulse Width: 1.5 ms
Lead Channel Sensing Intrinsic Amplitude: 10 mV
Lead Channel Sensing Intrinsic Amplitude: 2.4 mV
Lead Channel Setting Pacing Amplitude: 1.5 V
Lead Channel Setting Pacing Amplitude: 2.5 V
Lead Channel Setting Pacing Amplitude: 3.5 V
Lead Channel Setting Pacing Pulse Width: 0.4 ms
Lead Channel Setting Pacing Pulse Width: 1.5 ms
Lead Channel Setting Sensing Sensitivity: 0.45 mV
Zone Setting Status: 755011
Zone Setting Status: 755011

## 2024-02-10 DIAGNOSIS — E782 Mixed hyperlipidemia: Secondary | ICD-10-CM | POA: Diagnosis not present

## 2024-02-11 LAB — HEPATIC FUNCTION PANEL
ALT: 7 IU/L (ref 0–32)
AST: 14 IU/L (ref 0–40)
Albumin: 4.2 g/dL (ref 3.8–4.8)
Alkaline Phosphatase: 92 IU/L (ref 44–121)
Bilirubin Total: 0.8 mg/dL (ref 0.0–1.2)
Bilirubin, Direct: 0.25 mg/dL (ref 0.00–0.40)
Total Protein: 6.9 g/dL (ref 6.0–8.5)

## 2024-02-11 LAB — LIPID PANEL
Chol/HDL Ratio: 3.5 ratio (ref 0.0–4.4)
Cholesterol, Total: 152 mg/dL (ref 100–199)
HDL: 44 mg/dL (ref >39)
LDL Chol Calc (NIH): 92 mg/dL (ref 0–99)
Triglycerides: 81 mg/dL (ref 0–149)
VLDL Cholesterol Cal: 16 mg/dL (ref 5–40)

## 2024-02-14 NOTE — Pre-Procedure Instructions (Signed)
 Instructed patient on the following items: Arrival time 2:00  No meds AM of procedure Responsible person to drive you home and stay with you for 24 hrs Wash with special soap night before and morning of procedure

## 2024-02-15 ENCOUNTER — Encounter (HOSPITAL_COMMUNITY)
Admission: RE | Disposition: A | Discharge status: Home / Self Care | Payer: Self-pay | Source: Home / Self Care | Attending: Cardiology

## 2024-02-15 ENCOUNTER — Ambulatory Visit (HOSPITAL_COMMUNITY)

## 2024-02-15 ENCOUNTER — Observation Stay (HOSPITAL_COMMUNITY)
Admission: RE | Admit: 2024-02-15 | Discharge: 2024-02-16 | Disposition: A | Discharge status: Home / Self Care | Attending: Cardiology | Admitting: Cardiology

## 2024-02-15 ENCOUNTER — Other Ambulatory Visit: Payer: Self-pay

## 2024-02-15 DIAGNOSIS — I5022 Chronic systolic (congestive) heart failure: Secondary | ICD-10-CM | POA: Diagnosis not present

## 2024-02-15 DIAGNOSIS — T82110A Breakdown (mechanical) of cardiac electrode, initial encounter: Secondary | ICD-10-CM | POA: Diagnosis not present

## 2024-02-15 DIAGNOSIS — I110 Hypertensive heart disease with heart failure: Secondary | ICD-10-CM

## 2024-02-15 DIAGNOSIS — I428 Other cardiomyopathies: Secondary | ICD-10-CM | POA: Diagnosis not present

## 2024-02-15 DIAGNOSIS — Z4502 Encounter for adjustment and management of automatic implantable cardiac defibrillator: Secondary | ICD-10-CM | POA: Diagnosis not present

## 2024-02-15 DIAGNOSIS — I871 Compression of vein: Secondary | ICD-10-CM | POA: Insufficient documentation

## 2024-02-15 DIAGNOSIS — R7303 Prediabetes: Secondary | ICD-10-CM | POA: Diagnosis not present

## 2024-02-15 DIAGNOSIS — I2510 Atherosclerotic heart disease of native coronary artery without angina pectoris: Secondary | ICD-10-CM | POA: Diagnosis not present

## 2024-02-15 DIAGNOSIS — I447 Left bundle-branch block, unspecified: Secondary | ICD-10-CM | POA: Insufficient documentation

## 2024-02-15 HISTORY — PX: LEAD INSERTION: EP1212

## 2024-02-15 HISTORY — PX: BIV ICD GENERATOR CHANGEOUT: EP1194

## 2024-02-15 SURGERY — BIV ICD GENERATOR CHANGEOUT
Anesthesia: General

## 2024-02-15 MED ORDER — ATORVASTATIN CALCIUM 40 MG PO TABS
40.0000 mg | ORAL_TABLET | Freq: Every day | ORAL | Status: DC
  Administered 2024-02-16: 40 mg via ORAL
  Filled 2024-02-15: qty 1

## 2024-02-15 MED ORDER — CHLORHEXIDINE GLUCONATE 4 % EX SOLN
4.0000 | Freq: Once | CUTANEOUS | Status: DC
  Filled 2024-02-15: qty 60

## 2024-02-15 MED ORDER — ONDANSETRON HCL 4 MG/2ML IJ SOLN: 4.0000 mg | Freq: Once | INTRAMUSCULAR | Status: DC | PRN

## 2024-02-15 MED ORDER — FENTANYL CITRATE (PF) 100 MCG/2ML IJ SOLN
INTRAMUSCULAR | Status: AC
  Filled 2024-02-15: qty 2

## 2024-02-15 MED ORDER — ACETAMINOPHEN 325 MG PO TABS
325.0000 mg | ORAL_TABLET | ORAL | Status: DC | PRN
  Administered 2024-02-15: 650 mg via ORAL

## 2024-02-15 MED ORDER — FENTANYL CITRATE (PF) 100 MCG/2ML IJ SOLN
INTRAMUSCULAR | Status: DC | PRN
  Administered 2024-02-15 (×2): 25 ug via INTRAVENOUS
  Administered 2024-02-15: 50 ug via INTRAVENOUS

## 2024-02-15 MED ORDER — FENTANYL CITRATE (PF) 100 MCG/2ML IJ SOLN
25.0000 ug | INTRAMUSCULAR | Status: DC | PRN
  Administered 2024-02-15: 25 ug via INTRAVENOUS

## 2024-02-15 MED ORDER — SODIUM CHLORIDE 0.9 % IV SOLN: INTRAVENOUS | Status: DC

## 2024-02-15 MED ORDER — IOHEXOL 350 MG/ML SOLN
INTRAVENOUS | Status: DC | PRN
  Administered 2024-02-15: 15 mL

## 2024-02-15 MED ORDER — CEFAZOLIN SODIUM-DEXTROSE 2-4 GM/100ML-% IV SOLN
2.0000 g | INTRAVENOUS | Status: AC
  Administered 2024-02-15: 2 g via INTRAVENOUS

## 2024-02-15 MED ORDER — ACETAMINOPHEN 500 MG PO TABS
1000.0000 mg | ORAL_TABLET | Freq: Once | ORAL | Status: AC
Start: 2024-02-15 — End: 2024-02-15
  Administered 2024-02-15: 1000 mg via ORAL
  Filled 2024-02-15: qty 2

## 2024-02-15 MED ORDER — OXYCODONE HCL 5 MG PO TABS: 5.0000 mg | ORAL_TABLET | Freq: Once | ORAL | Status: DC | PRN

## 2024-02-15 MED ORDER — OXYCODONE HCL 5 MG/5ML PO SOLN
5.0000 mg | Freq: Once | ORAL | Status: DC | PRN
  Filled 2024-02-15: qty 5

## 2024-02-15 MED ORDER — MIDAZOLAM HCL 2 MG/2ML IJ SOLN
INTRAMUSCULAR | Status: AC
  Filled 2024-02-15: qty 2

## 2024-02-15 MED ORDER — LIDOCAINE HCL (PF) 1 % IJ SOLN
INTRAMUSCULAR | Status: DC | PRN
  Administered 2024-02-15: 60 mL

## 2024-02-15 MED ORDER — SODIUM CHLORIDE 0.9 % IV SOLN
INTRAVENOUS | Status: AC
  Administered 2024-02-15: 80 mg
  Filled 2024-02-15: qty 2

## 2024-02-15 MED ORDER — CEFAZOLIN SODIUM-DEXTROSE 2-4 GM/100ML-% IV SOLN
INTRAVENOUS | Status: AC
  Filled 2024-02-15: qty 100

## 2024-02-15 MED ORDER — VASOPRESSIN 20 UNIT/ML IV SOLN
INTRAVENOUS | Status: DC | PRN
Start: 2024-02-15 — End: 2024-02-15
  Administered 2024-02-15: 1 [IU] via INTRAVENOUS
  Administered 2024-02-15: .5 [IU] via INTRAVENOUS

## 2024-02-15 MED ORDER — PROPOFOL 500 MG/50ML IV EMUL
INTRAVENOUS | Status: DC | PRN
  Administered 2024-02-15: 25 ug/kg/min via INTRAVENOUS

## 2024-02-15 MED ORDER — ACETAMINOPHEN 325 MG PO TABS
ORAL_TABLET | ORAL | Status: AC
  Filled 2024-02-15: qty 2

## 2024-02-15 MED ORDER — SODIUM CHLORIDE 0.9 % IV SOLN: 80.0000 mg | INTRAVENOUS | Status: AC

## 2024-02-15 MED ORDER — ALBUMIN HUMAN 5 % IV SOLN: INTRAVENOUS | Status: DC | PRN

## 2024-02-15 MED ORDER — PHENYLEPHRINE HCL-NACL 20-0.9 MG/250ML-% IV SOLN
INTRAVENOUS | Status: DC | PRN
  Administered 2024-02-15: 20 ug/min via INTRAVENOUS

## 2024-02-15 MED ORDER — METOPROLOL SUCCINATE ER 25 MG PO TB24
25.0000 mg | ORAL_TABLET | Freq: Every day | ORAL | Status: DC
  Administered 2024-02-16: 25 mg via ORAL
  Filled 2024-02-15 (×2): qty 1

## 2024-02-15 MED ORDER — ONDANSETRON HCL 4 MG/2ML IJ SOLN: 4.0000 mg | Freq: Four times a day (QID) | INTRAMUSCULAR | Status: DC | PRN

## 2024-02-15 MED ORDER — HEPARIN (PORCINE) IN NACL 1000-0.9 UT/500ML-% IV SOLN
INTRAVENOUS | Status: DC | PRN
  Administered 2024-02-15: 500 mL

## 2024-02-15 MED ORDER — MIDAZOLAM HCL 2 MG/2ML IJ SOLN
INTRAMUSCULAR | Status: DC | PRN
  Administered 2024-02-15 (×2): 1 mg via INTRAVENOUS

## 2024-02-15 SURGICAL SUPPLY — 16 items
CABLE SURGICAL S-101-97-12 (CABLE) ×1 IMPLANT
CATH RIGHTSITE C315HIS02 (CATHETERS) IMPLANT
ICD COBALT XT CRT DTPA2D1 (ICD Generator) IMPLANT
KIT LEAD END CAP (Cap) IMPLANT
LEAD SELECT SECURE 3830 383069 (Lead) IMPLANT
PAD DEFIB RADIO PHYSIO CONN (PAD) ×1 IMPLANT
POUCH AIGIS-R ANTIBACT ICD LRG (Mesh General) IMPLANT
SHEATH 7FR PRELUDE SNAP 13 (SHEATH) IMPLANT
SHEATH 7FR PRELUDE SNAP 25 (SHEATH) IMPLANT
SHEATH PROBE COVER 6X72 (BAG) IMPLANT
SLITTER 6232ADJ (MISCELLANEOUS) IMPLANT
TRAY PACEMAKER INSERTION (PACKS) ×1 IMPLANT
WIRE AMPLATZ ST .035X145CM (WIRE) IMPLANT
WIRE G STIF.035X180 STR (WIRE) IMPLANT
WIRE HI TORQ VERSACORE-J 145CM (WIRE) IMPLANT
WIRE MICRO SET SILHO 5FR 7 (SHEATH) IMPLANT

## 2024-02-15 NOTE — H&P (Signed)
 Electrophysiology Note:   Date:  02/15/24  ID:  Cynthia Roth, DOB 27-Jun-1950, MRN [id number]   Primary Cardiologist: Annabella Scarce, MD Electrophysiologist: Fonda Kitty, MD       History of Present Illness:   Cynthia Roth is a 73 y.o. female with h/o chronic systolic heart failure secondary to nonischemic cardiomyopathy and left bundle branch block status post CRT-D, hypertension who is being seen today for CRT-D follow-up.   Discussed the use of AI scribe software for clinical note transcription with the patient, who gave verbal consent to proceed.   History of Present Illness Cynthia Roth is a 73 year old female with heart failure and a pacemaker who presents with concerns about her pacemaker function and heart function.   She has a history of heart failure for which a CRT-D  was implanted years ago due to low heart pumping function. Initially, the device improved her heart function, and she did well for a period of time. She feels 'okay' but not fine, and denies feeling worse over the past six months compared to a year ago. She experiences swelling in her legs, particularly in the right leg, which is not new and does not appear to be worsening. She applies a topical treatment to alleviate the pain associated with the swelling. No issues with breathing, including shortness of breath or difficulty breathing while lying down.  No new or acute complaints today.   Interval: Patient presents today for gen change and LV lead implant. Reports feeling relatively well. No new or acute complaints.   Review of systems complete and found to be negative unless listed in HPI.    EP Information / Studies Reviewed:      EKG Interpretation Date/Time:                  Monday January 09 2024 12:00:45 EDT Ventricular Rate:         88 PR Interval:                   QRS Duration:             150 QT Interval:                 456 QTC Calculation:551 R Axis:                          123   Text Interpretation:      Bi Ventricular-paced rhythm When compared with ECG of 09-Jan-2024 10:55, Vent. rate has increased BY   4 BPM Confirmed by Kitty Fonda [phone]) on 01/12/2024 10:49:47 AM    Echo 11/2023:  1. Left ventricular ejection fraction, by estimation, is 20 to 25%. Left  ventricular ejection fraction by 3D volume is 22 %. Left ventricular  ejection fraction by 2D MOD biplane is 24.0 %. The left ventricle has  severely decreased function. The left  ventricle demonstrates global hypokinesis with septal-lateral dyssynchrony  suggestive of LBBB. The left ventricular internal cavity size was mildly  dilated. Left ventricular diastolic parameters are consistent with Grade I  diastolic dysfunction (impaired   relaxation).   2. Right ventricular systolic function is mildly reduced. The right  ventricular size is normal. Tricuspid regurgitation signal is inadequate  for assessing PA pressure.   3. Left atrial size was mild to moderately dilated.   4. The mitral valve is normal in structure. Mild mitral valve  regurgitation. No evidence of mitral stenosis.  5. The aortic valve is tricuspid. Aortic valve regurgitation is not  visualized. No aortic stenosis is present.   6. The inferior vena cava is normal in size with greater than 50%  respiratory variability, suggesting right atrial pressure of 3 mmHg.        Physical Exam:    Today's Vitals   02/15/24 1419 02/15/24 1424  BP: 122/60   Pulse: 87   Resp: 17   Temp: 98.1 F (36.7 C)   TempSrc: Oral   SpO2: 100%   Weight: 86.6 kg   Height: 5' 3 (1.6 m)   PainSc:  4    Body mass index is 33.83 kg/m.  GEN: Well nourished, well developed in no acute distress NECK: No JVD CARDIAC: Normal rate, regular rhythm.  Well-healed left chest ICD pocket. RESPIRATORY:  Clear to auscultation without rales, wheezing or rhonchi  ABDOMEN: Soft, non-distended EXTREMITIES:  No edema; No deformity    ASSESSMENT AND  PLAN:     #Device at ERI: #S/p CRT-D: 12-lead ECG is consistent with RV apical pacing.  This is different from her prior BiV paced ECGs. This is likely due to lack of capture from CS lead; she has chronically elevated capture thresholds. Of note, EF appears to have declined in the setting which I suspect is due to lack of adequate BiV pacing. #Pacemaker lead malfunction: - Patient has chronically elevated CS lead capture threshold.  We tested multiple vectors and there is no way to program around this, albeit this is only a bipolar lead.  We did see more appropriate biventricular pacing by maximally pre-exciting the LV lead and increasing the output.   - She has now reached ERI. I believe the best option would be implanting a new CS or left bundle branch area pacing lead, most likely the latter, at the time of generator change.  This would require either patency of the left axillary vein, which I am doubtful of given the age of her device, or tunneling from the right chest.  Patient and her son are not interested in lead extraction as their first choice given the higher operative risk. They have opted to abandon her old CS lead.  Risks, benefits, and alternatives to ICD pulse generator replacement and LV lead implantation were discussed in detail today.  The patient and son understand these risks and wish to proceed today.  #Chronic systolic heart failure: LVEF initially recovered after CRT implant.  Most recent echo shows drop in LVEF to 20-25%.  I suspect this is due to lack of effective BiV pacing.  Appears reasonably compensated on exam.  She does have chronic lower extremity edema. #Non-ischemic cardiomyopathy:  #LBBB: -Continue close follow-up with general cardiology. -Continue metoprolol  and spironolactone .   Follow up with Dr. Kennyth in 3 months.    Signed, Fonda Kennyth, MD

## 2024-02-15 NOTE — Progress Notes (Signed)
 Patient to PACU bay 13 from cath lab pending inpatient room assignment.  VSS.  Patient oriented to PACU, all questions answered. Son Wadie at bedside.

## 2024-02-15 NOTE — Progress Notes (Signed)
 At time of discharge call patients brother Cynthia Roth listed in emergency contacts who will be taking her home.

## 2024-02-15 NOTE — Telephone Encounter (Signed)
 Does not appear pt ever called but however did had ICD generator change out today (9/17)  will close this encounter and await a call back if she has further needs/questions.

## 2024-02-15 NOTE — Transfer of Care (Signed)
 Immediate Anesthesia Transfer of Care Note  Patient: Cynthia Roth  Procedure(s) Performed: BIV ICD GENERATOR CHANGEOUT LEAD INSERTION  Patient Location: Cath Lab  Anesthesia Type:MAC  Level of Consciousness: awake, alert , and patient cooperative  Airway & Oxygen Therapy: Patient Spontanous Breathing  Post-op Assessment: Report given to RN, Post -op Vital signs reviewed and stable, Patient moving all extremities, and Patient moving all extremities X 4  Post vital signs: Reviewed and stable  Last Vitals:  Vitals Value Taken Time  BP 101/60 02/15/24 19:34  Temp    Pulse 93 02/15/24 19:40  Resp 14 02/15/24 19:40  SpO2 100 % 02/15/24 19:40  Vitals shown include unfiled device data.  Last Pain:  Vitals:   02/15/24 1653  TempSrc:   PainSc: Asleep         Complications: There were no known notable events for this encounter.

## 2024-02-15 NOTE — Anesthesia Postprocedure Evaluation (Signed)
 Anesthesia Post Note  Patient: Cynthia Roth  Procedure(s) Performed: BIV ICD GENERATOR CHANGEOUT LEAD INSERTION     Patient location during evaluation: PACU Anesthesia Type: General Level of consciousness: awake and alert Pain management: pain level controlled Vital Signs Assessment: post-procedure vital signs reviewed and stable Respiratory status: spontaneous breathing, nonlabored ventilation, respiratory function stable and patient connected to nasal cannula oxygen Cardiovascular status: blood pressure returned to baseline and stable Postop Assessment: no apparent nausea or vomiting Anesthetic complications: no   There were no known notable events for this encounter.  Last Vitals:  Vitals:   02/15/24 2115 02/15/24 2130  BP: 113/64 104/63  Pulse: 88 88  Resp: 18 17  Temp:    SpO2: 99% 98%    Last Pain:  Vitals:   02/15/24 2130  TempSrc:   PainSc: Asleep                 Lynwood MARLA Cornea

## 2024-02-15 NOTE — Anesthesia Preprocedure Evaluation (Addendum)
 Anesthesia Evaluation  Patient identified by MRN, date of birth, ID band Patient awake    Reviewed: Allergy & Precautions, NPO status , Patient's Chart, lab work & pertinent test results, reviewed documented beta blocker date and time   History of Anesthesia Complications Negative for: history of anesthetic complications  Airway Mallampati: II  TM Distance: >3 FB Neck ROM: Full    Dental  (+) Dental Advisory Given, Missing   Pulmonary neg pulmonary ROS   Pulmonary exam normal        Cardiovascular hypertension, Pt. on home beta blockers and Pt. on medications + CAD and +CHF  Normal cardiovascular exam+ pacemaker + Cardiac Defibrillator    '25 TTE - EF 20 to 25%. Global hypokinesis with septal-lateral dyssynchrony suggestive of LBBB. The left ventricular internal cavity size was mildly dilated. Grade I  diastolic dysfunction (impaired  relaxation). Right ventricular systolic function is mildly reduced. Left atrial size was mild to moderately dilated. Mild mitral valve regurgitation.     Neuro/Psych negative neurological ROS  negative psych ROS   GI/Hepatic Neg liver ROS,GERD  ,,  Endo/Other   Obesity Pre-DM   Renal/GU negative Renal ROS     Musculoskeletal negative musculoskeletal ROS (+)    Abdominal   Peds  Hematology negative hematology ROS (+)   Anesthesia Other Findings   Reproductive/Obstetrics                              Anesthesia Physical Anesthesia Plan  ASA: 4  Anesthesia Plan: MAC   Post-op Pain Management: Tylenol  PO (pre-op)*   Induction: Intravenous  PONV Risk Score and Plan: 3 and Propofol  infusion, Treatment may vary due to age or medical condition and Ondansetron   Airway Management Planned: Natural Airway and Simple Face Mask  Additional Equipment:   Intra-op Plan:   Post-operative Plan:   Informed Consent: I have reviewed the patients History and  Physical, chart, labs and discussed the procedure including the risks, benefits and alternatives for the proposed anesthesia with the patient or authorized representative who has indicated his/her understanding and acceptance.     Dental advisory given  Plan Discussed with: CRNA, Anesthesiologist and Surgeon  Anesthesia Plan Comments: (Discussed GETA as backup plan if necessary)         Anesthesia Quick Evaluation

## 2024-02-16 ENCOUNTER — Encounter (HOSPITAL_COMMUNITY): Payer: Self-pay | Admitting: Cardiology

## 2024-02-16 ENCOUNTER — Ambulatory Visit (HOSPITAL_COMMUNITY)

## 2024-02-16 DIAGNOSIS — I2510 Atherosclerotic heart disease of native coronary artery without angina pectoris: Secondary | ICD-10-CM | POA: Diagnosis not present

## 2024-02-16 DIAGNOSIS — I110 Hypertensive heart disease with heart failure: Secondary | ICD-10-CM | POA: Diagnosis not present

## 2024-02-16 DIAGNOSIS — I5022 Chronic systolic (congestive) heart failure: Secondary | ICD-10-CM | POA: Diagnosis not present

## 2024-02-16 DIAGNOSIS — Z4502 Encounter for adjustment and management of automatic implantable cardiac defibrillator: Secondary | ICD-10-CM | POA: Diagnosis not present

## 2024-02-16 DIAGNOSIS — I428 Other cardiomyopathies: Secondary | ICD-10-CM | POA: Diagnosis not present

## 2024-02-16 DIAGNOSIS — I700 Atherosclerosis of aorta: Secondary | ICD-10-CM | POA: Diagnosis not present

## 2024-02-16 DIAGNOSIS — I871 Compression of vein: Secondary | ICD-10-CM | POA: Diagnosis not present

## 2024-02-16 DIAGNOSIS — R7303 Prediabetes: Secondary | ICD-10-CM | POA: Diagnosis not present

## 2024-02-16 DIAGNOSIS — I447 Left bundle-branch block, unspecified: Secondary | ICD-10-CM | POA: Diagnosis not present

## 2024-02-16 DIAGNOSIS — Z950 Presence of cardiac pacemaker: Secondary | ICD-10-CM | POA: Diagnosis not present

## 2024-02-16 MED ORDER — CEFAZOLIN SODIUM-DEXTROSE 1-4 GM/50ML-% IV SOLN
1.0000 g | Freq: Four times a day (QID) | INTRAVENOUS | Status: AC
  Administered 2024-02-16 (×3): 1 g via INTRAVENOUS
  Filled 2024-02-16 (×3): qty 50

## 2024-02-16 NOTE — Progress Notes (Signed)
 Pts BP 103/66 MAP 78. Asymptomatic. Metoprolol  held. PA paged. Awaiting response.

## 2024-02-16 NOTE — Plan of Care (Signed)
  Problem: Education: Goal: Knowledge of cardiac device and self-care will improve Outcome: Progressing Goal: Ability to safely manage health related needs after discharge will improve Outcome: Progressing Goal: Individualized Educational Video(s) Outcome: Progressing   Problem: Education: Goal: Knowledge of General Education information will improve Description: Including pain rating scale, medication(s)/side effects and non-pharmacologic comfort measures Outcome: Progressing

## 2024-02-16 NOTE — Discharge Summary (Addendum)
 ELECTROPHYSIOLOGY PROCEDURE DISCHARGE SUMMARY    Patient ID: Cynthia Roth,  MRN: [id number], DOB/AGE: [id number] y.o.  Admit date: 02/15/2024 Discharge date: 02/16/2024  Primary Care Physician: Mercer Clotilda SAUNDERS, MD  Primary Cardiologist: Dr. Raford Electrophysiologist: Dr. Fernande (inactive) > Dr. kennyth  Primary Discharge Diagnosis:  CRT-D Generator depletion CS lead failure  Secondary Discharge Diagnosis:  HTN NICM BP has limited GDMT LBBB LVH  Allergies  Allergen Reactions   Decongestant [Pseudoephedrine Hcl Er]     Unknown reaction   Pseudoephedrine Other (See Comments)     Procedures This Admission:  1.  Implantation of a LB area pacing lead, generator change CRT-D on 02/16/24 by Dr kennyth.   There were no immediate post procedure complications. 2.  CXR on 02/16/24 demonstrated no pneumothorax status post device implantation.   Brief HPI: Cynthia Roth is a 73 y.o. female was referred to electrophysiology in the outpatient setting for consideration of ICD implantation.  Past medical history includes HFrEF due to NICM s/p CRT-D, LBBB, HTN, HLD pre-DM and gout.  The patient has persistent LV dysfunction despite guideline directed therapy.  Risks, benefits, and alternatives to ICD implantation were reviewed with the patient who wished to proceed.   Hospital Course:  The patient was admitted and underwent generator change and implantation of a new LBBA pacing lead with details as outlined in the procedure report (existing RA lead unchanged, RV ICD coil component connected to new generator, RV pace/sense capped). She was monitored on telemetry overnight which demonstrated appropriate VP 80-110's.  Left chest was without hematoma or ecchymosis - small area of dried blood under steri strips at discharge.  The device was interrogated and found to be functioning normally.  CXR was obtained and demonstrated no pneumothorax status post device  implantation.  Wound care, arm mobility, and restrictions were reviewed with the patient.  The patient feels well,  denies any CP or SOB, minimal site discomfort,  she was examined by Dr. kennyth and considered stable for discharge to home.   The patient's discharge medications include an beta blocker (Toprol ), lasix .  GDMT limited by hypotension.   Physical Exam: Vitals:   02/15/24 2315 02/15/24 2350 02/16/24 0806 02/16/24 1221  BP: 99/64 (!) 84/59 103/66 122/79  Pulse: 92 86 95 100  Resp: (!) 25 20 20 20   Temp: 97.6 F (36.4 C) 97.9 F (36.6 C) 98.3 F (36.8 C) 98.3 F (36.8 C)  TempSrc:  Oral Oral Oral  SpO2: 97% 100% 100%   Weight:      Height:        GEN- pleasant, well appearing, alert and oriented x 3 today.   HEENT: normocephalic, atraumatic; oropharynx clear Lungs- non-labored at rest, CTA b/l.  No wheezes, rales, rhonchi Heart- S1S2 RRR, no murmurs, rubs or gallops  GI- soft, non-tender, non-distended Extremities- no clubbing, cyanosis, or edema MS- no significant deformity or atrophy Skin- warm and dry, no rash or lesion, left chest without hematoma/ecchymosis Psych- euthymic mood, full affect Neuro- no gross defecits  Labs:   Lab Results  Component Value Date   WBC 5.8 01/19/2024   HGB 13.2 01/19/2024   HCT 40.0 01/19/2024   MCV 95 01/19/2024   PLT 286 01/19/2024    Recent Labs  Lab 02/10/24 0831  PROT 6.9  BILITOT 0.8  ALKPHOS 92  ALT 7  AST 14    Discharge Medications:  Allergies as of 02/16/2024       Reactions  Decongestant [pseudoephedrine Hcl Er]    Unknown reaction   Pseudoephedrine Other (See Comments)        Medication List     PAUSE taking these medications    furosemide  20 MG tablet Wait to take this until: February 20, 2024 Commonly known as: LASIX  TAKE 1 TABLET BY MOUTH EVERY OTHER DAY       TAKE these medications    acetaminophen  500 MG tablet Commonly known as: TYLENOL  Take 500 mg by mouth at bedtime as needed  for mild pain (pain score 1-3) or moderate pain (pain score 4-6).   atorvastatin  40 MG tablet Commonly known as: LIPITOR Take 1 tablet (40 mg total) by mouth daily.   fexofenadine  180 MG tablet Commonly known as: ALLEGRA  TAKE 1 TABLET BY MOUTH EVERY DAY   metoprolol  succinate 25 MG 24 hr tablet Commonly known as: TOPROL -XL Take 1 tablet (25 mg total) by mouth daily.        Disposition: Home / Self Care Discharge Instructions     Call MD for:  redness, tenderness, or signs of infection (pain, swelling, redness, odor or green/yellow discharge around incision site)   Complete by: As directed    Call MD for:  severe uncontrolled pain   Complete by: As directed    Call MD for:  temperature >100.4   Complete by: As directed    Diet - low sodium heart healthy   Complete by: As directed    Increase activity slowly   Complete by: As directed        Signed, Daphne Barrack, NP-C, AGACNP-BC [facility] - Electrophysiology  02/16/2024, 2:41 PM  I have seen, examined the patient, and reviewed the above assessment and plan.    Hospital Course: Patient presented on 9/17 for generator change and LBBAP lead implant. Device had reached ERI and CS lead threshold was significantly elevated and rising. She was monitored overnight with no acute complications observed. She reported feeling well on the day of discharge with no new or acute complaints.   General: Well developed, in no acute distress.  Neck: No JVD.  Cardiac: Normal rate, regular rhythm. Left chest ICD pocket without bleeding or hematoma. Resp: Normal work of breathing.  Ext: No edema.  Neuro: No gross focal deficits.  Psych: Normal affect.   Assessment:  CRT-D generator at [facility], now s/p replacement CS pacing lead malfunction, now s/p LBBAP lead implant Chronic systolic heart failure secondary to NICM in setting of LBBB  Plan:  - CXR with appropriate lead position and no pneumothorax - Bedside device  interrogation with appropriate device function and stable lead parameters - Usual post implant teaching regarding activity restrictions and wound care was provided - Continue home metoprolol  XL. Unable to tolerate much more GDMT based on blood pressure. Will refer to HF Clinic to assist with management.   Duration of discharge encounter: 35 minutes  Fonda Kitty, MD 02/16/2024 11:58 PM

## 2024-02-16 NOTE — Progress Notes (Addendum)
 Patient and brother both verbalized understanding of instructions. Vss. Patient ambulated in room several times without difficulty. Denies pain of discomfort. All belongings and paperwork  given to patient.

## 2024-02-16 NOTE — Discharge Instructions (Signed)
 After Your ICD (Implantable Cardiac Defibrillator)   You have a Medtronic ICD  If you have a Medtronic or Biotronik device, plug in your home monitor once you get home, and no manual interaction is required.   If you have an [employer] or [employer] device, plug your home monitor once you get home, sit near the device, and press the large activation button. Sit nearby until the process is complete, usually notated by lights on the monitor.   If you were set up for monitoring using an app on your phone, make sure the app remains open in the background and the Bluetooth remains on.  ACTIVITY Do not lift your arm above shoulder height for 1 week after your procedure. After 7 days, you may progress as below.  You should remove your sling 24 hours after your procedure, unless otherwise instructed by your provider.     Thursday February 23, 2024  Friday February 24, 2024 Saturday February 25, 2024 Sunday February 26, 2024   Do not lift, push, pull, or carry anything over 10 pounds with the affected arm until 6 weeks (Thursday March 29, 2024 ) after your procedure.   You may drive AFTER your wound check, unless you have been told otherwise by your provider.   Ask your healthcare provider when you can go back to work   INCISION/Dressing If you are on a blood thinner such as Coumadin, Xarelto, Eliquis, Plavix, or Pradaxa please confirm with your provider when this should be resumed.   If large square, outer bandage is left in place, this can be removed after 24 hours from your procedure. Do not remove steri-strips or glue as below.   Monitor your defibrillator site for redness, swelling, and drainage. Call the device clinic at [phone] if you experience these symptoms or fever/chills.  If your incision is sealed with Steri-strips or staples, you may shower 7 days after your procedure or when told by your provider. Do not remove the steri-strips or let the shower hit directly on  your site. You may wash around your site with soap and water.    If you were discharged in a sling, please do not wear this during the day more than 48 hours after your surgery unless otherwise instructed. This may increase the risk of stiffness and soreness in your shoulder.   Avoid lotions, ointments, or perfumes over your incision until it is well-healed.  You may use a hot tub or a pool AFTER your wound check appointment if the incision is completely closed.  Your ICD is designed to protect you from life threatening heart rhythms. Because of this, you may receive a shock.   1 shock with no symptoms:  Call the office during business hours. 1 shock with symptoms (chest pain, chest pressure, dizziness, lightheadedness, shortness of breath, overall feeling unwell):  Call 911. If you experience 2 or more shocks in 24 hours:  Call 911. If you receive a shock, you should not drive for 6 months per the [place] DMV IF you receive appropriate therapy from your ICD.   ICD Alerts:  Some alerts are vibratory and others beep. These are NOT emergencies. Please call our office to let us  know. If this occurs at night or on weekends, it can wait until the next business day. Send a remote transmission.  If your device is capable of reading fluid status (for heart failure), you will be offered monthly monitoring to review this with you.   DEVICE MANAGEMENT Remote monitoring  is used to monitor your ICD from home. This monitoring is scheduled every 91 days by our office. It allows us  to keep an eye on the functioning of your device to ensure it is working properly. You will routinely see your Electrophysiologist annually (more often if necessary). This will appear as a REMOTE check on your MyChart schedule. These are automatic and there is nothing for you to manually do unless otherwise instructed.  You should receive your ID card for your new device in 4-8 weeks. Keep this card with you at all times once received.  Consider wearing a medical alert bracelet or necklace.  Your ICD  may be MRI compatible. This will be discussed at your next office visit/wound check.  You should avoid contact with strong electric or magnetic fields.   Do not use amateur (ham) radio equipment or electric (arc) welding torches. MP3 player headphones with magnets should not be used. Some devices are safe to use if held at least 12 inches (30 cm) from your defibrillator. These include power tools, lawn mowers, and speakers. If you are unsure if something is safe to use, ask your health care provider.  When using your cell phone, hold it to the ear that is on the opposite side from the defibrillator. Do not leave your cell phone in a pocket over the defibrillator.  You may safely use electric blankets, heating pads, computers, and microwave ovens.  Call the office right away if: You have chest pain. You feel more than one shock. You feel more short of breath than you have felt before. You feel more light-headed than you have felt before. Your incision starts to open up.  This information is not intended to replace advice given to you by your health care provider. Make sure you discuss any questions you have with your health care provider.

## 2024-02-16 NOTE — Progress Notes (Signed)
 Pt arrived to the floor via the bed, alert and oriented, CHG bath given, pt orient to staff and equipment,VSS, we'll continue to monitor.

## 2024-02-17 ENCOUNTER — Other Ambulatory Visit: Payer: Self-pay | Admitting: Cardiology

## 2024-02-17 DIAGNOSIS — I5022 Chronic systolic (congestive) heart failure: Secondary | ICD-10-CM

## 2024-02-19 ENCOUNTER — Ambulatory Visit: Payer: Self-pay | Admitting: Cardiology

## 2024-02-20 ENCOUNTER — Telehealth: Payer: Self-pay

## 2024-02-20 NOTE — Telephone Encounter (Signed)
 Spoke to patients son Wadie who reports a small amount of drainage at site that was clear in color on the steri-strips. States he called patient this morning and patient reports no drainage at steri-strips but Wadie stated patient is not able to keep a close check and does not have anyone local who is able to do so.   Patient has apt with HF team Thursday 02/23/24 and will see patient in device clinic following HF apt. DC apt made 02/23/24 @ 2:30 PM. Advised if any fever or chills patient need to go to ER. If any yellow, redness of warmth noted at site call to let us  know. Wadie voiced understanding and was appreciative of call.

## 2024-02-22 ENCOUNTER — Telehealth (HOSPITAL_COMMUNITY): Payer: Self-pay | Admitting: Cardiology

## 2024-02-22 NOTE — Telephone Encounter (Signed)
 Called to confirm/remind patient of their appointment at the [facility] on 02/22/24.   Appointment:   [x] Confirmed  [] Left mess   [] No answer/No voice mail  [] VM Full/unable to leave message  [] Phone not in service  Patient reminded to bring all medications and/or complete list.  Confirmed patient has transportation. Gave directions, instructed to utilize valet parking.

## 2024-02-23 ENCOUNTER — Encounter (HOSPITAL_COMMUNITY): Payer: Self-pay | Admitting: Cardiology

## 2024-02-23 ENCOUNTER — Telehealth (HOSPITAL_COMMUNITY): Payer: Self-pay

## 2024-02-23 ENCOUNTER — Ambulatory Visit
Admit: 2024-02-23 | Discharge: 2024-02-23 | Disposition: A | Discharge status: Home / Self Care | Attending: Cardiology | Admitting: Cardiology

## 2024-02-23 ENCOUNTER — Other Ambulatory Visit (HOSPITAL_COMMUNITY): Payer: Self-pay

## 2024-02-23 ENCOUNTER — Ambulatory Visit (INDEPENDENT_AMBULATORY_CARE_PROVIDER_SITE_OTHER)

## 2024-02-23 ENCOUNTER — Ambulatory Visit (HOSPITAL_COMMUNITY): Payer: Self-pay | Admitting: Cardiology

## 2024-02-23 VITALS — BP 131/80 | HR 84 | Ht 63.0 in | Wt 193.4 lb

## 2024-02-23 DIAGNOSIS — Z950 Presence of cardiac pacemaker: Secondary | ICD-10-CM | POA: Insufficient documentation

## 2024-02-23 DIAGNOSIS — I447 Left bundle-branch block, unspecified: Secondary | ICD-10-CM | POA: Diagnosis not present

## 2024-02-23 DIAGNOSIS — I5022 Chronic systolic (congestive) heart failure: Secondary | ICD-10-CM | POA: Diagnosis not present

## 2024-02-23 DIAGNOSIS — E785 Hyperlipidemia, unspecified: Secondary | ICD-10-CM | POA: Insufficient documentation

## 2024-02-23 DIAGNOSIS — I110 Hypertensive heart disease with heart failure: Secondary | ICD-10-CM | POA: Diagnosis not present

## 2024-02-23 DIAGNOSIS — Z860101 Personal history of adenomatous and serrated colon polyps: Secondary | ICD-10-CM | POA: Diagnosis not present

## 2024-02-23 DIAGNOSIS — R9431 Abnormal electrocardiogram [ECG] [EKG]: Secondary | ICD-10-CM | POA: Diagnosis not present

## 2024-02-23 DIAGNOSIS — I5042 Chronic combined systolic (congestive) and diastolic (congestive) heart failure: Secondary | ICD-10-CM | POA: Diagnosis not present

## 2024-02-23 DIAGNOSIS — I428 Other cardiomyopathies: Secondary | ICD-10-CM | POA: Diagnosis not present

## 2024-02-23 DIAGNOSIS — Z79899 Other long term (current) drug therapy: Secondary | ICD-10-CM | POA: Insufficient documentation

## 2024-02-23 LAB — BASIC METABOLIC PANEL WITH GFR
Anion gap: 12 (ref 5–15)
BUN: 11 mg/dL (ref 8–23)
CO2: 23 mmol/L (ref 22–32)
Calcium: 9.5 mg/dL (ref 8.9–10.3)
Chloride: 104 mmol/L (ref 98–111)
Creatinine, Ser: 0.85 mg/dL (ref 0.44–1.00)
GFR, Estimated: >60 mL/min (ref >60)
Glucose, Bld: 92 mg/dL (ref 70–99)
Potassium: 3.8 mmol/L (ref 3.5–5.1)
Sodium: 139 mmol/L (ref 135–145)

## 2024-02-23 LAB — CBC
HCT: 33.3 % — ABNORMAL LOW (ref 36.0–46.0)
Hemoglobin: 11.5 g/dL — ABNORMAL LOW (ref 12.0–15.0)
MCH: 32.4 pg (ref 26.0–34.0)
MCHC: 34.5 g/dL (ref 30.0–36.0)
MCV: 93.8 fL (ref 80.0–100.0)
Platelets: 278 K/uL (ref 150–400)
RBC: 3.55 MIL/uL — ABNORMAL LOW (ref 3.87–5.11)
RDW: 12.8 % (ref 11.5–15.5)
WBC: 5.7 K/uL (ref 4.0–10.5)
nRBC: 0 % (ref 0.0–0.2)

## 2024-02-23 LAB — BRAIN NATRIURETIC PEPTIDE: B Natriuretic Peptide: 278.1 pg/mL — ABNORMAL HIGH (ref 0.0–100.0)

## 2024-02-23 MED ORDER — ENTRESTO 24-26 MG PO TABS: 1.0000 | ORAL_TABLET | Freq: Two times a day (BID) | ORAL | 3 refills | Status: AC

## 2024-02-23 NOTE — Patient Instructions (Signed)
 ? ?  After Your Pacemaker ? ? ?Monitor your pacemaker site for redness, swelling, and drainage. Call the device clinic at [phone] if you experience these symptoms or fever/chills. ? ?

## 2024-02-23 NOTE — Patient Instructions (Signed)
 START Entresto  24/26 mg Twice daily  Labs done today, your results will be available in MyChart, we will contact you for abnormal readings.  Repeat blood work in a week.  Your physician has requested that you have a cardiac MRI. Cardiac MRI uses a computer to create images of your heart as its beating, producing both still and moving pictures of your heart and major blood vessels. For further information please visit [email or web address]. Please follow the instruction sheet given to you today for more information.  Please follow up with our heart failure pharmacist as scheduled.  Your physician recommends that you schedule a follow-up appointment in: 6 weeks.  If you have any questions or concerns before your next appointment please send us  a message through Ida or call our office at [phone].    TO LEAVE A MESSAGE FOR THE NURSE SELECT OPTION 2, PLEASE LEAVE A MESSAGE INCLUDING: YOUR NAME DATE OF BIRTH CALL BACK NUMBER REASON FOR CALL**this is important as we prioritize the call backs  YOU WILL RECEIVE A CALL BACK THE SAME DAY AS LONG AS YOU CALL BEFORE 4:00 PM  At the [facility], you and your health needs are our priority. As part of our continuing mission to provide you with exceptional heart care, we have created designated Provider Care Teams. These Care Teams include your primary Cardiologist (physician) and Advanced Practice Providers (APPs- Physician Assistants and Nurse Practitioners) who all work together to provide you with the care you need, when you need it.   You may see any of the following providers on your designated Care Team at your next follow up: Dr Toribio Fuel Dr Ezra Shuck Dr. Ria Commander Dr. Morene Brownie Amy Lenetta, NP Caffie Shed, [place] [facility] [place], [place] Beckey Coe, NP [place] Lee, NP Ellouise Class, NP Tinnie Redman, PharmD Jaun Bash, PharmD   Please be sure to bring in all your medications  bottles to every appointment.    Thank you for choosing [facility]

## 2024-02-23 NOTE — Telephone Encounter (Signed)
 Advanced Heart Failure Patient Advocate Encounter  Test billing for this patient's current coverage ([employer]) returns a $24 copay for 90 day supply of Entresto .  This test claim was processed through [facility]- copay amounts may vary at other pharmacies due to pharmacy/plan contracts, or as the patient moves through the different stages of their insurance plan.  Rachel DEL, CPhT Rx Patient Advocate Phone: [phone]

## 2024-02-23 NOTE — Progress Notes (Signed)
 Patient seen in device clinic for wound check. Some steri-strips removed by writer that were not intact. Incision that is visible to eye appears well healed. Some ster-strips do remain intact and writer did not attempt to remove considering <10 days post op. Dr. Inocencio in to review and advised no abx. needed at this time and continue to monitor for s/s of infection. Family/patient provided education on s/s of infection and advised do not shower until next wound check apt. Patient/family voiced understanding and agreeable to plan.

## 2024-02-24 ENCOUNTER — Telehealth: Payer: Self-pay | Admitting: Cardiology

## 2024-02-24 NOTE — Telephone Encounter (Signed)
   Patient called outpatient line after being concerned regarding side effects of Entresto  after she picked it up from the pharmacy. She tells me that they pharmacy was only able to dispense 4 days worth to her, as that was all they had in stock. She is most concerned over the symptoms of low blood pressure. She does not have a way to check her BP at home. I reassured her that her BP reading when she was last seen in our office, 131/80, along with being started on the lowest dose of Entresto  I do not expect her to become hypotensive. I did review the signs and symptoms to look out for with low blood pressure. She has close follow up and labs already scheduled with our office. She is agreeable to start taking Entresto  and will take her first dose tonight. All questions were answered.  [name], PA-C 02/24/2024 7:24 PM

## 2024-02-24 NOTE — Progress Notes (Signed)
 Patient ID: Trenesha Alcaide, female   DOB: 08-Mar-1951, 73 y.o.   MRN: [id number] PCP: Mercer Clotilda SAUNDERS, MD HF Cardiology: Rolan  Chief complaint: CHF  Syreeta Figler is a 73 y.o. female with PMH of nonischemic cardiomyopathy referred by NP [facility] for evaluation of CHF.    Patient was admitted to [facility] in 7/13 with acute CHF.  No prior cardiac history.  No ETOH/Drug history.  No significant family history. No prior viral illness. TSH normal.    She had LBBB on EKG and her echo showed EF 15% with severely dilated LV and diastolic dysfunction. LHC showed no significant coronary disease.  Repeat echo in 10/13 on good medical therapy showed persistent EF 15%.  She had Medtronic CRT-D device placed in 12/13 by Dr. Fernande.  Followup echo in 3/14 showed persistently depressed LV systolic function with EF 20%.  CPX in 07/2013 showed mildly decreased functional capacity, suspect obesity played significant role in this.  Repeat echo in 1/17 showed EF up to 55-60%.  Echo in 10/22 showed EF 50-55%.     She was generally doing well until 7/25, when echo was done showing drop in EF back down to 20-25% with septal-lateral dyssynchrony, mild LV dilation, mild RV dysfunction/normal RV size.  She was seen by EP and it was noted that she had problems with her CS lead and had lost adequate BiV pacing.  In 9/25, she had a left bundle lead placed for CRT.    Currently, she is doing well symptomatically.  Even before her left bundle lead was placed, she had minimal symptoms despite fall in EF.  No exertional dyspnea or chest pain.  She can climb a flight of stairs with no problems.  No problems with ADLs.  No lightheadedness.  No orthopnea/PND.    ECG (personally reviewed): NSR, BiV pacing  Medtronic device interrogation: No AF/VT, 99% left bundle pacing.   Labs (8/25): K 4.7, creatinine 0.81 Labs (9/25): LDL 92  Allergies  Allergen Reactions   Decongestant [Pseudoephedrine Hcl Er]     Unknown reaction    Pseudoephedrine Other (See Comments)   Past Medical History: 1. CHF and Nonischemic cardiomyopathy: patient was admitted with acute CHF on 11/29/11 and Echo showed EF 15% with diffuse hypokinesis, severely dilated LV, severe diastolic dysfunction. Left heart cath showed no angiographic CAD.  TSH, SPEP, urine immunofixation were all within normal range.  Repeat echo 10/13 with EF 15%, diffuse hypokinesis.  Medtronic CRT-D device placed 12/13 Robyne).  Echo (3/14) with EF 20%, diffuse hypokinesis, mild to moderate MR, normal RV. CPX (3/15) with peak VO2 13.4 adjusted to 21 for ideal body weight, VE/VCO2 slope 31, RER 1.09, mildly decreased functional capacity, suspect obesity plays a role.  - Echo (1/17): EF improved to 55-60% sp CRT placement.  - Echo (10/22): EF 50-55% - Echo (7/25): EF back down to 20-25% with septal-lateral dyssynchrony, mild LV dilation, mild RV dysfunction/normal RV size. - CS lead noted to be not functioning properly, left bundle lead placed in 9/25 for CRT.  2. LBBB: noted on hospital admission on 11/29/11. No old EKG. 3. HTN 4. Dyslipidemia 5. Tubular adenoma:  Last colonoscopy in 1999. Followed by GI Dr. Kristie.  Family History  Problem Relation Age of Onset   Angina Mother        diagnosed at age of 37's, unsure whether it was a true diagnosis. unsure about the treatment   Dementia Mother    Sleep apnea Brother    Kidney  disease Father    Diabetes Father    Diabetes Brother    Heart failure Maternal Grandmother        diagnosed in age of 44's. still living at age of 31's.   Heart attack Maternal Grandmother    Stroke Maternal Grandmother    Breast cancer Maternal Grandmother    Colon cancer Maternal Grandmother    Colon cancer Other        family history   Intellectual disability Other    Hypertension Neg Hx     Social History   Socioeconomic History   Marital status: Divorced    Spouse name: n/a   Number of children: 1   Years of education: Master's    Highest education level: Not on file  Occupational History   Occupation: Warehouse manager  Tobacco Use   Smoking status: Never   Smokeless tobacco: Never  Vaping Use   Vaping status: Never Used  Substance and Sexual Activity   Alcohol use: No    Alcohol/week: 0.0 standard drinks of alcohol   Drug use: No   Sexual activity: Never  Other Topics Concern   Not on file  Social History Narrative   Lives in [place], and her mother, grandmother and one brother live with her. Divorced with one adult son who lives in [place], [place]. One brother lives in [place], [place]. Another lives in [place]. 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   Social Drivers of Corporate investment banker Strain: Low Risk  (04/16/2021)   Overall Financial Resource Strain (CARDIA)    Difficulty of Paying Living Expenses: Not hard at all  Food Insecurity: No Food Insecurity (02/16/2024)   Hunger Vital Sign    Worried About Running Out of Food in the Last Year: Never true    Ran Out of Food in the Last Year: Never true  Transportation Needs: No Transportation Needs (02/16/2024)   PRAPARE - Administrator, Civil Service (Medical): No    Lack of Transportation (Non-Medical): No  Physical Activity: Insufficiently Active (04/16/2021)   Exercise Vital Sign    Days of Exercise per Week: 5 days    Minutes of Exercise per Session: 20 min  Stress: No Stress Concern Present (04/16/2021)   [employer] of Occupational Health - Occupational Stress Questionnaire    Feeling of Stress : Not at all  Social Connections: Unknown (02/16/2024)   Social Connection and Isolation Panel    Frequency of Communication with Friends and Family: More than three times a week    Frequency of Social Gatherings with Friends and Family: More than three times a week    Attends Religious Services: Not on Marketing executive or Organizations: Not on  file    Attends Banker Meetings: Not on file    Marital Status: Not on file  Intimate Partner Violence: Unknown (02/16/2024)   Humiliation, Afraid, Rape, and Kick questionnaire    Fear of Current or Ex-Partner: No    Emotionally Abused: No    Physically Abused: Not on file    Sexually Abused: Not on file    Current Outpatient Medications on File Prior to Encounter  Medication Sig Dispense Refill   acetaminophen  (TYLENOL ) 500 MG tablet Take 500 mg by mouth at bedtime as needed for mild pain (pain score 1-3) or moderate pain (pain score 4-6).     atorvastatin  (LIPITOR)  40 MG tablet Take 1 tablet (40 mg total) by mouth daily. 90 tablet 3   fexofenadine  (ALLEGRA ) 180 MG tablet TAKE 1 TABLET BY MOUTH EVERY DAY 90 tablet 1   furosemide  (LASIX ) 20 MG tablet TAKE 1 TABLET BY MOUTH EVERY OTHER DAY 90 tablet 1   metoprolol  succinate (TOPROL -XL) 25 MG 24 hr tablet Take 1 tablet (25 mg total) by mouth daily. 90 tablet 3   No current facility-administered medications on file prior to encounter.   Vitals:   02/23/24 1326  BP: 131/80  Pulse: 84  SpO2: 97%  Weight: 87.7 kg (193 lb 6.4 oz)  Height: 5' 3 (1.6 m)   Wt Readings from Last 3 Encounters:  02/23/24 87.7 kg (193 lb 6.4 oz)  02/15/24 86.6 kg (191 lb)  01/19/24 87 kg (191 lb 12.8 oz)    General: NAD Neck: No JVD, no thyromegaly or thyroid  nodule.  Lungs: Clear to auscultation bilaterally with normal respiratory effort. CV: Nondisplaced PMI.  Heart regular S1/S2, no S3/S4, no murmur.  No peripheral edema.  No carotid bruit.  Normal pedal pulses.  Abdomen: Soft, nontender, no hepatosplenomegaly, no distention.  Skin: Intact without lesions or rashes.  Neurologic: Alert and oriented x 3.  Psych: Normal affect. Extremities: No clubbing or cyanosis.  HEENT: Normal.   Assessment/Plan:  1. Chronic systolic heart failure: Nonischemic cardiomyopathy found in 2013, s/p Medtronic CRT-D.  Possible LBBB cardiomyopathy. Echo in  1/17 showed EF improved to 55-60% and echo in 10/22 showed EF 50-55%.  Echo in 7/25, however, showed drop in EF to 20-25% with septal-lateral dyssynchrony, mild LV dilation, mild RV dysfunction/normal RV size.  CS lead was not working appropriately and she had lost CRT.  In 9/25, left bundle lead was placed for CRT and she is pacing appropriately on interrogation today.  NYHA class I, not volume overloaded on exam.  I will work on titrating up her GDMT.  I hope that her LV function will improve now that she again has effective CRT.   - Continue Toprol  XL 25 mg daily.  - She has been on Lasix  20 mg daily, can continue for now.  - Add Entresto  24/26 bid.  BMET/BNP today and BMET in 1 week.  - I will arrange for cardiac MRI in 3 months after medication titration assuming her device is MRI compatible.  2. LBBB: s/p CRT-D placement with improvement of EF initially then drop in EF with loss of CRT.  3. HTN: Meds as above.    Followup in 2 wks with HF pharmacist for med titration x 2 visits starting in 2 wks.  See HF APP in 6 wks.   I spent 56 minutes reviewing records, interviewing/examining patient, and managing orders.   Ezra Shuck, MD  02/24/2024

## 2024-02-24 NOTE — Progress Notes (Signed)
Remote ICD Transmission.

## 2024-02-28 ENCOUNTER — Other Ambulatory Visit (HOSPITAL_COMMUNITY): Payer: Self-pay

## 2024-02-28 ENCOUNTER — Ambulatory Visit: Attending: Cardiovascular Disease

## 2024-02-28 DIAGNOSIS — I447 Left bundle-branch block, unspecified: Secondary | ICD-10-CM

## 2024-02-28 DIAGNOSIS — I5022 Chronic systolic (congestive) heart failure: Secondary | ICD-10-CM

## 2024-02-28 LAB — CUP PACEART INCLINIC DEVICE CHECK
Date Time Interrogation Session: 20250930120902
Implantable Lead Connection Status: 753985
Implantable Lead Connection Status: 753985
Implantable Lead Connection Status: 753985
Implantable Lead Implant Date: 20131211
Implantable Lead Implant Date: 20131211
Implantable Lead Implant Date: 20131211
Implantable Lead Location: 753858
Implantable Lead Location: 753859
Implantable Lead Location: 753860
Implantable Lead Model: 181
Implantable Lead Model: 4396
Implantable Lead Model: 5076
Implantable Lead Serial Number: 32342
Implantable Pulse Generator Implant Date: 20250917

## 2024-02-28 MED ORDER — CEPHALEXIN 500 MG PO CAPS: 500.0000 mg | ORAL_CAPSULE | Freq: Two times a day (BID) | ORAL | 0 refills | Status: DC

## 2024-02-28 NOTE — Progress Notes (Signed)
 Normal multi chamber ICD programmed RV only wound check.   Presenting rhythm: AS/VS 80. Routine testing performed. Thresholds, sensing, and impedance consistent with implant measurements with 3.5V safety margin/auto capture until 3 month visit. No treated arrhythmias. Reviewed arm restrictions to continue for 6 weeks total post op. Reviewed shock plan.  Pt enrolled in remote follow-up.  Wound site assessed.  Scabbed area at distal incision.  This was carefully removed and stitch noted.  Stitch removed.  Small open area noted.  No s/s of infection.  Evaluated with Dr. Almetta.  Wound cleansed and incision approximated with dermabond and steristrips and covered with dressing.  Keflex prescribed for 7 days.  Pt given return precautions and 7 day follow up scheduled to reevaluate wound site.

## 2024-02-28 NOTE — Patient Instructions (Addendum)
 Take Keflex 500 mg-  Take one tablet by mouth twice a day x 7 days. Follow up next Tuesday March 06, 2024 at 8:40 am with Randall in the Device clinic  After Your ICD (Implantable Cardiac Defibrillator)    Monitor your defibrillator site for redness, swelling, and drainage. Call the device clinic at [phone] if you experience these symptoms or fever/chills.  Your incision was closed with Steri-strips or staples:  You may shower 7 days after your procedure and wash your incision with soap and water. Avoid lotions, ointments, or perfumes over your incision until it is well-healed.  You may use a hot tub or a pool after your wound check appointment if the incision is completely closed.  Do not lift, push or pull greater than 10 pounds with the affected arm until 6 weeks after your procedure. There are no other restrictions in arm movement after your wound check appointment.  Your ICD is designed to protect you from life threatening heart rhythms. Because of this, you may receive a shock.   1 shock with no symptoms:  Call the office during business hours. 1 shock with symptoms (chest pain, chest pressure, dizziness, lightheadedness, shortness of breath, overall feeling unwell):  Call 911. If you experience 2 or more shocks in 24 hours:  Call 911. If you receive a shock, you should not drive.  [place] DMV - no driving for 6 months if you receive appropriate therapy from your ICD.   ICD Alerts:  Some alerts are vibratory and others beep. These are NOT emergencies. Please call our office to let us  know. If this occurs at night or on weekends, it can wait until the next business day. Send a remote transmission.  If your device is capable of reading fluid status (for heart failure), you will be offered monthly monitoring to review this with you.   Remote monitoring is used to monitor your ICD from home. This monitoring is scheduled every 91 days by our office. It allows us  to keep an eye on the  functioning of your device to ensure it is working properly. You will routinely see your Electrophysiologist annually (more often if necessary).

## 2024-03-01 ENCOUNTER — Ambulatory Visit (HOSPITAL_COMMUNITY)
Admission: RE | Admit: 2024-03-01 | Discharge: 2024-03-01 | Disposition: A | Discharge status: Home / Self Care | Source: Ambulatory Visit | Attending: Cardiology | Admitting: Cardiology

## 2024-03-01 ENCOUNTER — Ambulatory Visit: Payer: Self-pay | Admitting: Cardiology

## 2024-03-01 DIAGNOSIS — I5022 Chronic systolic (congestive) heart failure: Secondary | ICD-10-CM | POA: Diagnosis not present

## 2024-03-01 LAB — BASIC METABOLIC PANEL WITH GFR
Anion gap: 12 (ref 5–15)
BUN: 7 mg/dL — ABNORMAL LOW (ref 8–23)
CO2: 24 mmol/L (ref 22–32)
Calcium: 9.4 mg/dL (ref 8.9–10.3)
Chloride: 104 mmol/L (ref 98–111)
Creatinine, Ser: 0.88 mg/dL (ref 0.44–1.00)
GFR, Estimated: >60 mL/min (ref >60)
Glucose, Bld: 98 mg/dL (ref 70–99)
Potassium: 3.4 mmol/L — ABNORMAL LOW (ref 3.5–5.1)
Sodium: 140 mmol/L (ref 135–145)

## 2024-03-06 ENCOUNTER — Ambulatory Visit: Attending: Student in an Organized Health Care Education/Training Program

## 2024-03-06 ENCOUNTER — Other Ambulatory Visit: Payer: Self-pay | Admitting: Student in an Organized Health Care Education/Training Program

## 2024-03-06 DIAGNOSIS — I447 Left bundle-branch block, unspecified: Secondary | ICD-10-CM

## 2024-03-06 DIAGNOSIS — I5022 Chronic systolic (congestive) heart failure: Secondary | ICD-10-CM

## 2024-03-06 NOTE — Progress Notes (Signed)
 Pt seen in follow up to recheck wound site.  Dressings removed.  Small distal open area remains.  No drainage noted.  Wound bed is more shallow than noted last week.  No s/s of infection.  Advised Pt to leave site open to air.  Advised to wash area with soap and water twice a day.  She will continue to monitor site and call device clinic if any changes noted.  Scheduled Pt for follow up next Tuesday to reevaluate site.

## 2024-03-06 NOTE — Patient Instructions (Signed)
  Monitor your pacemaker site for redness, swelling, and drainage. Call the device clinic at [phone] if you experience these symptoms or fever/chills.  Your incision was closed with Steri-strips or staples:  You may shower 7 days after your procedure and wash your incision with soap and water. Avoid lotions, ointments, or perfumes over your incision until it is well-healed.   You are scheduled to see Device Clinic Tuesday March 13, 2024 at 8:00 am.

## 2024-03-07 NOTE — Progress Notes (Unsigned)
 Cardiology Office Note    Date:  03/08/2024  ID:  Cynthia, Roth March 10, 1951, MRN [id number] PCP:  Mercer Clotilda SAUNDERS, MD  Cardiologist:  Annabella Scarce, MD  Electrophysiologist:  Fonda Kitty, MD   Chief Complaint: Follow up for HFrEF History of Present Illness: .   Cynthia Roth is a 73 y.o. female with visit-pertinent history of chronic systolic and diastolic heart failure resolved after CRT-D, CAD, hypertension and left bundle branch block.  Patient was previously followed by Dr. Rolan.  She was admitted to [facility] in 11/2011 with acute systolic and diastolic heart failure.  Her LV was severely dilated and LVEF was 15%, also had a left bundle branch block.  LHC at that time showed mild, nonobstructive CAD.  Repeat echo 3 months later showed persistently depressed LV function.  She had a CRT-D implanted in 04/2012.  Follow-up echo in 07/2012 showed LVEF 20% with mild to moderate MR and normal RV function.  Repeat echo in 06/2015 showed improved EF to 55 to 60% and was again 50 to 55% in 2018.  She was seen by Dr. Rolan in 12/2016 and had been discharged from heart failure clinic given her clinical improvement.  Patient establish care with Dr. Scarce on 06/11/2020.  Her lisinopril  has been reduced from 40 mg to 20 mg due to hypotension.  Patient noted that time she is overall been feeling well however she been having problems with bilateral knee and ankle pain.  Patient has started walking [place] an hour [place] the mall however this was inconsistent.  Echocardiogram on 03/26/2021 indicated LVEF 50 to 55%, no RWMA, severe asymmetric LVH of the basal septal segment, LV diastolic parameters were consistent with G1 DD, RV systolic function and size was normal, normal artery pressures, aortic valve regurgitation was not visualized, no stenosis was present.  Patient was seen in clinic on 11/09/2023, she reported she been doing well.  She reported she had not been exercising lately as  she had been busy taking care of numerous family members.  She reported she had also stopped a majority of her medications, was unsure as to why.  Patient's metoprolol  succinate 25 mg daily was resumed and echocardiogram was checked.  Echocardiogram on 12/23/2023 indicated LVEF 20 to 25%, global hypokinesis with septal lateral dyssynchrony suggestive of left bundle branch block, internal cavity size was mildly dilated, G1 DD, RV systolic function was mildly reduced, there are no significant valve abnormalities.  Patient followed up with Dr. Kitty to establish care and to check on her device battery.  It was noted that patient had a lack of adequate BiV pacing, noted to have 1 month estimated longevity until ERI or, felt that best option would be to implant a new CS or left bundle branch area pacing lead at time of generator change.  On 02/15/2024 patient had left bundle lead placed for CRT.  Patient established care with Dr. Rolan on 02/23/2024.  Now working on titrated medications.  Today she presents for follow-up.  She reports that she has been doing well overall. She denies chest pain, shortness of breath, palpitations, orthopnea or pnd.  She notes intermittent lower extremity edema, reports this happens once in a blue moon, has overall been well-controlled.  She denies any presyncope or syncope.  Patient reports that she has tolerated starting on Entresto  well.  She denies any cardiac concerns or complaints today. ROS: .   Today she denies chest pain, shortness of breath, lower extremity edema, fatigue,  palpitations, melena, hematuria, hemoptysis, diaphoresis, weakness, presyncope, syncope, orthopnea, and PND.  All other systems are reviewed and otherwise negative. Studies Reviewed: SABRA   EKG:  EKG is not ordered today.  CV Studies: Cardiac studies reviewed are outlined and summarized above. Otherwise please see EMR for full report. Cardiac Studies & Procedures    ______________________________________________________________________________________________     ECHOCARDIOGRAM  ECHOCARDIOGRAM COMPLETE 12/23/2023  Narrative ECHOCARDIOGRAM REPORT    Patient Name:   Cynthia Roth Date of Exam: 12/23/2023 Medical Rec #:  [id number]                Height:       63.0 in Accession #:    [id number]               Weight:       191.0 lb Date of Birth:  09/19/1950                BSA:          1.896 m Patient Age:    73 years                 BP:           118/68 mmHg Patient Gender: F                        HR:           81 bpm. Exam Location:  [address]  Procedure: 2D Echo, Cardiac Doppler, Color Doppler and 3D Echo (Both Spectral and Color Flow Doppler were utilized during procedure).  Indications:    Heart Failure I50.9; LVH  History:        Patient has prior history of Echocardiogram examinations, most recent 03/27/2021. CAD, Defibrillator, Arrythmias:LBBB; Risk Factors:Hypertension and Dyslipidemia.  Sonographer:    Augustin Seals RDCS Referring Phys: [id number] [name]  IMPRESSIONS   1. Left ventricular ejection fraction, by estimation, is 20 to 25%. Left ventricular ejection fraction by 3D volume is 22 %. Left ventricular ejection fraction by 2D MOD biplane is 24.0 %. The left ventricle has severely decreased function. The left ventricle demonstrates global hypokinesis with septal-lateral dyssynchrony suggestive of LBBB. The left ventricular internal cavity size was mildly dilated. Left ventricular diastolic parameters are consistent with Grade I diastolic dysfunction (impaired relaxation). 2. Right ventricular systolic function is mildly reduced. The right ventricular size is normal. Tricuspid regurgitation signal is inadequate for assessing PA pressure. 3. Left atrial size was mild to moderately dilated. 4. The mitral valve is normal in structure. Mild mitral valve regurgitation. No evidence of mitral stenosis. 5. The  aortic valve is tricuspid. Aortic valve regurgitation is not visualized. No aortic stenosis is present. 6. The inferior vena cava is normal in size with greater than 50% respiratory variability, suggesting right atrial pressure of 3 mmHg.  FINDINGS Left Ventricle: Left ventricular ejection fraction, by estimation, is 20 to 25%. Left ventricular ejection fraction by 2D MOD biplane is 24.0 %. Left ventricular ejection fraction by 3D volume is 22 %. The left ventricle has severely decreased function. The left ventricle demonstrates global hypokinesis. The left ventricular internal cavity size was mildly dilated. There is no left ventricular hypertrophy. Left ventricular diastolic parameters are consistent with Grade I diastolic dysfunction (impaired relaxation).  Right Ventricle: The right ventricular size is normal. No increase in right ventricular wall thickness. Right ventricular systolic function is mildly reduced. Tricuspid regurgitation signal is inadequate for assessing PA pressure.  Left Atrium: Left atrial size was mild to moderately dilated.  Right Atrium: Right atrial size was normal in size.  Pericardium: There is no evidence of pericardial effusion.  Mitral Valve: The mitral valve is normal in structure. Mild mitral valve regurgitation. No evidence of mitral valve stenosis.  Tricuspid Valve: The tricuspid valve is normal in structure. Tricuspid valve regurgitation is not demonstrated.  Aortic Valve: The aortic valve is tricuspid. Aortic valve regurgitation is not visualized. No aortic stenosis is present.  Pulmonic Valve: The pulmonic valve was normal in structure. Pulmonic valve regurgitation is not visualized.  Aorta: The aortic root is normal in size and structure.  Venous: The inferior vena cava is normal in size with greater than 50% respiratory variability, suggesting right atrial pressure of 3 mmHg.  IAS/Shunts: No atrial level shunt detected by color flow  Doppler.  Additional Comments: 3D was performed not requiring image post processing on an independent workstation and was abnormal. A device lead is visualized in the right ventricle.   LEFT VENTRICLE PLAX 2D                        Biplane EF (MOD) LVIDd:         5.30 cm         LV Biplane EF:   Left LVIDs:         4.90 cm                          ventricular LV PW:         1.00 cm                          ejection LV IVS:        0.90 cm                          fraction by LVOT diam:     2.10 cm                          2D MOD LV SV:         54                               biplane is LV SV Index:   28                               24.0 %. LVOT Area:     3.46 cm  LV Volumes (MOD)               3D Volume EF LV vol d, MOD    164.0 ml      LV 3D EF:    Left A2C:                                        ventricul LV vol d, MOD    192.0 ml                   ar A4C:  ejection LV vol s, MOD    121.0 ml                   fraction A2C:                                        by 3D LV vol s, MOD    149.0 ml                   volume is A4C:                                        22 %. LV SV MOD A2C:   43.0 ml LV SV MOD A4C:   192.0 ml LV SV MOD BP:    42.9 ml       3D Volume EF: 3D EF:        22 % LV EDV:       217 ml LV ESV:       169 ml LV SV:        48 ml  RIGHT VENTRICLE             IVC RV Basal diam:  2.90 cm     IVC diam: 1.80 cm RV Mid diam:    1.60 cm RV S prime:     11.20 cm/s TAPSE (M-mode): 1.5 cm  LEFT ATRIUM             Index        RIGHT ATRIUM           Index LA diam:        3.40 cm 1.79 cm/m   RA Area:     14.10 cm LA Vol (A2C):   85.1 ml 44.88 ml/m  RA Volume:   29.70 ml  15.66 ml/m LA Vol (A4C):   45.1 ml 23.78 ml/m LA Biplane Vol: 65.0 ml 34.28 ml/m AORTIC VALVE LVOT Vmax:   77.90 cm/s LVOT Vmean:  54.050 cm/s LVOT VTI:    0.155 m  AORTA Ao Root diam: 2.80 cm Ao Asc diam:  3.10 cm  MITRAL VALVE MV Area (PHT):  4.39 cm    SHUNTS MV Decel Time: 173 msec    Systemic VTI:  0.15 m MV E velocity: 55.50 cm/s  Systemic Diam: 2.10 cm MV A velocity: 92.20 cm/s MV E/A ratio:  0.60  Dalton McleanMD Electronically signed by Ezra Kanner Signature Date/Time: 12/23/2023/5:24:40 PM    Final          ______________________________________________________________________________________________       Current Reported Medications:.    Current Meds  Medication Sig   acetaminophen  (TYLENOL ) 500 MG tablet Take 500 mg by mouth at bedtime as needed for mild pain (pain score 1-3) or moderate pain (pain score 4-6).   atorvastatin  (LIPITOR) 40 MG tablet Take 1 tablet (40 mg total) by mouth daily.   cephALEXin (KEFLEX) 500 MG capsule TAKE 1 CAPSULE BY MOUTH TWICE A DAY   ENTRESTO  24-26 MG Take 1 tablet by mouth 2 (two) times daily.   fexofenadine  (ALLEGRA ) 180 MG tablet TAKE 1 TABLET BY MOUTH EVERY DAY   metoprolol  succinate (TOPROL -XL) 25 MG 24 hr tablet Take 1 tablet (25 mg total) by mouth daily.    Physical Exam:    VS:  BP 101/62 (BP Location: Left Arm, Patient Position: Sitting, Cuff Size: Large)   Pulse 75   Resp 16   Ht 5' 3 (1.6 m)   Wt 190 lb 3.2 oz (86.3 kg)   SpO2 97%   BMI 33.69 kg/m    Wt Readings from Last 3 Encounters:  03/08/24 190 lb 3.2 oz (86.3 kg)  02/23/24 193 lb 6.4 oz (87.7 kg)  02/15/24 191 lb (86.6 kg)    GEN: Well nourished, well developed in no acute distress NECK: No JVD; No carotid bruits CARDIAC: RRR, no murmurs, rubs, gallops RESPIRATORY:  Clear to auscultation without rales, wheezing or rhonchi  ABDOMEN: Soft, non-tender, non-distended EXTREMITIES:  No edema; No acute deformity     Asessement and Plan:.    Nonischemic cardiomyopathy/ICD/severe LVH: Echo in 2013 indicated LVEF 15% with severely dilated LV and left bundle branch block.  LHC showed at the time showed mild nonobstructive CAD.  Patient had CRT-D implanted in 04/2012.  Patient with recovery of EF  in 2017, discharge from heart failure clinic in 2018.  Echo in 2022 indicated LVEF 50 to 55%, no RWMA, severe asymmetric LVH of the basal septal segment, LV diastolic parameters are consistent with G1 DD, RV systolic function and size was normal.  Office visit on 6/11 patient's reported that she had stopped all of her medications, was restarted on metoprolol  succinate 25 mg daily.  Echo 12/23/2023 indicated LVEF 20 to 25%, global hypokinesis with septal lateral dyssynchrony suggestive of left bundle branch block, internal cavity size was mildly dilated, G1 DD, RV systolic function was mildly reduced, there are no significant valve abnormalities. Seen by Dr. Kennyth, felt drop was likely contributed to by lack of adequate BiV pacing.  In 01/2024 patient had left bundle lead for CRT.  Evaluated by Dr. Rolan and started on Entresto  24/26 twice daily.  Patient reports that she is doing very well and is tolerating Entresto , denies any chest pain, shortness of breath, notes very rare increased lower extremity edema, denies orthopnea or PND.  Discussed with patient starting on Jardiance 10 mg daily today, she denies any significant history of UTIs however patient deferred any medication changes today, reports that she will think about starting on Jardiance and discuss further with pharmacy team next week. Encouraged patient to monitor for increased lower extremity edema, shortness of breath, weight gain of 2 to 3 pounds overnight or 5 pounds in a week.  Continue Toprol  25 mg daily, Lasix  5 mg daily and Entresto  24/26 mg twice daily.  CAD: Cardiac catheterization in 2013 indicated mild nonobstructive CAD. Stable with no anginal symptoms. No indication for ischemic evaluation.  Heart healthy diet and regular cardiovascular exercise encouraged. Continue aspirin  81 mg daily and Lipitor 20 mg daily.   HTN: Blood pressure today 101/62.  She denies any dizziness, lightheadedness, presyncope or syncope.  Reports she is  tolerating Entresto  well. Continue metoprolol  succinate 25 mg nightly and Entresto  24-26 mg twice daily.   Hyperlipidemia: Last lipid profile on 01/19/2024 indicated total cholesterol 179, HDL 43, triglycerides 126 and LDL 113.  Patient's atorvastatin  was increased to 40 mg daily.  Check fasting lipid profile and LFTs.   Disposition: F/u with heart failure clinic next week as scheduled, Dr. Raford in 4-5 months.   Signed, Heran Campau D Marcial Pless, NP

## 2024-03-08 ENCOUNTER — Encounter: Payer: Self-pay | Admitting: Cardiology

## 2024-03-08 ENCOUNTER — Ambulatory Visit: Attending: Cardiology | Admitting: Cardiology

## 2024-03-08 VITALS — BP 101/62 | HR 75 | Resp 16 | Ht 63.0 in | Wt 190.2 lb

## 2024-03-08 DIAGNOSIS — I5022 Chronic systolic (congestive) heart failure: Secondary | ICD-10-CM | POA: Diagnosis not present

## 2024-03-08 DIAGNOSIS — I10 Essential (primary) hypertension: Secondary | ICD-10-CM

## 2024-03-08 DIAGNOSIS — Z95810 Presence of automatic (implantable) cardiac defibrillator: Secondary | ICD-10-CM

## 2024-03-08 DIAGNOSIS — I447 Left bundle-branch block, unspecified: Secondary | ICD-10-CM

## 2024-03-08 DIAGNOSIS — E782 Mixed hyperlipidemia: Secondary | ICD-10-CM | POA: Diagnosis not present

## 2024-03-08 DIAGNOSIS — I2510 Atherosclerotic heart disease of native coronary artery without angina pectoris: Secondary | ICD-10-CM | POA: Diagnosis not present

## 2024-03-08 NOTE — Patient Instructions (Signed)
 Lab Work: Please complete fasting Lipid panel and LFT's  If you have labs (blood work) drawn today and your tests are completely normal, you will receive your results only by: MyChart Message (if you have MyChart) OR A paper copy in the mail If you have any lab test that is abnormal or we need to change your treatment, we will call you to review the results.  Follow-Up: At [facility], you and your health needs are our priority.  As part of our continuing mission to provide you with exceptional heart care, our providers are all part of one team.  This team includes your primary Cardiologist (physician) and Advanced Practice Providers or APPs (Physician Assistants and Nurse Practitioners) who all work together to provide you with the care you need, when you need it.  Your next appointment:   4-5 month(s)  Provider:   Annabella Scarce, MD

## 2024-03-12 ENCOUNTER — Encounter

## 2024-03-13 ENCOUNTER — Ambulatory Visit: Attending: Cardiology

## 2024-03-13 DIAGNOSIS — I5022 Chronic systolic (congestive) heart failure: Secondary | ICD-10-CM

## 2024-03-13 NOTE — Patient Instructions (Signed)
 Continue to monitor wound site for any signs or symptoms of infection.  Call Device Clinic at [phone] if any concerns.  Follow up as scheduled.

## 2024-03-13 NOTE — Progress Notes (Signed)
 Pt seen in follow up to recheck wound site.   Small distal open area now has a small scab.  Site is dry.  No redness or drainage noted.   Site assessed with Dr. Nancey.   Agrees no s/s of infection.  Area will continue to heal by secondary intention.  Advised Pt that eventually the scab will fall off.  Advised to continue to monitor site and call device clinic if any changes noted.    Pt indicates understanding.  She will follow as scheduled and contact Device clinic if any concerns.

## 2024-03-15 ENCOUNTER — Ambulatory Visit (HOSPITAL_COMMUNITY): Payer: Self-pay | Admitting: Cardiology

## 2024-03-15 ENCOUNTER — Ambulatory Visit (HOSPITAL_COMMUNITY)
Admission: RE | Admit: 2024-03-15 | Discharge: 2024-03-15 | Disposition: A | Discharge status: Home / Self Care | Source: Ambulatory Visit | Attending: Internal Medicine | Admitting: Internal Medicine

## 2024-03-15 ENCOUNTER — Telehealth (HOSPITAL_COMMUNITY): Payer: Self-pay

## 2024-03-15 VITALS — BP 108/72 | HR 83

## 2024-03-15 DIAGNOSIS — I5022 Chronic systolic (congestive) heart failure: Secondary | ICD-10-CM | POA: Diagnosis not present

## 2024-03-15 DIAGNOSIS — I428 Other cardiomyopathies: Secondary | ICD-10-CM | POA: Diagnosis not present

## 2024-03-15 DIAGNOSIS — Z79899 Other long term (current) drug therapy: Secondary | ICD-10-CM | POA: Diagnosis not present

## 2024-03-15 DIAGNOSIS — I110 Hypertensive heart disease with heart failure: Secondary | ICD-10-CM | POA: Insufficient documentation

## 2024-03-15 LAB — BASIC METABOLIC PANEL WITH GFR
Anion gap: 10 (ref 5–15)
BUN: 8 mg/dL (ref 8–23)
CO2: 28 mmol/L (ref 22–32)
Calcium: 9.3 mg/dL (ref 8.9–10.3)
Chloride: 106 mmol/L (ref 98–111)
Creatinine, Ser: 0.95 mg/dL (ref 0.44–1.00)
GFR, Estimated: >60 mL/min (ref >60)
Glucose, Bld: 109 mg/dL — ABNORMAL HIGH (ref 70–99)
Potassium: 3.7 mmol/L (ref 3.5–5.1)
Sodium: 144 mmol/L (ref 135–145)

## 2024-03-15 MED ORDER — SPIRONOLACTONE 25 MG PO TABS: 12.5000 mg | ORAL_TABLET | Freq: Every day | ORAL | 3 refills | Status: DC

## 2024-03-15 NOTE — Patient Instructions (Signed)
 It was a pleasure seeing you today!  MEDICATIONS: -Start taking spironolactone  12.5 mg (one-half tablet) once daily -Call if you have questions about your medications.  LABS: -We will call you if your labs need attention.  NEXT APPOINTMENT: Return to clinic on 04/05/24    In general, to take care of your heart failure: -Limit your fluid intake to 2 Liters (half-gallon) per day.   -Limit your salt intake to ideally 2-3 grams (2000-3000 mg) per day. -Weigh yourself daily and record, and bring that weight diary to your next appointment.  (Weight gain of 2-3 pounds in 1 day typically means fluid weight.) -The medications for your heart are to help your heart and help you live longer.   -Please contact us  before stopping any of your heart medications.  Call the clinic at [phone] with questions or to reschedule future appointments.

## 2024-03-15 NOTE — Progress Notes (Signed)
 Advanced Heart Failure Clinic Note  PCP: Mercer Clotilda SAUNDERS, MD PCP-Cardiologist: Annabella Scarce, MD HF-Cardiologist: Ezra Shuck, MD  HPI:  Cynthia Roth is a 73 y.o. female with PMH of nonischemic cardiomyopathy referred by NP [facility] for evaluation of CHF.     Patient was admitted to [facility] in 11/2011 with acute CHF.  No prior cardiac history.  No ETOH/Drug history.  No significant family history. No prior viral illness. TSH normal.     She had LBBB on EKG and her echo showed EF 15% with severely dilated LV and diastolic dysfunction. LHC showed no significant coronary disease.  Repeat echo in 02/2012 on good medical therapy showed persistent EF 15%.  She had Medtronic CRT-D device placed in 04/2012 by Dr. Fernande.  Followup echo in 07/2012 showed persistently depressed LV systolic function with EF 20%.  CPX in 07/2013 showed mildly decreased functional capacity, suspect obesity played significant role in this.  Repeat echo in 06/2015 showed EF up to 55-60%.  Echo in 02/2021 showed EF 50-55%.      She was generally doing well until 11/2023, when echo was done showing drop in EF back down to 20-25% with septal-lateral dyssynchrony, mild LV dilation, mild RV dysfunction/normal RV size.  She was seen by EP and it was noted that she had problems with her CS lead and had lost adequate BiV pacing.  In 01/2024, she had a left bundle lead placed for CRT.     Today Cynthia Roth returns to Heart Failure Clinic for pharmacist medication titration. Reports feeling excellent overall. Denies fatigue, chest pain, palpitations, SOB, LEE, orthopnea, orthostasis, and PND. Reports being able to complete all activities of daily living (ADLs). Is not very active throughout the day, but wants to start walking soon. Weight is not measured at home. Takes furosemide  20 mg every other day. Appetite has been poor. Does follow a low sodium diet.  Past Medical History: 1. CHF and Nonischemic cardiomyopathy:  patient was admitted with acute CHF on 11/29/11 and Echo showed EF 15% with diffuse hypokinesis, severely dilated LV, severe diastolic dysfunction. Left heart cath showed no angiographic CAD.  TSH, SPEP, urine immunofixation were all within normal range.  Repeat echo 02/2012 with EF 15%, diffuse hypokinesis.  Medtronic CRT-D device placed 04/2012 [place]).  Echo (07/2012) with EF 20%, diffuse hypokinesis, mild to moderate MR, normal RV. CPX (07/2013) with peak VO2 13.4 adjusted to 21 for ideal body weight, VE/VCO2 slope 31, RER 1.09, mildly decreased functional capacity, suspect obesity plays a role.  - Echo (06/2015): EF improved to 55-60% sp CRT placement.  - Echo (02/2021): EF 50-55% - Echo (11/2023): EF back down to 20-25% with septal-lateral dyssynchrony, mild LV dilation, mild RV dysfunction/normal RV size. - CS lead noted to be not functioning properly, left bundle lead placed in 9/25 for CRT.  2. LBBB: noted on hospital admission on 11/29/11. No old EKG. 3. HTN 4. Dyslipidemia 5. Tubular adenoma:  Last colonoscopy in 1999. Followed by GI Dr. Kristie.  Current Heart Failure Medications: Loop diuretic: furosemide  20 mg every other day Beta-Blocker: metoprolol  succinate 25 mg daily ACEI/ARB/ARNI: Entresto  24-26 mg BID MRA: none SGLT2i: none Other: none  Has the patient been experiencing any side effects to the medications prescribed? No  Does the patient have any problems obtaining medications due to transportation or finances? No  Understanding of regimen: Poor  Understanding of indications: Poor  Potential of adherence: Fair  Patient understands to avoid NSAIDs.  Patient understands to avoid  decongestants.  Pertinent Lab Values: Creat  Date Value Ref Range Status  02/27/2020 1.46 (H) 0.50 - 0.99 mg/dL Final    Comment:    For patients >34 years of age, the reference limit for Creatinine is approximately 13% higher for people identified as African-American. .    Creatinine,  Ser  Date Value Ref Range Status  03/01/2024 0.88 0.44 - 1.00 mg/dL Final   BUN  Date Value Ref Range Status  03/01/2024 7 (L) 8 - 23 mg/dL Final  91/78/7974 12 8 - 27 mg/dL Final   Potassium  Date Value Ref Range Status  03/01/2024 3.4 (L) 3.5 - 5.1 mmol/L Final   Sodium  Date Value Ref Range Status  03/01/2024 140 135 - 145 mmol/L Final  01/19/2024 144 134 - 144 mmol/L Final   B Natriuretic Peptide  Date Value Ref Range Status  02/23/2024 278.1 (H) 0.0 - 100.0 pg/mL Final    Comment:    Performed at [facility], [address], [place], [place] [address]   Magnesium   Date Value Ref Range Status  05/10/2012 1.7 1.5 - 2.5 mg/dL Final   TSH  Date Value Ref Range Status  04/26/2022 1.71 0.35 - 5.50 uIU/mL Final    Vital Signs: Today's Vitals   03/15/24 1335  BP: 108/72  Pulse: 83  SpO2: 98%    Assessment/Plan: 1. Chronic systolic heart failure: Nonischemic cardiomyopathy found in 2013, s/p Medtronic CRT-D.  Possible LBBB cardiomyopathy. Echo in 06/2015 showed EF improved to 55-60% and echo in 02/2021 showed EF 50-55%.  Echo in 11/2023, however, showed drop in EF to 20-25% with septal-lateral dyssynchrony, mild LV dilation, mild RV dysfunction/normal RV size.  CS lead was not working appropriately and she had lost CRT.  In 01/2024, left bundle lead was placed for CRT and she is pacing appropriately last interrogation.  NYHA class I, not volume overloaded on exam.  It is unclear why GDMT was stopped, currently in the titration phase. Hopefully her LV function will improve now that she again has effective CRT.   - Continue Toprol  XL 25 mg daily.  - She has been on Lasix  20 mg daily, can continue for now. Patient notified to reach out if she experiences symptoms of hypovolemia reviewed today.  - Continue Entresto  24/26 bid.  BMET/BNP today - Start spironolactone  12.5 mg daily and return for labs in 1 week - Patient hesitant to start SGLT2i today, will consider at next  visit. No recent history of UTIs - MD to arrange for cardiac MRI in 3 months after medication titration assuming her device is MRI compatible.  2. LBBB: s/p CRT-D placement with improvement of EF initially then drop in EF with loss of CRT.  3. HTN: Meds as above.     Follow up: 2 wks with HF pharmacist for med titration and HF APP in 6 wks.   Please do not hesitate to reach out with questions or concerns,  Jaun Bash, PharmD, CPP, BCPS, [facility] Heart Failure Pharmacist  Phone - [phone] 03/15/2024 3:52 PM

## 2024-03-30 ENCOUNTER — Other Ambulatory Visit (HOSPITAL_COMMUNITY)

## 2024-04-04 ENCOUNTER — Telehealth (HOSPITAL_COMMUNITY): Payer: Self-pay

## 2024-04-04 NOTE — Telephone Encounter (Signed)
 Called to confirm/remind patient of their appointment at the Advanced Heart Failure Clinic on 04/05/24.   Appointment:   [x] Confirmed  [] Left mess   [] No answer/No voice mail  [] VM Full/unable to leave message  [] Phone not in service  Patient reminded to bring all medications and/or complete list.  Confirmed patient has transportation. Gave directions, instructed to utilize valet parking.

## 2024-04-05 ENCOUNTER — Ambulatory Visit (HOSPITAL_COMMUNITY)
Admission: RE | Admit: 2024-04-05 | Discharge: 2024-04-05 | Disposition: A | Discharge status: Home / Self Care | Source: Ambulatory Visit | Attending: Cardiology | Admitting: Cardiology

## 2024-04-05 ENCOUNTER — Encounter (HOSPITAL_COMMUNITY): Payer: Self-pay

## 2024-04-05 VITALS — BP 106/74 | HR 81 | Wt 182.2 lb

## 2024-04-05 DIAGNOSIS — Z79899 Other long term (current) drug therapy: Secondary | ICD-10-CM | POA: Insufficient documentation

## 2024-04-05 DIAGNOSIS — I110 Hypertensive heart disease with heart failure: Secondary | ICD-10-CM | POA: Diagnosis not present

## 2024-04-05 DIAGNOSIS — Z8249 Family history of ischemic heart disease and other diseases of the circulatory system: Secondary | ICD-10-CM | POA: Diagnosis not present

## 2024-04-05 DIAGNOSIS — I428 Other cardiomyopathies: Secondary | ICD-10-CM | POA: Diagnosis not present

## 2024-04-05 DIAGNOSIS — Z95810 Presence of automatic (implantable) cardiac defibrillator: Secondary | ICD-10-CM | POA: Insufficient documentation

## 2024-04-05 DIAGNOSIS — I5022 Chronic systolic (congestive) heart failure: Secondary | ICD-10-CM | POA: Insufficient documentation

## 2024-04-05 DIAGNOSIS — I447 Left bundle-branch block, unspecified: Secondary | ICD-10-CM | POA: Insufficient documentation

## 2024-04-05 LAB — BRAIN NATRIURETIC PEPTIDE: B Natriuretic Peptide: 205.9 pg/mL — ABNORMAL HIGH (ref 0.0–100.0)

## 2024-04-05 MED ORDER — SPIRONOLACTONE 25 MG PO TABS: 25.0000 mg | ORAL_TABLET | Freq: Every day | ORAL | 3 refills | Status: AC

## 2024-04-05 NOTE — Patient Instructions (Addendum)
 Good to see you today!  INCREASE Spironolactone   to 25 mg (1 tablet) daily  Labs done today, your results will be available in MyChart, we will contact you for abnormal readings.  Please follow up with our heart failure pharmacist as scheduled  Your physician recommends that you schedule a follow-up appointment  3 months (February)Call office in December to schedule an appointment  If you have any questions or concerns before your next appointment please send us  a message through [place] or call our office at [phone].    TO LEAVE A MESSAGE FOR THE NURSE SELECT OPTION 2, PLEASE LEAVE A MESSAGE INCLUDING: YOUR NAME DATE OF BIRTH CALL BACK NUMBER REASON FOR CALL**this is important as we prioritize the call backs  YOU WILL RECEIVE A CALL BACK THE SAME DAY AS LONG AS YOU CALL BEFORE 4:00 PM At the [facility], you and your health needs are our priority. As part of our continuing mission to provide you with exceptional heart care, we have created designated Provider Care Teams. These Care Teams include your primary Cardiologist (physician) and Advanced Practice Providers (APPs- Physician Assistants and Nurse Practitioners) who all work together to provide you with the care you need, when you need it.   You may see any of the following providers on your designated Care Team at your next follow up: Dr Toribio Fuel Dr Ezra Shuck Dr. Morene Brownie Greig Mosses, NP Caffie Shed, [place] [facility] Onward, [place] Beckey Coe, NP Jordan Lee, NP Ellouise Class, NP Tinnie Redman, PharmD Jaun Bash, PharmD   Please be sure to bring in all your medications bottles to every appointment.    Thank you for choosing [facility]

## 2024-04-05 NOTE — Progress Notes (Signed)
 Advanced Heart Failure Clinic Progress Note   Patient ID: Cynthia Roth, female   DOB: 1951-03-14, 73 y.o.   MRN: [id number] PCP: Mercer Clotilda SAUNDERS, MD HF Cardiology: Rolan  Chief complaint: CHF  Cynthia Roth is a 73 y.o. female with PMH of nonischemic cardiomyopathy referred by NP [facility] for evaluation of CHF.    Patient was admitted to [facility] in 7/13 with acute CHF.  No prior cardiac history.  No ETOH/Drug history.  No significant family history. No prior viral illness. TSH normal.    She had LBBB on EKG and her echo showed EF 15% with severely dilated LV and diastolic dysfunction. LHC showed no significant coronary disease.  Repeat echo in 10/13 on good medical therapy showed persistent EF 15%.  She had Medtronic CRT-D device placed in 12/13 by Dr. Fernande.  Followup echo in 3/14 showed persistently depressed LV systolic function with EF 20%.  CPX in 07/2013 showed mildly decreased functional capacity, suspect obesity played significant role in this.  Repeat echo in 1/17 showed EF up to 55-60%.  Echo in 10/22 showed EF 50-55%.     She was generally doing well until 7/25, when echo was done showing drop in EF back down to 20-25% with septal-lateral dyssynchrony, mild LV dilation, mild RV dysfunction/normal RV size.  She was seen by EP and it was noted that she had problems with her CS lead and had lost adequate BiV pacing. In 9/25, she had a left bundle lead placed for CRT.    Currently, she is doing well symptomatically.  Even before her left bundle lead was placed, she had minimal symptoms despite fall in EF.  Able to do basic ADLs w/o much significant exertional dyspnea. NYHA Class II. No chest pain. Reports compliance w/ medications and tolerating well. No side effects. BP 106/74. Device interrogation shows 99% BiV pacing.   ECG (personally reviewed): not performed.   Medtronic device interrogation: No AF/VT, 99% left bundle pacing.   Labs (8/25): K 4.7, creatinine  0.81 Labs (9/25): LDL 92  Allergies  Allergen Reactions   Decongestant [Pseudoephedrine Hcl Er]     Unknown reaction   Pseudoephedrine Other (See Comments)   Past Medical History: 1. CHF and Nonischemic cardiomyopathy: patient was admitted with acute CHF on 11/29/11 and Echo showed EF 15% with diffuse hypokinesis, severely dilated LV, severe diastolic dysfunction. Left heart cath showed no angiographic CAD.  TSH, SPEP, urine immunofixation were all within normal range.  Repeat echo 10/13 with EF 15%, diffuse hypokinesis.  Medtronic CRT-D device placed 12/13 Robyne).  Echo (3/14) with EF 20%, diffuse hypokinesis, mild to moderate MR, normal RV. CPX (3/15) with peak VO2 13.4 adjusted to 21 for ideal body weight, VE/VCO2 slope 31, RER 1.09, mildly decreased functional capacity, suspect obesity plays a role.  - Echo (1/17): EF improved to 55-60% sp CRT placement.  - Echo (10/22): EF 50-55% - Echo (7/25): EF back down to 20-25% with septal-lateral dyssynchrony, mild LV dilation, mild RV dysfunction/normal RV size. - CS lead noted to be not functioning properly, left bundle lead placed in 9/25 for CRT.  2. LBBB: noted on hospital admission on 11/29/11. No old EKG. 3. HTN 4. Dyslipidemia 5. Tubular adenoma:  Last colonoscopy in 1999. Followed by GI Dr. Kristie.  Family History  Problem Relation Age of Onset   Angina Mother        diagnosed at age of 45's, unsure whether it was a true diagnosis. unsure about the treatment  Dementia Mother    Sleep apnea Brother    Kidney disease Father    Diabetes Father    Diabetes Brother    Heart failure Maternal Grandmother        diagnosed in age of 55's. still living at age of 25's.   Heart attack Maternal Grandmother    Stroke Maternal Grandmother    Breast cancer Maternal Grandmother    Colon cancer Maternal Grandmother    Colon cancer Other        family history   Intellectual disability Other    Hypertension Neg Hx     Social History    Socioeconomic History   Marital status: Divorced    Spouse name: n/a   Number of children: 1   Years of education: Master's   Highest education level: Not on file  Occupational History   Occupation: Warehouse Manager  Tobacco Use   Smoking status: Never   Smokeless tobacco: Never  Vaping Use   Vaping status: Never Used  Substance and Sexual Activity   Alcohol use: No    Alcohol/week: 0.0 standard drinks of alcohol   Drug use: No   Sexual activity: Never  Other Topics Concern   Not on file  Social History Narrative   Lives in [place], and her mother, grandmother and one brother live with her. Divorced with one adult son who lives in [place], [place]. One brother lives in [place], [place]. Another lives in [place]. 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   Social Drivers of Corporate Investment Banker Strain: Low Risk  (04/16/2021)   Overall Financial Resource Strain (CARDIA)    Difficulty of Paying Living Expenses: Not hard at all  Food Insecurity: No Food Insecurity (02/16/2024)   Hunger Vital Sign    Worried About Running Out of Food in the Last Year: Never true    Ran Out of Food in the Last Year: Never true  Transportation Needs: No Transportation Needs (02/16/2024)   PRAPARE - Administrator, Civil Service (Medical): No    Lack of Transportation (Non-Medical): No  Physical Activity: Insufficiently Active (04/16/2021)   Exercise Vital Sign    Days of Exercise per Week: 5 days    Minutes of Exercise per Session: 20 min  Stress: No Stress Concern Present (04/16/2021)   [employer] of Occupational Health - Occupational Stress Questionnaire    Feeling of Stress : Not at all  Social Connections: Unknown (02/16/2024)   Social Connection and Isolation Panel    Frequency of Communication with Friends and Family: More than three times a week    Frequency of Social Gatherings with Friends and  Family: More than three times a week    Attends Religious Services: Not on file    Active Member of Clubs or Organizations: Not on file    Attends Banker Meetings: Not on file    Marital Status: Not on file  Intimate Partner Violence: Unknown (02/16/2024)   Humiliation, Afraid, Rape, and Kick questionnaire    Fear of Current or Ex-Partner: No    Emotionally Abused: No    Physically Abused: Not on file    Sexually Abused: Not on file    Current Outpatient Medications on File Prior to Visit  Medication Sig Dispense Refill   acetaminophen  (TYLENOL ) 500 MG tablet Take 500 mg by mouth at bedtime as needed for mild pain (pain score 1-3)  or moderate pain (pain score 4-6).     atorvastatin  (LIPITOR) 40 MG tablet Take 1 tablet (40 mg total) by mouth daily. 90 tablet 3   cephALEXin (KEFLEX) 500 MG capsule TAKE 1 CAPSULE BY MOUTH TWICE A DAY 14 capsule 0   ENTRESTO  24-26 MG Take 1 tablet by mouth 2 (two) times daily. 180 tablet 3   fexofenadine  (ALLEGRA ) 180 MG tablet TAKE 1 TABLET BY MOUTH EVERY DAY 90 tablet 1   furosemide  (LASIX ) 20 MG tablet TAKE 1 TABLET BY MOUTH EVERY OTHER DAY (Patient taking differently: Take 20 mg by mouth every other day.) 90 tablet 1   metoprolol  succinate (TOPROL -XL) 25 MG 24 hr tablet Take 1 tablet (25 mg total) by mouth daily. 90 tablet 3   spironolactone  (ALDACTONE ) 25 MG tablet Take 0.5 tablets (12.5 mg total) by mouth daily. 45 tablet 3   No current facility-administered medications on file prior to visit.   There were no vitals filed for this visit.  Wt Readings from Last 3 Encounters:  03/08/24 86.3 kg (190 lb 3.2 oz)  02/23/24 87.7 kg (193 lb 6.4 oz)  02/15/24 86.6 kg (191 lb)    Physical Exam  GENERAL: NAD Lungs- clear  CARDIAC:  JVP not elevated         Normal rate with regular rhythm. No MRG. No LEE  ABDOMEN: Soft, non-tender, non-distended.  EXTREMITIES: Warm and well perfused.  NEUROLOGIC: No obvious FND   Assessment/Plan:  1.  Chronic systolic heart failure: Nonischemic cardiomyopathy found in 2013, s/p Medtronic CRT-D.  Possible LBBB cardiomyopathy. Echo in 1/17 showed EF improved to 55-60% and echo in 10/22 showed EF 50-55%.  Echo in 7/25, however, showed drop in EF to 20-25% with septal-lateral dyssynchrony, mild LV dilation, mild RV dysfunction/normal RV size.  CS lead was not working appropriately and she had lost CRT.  In 9/25, left bundle lead was placed for CRT and she is pacing appropriately on interrogation today, 99%. NYHA Class I-II. Euvolemic on exam.  I hope that her LV function will improve now that she again has effective CRT.  Will continue to push GDMT as well.  - Increase Spiro to 25 mg daily  - Continue Toprol  XL 25 mg daily.  - Continue Entresto  24/26 bid. BP too soft for titration today  - Continue lasix  20 mg daily  - check BMP and BNP today  - she is arranged to have cardiac MRI in 3 months after medication titration assuming her device is MRI compatible.  2. LBBB: s/p CRT-D placement with improvement of EF initially then drop in EF with loss of CRT.  3. HTN: controlled on current regimen - GDMT per above   Followup in 4 wks with HF pharmacist for med titration, F/u w/ Dr. Rolan in 3 months.    Caffie Shed, PA-C  04/05/2024

## 2024-04-06 ENCOUNTER — Ambulatory Visit: Payer: Self-pay

## 2024-04-06 NOTE — Progress Notes (Signed)
 Remote ICD transmission.

## 2024-04-08 ENCOUNTER — Ambulatory Visit: Payer: Self-pay | Admitting: Cardiology

## 2024-04-09 ENCOUNTER — Ambulatory Visit

## 2024-04-09 DIAGNOSIS — I5022 Chronic systolic (congestive) heart failure: Secondary | ICD-10-CM | POA: Diagnosis not present

## 2024-04-09 LAB — CUP PACEART REMOTE DEVICE CHECK
Battery Remaining Longevity: 116 mo
Battery Voltage: 3.07 V
Brady Statistic AP VP Percent: 0.04 %
Brady Statistic AP VS Percent: 0 %
Brady Statistic AS VP Percent: 98.5 %
Brady Statistic AS VS Percent: 1.46 %
Brady Statistic RA Percent Paced: 0.06 %
Brady Statistic RV Percent Paced: 98.54 %
Date Time Interrogation Session: 20251109220901
HighPow Impedance: 92 Ohm
Implantable Lead Connection Status: 753985
Implantable Lead Connection Status: 753985
Implantable Lead Connection Status: 753985
Implantable Lead Implant Date: 20131211
Implantable Lead Implant Date: 20131211
Implantable Lead Implant Date: 20131211
Implantable Lead Location: 753858
Implantable Lead Location: 753859
Implantable Lead Location: 753860
Implantable Lead Model: 181
Implantable Lead Model: 4396
Implantable Lead Model: 5076
Implantable Lead Serial Number: 32342
Implantable Pulse Generator Implant Date: 20250917
Lead Channel Impedance Value: 361 Ohm
Lead Channel Impedance Value: 399 Ohm
Lead Channel Impedance Value: 475 Ohm
Lead Channel Impedance Value: 532 Ohm
Lead Channel Impedance Value: 532 Ohm
Lead Channel Impedance Value: 874 Ohm
Lead Channel Pacing Threshold Amplitude: 0.375 V
Lead Channel Pacing Threshold Amplitude: 0.625 V
Lead Channel Pacing Threshold Pulse Width: 0.4 ms
Lead Channel Pacing Threshold Pulse Width: 0.4 ms
Lead Channel Sensing Intrinsic Amplitude: 13 mV
Lead Channel Sensing Intrinsic Amplitude: 3.6 mV
Lead Channel Setting Pacing Amplitude: 1.5 V
Lead Channel Setting Pacing Amplitude: 3.5 V
Lead Channel Setting Pacing Pulse Width: 0.4 ms
Lead Channel Setting Sensing Sensitivity: 0.3 mV
Zone Setting Status: 755011
Zone Setting Status: 755011

## 2024-04-12 NOTE — Progress Notes (Signed)
 Remote ICD Transmission

## 2024-04-13 ENCOUNTER — Ambulatory Visit: Payer: Self-pay | Admitting: Cardiology

## 2024-04-19 ENCOUNTER — Encounter: Payer: Self-pay | Admitting: Cardiology

## 2024-05-09 NOTE — Progress Notes (Incomplete)
 Advanced Heart Failure Clinic Note  PCP: Mercer Clotilda SAUNDERS, MD PCP-Cardiologist: Annabella Scarce, MD HF-Cardiologist: Ezra Shuck, MD  HPI:  Cynthia Roth is a 73 y.o. female with PMH of nonischemic cardiomyopathy referred by NP [facility] for evaluation of CHF.     Patient was admitted to [facility] in 11/2011 with acute CHF.  No prior cardiac history.  No ETOH/Drug history.  No significant family history. No prior viral illness. TSH normal.     She had LBBB on EKG and her echo showed EF 15% with severely dilated LV and diastolic dysfunction. LHC showed no significant coronary disease.  Repeat echo in 02/2012 on good medical therapy showed persistent EF 15%.  She had Medtronic CRT-D device placed in 04/2012 by Dr. Fernande.  Followup echo in 07/2012 showed persistently depressed LV systolic function with EF 20%.  CPX in 07/2013 showed mildly decreased functional capacity, suspect obesity played significant role in this.  Repeat echo in 06/2015 showed EF up to 55-60%.  Echo in 02/2021 showed EF 50-55%.      She was generally doing well until 11/2023, when echo was done showing drop in EF back down to 20-25% with septal-lateral dyssynchrony, mild LV dilation, mild RV dysfunction/normal RV size.  She was seen by EP and it was noted that she had problems with her CS lead and had lost adequate BiV pacing.  In 01/2024, she had a left bundle lead placed for CRT.     Seen 03/15/24 by HF pharmacy where spironolactone  12.5 mg daily was started.  Last seen by CHF clinic on 04/05/24 where spironolactone  was increased to 25 mg daily.   Today Cynthia Roth returns to Heart Failure Clinic for pharmacist medication titration. Reports feeling excellent overall. Denies fatigue, chest pain, palpitations, SOB, LEE, orthopnea, orthostasis, and PND. Reports being able to complete all activities of daily living (ADLs). Is not very active throughout the day, but wants to start walking soon. Weight is not measured  at home. Takes furosemide  20 mg every other day. Appetite has been poor. Does follow a low sodium diet.  Past Medical History: 1. CHF and Nonischemic cardiomyopathy: patient was admitted with acute CHF on 11/29/11 and Echo showed EF 15% with diffuse hypokinesis, severely dilated LV, severe diastolic dysfunction. Left heart cath showed no angiographic CAD.  TSH, SPEP, urine immunofixation were all within normal range.  Repeat echo 02/2012 with EF 15%, diffuse hypokinesis.  Medtronic CRT-D device placed 04/2012 Robyne).  Echo (07/2012) with EF 20%, diffuse hypokinesis, mild to moderate MR, normal RV. CPX (07/2013) with peak VO2 13.4 adjusted to 21 for ideal body weight, VE/VCO2 slope 31, RER 1.09, mildly decreased functional capacity, suspect obesity plays a role.  - Echo (06/2015): EF improved to 55-60% sp CRT placement.  - Echo (02/2021): EF 50-55% - Echo (11/2023): EF back down to 20-25% with septal-lateral dyssynchrony, mild LV dilation, mild RV dysfunction/normal RV size. - CS lead noted to be not functioning properly, left bundle lead placed in 9/25 for CRT.  2. LBBB: noted on hospital admission on 11/29/11. No old EKG. 3. HTN 4. Dyslipidemia 5. Tubular adenoma:  Last colonoscopy in 1999. Followed by GI Dr. Kristie.  Current Heart Failure Medications: Loop diuretic: furosemide  20 mg every other day Beta-Blocker: metoprolol  succinate 25 mg daily ACEI/ARB/ARNI: Entresto  24-26 mg BID MRA: none SGLT2i: none Other: none  Has the patient been experiencing any side effects to the medications prescribed? No  Does the patient have any problems obtaining medications due to  transportation or finances? No  Understanding of regimen: Poor  Understanding of indications: Poor  Potential of adherence: Fair  Patient understands to avoid NSAIDs.  Patient understands to avoid decongestants.  Pertinent Lab Values: Creat  Date Value Ref Range Status  02/27/2020 1.46 (H) 0.50 - 0.99 mg/dL Final     Comment:    For patients >52 years of age, the reference limit for Creatinine is approximately 13% higher for people identified as African-American. .    Creatinine, Ser  Date Value Ref Range Status  03/15/2024 0.95 0.44 - 1.00 mg/dL Final   BUN  Date Value Ref Range Status  03/15/2024 8 8 - 23 mg/dL Final  91/78/7974 12 8 - 27 mg/dL Final   Potassium  Date Value Ref Range Status  03/15/2024 3.7 3.5 - 5.1 mmol/L Final   Sodium  Date Value Ref Range Status  03/15/2024 144 135 - 145 mmol/L Final  01/19/2024 144 134 - 144 mmol/L Final   B Natriuretic Peptide  Date Value Ref Range Status  04/05/2024 205.9 (H) 0.0 - 100.0 pg/mL Final    Comment:    Performed at [facility], [address], [place], [place] [address]   Magnesium   Date Value Ref Range Status  05/10/2012 1.7 1.5 - 2.5 mg/dL Final   TSH  Date Value Ref Range Status  04/26/2022 1.71 0.35 - 5.50 uIU/mL Final    Vital Signs: There were no vitals filed for this visit.   Assessment/Plan: 1. Chronic systolic heart failure: Nonischemic cardiomyopathy found in 2013, s/p Medtronic CRT-D.  Possible LBBB cardiomyopathy. Echo in 06/2015 showed EF improved to 55-60% and echo in 02/2021 showed EF 50-55%.  Echo in 11/2023, however, showed drop in EF to 20-25% with septal-lateral dyssynchrony, mild LV dilation, mild RV dysfunction/normal RV size.  CS lead was not working appropriately and she had lost CRT.  In 01/2024, left bundle lead was placed for CRT and she is pacing appropriately last interrogation.  NYHA class I, not volume overloaded on exam.  It is unclear why GDMT was stopped, currently in the titration phase. Hopefully her LV function will improve now that she again has effective CRT.   - Continue Toprol  XL 25 mg daily.  - She has been on Lasix  20 mg daily, can continue for now. Patient notified to reach out if she experiences symptoms of hypovolemia reviewed today.  - Continue Entresto  24/26 bid.  BMET/BNP  today - Start spironolactone  12.5 mg daily and return for labs in 1 week - Patient hesitant to start SGLT2i today, will consider at next visit. No recent history of UTIs - MD to arrange for cardiac MRI in 3 months after medication titration assuming her device is MRI compatible.  2. LBBB: s/p CRT-D placement with improvement of EF initially then drop in EF with loss of CRT.  3. HTN: Meds as above.     Follow up: 2 wks with HF pharmacist for med titration and HF APP in 6 wks.   Please do not hesitate to reach out with questions or concerns,  Jaun Bash, PharmD, CPP, BCPS, [facility] Heart Failure Pharmacist  Phone - [phone] 05/09/2024 4:02 PM

## 2024-05-10 ENCOUNTER — Ambulatory Visit (HOSPITAL_COMMUNITY)

## 2024-05-15 NOTE — Progress Notes (Unsigned)
°  Electrophysiology Office Note:   Date:  05/16/2024  ID:  Roanne, Haye June 23, 1950, MRN [id number]  Primary Cardiologist: Annabella Scarce, MD Primary Heart Failure: None Electrophysiologist: Fonda Kitty, MD       History of Present Illness:   Cynthia Roth is a 73 y.o. female with h/o NICM / HFrEF s/p CRT-D, HTN, HLD, LBBB, GERD, obesty  seen today for routine electrophysiology followup.   Patient underwent LBA lead insertion in 01/2024 for non-functioning CS lead.    Since last being seen in our clinic the patient reports doing well overall. She had no trouble with her battery change, device healed well.   She denies chest pain, palpitations, dyspnea, PND, orthopnea, nausea, vomiting, dizziness, syncope, edema, weight gain, or early satiety.   Review of systems complete and found to be negative unless listed in HPI.    EP Information / Studies Reviewed:    EKG is not ordered today. EKG from 02/23/24 reviewed which showed ASVP 91 bpm       ICD Interrogation-  reviewed in detail today,  See PACEART report.  Device History: Medtronic BiV ICD implanted 05/10/12 for NICM / HFrEF. LBA lead placed 01/2024, paced / sense component of RV ICD lead capped. Has Mixed Lead system with GORE LEAD. CS lead programmed off (non-functional) Generator Change > 02/15/24  History of appropriate therapy: No History of AAD therapy: No   Risk Assessment/Calculations:              Physical Exam:   VS:  BP 104/60   Pulse 84   Ht 5' 3 (1.6 m)   Wt 187 lb (84.8 kg)   SpO2 97%   BMI 33.13 kg/m    Wt Readings from Last 3 Encounters:  05/16/24 187 lb (84.8 kg)  04/05/24 182 lb 3.2 oz (82.6 kg)  03/08/24 190 lb 3.2 oz (86.3 kg)     GEN: Well nourished, well developed in no acute distress NECK: No JVD; No carotid bruits CARDIAC: Regular rate and rhythm, no murmurs, rubs, gallops, device site well healed, no tethering RESPIRATORY:  Clear to auscultation without rales,  wheezing or rhonchi  ABDOMEN: Soft, non-tender, non-distended EXTREMITIES:  LE ankle 1+ edema; No deformity   ASSESSMENT AND PLAN:    Chronic Systolic Dysfunction / NICM s/p Medtronic CRT-D  LBBB LBA lead placed 01/2024 due to CS not functioning properly. Pace / sense component of RV ICD lead capped. Mixed lead system with GORE lead.  -euvolemic on exam / by device   -Stable on an appropriate medical regimen / referred to AHF Team  -RV ICD impedance stable  -Normal ICD function -See Pace Art report -No changes today  Hypertension  -well controlled on current regimen    CKD  -per PCP   Disposition:   Follow up with Dr. Kitty in 12 months   Signed, Daphne Barrack, NP-C, AGACNP-BC [facility] - Electrophysiology  05/16/2024, 5:35 PM

## 2024-05-16 ENCOUNTER — Encounter: Payer: Self-pay | Admitting: Pulmonary Disease

## 2024-05-16 ENCOUNTER — Ambulatory Visit: Attending: Cardiovascular Disease | Admitting: Pulmonary Disease

## 2024-05-16 VITALS — BP 104/60 | HR 84 | Ht 63.0 in | Wt 187.0 lb

## 2024-05-16 DIAGNOSIS — I10 Essential (primary) hypertension: Secondary | ICD-10-CM | POA: Diagnosis not present

## 2024-05-16 DIAGNOSIS — I447 Left bundle-branch block, unspecified: Secondary | ICD-10-CM

## 2024-05-16 DIAGNOSIS — Z95810 Presence of automatic (implantable) cardiac defibrillator: Secondary | ICD-10-CM

## 2024-05-16 DIAGNOSIS — I428 Other cardiomyopathies: Secondary | ICD-10-CM

## 2024-05-16 DIAGNOSIS — I5022 Chronic systolic (congestive) heart failure: Secondary | ICD-10-CM

## 2024-05-16 LAB — CUP PACEART INCLINIC DEVICE CHECK
Date Time Interrogation Session: 20251217173603
Implantable Lead Connection Status: 753985
Implantable Lead Connection Status: 753985
Implantable Lead Connection Status: 753985
Implantable Lead Implant Date: 20131211
Implantable Lead Implant Date: 20131211
Implantable Lead Implant Date: 20131211
Implantable Lead Location: 753858
Implantable Lead Location: 753859
Implantable Lead Location: 753860
Implantable Lead Model: 181
Implantable Lead Model: 4396
Implantable Lead Model: 5076
Implantable Lead Serial Number: 32342
Implantable Pulse Generator Implant Date: 20250917

## 2024-05-16 NOTE — Patient Instructions (Signed)
 Medication Instructions:  No medications changes today   *If you need a refill on your cardiac medications before your next appointment, please call your pharmacy*  Lab Work: No lab work today If you have labs (blood work) drawn today and your tests are completely normal, you will receive your results only by: MyChart Message (if you have MyChart) OR A paper copy in the mail If you have any lab test that is abnormal or we need to change your treatment, we will call you to review the results.  Testing/Procedures: No testing/procedures were scheduled today  Follow-Up: At [facility], you and your health needs are our priority.  As part of our continuing mission to provide you with exceptional heart care, our providers are all part of one team.  This team includes your primary Cardiologist (physician) and Advanced Practice Providers or APPs (Physician Assistants and Nurse Practitioners) who all work together to provide you with the care you need, when you need it.  Your next appointment:   12 month(s).  Call in November of 2026 to make an appt with EP Team below for your device check.   Provider:   You may see Fonda Kitty, MD or one of the following Advanced Practice Providers on your designated Care Team:    Daphne Barrack, NP    We recommend signing up for the patient portal called MyChart.  Sign up information is provided on this After Visit Summary.  MyChart is used to connect with patients for Virtual Visits (Telemedicine).  Patients are able to view lab/test results, encounter notes, upcoming appointments, etc.  Non-urgent messages can be sent to your provider as well.   To learn more about what you can do with MyChart, go to [email or web address].

## 2024-05-17 ENCOUNTER — Ambulatory Visit: Payer: Self-pay | Admitting: Cardiology

## 2024-06-01 ENCOUNTER — Other Ambulatory Visit: Payer: Self-pay | Admitting: Family Medicine

## 2024-06-01 DIAGNOSIS — J302 Other seasonal allergic rhinitis: Secondary | ICD-10-CM

## 2024-06-05 NOTE — Telephone Encounter (Signed)
 Patient needs a Cpe sch

## 2024-06-05 NOTE — Telephone Encounter (Signed)
 Called patient to set up follow-up appt per last visit office note. Unable to leave VM

## 2024-06-07 ENCOUNTER — Telehealth: Payer: Self-pay | Admitting: *Deleted

## 2024-06-07 NOTE — Telephone Encounter (Signed)
 Copied from CRM [id number]. Topic: Clinical - Request for Lab/Test Order >> Jun 07, 2024 11:38 AM Robinson H wrote: Reason for CRM: Patients son wants patient to have fasting labs prior to physical appointment scheduled late on 1/21 at 3:00 pm.  Call patients son so he can coordinate appointment W[place] [phone]

## 2024-06-07 NOTE — Telephone Encounter (Signed)
 Spoke with son per DPR, patient will have labs the same day as Cpe or after Cpe Per Dr. Mercer

## 2024-06-20 ENCOUNTER — Ambulatory Visit: Admitting: Family Medicine

## 2024-06-20 ENCOUNTER — Other Ambulatory Visit: Payer: Self-pay | Admitting: Family Medicine

## 2024-06-20 VITALS — BP 114/70 | Temp 97.9°F | Ht 63.0 in | Wt 185.2 lb

## 2024-06-20 DIAGNOSIS — R413 Other amnesia: Secondary | ICD-10-CM

## 2024-06-20 DIAGNOSIS — Z13220 Encounter for screening for lipoid disorders: Secondary | ICD-10-CM

## 2024-06-20 DIAGNOSIS — Z0001 Encounter for general adult medical examination with abnormal findings: Secondary | ICD-10-CM

## 2024-06-20 DIAGNOSIS — Z1231 Encounter for screening mammogram for malignant neoplasm of breast: Secondary | ICD-10-CM

## 2024-06-20 DIAGNOSIS — R4789 Other speech disturbances: Secondary | ICD-10-CM | POA: Diagnosis not present

## 2024-06-20 MED ORDER — FUROSEMIDE 20 MG PO TABS: 20.0000 mg | ORAL_TABLET | ORAL | 1 refills | Status: AC

## 2024-06-20 NOTE — Progress Notes (Signed)
 "  Established Patient Office Visit   Subjective  Patient ID: Cynthia Roth, female    DOB: [birth year]  Age: 74 y.o. MRN: [id number]  Chief Complaint  Patient presents with   Annual Exam  Patient accompanied by her son, Randalyn Ahmed, [phone], who has POA/HCPOA forms.  Patient is a 74 year old female seen for CPE and follow-up.  Pt summary notes concerns for increased word finding x 1 year or more.  Pt has difficulty finding words for common items and completing sentences. Her son notes that she has trouble describing things over the phone. There is a family history of dementia, as both her mother and grandmother had the condition.  Concerns regarding patient's memory.  Pt son Wadie and attorney in [place] her HCPOA/POA forms and into the chart.  Would like to be contacted regarding scheduling appointments for patient.  Not checking BP at home.  Pt states she may eat once/ day.  Prior to this 3 pm appt, pt has only had half of a chocolate protein shake. Pt spends a lot of time in her room reading and doing puzzles.  She visits the park daily except Sundays when she attends church. Pt has a h/o anxiety.  Has declined medication in the past.   Pt with sinus drainage.  Prescribed Allegra , though it is unclear if she takes it regularly.   Her son mentions that she has a tendency to help others, sometimes to her detriment, and has been emotionally affected by this. She has been taking a friend's son, who is unhoused, to church and feels responsible for helping him, despite acknowledging that it is not her responsibility.   Pt has also over extending herself by trying to take care of the friend financially who is currently in the custody of the state.  She expresses feelings of sadness and frustration about her inability to help everyone and has cried over these situations.  Pt states she would like to schedule a mammogram.    Patient Active Problem List   Diagnosis Date  Noted   Dysphagia 06/06/2023   Gastroesophageal reflux disease 06/06/2023   History of colonic polyps 06/06/2023   Morbid (severe) obesity due to excess calories (HCC) 06/06/2023   Diverticulosis of large intestine without perforation or abscess without bleeding 06/06/2023   CAD in native artery 02/22/2021   Obesity (BMI 30.0-34.9) 06/11/2020   Prediabetes 03/01/2020   Biventricular implantable cardioverter-defibrillator in situ    Nonischemic cardiomyopathy (HCC) 03/28/2012   Chronic systolic heart failure (HCC) 01/05/2012   HTN (hypertension) 12/02/2011   Dyslipidemia 12/02/2011   LBBB (left bundle branch block) 12/02/2011   Past Medical History:  Diagnosis Date   AICD (automatic cardioverter/defibrillator) present    Allergy    Biventricular ICD -Medtronic    DOI 12/13 - Medtronic Viva XR CRT-D defibrillator, serial #[id number].     CAD in native artery 02/22/2021   CHF (congestive heart failure) (HCC)    Chronic systolic heart failure (HCC) 12/02/2011   Colon polyps    Dr Renaye Mann-GI   Dyslipidemia 12/02/2011   Gout    very seldom (05/10/2012)   Hx of colonic polyps    Hyperlipidemia    Hypertension    used to have this; now my BP is low (05/10/2012)   LBBB (left bundle branch block) 12/02/2011   Nonischemic cardiomyopathy (HCC)    Obesity (BMI 30.0-34.9) 06/11/2020   Pre-diabetes    Prediabetes    Presence of permanent cardiac pacemaker  Tubular adenoma 1999   history of   Past Surgical History:  Procedure Laterality Date   BI-VENTRICULAR IMPLANTABLE CARDIOVERTER DEFIBRILLATOR N/A 05/10/2012   Procedure: BI-VENTRICULAR IMPLANTABLE CARDIOVERTER DEFIBRILLATOR  (CRT-D);  Surgeon: Elspeth JAYSON Sage, MD;  Location: [facility] CATH LAB;  Service: Cardiovascular;  Laterality: N/A;   BIV ICD GENERATOR CHANGEOUT N/A 12/22/2018   Procedure: BIV ICD GENERATOR CHANGEOUT;  Surgeon: Sage Elspeth JAYSON, MD;  Location: [facility] INVASIVE CV LAB;  Service: Cardiovascular;  Laterality: N/A;   BIV  ICD GENERATOR CHANGEOUT N/A 02/15/2024   Procedure: BIV ICD GENERATOR CHANGEOUT;  Surgeon: Kennyth Chew, MD;  Location: [facility] INVASIVE CV LAB;  Service: Cardiovascular;  Laterality: N/A;   CARDIAC CATHETERIZATION  12-03-11   [facility]ie coronary angiography   CARDIAC DEFIBRILLATOR PLACEMENT  05/10/2012   CRT-D implantation (05/10/2012)   COLONOSCOPY WITH PROPOFOL  N/A 03/17/2017   Procedure: COLONOSCOPY WITH PROPOFOL ;  Surgeon: Kristie Lamprey, MD;  Location: [facility] ENDOSCOPY;  Service: Endoscopy;  Laterality: N/A;   LEAD INSERTION N/A 02/15/2024   Procedure: LEAD INSERTION;  Surgeon: Kennyth Chew, MD;  Location: [facility] INVASIVE CV LAB;  Service: Cardiovascular;  Laterality: N/A;   LEFT HEART CATHETERIZATION WITH CORONARY ANGIOGRAM N/A 12/03/2011   Procedure: LEFT HEART CATHETERIZATION WITH CORONARY ANGIOGRAM;  Surgeon: Ezra GORMAN Shuck, MD;  Location: [facility] CATH LAB;  Service: Cardiovascular;  Laterality: N/A;   PARTIAL HYSTERECTOMY  1980's   Social History[1] Family History  Problem Relation Age of Onset   Angina Mother        diagnosed at age of 32's, unsure whether it was a true diagnosis. unsure about the treatment   Dementia Mother    Sleep apnea Brother    Kidney disease Father    Diabetes Father    Diabetes Brother    Heart failure Maternal Grandmother        diagnosed in age of 64's. still living at age of 67's.   Heart attack Maternal Grandmother    Stroke Maternal Grandmother    Breast cancer Maternal Grandmother    Colon cancer Maternal Grandmother    Colon cancer Other        family history   Intellectual disability Other    Hypertension Neg Hx    Allergies[2]  ROS Negative unless stated above    Objective:     BP 114/70   Temp 97.9 F (36.6 C)   Ht 5' 3 (1.6 m)   Wt 185 lb 3.2 oz (84 kg)   SpO2 98%   BMI 32.81 kg/m  BP Readings from Last 3 Encounters:  06/20/24 114/70  05/16/24 104/60  04/05/24 106/74   Wt Readings from Last 3 Encounters:  06/20/24 185 lb 3.2 oz (84  kg)  05/16/24 187 lb (84.8 kg)  04/05/24 182 lb 3.2 oz (82.6 kg)      Physical Exam Constitutional:      Appearance: Normal appearance.  HENT:     Head: Normocephalic and atraumatic.     Right Ear: Tympanic membrane, ear canal and external ear normal.     Left Ear: Tympanic membrane, ear canal and external ear normal.     Nose: Nose normal.     Mouth/Throat:     Mouth: Mucous membranes are moist.     Pharynx: No oropharyngeal exudate or posterior oropharyngeal erythema.  Eyes:     General: No scleral icterus.    Extraocular Movements: Extraocular movements intact.     Conjunctiva/sclera: Conjunctivae normal.     Pupils: Pupils are equal, round, and  reactive to light.  Neck:     Thyroid : No thyromegaly.     Vascular: No carotid bruit.  Cardiovascular:     Rate and Rhythm: Normal rate and regular rhythm.     Pulses: Normal pulses.     Heart sounds: Normal heart sounds. No murmur heard.    No friction rub.  Pulmonary:     Effort: Pulmonary effort is normal.     Breath sounds: Normal breath sounds. No wheezing, rhonchi or rales.  Abdominal:     General: Bowel sounds are normal.     Palpations: Abdomen is soft.     Tenderness: There is no abdominal tenderness.  Musculoskeletal:        General: No deformity. Normal range of motion.  Lymphadenopathy:     Cervical: No cervical adenopathy.  Skin:    General: Skin is warm and dry.     Findings: No lesion.  Neurological:     General: No focal deficit present.     Mental Status: She is alert and oriented to person, place, and time.  Psychiatric:        Mood and Affect: Mood normal.        Thought Content: Thought content normal.        06/20/2024    3:09 PM 05/02/2023    2:54 PM 04/26/2022   11:01 AM  Depression screen PHQ 2/9  Decreased Interest 3 0   Down, Depressed, Hopeless  0 1  PHQ - 2 Score 3 0 1  Altered sleeping 1 0 0  Tired, decreased energy 0 0 3  Change in appetite 1 0 1  Feeling bad or failure about  yourself  1 0 1  Trouble concentrating 0 0 0  Moving slowly or fidgety/restless 0 0 0  Suicidal thoughts 0 0 0  PHQ-9 Score 6 0  6   Difficult doing work/chores Somewhat difficult       Data saved with a previous flowsheet row definition      06/20/2024    3:09 PM 05/02/2023    2:54 PM 09/03/2019    4:47 PM  GAD 7 : Generalized Anxiety Score  Nervous, Anxious, on Edge 0 0  0   Control/stop worrying 0 0  0   Worry too much - different things 1 0  2   Trouble relaxing 0 0  0   Restless 0 0  0   Easily annoyed or irritable 0  0   Afraid - awful might happen 0 0  1   Total GAD 7 Score 1  3  Anxiety Difficulty Somewhat difficult       Data saved with a previous flowsheet row definition     No results found for any visits on 06/20/24.    Assessment & Plan:   Encounter for routine adult health examination with abnormal findings -     CBC with Differential/Platelet; Future -     Comprehensive metabolic panel with GFR; Future -     TSH; Future -     Lipid panel; Future -     Hemoglobin A1c; Future -     Folate; Future -     Vitamin B12; Future -     T4, free; Future  Word finding difficulty -     CBC with Differential/Platelet; Future -     Comprehensive metabolic panel with GFR; Future -     TSH; Future -     Hemoglobin A1c; Future -  Folate; Future -     Vitamin B12; Future -     CT HEAD WO CONTRAST ( ); Future -     T4, free; Future -     Ambulatory referral to Neuropsychology  Memory change -     CBC with Differential/Platelet; Future -     Comprehensive metabolic panel with GFR; Future -     TSH; Future -     Hemoglobin A1c; Future -     Folate; Future -     Vitamin B12; Future -     CT HEAD WO CONTRAST ( ); Future -     T4, free; Future -     Ambulatory referral to Neuropsychology  Encounter for screening mammogram for malignant neoplasm of breast -     3D Screening Mammogram, Left and Right; Future   age-appropriate health screenings discussed.   Obtain labs.  Immunizations reviewed.  Screening mammogram order placed.  Colonoscopy up-to-date done 03/17/2017.  PHQ-9 score 6, GAD-7 score 1 this visit.  Patient again advised to consider counseling options.  Discussed concern for patient's change in memory and word finding.  Order for CT head placed to evaluate for causes such as CVA, chronic small vessel changes, etc. that may be contributing to symptoms.  Will also obtain labs to evaluate reversible causes.  Order for neuropsych testing placed.  Patient's son would like to be contacted in regards to setting up appointments for patient (dnw:Ajmmb Hitchcock, HCPOA/POA [phone]).  Copies of patient's HCPOA/POA to be placed in chart.  I personally spent a total of 43 minutes in the care of the patient today including preparing to see the patient, getting/reviewing separately obtained history, performing a medically appropriate exam/evaluation, counseling and educating, placing orders, referring and communicating with other health care professionals, documenting clinical information in the EHR, independently interpreting results, communicating results, and coordinating care.   Return in about 3 months (around 09/18/2024).   Clotilda JONELLE Single, MD       [1]  Social History Tobacco Use   Smoking status: Never   Smokeless tobacco: Never  Vaping Use   Vaping status: Never Used  Substance Use Topics   Alcohol use: No    Alcohol/week: 0.0 standard drinks of alcohol   Drug use: No  [2]  Allergies Allergen Reactions   Decongestant [Pseudoephedrine Hcl Er]     Unknown reaction   Pseudoephedrine Other (See Comments)   "

## 2024-06-21 ENCOUNTER — Ambulatory Visit
Admission: RE | Admit: 2024-06-21 | Discharge: 2024-06-21 | Disposition: A | Discharge status: Home / Self Care | Source: Ambulatory Visit | Attending: Family Medicine | Admitting: Family Medicine

## 2024-06-21 DIAGNOSIS — R413 Other amnesia: Secondary | ICD-10-CM

## 2024-06-21 DIAGNOSIS — R4789 Other speech disturbances: Secondary | ICD-10-CM

## 2024-06-21 LAB — TSH: TSH: 1.83 u[IU]/mL (ref 0.35–5.50)

## 2024-06-21 LAB — CBC WITH DIFFERENTIAL/PLATELET
Basophils Absolute: 0.1 K/uL (ref 0.0–0.1)
Basophils Relative: 0.9 % (ref 0.0–3.0)
Eosinophils Absolute: 0.2 K/uL (ref 0.0–0.7)
Eosinophils Relative: 3.5 % (ref 0.0–5.0)
HCT: 36.7 % (ref 36.0–46.0)
Hemoglobin: 12.5 g/dL (ref 12.0–15.0)
Lymphocytes Relative: 40.3 % (ref 12.0–46.0)
Lymphs Abs: 2.6 K/uL (ref 0.7–4.0)
MCHC: 34 g/dL (ref 30.0–36.0)
MCV: 94.1 fl (ref 78.0–100.0)
Monocytes Absolute: 0.6 K/uL (ref 0.1–1.0)
Monocytes Relative: 9.4 % (ref 3.0–12.0)
Neutro Abs: 3 K/uL (ref 1.4–7.7)
Neutrophils Relative %: 45.9 % (ref 43.0–77.0)
Platelets: 286 K/uL (ref 150.0–400.0)
RBC: 3.9 Mil/uL (ref 3.87–5.11)
RDW: 13.9 % (ref 11.5–15.5)
WBC: 6.6 K/uL (ref 4.0–10.5)

## 2024-06-21 LAB — VITAMIN B12: Vitamin B-12: 293 pg/mL (ref 211–911)

## 2024-06-21 LAB — HEMOGLOBIN A1C: Hgb A1c MFr Bld: 6 % (ref 4.6–6.5)

## 2024-06-21 LAB — COMPREHENSIVE METABOLIC PANEL WITH GFR
ALT: 9 U/L (ref 3–35)
AST: 15 U/L (ref 5–37)
Albumin: 4.3 g/dL (ref 3.5–5.2)
Alkaline Phosphatase: 64 U/L (ref 39–117)
BUN: 14 mg/dL (ref 6–23)
CO2: 29 meq/L (ref 19–32)
Calcium: 10 mg/dL (ref 8.4–10.5)
Chloride: 103 meq/L (ref 96–112)
Creatinine, Ser: 0.9 mg/dL (ref 0.40–1.20)
GFR: 63.8 mL/min
Glucose, Bld: 79 mg/dL (ref 70–99)
Potassium: 4.2 meq/L (ref 3.5–5.1)
Sodium: 140 meq/L (ref 135–145)
Total Bilirubin: 0.7 mg/dL (ref 0.2–1.2)
Total Protein: 7.5 g/dL (ref 6.0–8.3)

## 2024-06-21 LAB — LIPID PANEL
Cholesterol: 144 mg/dL (ref 28–200)
HDL: 42.1 mg/dL
LDL Cholesterol: 83 mg/dL (ref 10–99)
NonHDL: 101.6
Total CHOL/HDL Ratio: 3
Triglycerides: 91 mg/dL (ref 10.0–149.0)
VLDL: 18.2 mg/dL (ref 0.0–40.0)

## 2024-06-21 LAB — T4, FREE: Free T4: 0.96 ng/dL (ref 0.60–1.60)

## 2024-06-21 LAB — FOLATE: Folate: 6.9 ng/mL

## 2024-06-27 ENCOUNTER — Telehealth: Payer: Self-pay | Admitting: Family Medicine

## 2024-06-27 NOTE — Telephone Encounter (Signed)
 Copied from CRM #[id number]. Topic: Referral - Question >> Jun 27, 2024  3:06 PM Burnard DEL wrote: Reason for CRM: Patients son called in in regards to referral that was sent out to [facility] psychology. He stated that he contacted the office and they are with [facility] and the informed him that they aren't accepting outside referrals and haven't done so since November. He would like to know if referral could be sent to a [place] provider?

## 2024-06-28 ENCOUNTER — Other Ambulatory Visit

## 2024-06-28 ENCOUNTER — Ambulatory Visit: Payer: Self-pay | Admitting: Family Medicine

## 2024-07-03 ENCOUNTER — Encounter: Payer: Self-pay | Admitting: Family Medicine

## 2024-07-04 ENCOUNTER — Telehealth: Payer: Self-pay | Admitting: Family Medicine

## 2024-07-04 NOTE — Telephone Encounter (Signed)
 Copied from CRM #[id number]. Topic: Referral - Question >> Jun 27, 2024  3:06 PM Burnard DEL wrote: Reason for CRM: Patients son called in in regards to referral that was sent out to [facility] psychology. He stated that he contacted the office and they are with [facility] and the informed him that they aren't accepting outside referrals and haven't done so since November. He would like to know if referral could be sent to a [place] provider? >> Jul 04, 2024 12:41 PM Robinson H wrote: Patients son is following up on message sent regarding referral for his mom, states Dr. Felecia isn't accepting any referrals from outside providers wants to know if referral can be sent to a [facility] provider. States Rea responded to his message with the same information of Dr. Felecia.  Wadie [phone]

## 2024-07-04 NOTE — Telephone Encounter (Signed)
Called and left a vm to return call

## 2024-07-04 NOTE — Telephone Encounter (Signed)
 Called patient 's son and left a message to return call, and message has been sent to lakirah to change providers

## 2024-07-05 ENCOUNTER — Telehealth: Payer: Self-pay | Admitting: Family Medicine

## 2024-07-05 NOTE — Telephone Encounter (Signed)
 Copied from CRM #[id number]. Topic: General - Other >> Jul 04, 2024  4:55 PM Kevelyn M wrote: Reason for CRM: Patient's son calling back to speak to [facility]. Please call him back  At # [phone].Attempted to call to give this information.

## 2024-07-05 NOTE — Telephone Encounter (Signed)
 Spoke with patient's son per DPR, he is aware that Referral team is looking in to it and will contact him

## 2024-07-09 ENCOUNTER — Encounter

## 2024-07-12 ENCOUNTER — Ambulatory Visit

## 2024-07-16 ENCOUNTER — Ambulatory Visit (HOSPITAL_BASED_OUTPATIENT_CLINIC_OR_DEPARTMENT_OTHER): Admitting: Cardiovascular Disease

## 2024-10-08 ENCOUNTER — Encounter

## 2025-01-07 ENCOUNTER — Encounter
